# Patient Record
Sex: Female | Born: 1960 | Race: White | Hispanic: No | Marital: Married | Smoking: Never smoker
Health system: Southern US, Community
[De-identification: ages and names within clinical notes are randomized; demographics above are authoritative.]

## PROBLEM LIST (undated history)

## (undated) DIAGNOSIS — M549 Dorsalgia, unspecified: Secondary | ICD-10-CM

## (undated) DIAGNOSIS — D332 Benign neoplasm of brain, unspecified: Secondary | ICD-10-CM

## (undated) DIAGNOSIS — M7662 Achilles tendinitis, left leg: Secondary | ICD-10-CM

## (undated) DIAGNOSIS — D509 Iron deficiency anemia, unspecified: Secondary | ICD-10-CM

## (undated) DIAGNOSIS — G8929 Other chronic pain: Secondary | ICD-10-CM

## (undated) DIAGNOSIS — E669 Obesity, unspecified: Secondary | ICD-10-CM

## (undated) DIAGNOSIS — D333 Benign neoplasm of cranial nerves: Secondary | ICD-10-CM

## (undated) DIAGNOSIS — T8853XA Unintended awareness under general anesthesia during procedure, initial encounter: Secondary | ICD-10-CM

## (undated) DIAGNOSIS — T884XXA Failed or difficult intubation, initial encounter: Secondary | ICD-10-CM

## (undated) DIAGNOSIS — K219 Gastro-esophageal reflux disease without esophagitis: Secondary | ICD-10-CM

## (undated) DIAGNOSIS — D492 Neoplasm of unspecified behavior of bone, soft tissue, and skin: Secondary | ICD-10-CM

## (undated) DIAGNOSIS — T8859XA Other complications of anesthesia, initial encounter: Secondary | ICD-10-CM

## (undated) DIAGNOSIS — F988 Other specified behavioral and emotional disorders with onset usually occurring in childhood and adolescence: Secondary | ICD-10-CM

## (undated) DIAGNOSIS — R2689 Other abnormalities of gait and mobility: Secondary | ICD-10-CM

## (undated) DIAGNOSIS — K5909 Other constipation: Secondary | ICD-10-CM

## (undated) DIAGNOSIS — Z8639 Personal history of other endocrine, nutritional and metabolic disease: Secondary | ICD-10-CM

## (undated) HISTORY — DX: Other abnormalities of gait and mobility: R26.89

## (undated) HISTORY — DX: Achilles tendinitis, left leg: M76.62

## (undated) HISTORY — PX: TOE FUSION: SHX1070

## (undated) HISTORY — PX: CHOLECYSTECTOMY: SHX55

## (undated) HISTORY — PX: BACK SURGERY: SHX140

## (undated) HISTORY — DX: Personal history of other endocrine, nutritional and metabolic disease: Z86.39

## (undated) HISTORY — DX: Other specified behavioral and emotional disorders with onset usually occurring in childhood and adolescence: F98.8

## (undated) HISTORY — PX: ACHILLES TENDON REPAIR: SUR1153

## (undated) HISTORY — DX: Other chronic pain: G89.29

## (undated) HISTORY — DX: Unintended awareness under general anesthesia during procedure, initial encounter: T88.53XA

## (undated) HISTORY — PX: OVARIAN CYST REMOVAL: SHX89

## (undated) HISTORY — DX: Failed or difficult intubation, initial encounter: T88.4XXA

## (undated) HISTORY — DX: Other constipation: K59.09

## (undated) HISTORY — PX: IMPLANTATION BONE ANCHORED HEARING AID: SUR691

## (undated) HISTORY — DX: Iron deficiency anemia, unspecified: D50.9

## (undated) HISTORY — DX: Benign neoplasm of brain, unspecified: D33.2

## (undated) HISTORY — PX: ABLATION: SHX5711

## (undated) HISTORY — DX: Obesity, unspecified: E66.9

## (undated) HISTORY — DX: Dorsalgia, unspecified: M54.9

---

## 1999-09-12 ENCOUNTER — Other Ambulatory Visit: Admission: RE | Admit: 1999-09-12 | Discharge: 1999-09-12 | Payer: Self-pay | Admitting: Gynecology

## 1999-09-27 ENCOUNTER — Ambulatory Visit (HOSPITAL_COMMUNITY): Admission: RE | Admit: 1999-09-27 | Discharge: 1999-09-27 | Payer: Self-pay | Admitting: Gynecology

## 2000-03-07 ENCOUNTER — Encounter: Payer: Self-pay | Admitting: Neurosurgery

## 2000-03-07 ENCOUNTER — Ambulatory Visit (HOSPITAL_COMMUNITY): Admission: RE | Admit: 2000-03-07 | Discharge: 2000-03-07 | Payer: Self-pay | Admitting: Neurosurgery

## 2000-03-11 ENCOUNTER — Ambulatory Visit (HOSPITAL_COMMUNITY): Admission: RE | Admit: 2000-03-11 | Discharge: 2000-03-11 | Payer: Self-pay | Admitting: Neurosurgery

## 2000-03-11 ENCOUNTER — Encounter: Payer: Self-pay | Admitting: Neurosurgery

## 2000-03-15 ENCOUNTER — Ambulatory Visit (HOSPITAL_COMMUNITY): Admission: RE | Admit: 2000-03-15 | Discharge: 2000-03-15 | Payer: Self-pay | Admitting: Neurosurgery

## 2000-03-15 ENCOUNTER — Encounter: Payer: Self-pay | Admitting: Neurosurgery

## 2000-05-01 ENCOUNTER — Encounter: Admission: RE | Admit: 2000-05-01 | Discharge: 2000-07-30 | Payer: Self-pay | Admitting: Anesthesiology

## 2000-10-01 ENCOUNTER — Other Ambulatory Visit: Admission: RE | Admit: 2000-10-01 | Discharge: 2000-10-01 | Payer: Self-pay | Admitting: Gynecology

## 2001-04-24 ENCOUNTER — Other Ambulatory Visit: Admission: RE | Admit: 2001-04-24 | Discharge: 2001-04-24 | Payer: Self-pay | Admitting: Gynecology

## 2001-11-03 ENCOUNTER — Other Ambulatory Visit: Admission: RE | Admit: 2001-11-03 | Discharge: 2001-11-03 | Payer: Self-pay | Admitting: Gynecology

## 2002-07-02 ENCOUNTER — Other Ambulatory Visit: Admission: RE | Admit: 2002-07-02 | Discharge: 2002-07-02 | Payer: Self-pay | Admitting: Gynecology

## 2002-10-26 ENCOUNTER — Encounter: Admission: RE | Admit: 2002-10-26 | Discharge: 2002-10-26 | Payer: Self-pay | Admitting: Family Medicine

## 2002-10-26 ENCOUNTER — Encounter: Payer: Self-pay | Admitting: Family Medicine

## 2002-12-28 ENCOUNTER — Other Ambulatory Visit: Admission: RE | Admit: 2002-12-28 | Discharge: 2002-12-28 | Payer: Self-pay | Admitting: Gynecology

## 2003-02-02 ENCOUNTER — Ambulatory Visit (HOSPITAL_COMMUNITY): Admission: RE | Admit: 2003-02-02 | Discharge: 2003-02-02 | Payer: Self-pay | Admitting: Family Medicine

## 2003-02-02 ENCOUNTER — Encounter: Payer: Self-pay | Admitting: Family Medicine

## 2003-03-05 ENCOUNTER — Ambulatory Visit (HOSPITAL_COMMUNITY): Admission: RE | Admit: 2003-03-05 | Discharge: 2003-03-05 | Payer: Self-pay | Admitting: Gastroenterology

## 2004-02-04 ENCOUNTER — Other Ambulatory Visit: Admission: RE | Admit: 2004-02-04 | Discharge: 2004-02-04 | Payer: Self-pay | Admitting: Gynecology

## 2005-03-14 ENCOUNTER — Other Ambulatory Visit: Admission: RE | Admit: 2005-03-14 | Discharge: 2005-03-14 | Payer: Self-pay | Admitting: Gynecology

## 2005-10-15 ENCOUNTER — Other Ambulatory Visit: Admission: RE | Admit: 2005-10-15 | Discharge: 2005-10-15 | Payer: Self-pay | Admitting: Gynecology

## 2006-05-20 ENCOUNTER — Encounter: Admission: RE | Admit: 2006-05-20 | Discharge: 2006-05-20 | Payer: Self-pay | Admitting: Gynecology

## 2010-06-18 HISTORY — PX: ANAL FISSURE REPAIR: SHX2312

## 2010-07-08 ENCOUNTER — Encounter: Payer: Self-pay | Admitting: Gynecology

## 2011-11-12 ENCOUNTER — Emergency Department (INDEPENDENT_AMBULATORY_CARE_PROVIDER_SITE_OTHER)
Admission: EM | Admit: 2011-11-12 | Discharge: 2011-11-12 | Disposition: A | Discharge status: Home / Self Care | Payer: BC Managed Care – PPO | Source: Home / Self Care

## 2011-11-12 ENCOUNTER — Emergency Department
Admit: 2011-11-12 | Discharge: 2011-11-12 | Disposition: A | Discharge status: Home / Self Care | Payer: BC Managed Care – PPO

## 2011-11-12 ENCOUNTER — Encounter: Payer: Self-pay | Admitting: *Deleted

## 2011-11-12 DIAGNOSIS — S29011A Strain of muscle and tendon of front wall of thorax, initial encounter: Secondary | ICD-10-CM

## 2011-11-12 DIAGNOSIS — R0789 Other chest pain: Secondary | ICD-10-CM

## 2011-11-12 DIAGNOSIS — Q788 Other specified osteochondrodysplasias: Secondary | ICD-10-CM

## 2011-11-12 DIAGNOSIS — IMO0002 Reserved for concepts with insufficient information to code with codable children: Secondary | ICD-10-CM

## 2011-11-12 HISTORY — DX: Gastro-esophageal reflux disease without esophagitis: K21.9

## 2011-11-12 IMAGING — CR DG CHEST 2V
2 series · 2 of 2 positions shown · non-contrast
Comparison: None.

CLINICAL DATA: Tenderness over sternum

CHEST - 2 VIEW

[view not recorded (1 of 2)]
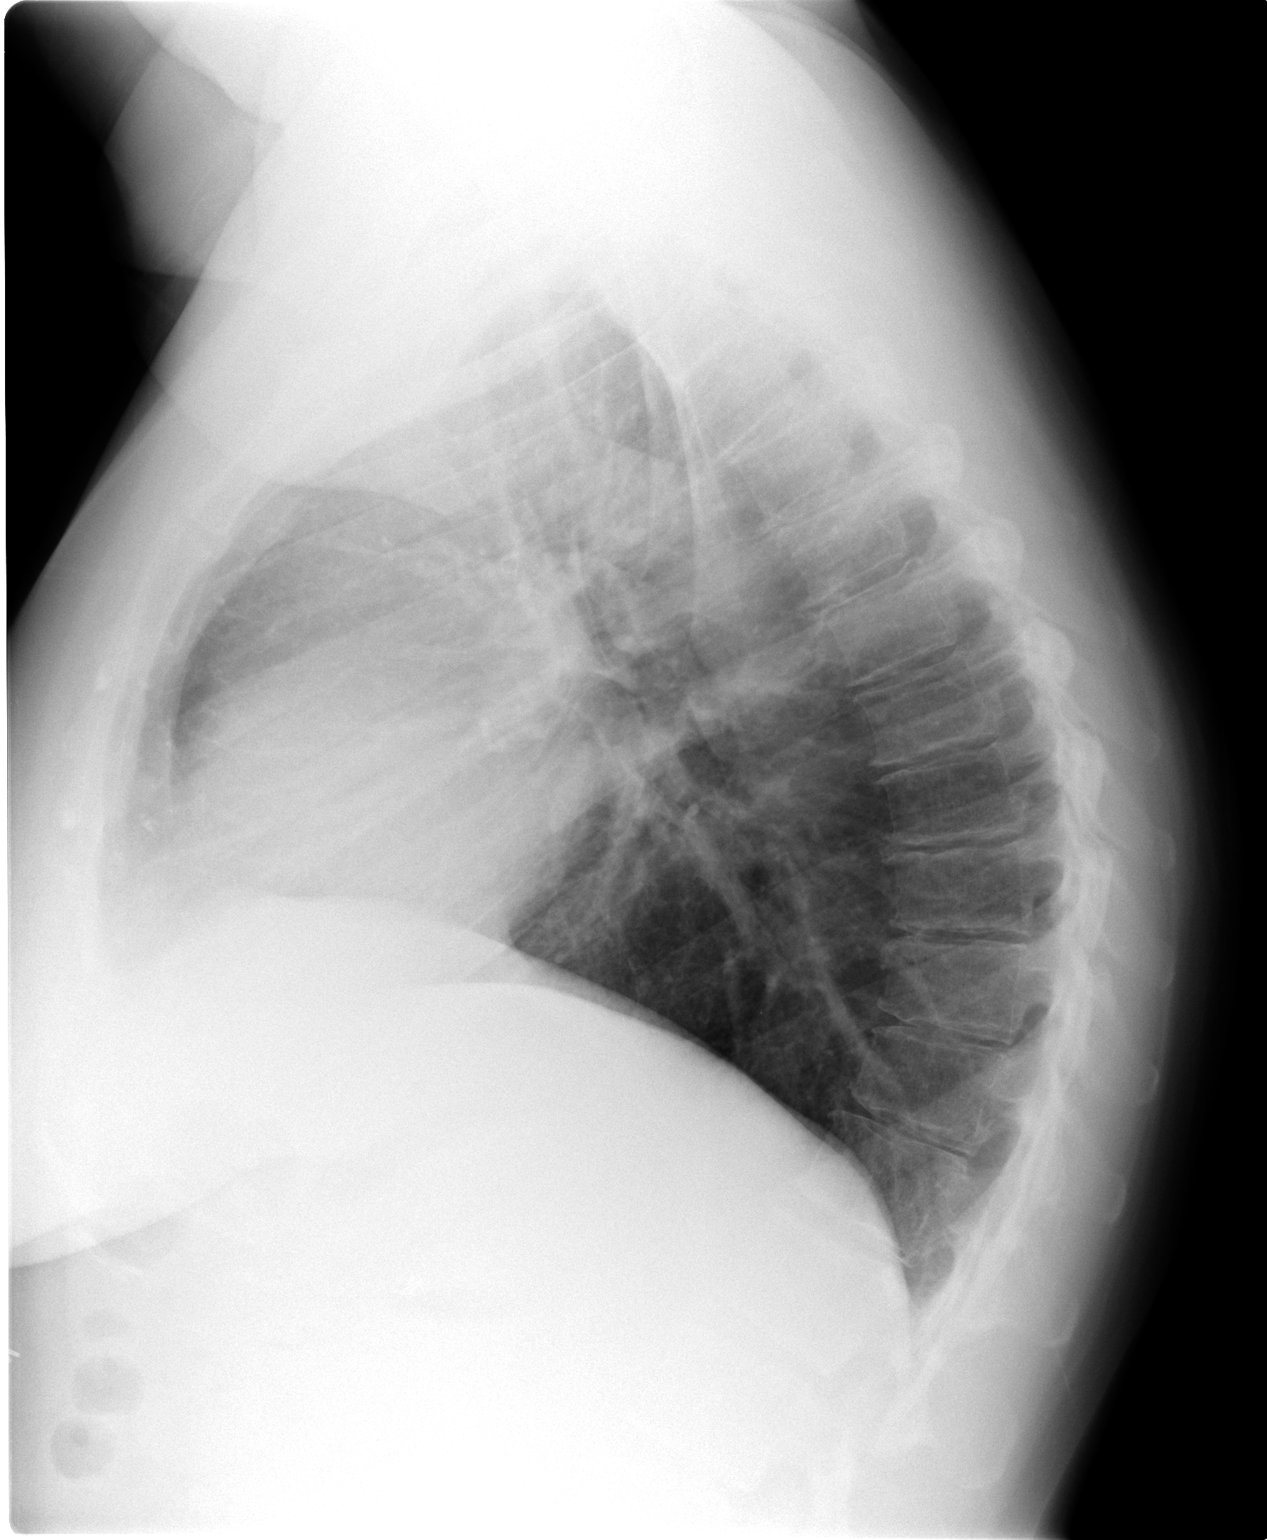

[view not recorded (2 of 2)]
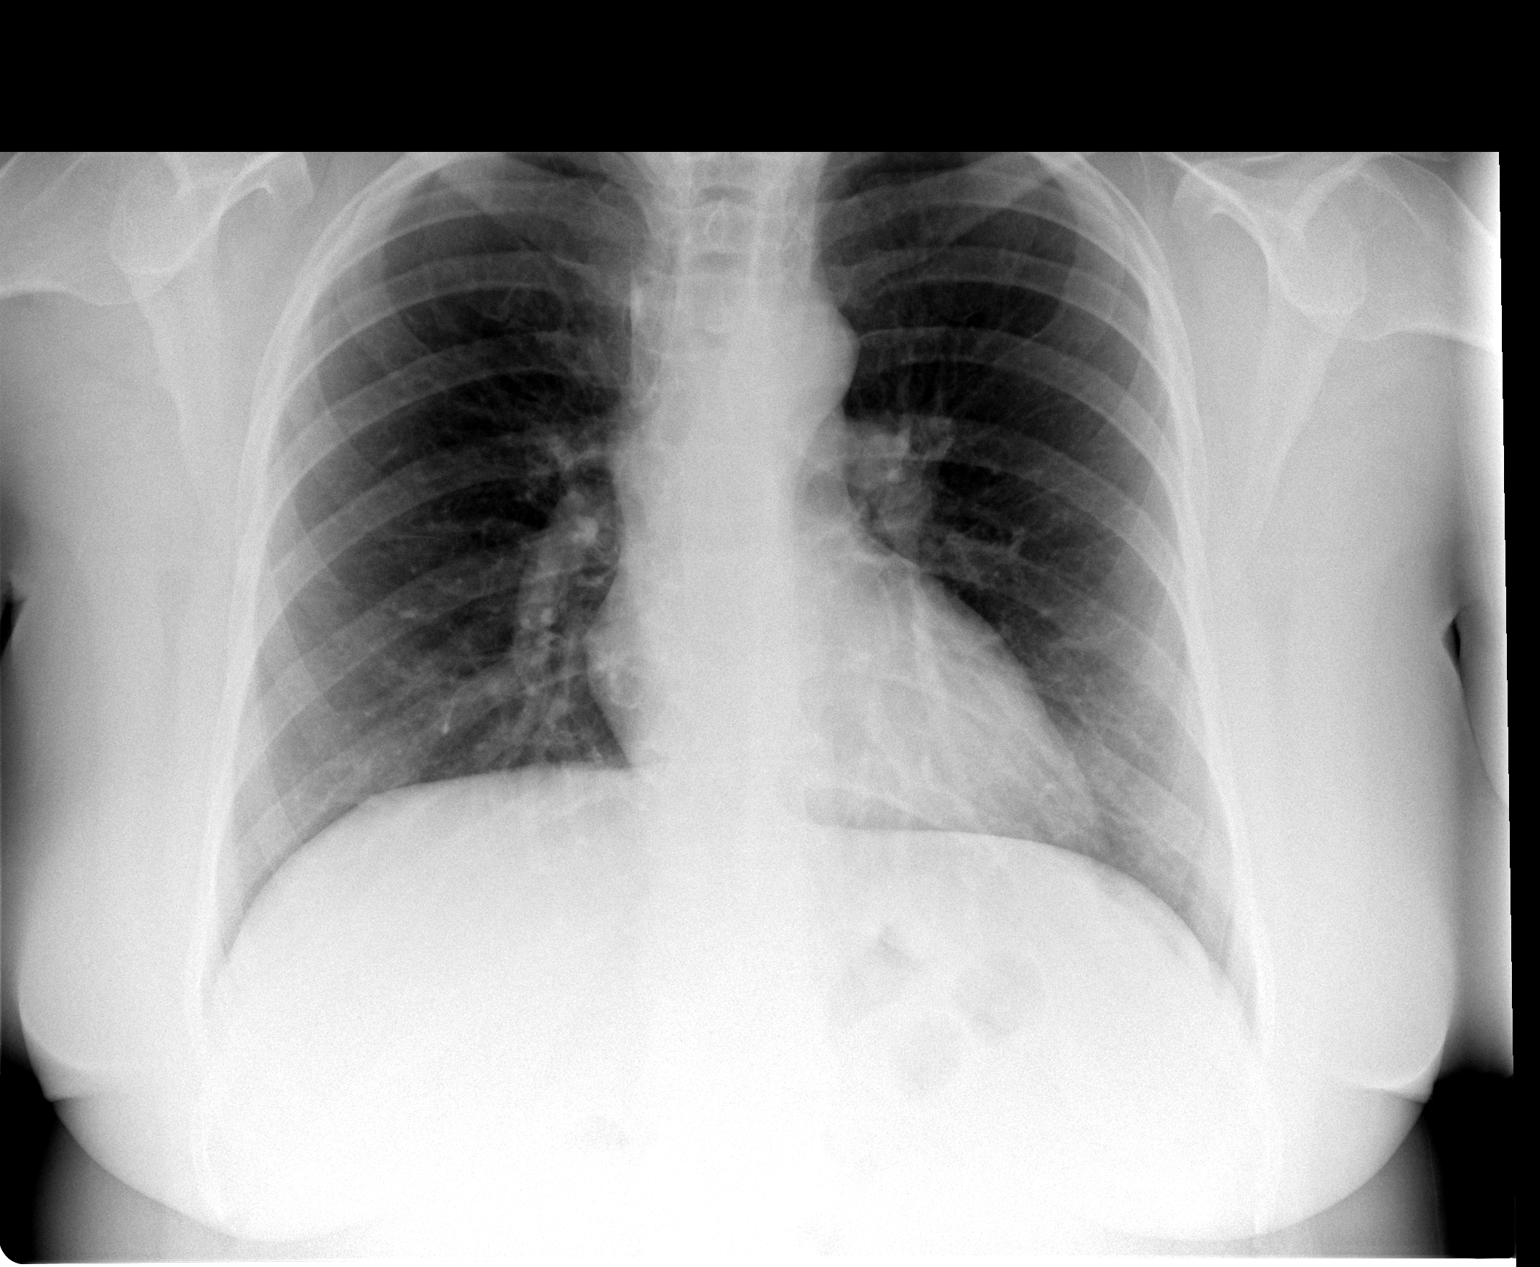

[2 of 2 positions shown; findings below may reference images not displayed]

FINDINGS: Normal heart size.  Clear lungs.  Increased AP diameter
of the chest.
IMPRESSION: No active cardiopulmonary disease.

## 2011-11-12 IMAGING — CR DG STERNUM 2+V
1 series · 1 of 1 positions shown · non-contrast
Comparison: Chest radiographs dated [DATE].

CLINICAL DATA: Tenderness over sternum

STERNUM - 1 VIEW

[view not recorded]
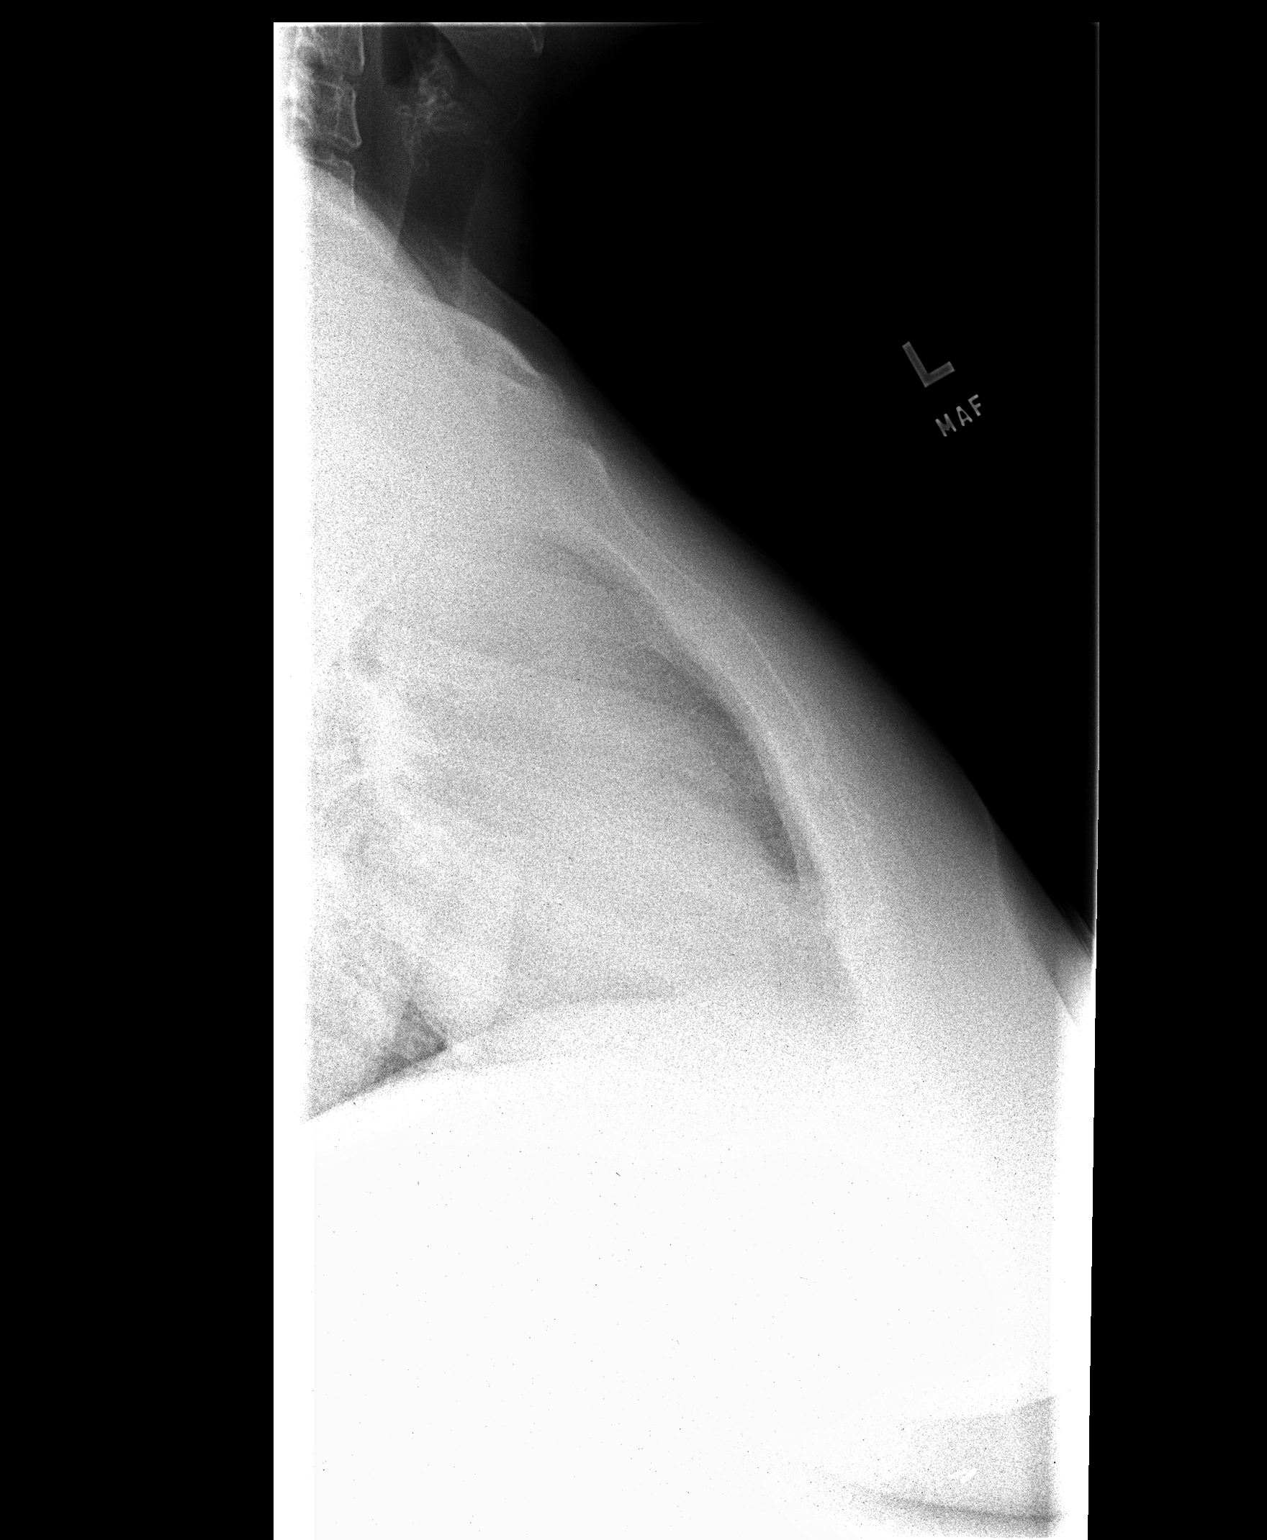

[1 of 1 positions shown; findings below may reference images not displayed]

FINDINGS: No displaced sternal fracture is seen on this single
lateral view.
IMPRESSION: No displaced sternal fracture is seen.

## 2011-11-12 MED ORDER — CYCLOBENZAPRINE HCL 10 MG PO TABS: ORAL_TABLET | ORAL | Status: DC

## 2011-11-12 NOTE — ED Notes (Signed)
Pt states that 2 days ago she stood up and felt a "pop" in her LT neck/chest area. She states that yesterday she had chest pain and "it felt funny to breath". Today she c/o SOB, nausea with the chest pain slightly radiating to her LT arm. She took a muscle relaxer last night and this AM with minimal relief.

## 2011-11-12 NOTE — ED Provider Notes (Signed)
History     CSN: [id number]  Arrival date & time      None     Chief Complaint  Patient presents with  . Chest Pain  . Neck Pain     HPI Comments: Two days while pushing herself up from a folding canvas chair, patient felt a sudden popping sensation in her left upper chest and neck.  There area has gradually become more painful.  She has pain with movement of her neck.  She has even felt shortness of breath at times.  The pain radiates into her left arm, and awakens her at night when she moves.  No nausea/vomiting.  No cough.  She is presently taking Celebrex without improvement.  Flexeril has also not been helpful although it helps her sleep at night.  No recent chest trauma or change in physical activities.  However, she participates regularly in an exercise and weight lifting class, and last week began working out every morning.  Patient is a 51 y.o. female presenting with chest pain. The history is provided by the patient.  Chest Pain The chest pain began 2 days ago. Episode Length: constant. The chest pain is worsening. Associated with: movement of neck. At its most intense, the pain is at 10/10. The severity of the pain is moderate. The quality of the pain is described as heavy and sharp. The pain radiates to the left arm. Chest pain is worsened by certain positions. Primary symptoms include shortness of breath and nausea. Pertinent negatives for primary symptoms include no fever, no fatigue, no syncope, no cough, no wheezing, no palpitations, no abdominal pain, no vomiting and no dizziness. Treatments tried: Flexeril. Risk factors include obesity.  Her past medical history is significant for diabetes.  Her family medical history is significant for heart disease in family.     Past Medical History  Diagnosis Date  . Diabetes mellitus   . GERD (gastroesophageal reflux disease)   . Asthma     Past Surgical History  Procedure Date  . Cholecystectomy   . Ovarian cyst removal   .  Back surgery   . Achilles tendon repair     Family history:  Father MI and esophageal CA.  Mother thyroid disease  History  Substance Use Topics  . Smoking status: Never Smoker   . Smokeless tobacco: Not on file  . Alcohol Use: Yes    OB History    Grav Para Term Preterm Abortions TAB SAB Ect Mult Living                  Review of Systems  Constitutional: Negative for fever and fatigue.  Respiratory: Positive for shortness of breath. Negative for cough and wheezing.   Cardiovascular: Positive for chest pain. Negative for palpitations and syncope.  Gastrointestinal: Positive for nausea. Negative for vomiting and abdominal pain.  Neurological: Negative for dizziness.    Allergies  Mobic  Home Medications   Current Outpatient Rx  Name Route Sig Dispense Refill  . CELECOXIB 200 MG PO CAPS Oral Take 200 mg by mouth 2 (two) times daily.    . DEXLANSOPRAZOLE 30 MG PO CPDR Oral Take 30 mg by mouth daily.    [name] Kitchen METFORMIN HCL 500 MG PO TABS Oral Take 500 mg by mouth 2 (two) times daily with a meal.    . TRIAMTERENE-HCTZ 37.5-25 MG PO TABS Oral Take 1 tablet by mouth daily.    . CYCLOBENZAPRINE HCL 10 MG PO TABS  Take one tab by  mouth, one to three times daily as needed 20 tablet 1    BP 142/91  Pulse 79  Temp(Src) 99.7 F (37.6 C) (Oral)  Resp 18  Ht 5\' 8"  (1.727 m)  Wt 220 lb 12 oz (100.132 kg)  BMI 33.57 kg/m2  SpO2 99%  Physical Exam Nursing notes and Vital Signs reviewed. Appearance:  Patient appears stated age, and in no acute distress Eyes:  Pupils are equal, round, and reactive to light and accomodation.  Extraocular movement is intact.  Conjunctivae are not inflamed  Nose:  Normal Pharynx:  Normal Neck:  Supple.  No adenopathy.  No thyromegaly.  Tenderness over the left sternocleidomastoid muscle. Lungs:  Clear to auscultation.  Breath sounds are equal.  Chest:  Distinct tenderness over the mid-sternum radiating into the left pectoralis muscle.  No swelling or  crepitance over sternum.  There is distinct tenderness over the left pectoralis muscle.  Palpation of the left pectoralis during resisted contraction causes left chest pain. Heart:  Regular rate and rhythm without murmurs, rubs, or gallops.  Abdomen:  Nontender without masses or hepatosplenomegaly.  Bowel sounds are present.  No CVA or flank tenderness.  Extremities:  No edema.  No calf tenderness Skin:  No rash present.   ED Course  Procedures none  Labs Reviewed - No data to display Dg Chest 2 View  11/12/2011  *RADIOLOGY REPORT*  Clinical Data: Tenderness over sternum  CHEST - 2 VIEW  Comparison: None.  Findings: Normal heart size.  Clear lungs.  Increased AP diameter of the chest.  IMPRESSION: No active cardiopulmonary disease.  Original Report Authenticated By: Donavan Burnet, M.D.   Dg Sternum  11/12/2011  *RADIOLOGY REPORT*  Clinical Data: Tenderness over sternum  STERNUM - 1 VIEW  Comparison: Chest radiographs dated 11/12/2011.  Findings: No displaced sternal fracture is seen on this single lateral view.  IMPRESSION: No displaced sternal fracture is seen.  Original Report Authenticated By: Charline Bills, M.D.   EKG:  No acute changes  1. Chest pain, musculoskeletal   2. Pectoralis muscle strain       MDM   Continue Flexeril. Apply ice pack to painful areas several times daily.  Continue Celebrex.  Decrease exercise activities involving chest muscles until improved. Followup with [facility] if not improving about two weeks.         Lattie Haw, MD 11/12/11 [phone]

## 2011-11-12 NOTE — Discharge Instructions (Signed)
Apply ice pack to painful areas several times daily.  Continue Celebrex.

## 2011-11-14 ENCOUNTER — Telehealth: Payer: Self-pay

## 2011-11-14 NOTE — ED Notes (Signed)
I called and spoke with patient and she is doing a little better. I advised to call back if anything changes or if she has questions or concerns. She will call sports medicine to make an appointment if she does not improve.

## 2013-02-04 ENCOUNTER — Telehealth: Payer: Self-pay | Admitting: Hematology & Oncology

## 2013-02-04 NOTE — Telephone Encounter (Signed)
Received referral thru epic for pt. There is no current record for pt. I left message for Tiffany to call and sent her in basket message. I also called referring office to see if they would fax chart

## 2013-02-04 NOTE — Telephone Encounter (Signed)
Pt aware of 9-3 with iron infusion. Delice Bison aware to precert

## 2013-02-18 ENCOUNTER — Other Ambulatory Visit (HOSPITAL_BASED_OUTPATIENT_CLINIC_OR_DEPARTMENT_OTHER): Payer: BC Managed Care – PPO | Admitting: Lab

## 2013-02-18 ENCOUNTER — Ambulatory Visit: Payer: BC Managed Care – PPO

## 2013-02-18 ENCOUNTER — Ambulatory Visit (HOSPITAL_BASED_OUTPATIENT_CLINIC_OR_DEPARTMENT_OTHER): Payer: BC Managed Care – PPO | Admitting: Hematology & Oncology

## 2013-02-18 ENCOUNTER — Encounter: Payer: Self-pay | Admitting: Hematology & Oncology

## 2013-02-18 VITALS — BP 126/80 | HR 67 | Temp 97.9°F | Resp 16 | Ht 68.0 in | Wt 214.0 lb

## 2013-02-18 DIAGNOSIS — D508 Other iron deficiency anemias: Secondary | ICD-10-CM

## 2013-02-18 DIAGNOSIS — D509 Iron deficiency anemia, unspecified: Secondary | ICD-10-CM

## 2013-02-18 HISTORY — DX: Iron deficiency anemia, unspecified: D50.9

## 2013-02-18 LAB — CBC WITH DIFFERENTIAL (CANCER CENTER ONLY)
BASO#: 0 10*3/uL (ref 0.0–0.2)
BASO%: 0.1 % (ref 0.0–2.0)
EOS%: 2.4 % (ref 0.0–7.0)
Eosinophils Absolute: 0.2 10*3/uL (ref 0.0–0.5)
HCT: 36.7 % (ref 34.8–46.6)
HGB: 12.4 g/dL (ref 11.6–15.9)
LYMPH#: 1.8 10*3/uL (ref 0.9–3.3)
LYMPH%: 26.5 % (ref 14.0–48.0)
MCH: 27 pg (ref 26.0–34.0)
MCHC: 33.8 g/dL (ref 32.0–36.0)
MCV: 80 fL — ABNORMAL LOW (ref 81–101)
MONO#: 0.5 10*3/uL (ref 0.1–0.9)
MONO%: 6.6 % (ref 0.0–13.0)
NEUT#: 4.4 10*3/uL (ref 1.5–6.5)
NEUT%: 64.4 % (ref 39.6–80.0)
Platelets: 174 10*3/uL (ref 145–400)
RBC: 4.59 10*6/uL (ref 3.70–5.32)
RDW: 12.8 % (ref 11.1–15.7)
WBC: 6.8 10*3/uL (ref 3.9–10.0)

## 2013-02-18 LAB — IRON AND TIBC CHCC
%SAT: 13 % — ABNORMAL LOW (ref 21–57)
Iron: 50 ug/dL (ref 41–142)
TIBC: 388 ug/dL (ref 236–444)
UIBC: 338 ug/dL (ref 120–384)

## 2013-02-18 LAB — FERRITIN CHCC: Ferritin: 38 ng/ml (ref 9–269)

## 2013-02-18 NOTE — Progress Notes (Signed)
This office note has been dictated.

## 2013-02-19 ENCOUNTER — Telehealth: Payer: Self-pay | Admitting: Hematology & Oncology

## 2013-02-19 NOTE — Progress Notes (Signed)
CC:   Gildardo Griffes, MD Anselmo Rod, MD, [facility]  DIAGNOSIS:  Iron-deficiency anemia.  HISTORY OF PRESENT ILLNESS:  [name] is a very nice 52 year old white female.  She works for a [employer].  She does IT work for them.  She has been having issues with iron deficiency.  She has been seen by Dr. Anselmo Rod.  She has had upper and lower endoscopies.  So far, nothing has really been found that would suggest any GI source of blood loss.  She did have I think heavy monthly cycles back when she had them.  She has had uterine ablation.  Back in early August she had lab work done which showed a white cell count of 7.5, hemoglobin 13.1, hematocrit 37.9, and platelet count was 203.  MCV was 77.  She had normal LFTs.  TSH was normal.  Iron studies showed a ferritin of 6 with a total iron of 34.  She was told that she needed to see a hematologist because of the low iron issues.  She said that she started a diet about a year or so ago because of being overweight.  She said she has lost 80 pounds in a year.  She has had multiple surgeries.  She has had no problems with excessive bleeding with surgeries.  She does state some reflux.  She does have some constipation and irritable bowel issues.  She has not noticed any kind of rashes.  There has been no leg swelling. She has had no fever.  There has been no sweats.  She has had no headaches.  There has been no dysphagia or odynophagia.  She has had no mouth sores.  There has been no pain with her tongue.  She has had no "craving."  She does not chew ice a lot.  PAST MEDICAL HISTORY:  Remarkable for: 1. Sphincterotomy for anal fissure at [facility] in April 2012. 2. Laparoscopic cholecystectomy at [facility]. 3. Lumbar spine diskectomy at L5-S1 at [facility]. 4. Hypertension. 5. Acid reflux. 6. Sleep apnea. 7. Arthritis.  ALLERGIES:  Mobic.  MEDICATIONS:  Albuterol inhaler 2 puffs q.6 hours p.r.n.  Tessalon Perles 200 mg  p.o. b.i.d. p.r.n., Celebrex 200 mg p.o. daily., Kapidex 30 mg p.o. daily, Linzess 290 mcg p.o. daily, Glucophage 500 mg p.o. b.i.d., Maxzide (37.5/25) 1 p.o. daily.  SOCIAL HISTORY:  Negative for tobacco use.  There is really no alcohol use.  She has no obvious occupational exposures.  FAMILY HISTORY:  Remarkable for father who had esophageal cancer.  A paternal grandfather had lung cancer and stomach cancer.  REVIEW OF SYSTEMS:  As stated in history of present illness.  PHYSICAL EXAMINATION:  General:  This is a well-developed, well- nourished, white female in no obvious distress.  Vital signs: Temperature of 97.9, pulse 67, respiratory rate 16, blood pressure 126/80, weight is 214 pounds.  Head and neck:  Normocephalic, atraumatic skull.  There are no ocular or oral lesions.  There are no palpable cervical or supraclavicular lymph nodes.  Lungs:  Clear bilaterally. Cardiac:  Regular rate and rhythm with a normal S1, S2.  There are no murmurs, rubs or bruits.  Abdomen:  Soft.  She has good bowel sounds. There is no fluid wave.  There is no palpable hepatosplenomegaly.  She has well-healed laparoscopy scars from her multiple surgeries.  Back: Shows a laminectomy scar in the lumbar spine.  No tenderness is noted over the spine, ribs, or hips.  Extremities:  Shows no  clubbing, cyanosis or edema.  Neurological:  Shows no focal neurological deficits. Skin:  No rashes, ecchymosis, or petechia.  LABORATORY STUDIES:  White cell count is 6.8, hemoglobin 12.4, hematocrit 36.7, platelet count 174.  MCV is 80.  Peripheral smear shows a mild anisocytosis and poikilocytosis.  She has some microcytic red cells.  I do not see any real target cells.  There are no nucleated red blood cells.  There is no rouleaux formation. White cells appear normal in morphology and maturation.  There are no immature myeloid or lymphoid forms.  I see no blasts.  Platelets are adequate in number and  size.  IMPRESSION:  [name] is a very nice 52 year old white female with history of iron-deficiency anemia.  Her iron is still on the low side. Again, it is hard to say if she is just not absorbing all that well.  She cannot take oral iron.  She says that this really constipates her.  She still feels fatigued.  I suspect that she probably is still iron deficient and that she is going to need IV iron. We will see about getting her in to get this taken care of.  I believe that IV iron will be able to help her out.  We will go ahead and try to get her in next week.  I probably do not need to see her back unless she has any issues.  I think with a dose of IV iron, we can get her iron stores up and get her feeling better.  I spent a good hour with [name].  She is very nice.  She is very interesting to talk to.    ______________________________ Josph Macho, M.D. PRE/MEDQ  D:  02/18/2013  T:  02/19/2013  Job:  [id number]

## 2013-02-19 NOTE — Telephone Encounter (Signed)
Pt aware of 9-8 iron

## 2013-02-19 NOTE — Telephone Encounter (Signed)
Left pt message to call and schedule appointment for next week.

## 2013-02-20 LAB — HEMOGLOBINOPATHY EVALUATION
Hemoglobin Other: 0 %
Hgb A2 Quant: 2.6 % (ref 2.2–3.2)
Hgb A: 97.4 % (ref 96.8–97.8)
Hgb F Quant: 0 % (ref 0.0–2.0)
Hgb S Quant: 0 %

## 2013-02-20 LAB — RETICULOCYTES (CHCC)
ABS Retic: 42 10*3/uL (ref 19.0–186.0)
RBC.: 4.67 MIL/uL (ref 3.87–5.11)
Retic Ct Pct: 0.9 % (ref 0.4–2.3)

## 2013-02-23 ENCOUNTER — Ambulatory Visit (HOSPITAL_BASED_OUTPATIENT_CLINIC_OR_DEPARTMENT_OTHER): Payer: BC Managed Care – PPO

## 2013-02-23 VITALS — BP 136/86 | HR 78 | Temp 97.2°F | Resp 16

## 2013-02-23 DIAGNOSIS — D509 Iron deficiency anemia, unspecified: Secondary | ICD-10-CM

## 2013-02-23 DIAGNOSIS — D508 Other iron deficiency anemias: Secondary | ICD-10-CM

## 2013-02-23 MED ORDER — SODIUM CHLORIDE 0.9 % IV SOLN
1020.0000 mg | Freq: Once | INTRAVENOUS | Status: AC
  Administered 2013-02-23: 1020 mg via INTRAVENOUS
  Filled 2013-02-23: qty 34

## 2013-02-23 MED ORDER — SODIUM CHLORIDE 0.9 % IJ SOLN
10.0000 mL | INTRAMUSCULAR | Status: DC | PRN
  Filled 2013-02-23: qty 10

## 2013-02-23 MED ORDER — HEPARIN SOD (PORK) LOCK FLUSH 100 UNIT/ML IV SOLN
500.0000 [IU] | Freq: Once | INTRAVENOUS | Status: DC | PRN
  Filled 2013-02-23: qty 5

## 2013-02-23 MED ORDER — SODIUM CHLORIDE 0.9 % IV SOLN
Freq: Once | INTRAVENOUS | Status: AC
  Administered 2013-02-23: 11:00:00 via INTRAVENOUS

## 2013-02-23 MED ORDER — ALTEPLASE 2 MG IJ SOLR
2.0000 mg | Freq: Once | INTRAMUSCULAR | Status: DC | PRN
  Filled 2013-02-23: qty 2

## 2013-02-23 MED ORDER — HEPARIN SOD (PORK) LOCK FLUSH 100 UNIT/ML IV SOLN
250.0000 [IU] | Freq: Once | INTRAVENOUS | Status: DC | PRN
  Filled 2013-02-23: qty 5

## 2013-02-23 MED ORDER — SODIUM CHLORIDE 0.9 % IJ SOLN
3.0000 mL | Freq: Once | INTRAMUSCULAR | Status: DC | PRN
  Filled 2013-02-23: qty 10

## 2013-02-23 NOTE — Patient Instructions (Signed)
Ferumoxytol injection What is this medicine? FERUMOXYTOL is an iron complex. Iron is used to make healthy red blood cells, which carry oxygen and nutrients throughout the body. This medicine is used to treat iron deficiency anemia in people with chronic kidney disease. This medicine may be used for other purposes; ask your health care provider or pharmacist if you have questions. What should I tell my health care provider before I take this medicine? They need to know if you have any of these conditions: -anemia not caused by low iron levels -high levels of iron in the blood -magnetic resonance imaging (MRI) test scheduled -an unusual or allergic reaction to iron, other medicines, foods, dyes, or preservatives -pregnant or trying to get pregnant -breast-feeding How should I use this medicine? This medicine is for infusion into a vein. It is given by a health care professional in a hospital or clinic setting. Talk to your pediatrician regarding the use of this medicine in children. Special care may be needed. Overdosage: If you think you've taken too much of this medicine contact a poison control center or emergency room at once. Overdosage: If you think you have taken too much of this medicine contact a poison control center or emergency room at once. NOTE: This medicine is only for you. Do not share this medicine with others. What if I miss a dose? It is important not to miss your dose. Call your doctor or health care professional if you are unable to keep an appointment. What may interact with this medicine? This medicine may interact with the following medications: -other iron products This list may not describe all possible interactions. Give your health care provider a list of all the medicines, herbs, non-prescription drugs, or dietary supplements you use. Also tell them if you smoke, drink alcohol, or use illegal drugs. Some items may interact with your medicine. What should I watch  for while using this medicine? Visit your doctor or healthcare professional regularly. Tell your doctor or healthcare professional if your symptoms do not start to get better or if they get worse. You may need blood work done while you are taking this medicine. You may need to follow a special diet. Talk to your doctor. Foods that contain iron include: whole grains/cereals, dried fruits, beans, or peas, leafy green vegetables, and organ meats (liver, kidney). What side effects may I notice from receiving this medicine? Side effects that you should report to your doctor or health care professional as soon as possible: -allergic reactions like skin rash, itching or hives, swelling of the face, lips, or tongue -breathing problems -changes in blood pressure -feeling faint or lightheaded, falls -fever or chills -flushing, sweating, or hot feelings -swelling of the ankles or feet Side effects that usually do not require medical attention (Report these to your doctor or health care professional if they continue or are bothersome.): -diarrhea -headache -nausea, vomiting -stomach pain This list may not describe all possible side effects. Call your doctor for medical advice about side effects. You may report side effects to FDA at 1-800-FDA-1088. Where should I keep my medicine? This drug is given in a hospital or clinic and will not be stored at home. NOTE: This sheet is a summary. It may not cover all possible information. If you have questions about this medicine, talk to your doctor, pharmacist, or health care provider.  2013, [employer]vier/[employer]. (02/25/2008 9:48:25 PM)  

## 2013-04-14 DIAGNOSIS — M792 Neuralgia and neuritis, unspecified: Secondary | ICD-10-CM | POA: Insufficient documentation

## 2013-04-16 ENCOUNTER — Ambulatory Visit (INDEPENDENT_AMBULATORY_CARE_PROVIDER_SITE_OTHER): Payer: BC Managed Care – PPO | Admitting: Obstetrics & Gynecology

## 2013-04-16 ENCOUNTER — Encounter: Payer: Self-pay | Admitting: Obstetrics & Gynecology

## 2013-04-16 VITALS — BP 138/90 | HR 60 | Resp 16 | Ht 67.5 in | Wt 215.0 lb

## 2013-04-16 DIAGNOSIS — N951 Menopausal and female climacteric states: Secondary | ICD-10-CM

## 2013-04-16 DIAGNOSIS — Z862 Personal history of diseases of the blood and blood-forming organs and certain disorders involving the immune mechanism: Secondary | ICD-10-CM

## 2013-04-16 DIAGNOSIS — Z124 Encounter for screening for malignant neoplasm of cervix: Secondary | ICD-10-CM

## 2013-04-16 DIAGNOSIS — Z01419 Encounter for gynecological examination (general) (routine) without abnormal findings: Secondary | ICD-10-CM

## 2013-04-16 DIAGNOSIS — N9489 Other specified conditions associated with female genital organs and menstrual cycle: Secondary | ICD-10-CM

## 2013-04-16 DIAGNOSIS — Z0000 Encounter for general adult medical examination without abnormal findings: Secondary | ICD-10-CM

## 2013-04-16 LAB — POCT URINALYSIS DIPSTICK
Bilirubin, UA: NEGATIVE
Blood, UA: NEGATIVE
Glucose, UA: NEGATIVE
Ketones, UA: NEGATIVE
Leukocytes, UA: NEGATIVE
Nitrite, UA: NEGATIVE
Protein, UA: NEGATIVE
Urobilinogen, UA: NEGATIVE
pH, UA: 5

## 2013-04-16 LAB — IRON: Iron: 75 ug/dL (ref 42–145)

## 2013-04-16 LAB — FERRITIN: Ferritin: 153 ng/mL (ref 10–291)

## 2013-04-16 MED ORDER — METRONIDAZOLE 500 MG PO TABS: 500.0000 mg | ORAL_TABLET | Freq: Two times a day (BID) | ORAL | Status: DC

## 2013-04-16 MED ORDER — FLUCONAZOLE 150 MG PO TABS: ORAL_TABLET | ORAL | Status: DC

## 2013-04-16 NOTE — Patient Instructions (Signed)
Douching:  Mix hydrogen peroxide with water 1:1 and use 2-3 times weekly.

## 2013-04-16 NOTE — Progress Notes (Signed)
52 y.o. G0P0000 MarriedCaucasianF here for annual exam.  Long hx of menorrhagia.  Also hx of infertility.  Ended up having an ablation about 7 years ago.  Had bladder sling at the same time.  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  Just basically being treated.  Vaginal cream doesn't seem to help.  Oral antibiotics are what help.    No menopausal symptoms.    Seeing Dr. Myna Hidalgo due to iron deficiency.  Has had one iron infusion on 02/18/13.    No LMP recorded. Patient has had an ablation.          Sexually active: yes  The current method of family planning is vasectomy.    Exercising: yes  bootcamp and kickbox Smoker:  no  Health Maintenance: Pap:  03/29/11 WNL/negative HR HPV History of abnormal Pap:  yes MMG:  06/07/12 normal Colonoscopy:  2012 repeat in 5 years, Dr. Loreta Ave BMD:   none TDaP:  2012 or 2013 Screening Labs: today, Hb today: today, Urine today: negative   reports that she has never smoked. She has never used smokeless tobacco. She reports that she drinks about 0.5 ounces of alcohol per week. She reports that she does not use illicit drugs.  Past Medical History  Diagnosis Date  . Diabetes mellitus   . GERD (gastroesophageal reflux disease)   . Asthma   . Iron deficiency anemia, unspecified 02/18/2013  . Constipation, chronic     IBS, [facility]IC    Past Surgical History  Procedure Laterality Date  . Cholecystectomy    . Ovarian cyst removal      x4  . Back surgery    . Achilles tendon repair      x2  . Ablation      and bladder sling  . Iron infusion    . Anal fissure repair  2012    Current Outpatient Prescriptions  Medication Sig Dispense Refill  . celecoxib (CELEBREX) 200 MG capsule Take 200 mg by mouth daily.       [name] Kitchen Dexlansoprazole (KAPIDEX) 30 MG capsule Take 30 mg by mouth daily.      [name] Kitchen docusate sodium (COLACE) 100 MG capsule Take 100 mg by mouth. Every other day      . LINZESS 290 MCG CAPS capsule Take 290 mcg by mouth daily.       .  metFORMIN (GLUCOPHAGE) 500 MG tablet Take 500 mg by mouth 2 (two) times daily with a meal.      . triamterene-hydrochlorothiazide (MAXZIDE-25) 37.5-25 MG per tablet Take 1 tablet by mouth daily.      [name] Kitchen albuterol (PROVENTIL HFA;VENTOLIN HFA) 108 (90 BASE) MCG/ACT inhaler Inhale 2 puffs into the lungs as needed for wheezing.      . benzonatate (TESSALON) 200 MG capsule Take 200 mg by mouth 2 (two) times daily as needed.        No current facility-administered medications for this visit.    Family History  Problem Relation Age of Onset  . Thyroid disease Mother   . Heart attack Father   . Cancer Father     esophageal  . Lung cancer Paternal Grandfather   . Heart disease Other     paternal and maternal side  . Heart Problems Mother     vavle issue  . Dystonia Mother   . Hypercholesterolemia Mother     ROS:  Pertinent items are noted in HPI.  Otherwise, a comprehensive ROS was negative.  Exam:   BP 138/90  Pulse  60  Resp 16  Ht 5' 7.5" (1.715 m)  Wt 215 lb (97.523 kg)  BMI 33.16 kg/m2     Height: 5' 7.5" (171.5 cm)  Ht Readings from Last 3 Encounters:  04/16/13 5' 7.5" (1.715 m)  02/18/13 5\' 8"  (1.727 m)  11/12/11 5\' 8"  (1.727 m)    General appearance: alert, cooperative and appears stated age Head: Normocephalic, without obvious abnormality, atraumatic Neck: no adenopathy, supple, symmetrical, trachea midline and thyroid normal to inspection and palpation Lungs: clear to auscultation bilaterally Breasts: normal appearance, no masses or tenderness Heart: regular rate and rhythm Abdomen: soft, non-tender; bowel sounds normal; no masses,  no organomegaly Extremities: extremities normal, atraumatic, no cyanosis or edema Skin: Skin color, texture, turgor normal. No rashes or lesions Lymph nodes: Cervical, supraclavicular, and axillary nodes normal. No abnormal inguinal nodes palpated Neurologic: Grossly normal   Pelvic: External genitalia:  no lesions              Urethra:   normal appearing urethra with no masses, tenderness or lesions              Bartholins and Skenes: normal                 Vagina: normal appearing vagina with normal color and discharge, no lesions              Cervix: no lesions              Pap taken: yes Bimanual Exam:  Uterus:  normal size, contour, position, consistency, mobility, non-tender              Adnexa: normal adnexa and no mass, fullness, tenderness               Rectovaginal: Confirms               Anus:  normal sphincter tone, no lesions  A:  Well Woman with normal exam 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  Records in [facility].  Pap only today. If has recurrent issue with vaginal discharge, prefer pt see ME!! Flagyl 500mg  bid x 7 days, Dilflucan if needed Iron and ferritin FSH Release of records from Dr. Loreta Ave return annually or prn  An After Visit Summary was printed and given to the patient.

## 2013-04-17 LAB — FOLLICLE STIMULATING HORMONE: FSH: 92.6 m[IU]/mL

## 2013-04-17 LAB — IPS PAP SMEAR ONLY

## 2013-04-23 ENCOUNTER — Other Ambulatory Visit: Payer: Self-pay

## 2013-04-27 ENCOUNTER — Telehealth: Payer: Self-pay

## 2013-04-27 NOTE — Addendum Note (Signed)
Addended by: Jerene Bears on: 04/27/2013 09:17 PM   Modules accepted: Orders

## 2013-04-27 NOTE — Telephone Encounter (Signed)
Patient notified of all results. Patient has appointment for 2015//kn

## 2013-04-27 NOTE — Telephone Encounter (Signed)
Message copied by Elisha Headland on Mon Apr 27, 2013  2:19 PM ------      Message from: Jerene Bears      Created: Wed Apr 22, 2013  6:10 AM       Please inform iron levels all good.  FSH shows clear menopause.  So, if she has any bleeding she needs to let us know.  02 pap recall. ------

## 2013-04-27 NOTE — Telephone Encounter (Signed)
Lmtcb//kn 

## 2013-04-30 ENCOUNTER — Telehealth: Payer: Self-pay | Admitting: *Deleted

## 2013-04-30 NOTE — Telephone Encounter (Signed)
Patient returned call and PUS scheduled for 05-07-13.

## 2013-04-30 NOTE — Telephone Encounter (Signed)
Message copied by Alisa Graff on Thu Apr 30, 2013  9:37 AM ------      Message from: Jerene Bears      Created: Mon Apr 27, 2013  9:16 PM       I think as I am just getting to know her as a patient, should probably plan PUS.  I will place order and CC this to [facility].  Hope Gruber's office is where I go and I have even seen the PA there.  Please give her their number. ------

## 2013-04-30 NOTE — Telephone Encounter (Signed)
Call to patient to check on pain and schedule PUS, LMTCB.

## 2013-05-07 ENCOUNTER — Ambulatory Visit (INDEPENDENT_AMBULATORY_CARE_PROVIDER_SITE_OTHER): Payer: BC Managed Care – PPO | Admitting: Obstetrics & Gynecology

## 2013-05-07 ENCOUNTER — Ambulatory Visit (INDEPENDENT_AMBULATORY_CARE_PROVIDER_SITE_OTHER): Payer: BC Managed Care – PPO

## 2013-05-07 VITALS — BP 116/80 | HR 70 | Resp 14 | Ht 67.5 in | Wt 217.0 lb

## 2013-05-07 DIAGNOSIS — N9489 Other specified conditions associated with female genital organs and menstrual cycle: Secondary | ICD-10-CM

## 2013-05-07 DIAGNOSIS — D251 Intramural leiomyoma of uterus: Secondary | ICD-10-CM

## 2013-05-07 DIAGNOSIS — R1031 Right lower quadrant pain: Secondary | ICD-10-CM

## 2013-05-07 NOTE — Progress Notes (Signed)
52 y.o.Marriedfemale here for a pelvic ultrasound due to RLQ pain.  Patient reports similar pain about 10 years ago.  This turned out to be a back issue.  She was much heavier at that time and reports that was a very emotionally hard experience as she felt "blown off" and her provider then kept attributing it to her weight.  She ended up with an unnecessary laparoscopy to prove the pain wasn't gyn in origin until she was finally taken seriously.  She has an MRI and ended up with back surgery.  Pt did not have any injuries at that time.  She cannot attribute this to anything specific either.  She does report there is an accompanying pain that is in her right leg especially when sitting in her car for long periods of time.  Has PCP appointment Monday.  Is sort of hoping something will show up in the ultrasound as she really doesn't want to have to have another MRI.  Back surgeon was at [facility].  No LMP recorded. Patient has had an ablation.  Sexually active:  yes  Contraception: post menopausal  FINDINGS: UTERUS: 5.0 x 3.5 x 2.5cm with small 9mm intramural fibroid EMS: 4.78mm ADNEXA:   Left ovary 1.4 x 1.0 x 0.7cm, was difficult to find due to discomfort.   Right ovary 2.2 x 1.7 x 1.0cm CUL DE SAC: negative for free fluid  Images shown to patient and findings discussed.  Fibroid is way to small to be causing any problems and ovaries are completely normal.    Discussed with pt other possible causes--GI or urinary or musculoskeletal.  No recent urinary changes.  No bowel changes.  She feels like now is probably back related.  Has appt with PCP Monday and will review with him (Dr. Lawerance Bach).  Pt is to let me know if anything changes or if I can help in anyway.  With today's ultrasound, I am very doubtful of gyn cause.  Assessment:  RLQ pain, small intramural fibroid, o/w normal gyn u/s Plan: She will F/U with Dr. Lawerance Bach Monday.  I feel she needs to have low back MRI again.  Pt knows to call if anything changes,  if she has bleeding, or if I can help in anyway.  ~15 minutes spent with patient >50% of time was in face to face discussion of above.

## 2013-05-10 ENCOUNTER — Encounter: Payer: Self-pay | Admitting: Obstetrics & Gynecology

## 2013-05-10 NOTE — Patient Instructions (Signed)
Please call if I can help or if you have any changes or bleeding.

## 2013-06-18 HISTORY — PX: MOUTH SURGERY: SHX715

## 2013-07-16 ENCOUNTER — Ambulatory Visit (INDEPENDENT_AMBULATORY_CARE_PROVIDER_SITE_OTHER): Payer: BC Managed Care – PPO | Admitting: Family Medicine

## 2013-07-16 ENCOUNTER — Encounter: Payer: Self-pay | Admitting: Family Medicine

## 2013-07-16 VITALS — BP 124/81 | HR 76 | Resp 16 | Ht 67.0 in | Wt 214.0 lb

## 2013-07-16 DIAGNOSIS — R062 Wheezing: Secondary | ICD-10-CM

## 2013-07-16 DIAGNOSIS — M7989 Other specified soft tissue disorders: Secondary | ICD-10-CM

## 2013-07-16 DIAGNOSIS — E669 Obesity, unspecified: Secondary | ICD-10-CM

## 2013-07-16 DIAGNOSIS — M549 Dorsalgia, unspecified: Secondary | ICD-10-CM

## 2013-07-16 DIAGNOSIS — R7301 Impaired fasting glucose: Secondary | ICD-10-CM

## 2013-07-16 LAB — POCT GLYCOSYLATED HEMOGLOBIN (HGB A1C): Hemoglobin A1C: 4.9

## 2013-07-16 MED ORDER — PHENTERMINE-TOPIRAMATE ER 7.5-46 MG PO CP24: 1.0000 | ORAL_CAPSULE | Freq: Every day | ORAL | Status: DC

## 2013-07-16 MED ORDER — TRIAMTERENE-HCTZ 37.5-25 MG PO TABS: ORAL_TABLET | ORAL | Status: DC

## 2013-07-16 MED ORDER — PHENTERMINE-TOPIRAMATE ER 3.75-23 MG PO CP24: 1.0000 | ORAL_CAPSULE | Freq: Every day | ORAL | Status: DC

## 2013-07-16 MED ORDER — ALBUTEROL SULFATE HFA 108 (90 BASE) MCG/ACT IN AERS
2.0000 | INHALATION_SPRAY | Freq: Four times a day (QID) | RESPIRATORY_TRACT | Status: DC | PRN
Start: 2013-07-16 — End: 2015-02-11

## 2013-07-16 MED ORDER — CELECOXIB 200 MG PO CAPS: 200.0000 mg | ORAL_CAPSULE | Freq: Two times a day (BID) | ORAL | Status: AC

## 2013-07-16 NOTE — Progress Notes (Signed)
Subjective:    Patient ID: Cynthia [name], female    DOB: December 30, 1960, 53 y.o.   MRN: [id number]  HPI  Cynthia [name] is here today to establish care with our practice.  She was referred to [place] by her friend Best boy).  She previously received her care from Dr. Quay Burow at [facility] but needs to find a new provider since he has left that practice and she wants a PCP closer to her home.  Dr. Quay Burow had recently ordered some labs that she would like to discuss.  She would like to discuss the conditions listed below:    1)  Obesity - She has struggled with this problem for years.  Her heaviest weight was 296 lbs three years ago.  She has currently exercising at least five times per week and her diet is low fat and low carb but she has not been able to lose weight.    2)  IFG:  She has had elevated blood sugars in the past.  She asked her former doctor for an A1C recheck but he refused.  She currently takes Metformin 500 mg twice daily.     Review of Systems  Constitutional: Negative for activity change, appetite change, fatigue and unexpected weight change.  HENT: Negative.   Eyes: Negative.   Respiratory: Negative for chest tightness and shortness of breath.   Cardiovascular: Negative for chest pain, palpitations and leg swelling.  Gastrointestinal: Negative for diarrhea and constipation.  Endocrine: Negative.  Negative for polydipsia, polyphagia and polyuria.  Genitourinary: Negative for urgency, frequency and difficulty urinating.  Musculoskeletal: Negative.   Skin: Negative.   Neurological: Negative.  Negative for light-headedness and numbness.  Hematological: Negative for adenopathy. Does not bruise/bleed easily.  Psychiatric/Behavioral: Negative for sleep disturbance and dysphoric mood. The patient is not nervous/anxious.   All other systems reviewed and are negative.     Past Medical History  Diagnosis Date  . Diabetes mellitus   . GERD (gastroesophageal reflux disease)   . Asthma     . Iron deficiency anemia, unspecified 02/18/2013  . Constipation, chronic     IBS, CIC  . Obesity     296 lbs (highest weight in 2012)   . Chronic back pain   . Tendonitis, Achilles, left      Past Surgical History  Procedure Laterality Date  . Cholecystectomy    . Ovarian cyst removal      x4  . Back surgery    . Achilles tendon repair      x2  . Ablation      and bladder sling  . Iron infusion    . Anal fissure repair  2012  . Toe fusion Left      History   Social History Narrative   Marital Status:  Married Information systems manager)    Children:  None    Pets: Dog (01)    Living Situation: Lives with husband     Occupation:  IT - [employer]    Education:  Masters Degree    Tobacco Use/Exposure:  None    Alcohol Use:  Occasional   Drug Use:  None   Diet:  Regular   Exercise:  Kick-boxing and boot camp - 5 times per week    Hobbies: Golf, tennis and working out              Family History  Problem Relation Age of Onset  . Thyroid disease Mother   . Heart Problems Mother  vavle issue  . Dystonia Mother   . Hypercholesterolemia Mother   . Heart attack Father   . Cancer Father     esophageal  . Lung cancer Paternal Grandfather   . Heart disease Paternal Grandfather   . Heart disease Other     paternal and maternal side  . Heart disease Maternal Grandfather   . Heart disease Paternal Grandmother      Current Outpatient Prescriptions on File Prior to Visit  Medication Sig Dispense Refill  . docusate sodium (COLACE) 100 MG capsule Take 100 mg by mouth. Every other day      . LINZESS 290 MCG CAPS capsule Take 290 mcg by mouth daily.       . metFORMIN (GLUCOPHAGE) 500 MG tablet Take 500 mg by mouth 2 (two) times daily with a meal.      . triamterene-hydrochlorothiazide (MAXZIDE-25) 37.5-25 MG per tablet Take 1 tablet by mouth daily.       No current facility-administered medications on file prior to visit.     Allergies  Allergen Reactions  .  Mobic [Meloxicam] Nausea And Vomiting       Objective:   Physical Exam  Vitals reviewed. Constitutional: She is oriented to person, place, and time.  Eyes: Conjunctivae are normal. No scleral icterus.  Neck: Neck supple. No thyromegaly present.  Cardiovascular: Normal rate, regular rhythm and normal heart sounds.   Pulmonary/Chest: Effort normal and breath sounds normal.  Musculoskeletal: She exhibits no edema and no tenderness.  Lymphadenopathy:    She has no cervical adenopathy.  Neurological: She is alert and oriented to person, place, and time.  Skin: Skin is warm and dry.  Psychiatric: She has a normal mood and affect. Her behavior is normal. Judgment and thought content normal.      Assessment & Plan:    Cynthia [name] was seen today for establish care.  Diagnoses and associated orders for this visit:  IFG (impaired fasting glucose) Comments: Her A1c is very controlled at 4.9%.  We discussed that she can actually hold on the metformin since her sugars are so well controlled.   - POCT glycosylated hemoglobin (Hb A1C)  Backache - celecoxib (CELEBREX) 200 MG capsule; Take 1 capsule (200 mg total) by mouth 2 (two) times daily.  Wheezing - albuterol (PROVENTIL HFA;VENTOLIN HFA) 108 (90 BASE) MCG/ACT inhaler; Inhale 2 puffs into the lungs every 6 (six) hours as needed for wheezing.  Swelling of limb - triamterene-hydrochlorothiazide (MAXZIDE-25) 37.5-25 MG per tablet; Take 1/2 - 1 tab daily as needed for swelling  Obesity, unspecified - Phentermine-Topiramate 3.75-23 MG CP24; Take 1 capsule by mouth daily. - Phentermine-Topiramate (QSYMIA) 7.5-46 MG CP24; Take 1 capsule by mouth daily.   TIME SPENT "FACE TO FACE" WITH PATIENT -  32 MINS

## 2013-09-13 DIAGNOSIS — R062 Wheezing: Secondary | ICD-10-CM | POA: Insufficient documentation

## 2013-09-13 DIAGNOSIS — E669 Obesity, unspecified: Secondary | ICD-10-CM | POA: Insufficient documentation

## 2013-09-13 DIAGNOSIS — M7989 Other specified soft tissue disorders: Secondary | ICD-10-CM | POA: Insufficient documentation

## 2013-09-13 DIAGNOSIS — R7301 Impaired fasting glucose: Secondary | ICD-10-CM | POA: Insufficient documentation

## 2013-09-13 DIAGNOSIS — M549 Dorsalgia, unspecified: Secondary | ICD-10-CM | POA: Insufficient documentation

## 2013-10-02 ENCOUNTER — Telehealth: Payer: Self-pay | Admitting: Obstetrics & Gynecology

## 2013-10-02 NOTE — Telephone Encounter (Signed)
Patient calling with concerns regarding vaginal irritation that "cleared up for a little bit" after last annual exam, but has now returned.  She is using douche per Dr. Ammie Ferrier instructions and last used last night, but for the last two weeks she has noticed vaginal irritation that patient describes as "almost a cut" that causes pain with urination. Feels pain when urine hits area. She feels this may be r/t working out. Patient states "I have this all the time" but now symptoms worsening again. No fevers or abdominal pain. Advised would discuss symptoms with Dr. Sabra Heck and return her call with disposition. Patient agreeable.

## 2013-10-02 NOTE — Telephone Encounter (Signed)
Spoke with Dr. Sabra Heck.  Instructions for patient to apply topical olive oil to area and office visit as scheduled for 10/05/13 at 1500. Patient is agreeable to plan.   Routing to provider for final review. Patient agreeable to disposition. Will close encounter

## 2013-10-02 NOTE — Telephone Encounter (Signed)
Patient calling complaining of vaginal irritation and hoping to see Dr. Sabra Heck today. Due to availability, I offered the patient appointments with a nurse practitioner today but she declined. She says Dr. Sabra Heck told her not to see anyone else for this problem when it occurs. Please advise?

## 2013-10-05 ENCOUNTER — Ambulatory Visit (INDEPENDENT_AMBULATORY_CARE_PROVIDER_SITE_OTHER): Payer: BC Managed Care – PPO | Admitting: Obstetrics & Gynecology

## 2013-10-05 ENCOUNTER — Encounter: Payer: Self-pay | Admitting: Obstetrics & Gynecology

## 2013-10-05 VITALS — BP 120/80 | HR 76 | Ht 67.5 in | Wt 215.0 lb

## 2013-10-05 DIAGNOSIS — N760 Acute vaginitis: Secondary | ICD-10-CM

## 2013-10-05 DIAGNOSIS — N952 Postmenopausal atrophic vaginitis: Secondary | ICD-10-CM

## 2013-10-05 DIAGNOSIS — A499 Bacterial infection, unspecified: Secondary | ICD-10-CM

## 2013-10-05 DIAGNOSIS — B9689 Other specified bacterial agents as the cause of diseases classified elsewhere: Secondary | ICD-10-CM

## 2013-10-05 MED ORDER — ESTRADIOL 0.1 MG/GM VA CREA: TOPICAL_CREAM | VAGINAL | Status: DC

## 2013-10-05 MED ORDER — METRONIDAZOLE 500 MG PO TABS: 500.0000 mg | ORAL_TABLET | Freq: Two times a day (BID) | ORAL | Status: DC

## 2013-10-05 NOTE — Progress Notes (Signed)
Subjective:     Patient ID: Cynthia [name], female   DOB: [birth year], 53 y.o.   MRN: [id number]  HPI 43 G0 MWF here with several months hx of increased vaginal dryness and increased pain with intercourse.  Feels a little like "sand paper".  In last week, has also had increased odor with irritation.  Feels like this might be vacterial to her.  She is a little confused by the dryness.  [place] 10/14 92.  No VB, recently.  Review of Systems  All other systems reviewed and are negative.      Objective:   Physical Exam  Constitutional: She is oriented to person, place, and time. She appears well-developed and well-nourished.  Genitourinary: There is no rash or tenderness on the right labia. There is no rash or tenderness on the left labia. Uterus is not tender. Cervix exhibits no discharge. Right adnexum displays no mass and no tenderness. Left adnexum displays no mass and no tenderness. No tenderness or bleeding around the vagina. No foreign body around the vagina. Vaginal discharge found.  Lymphadenopathy:       Right: No inguinal adenopathy present.       Left: No inguinal adenopathy present.  Neurological: She is alert and oriented to person, place, and time.  Skin: Skin is warm and dry.  Psychiatric: She has a normal mood and affect.   Wet smear:  Ph 5.0.  KOH + Whiff., neg yeast.  Saline with neg clue cells, neg trich    Assessment:     Vaginal discharge, possible BV Vaginal dryness, postmenopausal change     Plan:     Flagyl 500mg  bid x 7 days Start estrace vaginal cream 1 gm pv twice weekly.  #42.5gm tube with RFs to pharmacy.  Risks of breast tenderness, breast cancer, DVT/PE, stroke all discussed.   Pt to give me update in 8 weeks.

## 2013-10-15 ENCOUNTER — Ambulatory Visit (INDEPENDENT_AMBULATORY_CARE_PROVIDER_SITE_OTHER): Payer: BC Managed Care – PPO | Admitting: Family Medicine

## 2013-10-15 ENCOUNTER — Other Ambulatory Visit: Payer: Self-pay | Admitting: Family Medicine

## 2013-10-15 ENCOUNTER — Encounter: Payer: Self-pay | Admitting: Family Medicine

## 2013-10-15 VITALS — BP 126/82 | HR 73 | Resp 16 | Ht 67.0 in | Wt 215.0 lb

## 2013-10-15 DIAGNOSIS — R41840 Attention and concentration deficit: Secondary | ICD-10-CM

## 2013-10-15 DIAGNOSIS — D649 Anemia, unspecified: Secondary | ICD-10-CM

## 2013-10-15 DIAGNOSIS — R5383 Other fatigue: Secondary | ICD-10-CM

## 2013-10-15 DIAGNOSIS — N951 Menopausal and female climacteric states: Secondary | ICD-10-CM

## 2013-10-15 DIAGNOSIS — E559 Vitamin D deficiency, unspecified: Secondary | ICD-10-CM

## 2013-10-15 DIAGNOSIS — R5381 Other malaise: Secondary | ICD-10-CM

## 2013-10-15 DIAGNOSIS — Z780 Asymptomatic menopausal state: Secondary | ICD-10-CM

## 2013-10-15 DIAGNOSIS — E669 Obesity, unspecified: Secondary | ICD-10-CM

## 2013-10-15 DIAGNOSIS — R7301 Impaired fasting glucose: Secondary | ICD-10-CM

## 2013-10-15 LAB — IRON AND TIBC
%SAT: 33 % (ref 20–55)
Iron: 94 ug/dL (ref 42–145)
TIBC: 284 ug/dL (ref 250–470)
UIBC: 190 ug/dL (ref 125–400)

## 2013-10-15 LAB — CBC WITH DIFFERENTIAL/PLATELET
Basophils Absolute: 0 10*3/uL (ref 0.0–0.1)
Basophils Relative: 0 % (ref 0–1)
Eosinophils Absolute: 0.2 10*3/uL (ref 0.0–0.7)
Eosinophils Relative: 3 % (ref 0–5)
HCT: 40.1 % (ref 36.0–46.0)
Hemoglobin: 14.5 g/dL (ref 12.0–15.0)
Lymphocytes Relative: 31 % (ref 12–46)
Lymphs Abs: 1.9 10*3/uL (ref 0.7–4.0)
MCH: 30.2 pg (ref 26.0–34.0)
MCHC: 36.2 g/dL — ABNORMAL HIGH (ref 30.0–36.0)
MCV: 83.5 fL (ref 78.0–100.0)
Monocytes Absolute: 0.4 10*3/uL (ref 0.1–1.0)
Monocytes Relative: 6 % (ref 3–12)
Neutro Abs: 3.7 10*3/uL (ref 1.7–7.7)
Neutrophils Relative %: 60 % (ref 43–77)
Platelets: 162 10*3/uL (ref 150–400)
RBC: 4.8 MIL/uL (ref 3.87–5.11)
RDW: 12.8 % (ref 11.5–15.5)
WBC: 6.1 10*3/uL (ref 4.0–10.5)

## 2013-10-15 LAB — POCT GLYCOSYLATED HEMOGLOBIN (HGB A1C): Hemoglobin A1C: 5.1

## 2013-10-15 LAB — LIPID PANEL
Cholesterol: 186 mg/dL (ref 0–200)
HDL: 63 mg/dL (ref >39)
LDL Cholesterol: 101 mg/dL — ABNORMAL HIGH (ref 0–99)
Total CHOL/HDL Ratio: 3 Ratio
Triglycerides: 108 mg/dL (ref <150)
VLDL: 22 mg/dL (ref 0–40)

## 2013-10-15 LAB — TSH: TSH: 2.489 u[IU]/mL (ref 0.350–4.500)

## 2013-10-15 NOTE — Patient Instructions (Addendum)
1)  Weight - You might consider starting on the supplement L-Tyrosine prior to starting the Qsymia to avoid any feelings of "fog".  Start with the Qsymia every other day for a week then increase to daily.  Come in to have your BP checked   2)  Anemia - We are checking a CBC and her iron levels.    3)  Concentration - Complete the ADD form.

## 2013-10-15 NOTE — Progress Notes (Signed)
Subjective:    Patient ID: Cynthia [name], female    DOB: 02/03/1961, 53 y.o.   MRN: [id number]  HPI  Cynthia [name] is here today to discuss a couple of issues:    1)  Weight -  She was given Qysmia at her last visit.  She took the starting dosage for 2 weeks and did fine.  She then started on the next higher dosage and did not feel well so she stopped it after 10 days.  She did get down to 206 when she was on it.  She also says that her BP was very high.  She admits that she was going through a lot when she was taking it so she might not have given it a fair chance.  She has not been taking Metformin and would like to have her A1c rechecked. She would like to also go over her medications.    2)  Anemia -  She was seen by Dr. Marin Olp last fall and received IV iron.  She says that she felt better after receiving the iron.    3)  Concentration - She feels that she has trouble with her concentration.      Review of Systems  Constitutional: Negative for activity change, appetite change, fatigue and unexpected weight change.  Endocrine: Negative for polydipsia, polyphagia and polyuria.  Psychiatric/Behavioral: Positive for decreased concentration. Negative for sleep disturbance.  All other systems reviewed and are negative.   Past Medical History  Diagnosis Date  . Diabetes mellitus   . GERD (gastroesophageal reflux disease)   . Asthma   . Iron deficiency anemia, unspecified 02/18/2013  . Constipation, chronic     IBS, CIC  . Obesity     296 lbs (highest weight in 2012)   . Chronic back pain   . Tendonitis, Achilles, left      Past Surgical History  Procedure Laterality Date  . Cholecystectomy    . Ovarian cyst removal      x4  . Back surgery    . Achilles tendon repair      x2  . Ablation      and bladder sling  . Iron infusion    . Anal fissure repair  2012  . Toe fusion Left      History   Social History Narrative   Marital Status:  Married Information systems manager)    Children:  None    Pets: Dog (01)    Living Situation: Lives with husband     Occupation:  IT - [employer]    Education:  Master's Degree    Tobacco Use/Exposure:  None    Alcohol Use:  Occasional   Drug Use:  None   Diet:  Regular   Exercise:  Kick-boxing and boot camp - 5 times per week    Hobbies: Golf, tennis and working out                 Family History  Problem Relation Age of Onset  . Thyroid disease Mother   . Heart Problems Mother     vavle issue  . Dystonia Mother   . Hypercholesterolemia Mother   . Heart attack Father   . Cancer Father     esophageal  . Lung cancer Paternal Grandfather   . Heart disease Paternal Grandfather   . Heart disease Other     paternal and maternal side  . Heart disease Maternal Grandfather   . Heart disease Paternal Grandmother  Current Outpatient Prescriptions on File Prior to Visit  Medication Sig Dispense Refill  . albuterol (PROVENTIL HFA;VENTOLIN HFA) 108 (90 BASE) MCG/ACT inhaler Inhale 2 puffs into the lungs every 6 (six) hours as needed for wheezing.  1 Inhaler  11  . celecoxib (CELEBREX) 200 MG capsule Take 1 capsule (200 mg total) by mouth 2 (two) times daily.  60 capsule  11  . cyclobenzaprine (FLEXERIL) 10 MG tablet       . dexlansoprazole (DEXILANT) 60 MG capsule Take 60 mg by mouth daily.      [name] Kitchen docusate sodium (COLACE) 100 MG capsule Take 100 mg by mouth. Every other day      . estradiol (ESTRACE) 0.1 MG/GM vaginal cream 1 gm vaginally twice weekly  42.5 g  2  . LINZESS 290 MCG CAPS capsule Take 290 mcg by mouth daily.       [name] Kitchen triamterene-hydrochlorothiazide (MAXZIDE-25) 37.5-25 MG per tablet Take 1 tablet by mouth daily.       No current facility-administered medications on file prior to visit.     Allergies  Allergen Reactions  . Mobic [Meloxicam] Nausea And Vomiting       Objective:   Physical Exam  Vitals reviewed. Constitutional: She is oriented to person, place, and time.  Eyes: Conjunctivae are  normal. No scleral icterus.  Neck: Neck supple. No thyromegaly present.  Cardiovascular: Normal rate, regular rhythm and normal heart sounds.   Pulmonary/Chest: Effort normal and breath sounds normal.  Musculoskeletal: She exhibits no edema and no tenderness.  Lymphadenopathy:    She has no cervical adenopathy.  Neurological: She is alert and oriented to person, place, and time.  Skin: Skin is warm and dry.  Psychiatric: She has a normal mood and affect. Her behavior is normal. Judgment and thought content normal.      Assessment & Plan:    Caleigh was seen today to discuss several issues.    Diagnoses and associated orders for this visit:  Anemia Comments: We're rechecking a CBC and iron level.   - CBC w/Diff - IBC panel  Obesity, unspecified Comments: She is going to get back on the Qsymia.  She may first try taking some L-Tyrosine before she takes the Qsymia.   - Lipid panel  Impaired fasting glucose Comments: Her A1c remains very good off of metformin.   - POCT HgB A1C (5.1%)   Other malaise and fatigue Comments: Checking a TSH.   - TSH  Unspecified vitamin D deficiency - Vit D  25 hydroxy (rtn osteoporosis monitoring)  Menopause - Vit D  25 hydroxy (rtn osteoporosis monitoring)  Concentration deficit Comments: She was given the adult ADD form to complete.  We'll discuss this further at her next visit.

## 2013-10-16 LAB — VITAMIN D 25 HYDROXY (VIT D DEFICIENCY, FRACTURES): Vit D, 25-Hydroxy: 37 ng/mL (ref 30–89)

## 2013-12-01 ENCOUNTER — Ambulatory Visit (INDEPENDENT_AMBULATORY_CARE_PROVIDER_SITE_OTHER): Payer: BC Managed Care – PPO | Admitting: Family Medicine

## 2013-12-01 ENCOUNTER — Encounter: Payer: Self-pay | Admitting: Family Medicine

## 2013-12-01 VITALS — BP 127/86 | HR 81 | Resp 16 | Ht 67.0 in | Wt 205.0 lb

## 2013-12-01 DIAGNOSIS — E669 Obesity, unspecified: Secondary | ICD-10-CM

## 2013-12-01 DIAGNOSIS — J45909 Unspecified asthma, uncomplicated: Secondary | ICD-10-CM

## 2013-12-01 DIAGNOSIS — R05 Cough: Secondary | ICD-10-CM

## 2013-12-01 DIAGNOSIS — R059 Cough, unspecified: Secondary | ICD-10-CM

## 2013-12-01 MED ORDER — PHENTERMINE-TOPIRAMATE ER 7.5-46 MG PO CP24: 1.0000 | ORAL_CAPSULE | Freq: Every day | ORAL | Status: DC

## 2013-12-01 MED ORDER — FLUTICASONE FUROATE-VILANTEROL 100-25 MCG/INH IN AEPB: 1.0000 | INHALATION_SPRAY | Freq: Every day | RESPIRATORY_TRACT | Status: DC

## 2013-12-01 MED ORDER — BENZONATATE 200 MG PO CAPS: 200.0000 mg | ORAL_CAPSULE | Freq: Three times a day (TID) | ORAL | Status: DC | PRN

## 2013-12-01 MED ORDER — HYDROCOD POLST-CHLORPHEN POLST 10-8 MG/5ML PO LQCR: ORAL | Status: DC

## 2013-12-01 NOTE — Patient Instructions (Signed)
1)  Asthma - Start on the Breo 1 puff daily   2)  Cough - Tessalon Perles, Delsym, Tussionex at bedtime, Ricola (Honey Lemon with Echinacea), Umcka Cold Care, Tsp of Honey (+/- Cinnamon).   Asthma, Adult Asthma is a recurring condition in which the airways tighten and narrow. Asthma can make it difficult to breathe. It can cause coughing, wheezing, and shortness of breath. Asthma episodes (also called asthma attacks) range from minor to life-threatening. Asthma cannot be cured, but medicines and lifestyle changes can help control it. CAUSES Asthma is believed to be caused by inherited (genetic) and environmental factors, but its exact cause is unknown. Asthma may be triggered by allergens, lung infections, or irritants in the air. Asthma triggers are different for each person. Common triggers include:   Animal dander.  Dust mites.  Cockroaches.  Pollen from trees or grass.  Mold.  Smoke.  Air pollutants such as dust, household cleaners, hair sprays, aerosol sprays, paint fumes, strong chemicals, or strong odors.  Cold air, weather changes, and winds (which increase molds and pollens in the air).  Strong emotional expressions such as crying or laughing hard.  Stress.  Certain medicines (such as aspirin) or types of drugs (such as beta-blockers).  Sulfites in foods and drinks. Foods and drinks that may contain sulfites include dried fruit, potato chips, and sparkling grape juice.  Infections or inflammatory conditions such as the flu, a cold, or an inflammation of the nasal membranes (rhinitis).  Gastroesophageal reflux disease (GERD).  Exercise or strenuous activity. SYMPTOMS Symptoms may occur immediately after asthma is triggered or many hours later. Symptoms include:  Wheezing.  Excessive nighttime or early morning coughing.  Frequent or severe coughing with a common cold.  Chest tightness.  Shortness of breath. DIAGNOSIS  The diagnosis of asthma is made by a  review of your medical history and a physical exam. Tests may also be performed. These may include:  Lung function studies. These tests show how much air you breath in and out.  Allergy tests.  Imaging tests such as X-rays. TREATMENT  Asthma cannot be cured, but it can usually be controlled. Treatment involves identifying and avoiding your asthma triggers. It also involves medicines. There are 2 classes of medicine used for asthma treatment:   Controller medicines. These prevent asthma symptoms from occurring. They are usually taken every day.  Reliever or rescue medicines. These quickly relieve asthma symptoms. They are used as needed and provide short-term relief. Your health care provider will help you create an asthma action plan. An asthma action plan is a written plan for managing and treating your asthma attacks. It includes a list of your asthma triggers and how they may be avoided. It also includes information on when medicines should be taken and when their dosage should be changed. An action plan may also involve the use of a device called a peak flow meter. A peak flow meter measures how well the lungs are working. It helps you monitor your condition. HOME CARE INSTRUCTIONS   Take medicine as directed by your health care provider. Speak with your health care provider if you have questions about how or when to take the medicines.  Use a peak flow meter as directed by your health care provider. Record and keep track of readings.  Understand and use the action plan to help minimize or stop an asthma attack without needing to seek medical care.  Control your home environment in the following ways to help prevent asthma  attacks:  Do not smoke. Avoid being exposed to secondhand smoke.  Change your heating and air conditioning filter regularly.  Limit your use of fireplaces and wood stoves.  Get rid of pests (such as roaches and mice) and their droppings.  Throw away plants if  you see mold on them.  Clean your floors and dust regularly. Use unscented cleaning products.  Try to have someone else vacuum for you regularly. Stay out of rooms while they are being vacuumed and for a short while afterward. If you vacuum, use a dust mask from a hardware store, a double-layered or microfilter vacuum cleaner bag, or a vacuum cleaner with a HEPA filter.  Replace carpet with wood, tile, or vinyl flooring. Carpet can trap dander and dust.  Use allergy-proof pillows, mattress covers, and box spring covers.  Wash bed sheets and blankets every week in hot water and dry them in a dryer.  Use blankets that are made of polyester or cotton.  Clean bathrooms and kitchens with bleach. If possible, have someone repaint the walls in these rooms with mold-resistant paint. Keep out of the rooms that are being cleaned and painted.  Wash hands frequently. SEEK MEDICAL CARE IF:   You have wheezing, shortness of breath, or a cough even if taking medicine to prevent attacks.  The colored mucus you cough up (sputum) is thicker than usual.  Your sputum changes from clear or white to yellow, green, gray, or bloody.  You have any problems that may be related to the medicines you are taking (such as a rash, itching, swelling, or trouble breathing).  You are using a reliever medicine more than 2 3 times per week.  Your peak flow is still at 50 79% of you personal best after following your action plan for 1 hour. SEEK IMMEDIATE MEDICAL CARE IF:   You seem to be getting worse and are unresponsive to treatment during an asthma attack.  You are short of breath even at rest.  You get short of breath when doing very little physical activity.  You have difficulty eating, drinking, or talking due to asthma symptoms.  You develop chest pain.  You develop a fast heartbeat.  You have a bluish color to your lips or fingernails.  You are lightheaded, dizzy, or faint.  Your peak flow is less  than 50% of your personal best.  You have a fever or persistent symptoms for more than 2 3 days.  You have a fever and symptoms suddenly get worse. MAKE SURE YOU:   Understand these instructions.  Will watch your condition.  Will get help right away if you are not doing well or get worse. Document Released: 06/04/2005 Document Revised: 02/04/2013 Document Reviewed: 01/01/2013 [facility] Patient Information 2014 [place], [place].

## 2013-12-01 NOTE — Progress Notes (Signed)
Subjective:    Patient ID: Cynthia [name], female    DOB: [birth year], 53 y.o.   MRN: [id number]  HPI  Cynthia [name] is here today complaining of what she calls "coughing fits". She been using her inhaler which hasn't been helping her symptoms very much. She would also like to get a refill on her Qsymia. She has lost 10 pounds since her last visit.    Review of Systems  Constitutional: Negative for activity change, appetite change, fatigue and unexpected weight change.  Respiratory: Positive for cough and shortness of breath.   Cardiovascular: Negative for chest pain, palpitations and leg swelling.  Psychiatric/Behavioral: The patient is not nervous/anxious.   All other systems reviewed and are negative.    Past Medical History  Diagnosis Date  . Diabetes mellitus   . GERD (gastroesophageal reflux disease)   . Asthma   . Iron deficiency anemia, unspecified 02/18/2013  . Constipation, chronic     IBS, CIC  . Obesity     296 lbs (highest weight in 2012)   . Chronic back pain   . Tendonitis, Achilles, left      Past Surgical History  Procedure Laterality Date  . Cholecystectomy    . Ovarian cyst removal      x4  . Back surgery    . Achilles tendon repair      x2  . Ablation      and bladder sling  . Iron infusion    . Anal fissure repair  2012  . Toe fusion Left      History   Social History Narrative   Marital Status:  Married Information systems manager)    Children:  None    Pets: Dog (01)    Living Situation: Lives with husband     Occupation:  IT - [employer]    Education:  Master's Degree    Tobacco Use/Exposure:  None    Alcohol Use:  Occasional   Drug Use:  None   Diet:  Regular   Exercise:  Kick-boxing and boot camp - 5 times per week    Hobbies: Golf, tennis and working out                 Family History  Problem Relation Age of Onset  . Thyroid disease Mother   . Heart Problems Mother     vavle issue  . Dystonia Mother   . Hypercholesterolemia Mother    . Heart attack Father   . Cancer Father     esophageal  . Lung cancer Paternal Grandfather   . Heart disease Paternal Grandfather   . Heart disease Other     paternal and maternal side  . Heart disease Maternal Grandfather   . Heart disease Paternal Grandmother      Current Outpatient Prescriptions on File Prior to Visit  Medication Sig Dispense Refill  . albuterol (PROVENTIL HFA;VENTOLIN HFA) 108 (90 BASE) MCG/ACT inhaler Inhale 2 puffs into the lungs every 6 (six) hours as needed for wheezing.  1 Inhaler  11  . celecoxib (CELEBREX) 200 MG capsule Take 1 capsule (200 mg total) by mouth 2 (two) times daily.  60 capsule  11  . cyclobenzaprine (FLEXERIL) 10 MG tablet       . dexlansoprazole (DEXILANT) 60 MG capsule Take 60 mg by mouth daily.      [name] Kitchen docusate sodium (COLACE) 100 MG capsule Take 100 mg by mouth. Every other day      . estradiol (ESTRACE) 0.1  MG/GM vaginal cream 1 gm vaginally twice weekly  42.5 g  2  . LINZESS 290 MCG CAPS capsule Take 290 mcg by mouth daily.       [name] Kitchen triamterene-hydrochlorothiazide (MAXZIDE-25) 37.5-25 MG per tablet Take 1 tablet by mouth daily.       No current facility-administered medications on file prior to visit.     Allergies  Allergen Reactions  . Mobic [Meloxicam] Nausea And Vomiting       Objective:   Physical Exam  Nursing note and vitals reviewed. Constitutional: She is oriented to person, place, and time. She appears well-nourished.  Pulmonary/Chest: Effort normal and breath sounds normal. No respiratory distress. She has no wheezes. She has no rales.  Neurological: She is alert and oriented to person, place, and time.  Skin: Skin is dry.  Psychiatric: She has a normal mood and affect. Her behavior is normal. Judgment and thought content normal.      Assessment & Plan:    Cynthia [name] was seen today for asthma.  Diagnoses and associated orders for this visit:  Unspecified asthma(493.90) - Fluticasone Furoate-Vilanterol (BREO  ELLIPTA) 100-25 MCG/INH AEPB; Inhale 1 puff into the lungs daily.  Obesity, unspecified - Phentermine-Topiramate (QSYMIA) 7.5-46 MG CP24; Take 1 capsule by mouth daily.  Cough -     benzonatate (TESSALON) 200 MG capsule; Take 1 capsule (200 mg total) by mouth 3 (three) times daily as needed for cough. - chlorpheniramine-HYDROcodone (TUSSIONEX PENNKINETIC ER) 10-8 MG/5ML LQCR; Take 1/2 -1 teaspoon at night

## 2013-12-02 ENCOUNTER — Telehealth: Payer: Self-pay

## 2013-12-02 NOTE — Telephone Encounter (Signed)
Louana decided to stay with Advair, she found a coupon for copay.

## 2014-01-22 ENCOUNTER — Ambulatory Visit (INDEPENDENT_AMBULATORY_CARE_PROVIDER_SITE_OTHER): Payer: BC Managed Care – PPO | Admitting: Family Medicine

## 2014-01-22 ENCOUNTER — Encounter: Payer: Self-pay | Admitting: Family Medicine

## 2014-01-22 VITALS — BP 121/78 | HR 67 | Resp 16 | Ht 67.25 in | Wt 199.0 lb

## 2014-01-22 DIAGNOSIS — E669 Obesity, unspecified: Secondary | ICD-10-CM

## 2014-01-22 MED ORDER — PHENTERMINE-TOPIRAMATE ER 11.25-69 MG PO CP24: 1.0000 | ORAL_CAPSULE | Freq: Every day | ORAL | Status: DC

## 2014-01-22 NOTE — Progress Notes (Signed)
Subjective:    Patient ID: Cynthia [name], female    DOB: 28-Sep-1960, 53 y.o.   MRN: [id number]  HPI  Cynthia [name] is here today to follow up on her weight. She is doing well on the Qsymia. She would like to remain on it. She is needing refils.    Review of Systems  Constitutional: Negative for activity change, appetite change, fatigue and unexpected weight change.  Cardiovascular: Negative for chest pain, palpitations and leg swelling.  All other systems reviewed and are negative.    Past Medical History  Diagnosis Date  . Diabetes mellitus   . GERD (gastroesophageal reflux disease)   . Asthma   . Iron deficiency anemia, unspecified 02/18/2013  . Constipation, chronic     IBS, CIC  . Obesity     296 lbs (highest weight in 2012)   . Chronic back pain   . Tendonitis, Achilles, left      Past Surgical History  Procedure Laterality Date  . Cholecystectomy    . Ovarian cyst removal      x4  . Back surgery    . Achilles tendon repair      x2  . Ablation      and bladder sling  . Iron infusion    . Anal fissure repair  2012  . Toe fusion Left      History   Social History Narrative   Marital Status:  Married Information systems manager)    Children:  None    Pets: Dog (01)    Living Situation: Lives with husband     Occupation:  IT - [employer]    Education:  Master's Degree    Tobacco Use/Exposure:  None    Alcohol Use:  Occasional   Drug Use:  None   Diet:  Regular   Exercise:  Kick-boxing and boot camp - 5 times per week    Hobbies: Golf, tennis and working out                 Family History  Problem Relation Age of Onset  . Thyroid disease Mother   . Heart Problems Mother     vavle issue  . Dystonia Mother   . Hypercholesterolemia Mother   . Heart attack Father   . Cancer Father     esophageal  . Lung cancer Paternal Grandfather   . Heart disease Paternal Grandfather   . Heart disease Other     paternal and maternal side  . Heart disease Maternal  Grandfather   . Heart disease Paternal Grandmother      Current Outpatient Prescriptions on File Prior to Visit  Medication Sig Dispense Refill  . albuterol (PROVENTIL HFA;VENTOLIN HFA) 108 (90 BASE) MCG/ACT inhaler Inhale 2 puffs into the lungs every 6 (six) hours as needed for wheezing.  1 Inhaler  11  . celecoxib (CELEBREX) 200 MG capsule Take 1 capsule (200 mg total) by mouth 2 (two) times daily.  60 capsule  11  . cyclobenzaprine (FLEXERIL) 10 MG tablet       . dexlansoprazole (DEXILANT) 60 MG capsule Take 60 mg by mouth daily.      [name] Kitchen docusate sodium (COLACE) 100 MG capsule Take 100 mg by mouth. Every other day      . estradiol (ESTRACE) 0.1 MG/GM vaginal cream 1 gm vaginally twice weekly  42.5 g  2  . LINZESS 290 MCG CAPS capsule Take 290 mcg by mouth daily.       . Phentermine-Topiramate (  QSYMIA) 7.5-46 MG CP24 Take 1 capsule by mouth daily.  30 capsule  2  . triamterene-hydrochlorothiazide (MAXZIDE-25) 37.5-25 MG per tablet Take 1 tablet by mouth daily.       No current facility-administered medications on file prior to visit.     Allergies  Allergen Reactions  . Mobic [Meloxicam] Nausea And Vomiting       Objective:   Physical Exam  Nursing note and vitals reviewed. Constitutional: She is oriented to person, place, and time. She appears well-nourished.  Pulmonary/Chest: Effort normal and breath sounds normal. No respiratory distress. She has no wheezes. She has no rales.  Neurological: She is alert and oriented to person, place, and time.  Skin: Skin is dry.  Psychiatric: She has a normal mood and affect. Her behavior is normal. Judgment and thought content normal.      Assessment & Plan:    Cynthia [name] was seen today for medication management.  Diagnoses and associated orders for this visit:  Obesity, unspecified - Phentermine-Topiramate (QSYMIA) 11.25-69 MG CP24; Take 1 capsule by mouth daily.

## 2014-02-01 ENCOUNTER — Encounter: Payer: Self-pay | Admitting: Family Medicine

## 2014-02-24 ENCOUNTER — Encounter: Payer: Self-pay | Admitting: Emergency Medicine

## 2014-02-24 ENCOUNTER — Emergency Department
Admission: EM | Admit: 2014-02-24 | Discharge: 2014-02-24 | Disposition: A | Discharge status: Home / Self Care | Payer: BC Managed Care – PPO | Source: Home / Self Care | Attending: Emergency Medicine | Admitting: Emergency Medicine

## 2014-02-24 DIAGNOSIS — H9319 Tinnitus, unspecified ear: Secondary | ICD-10-CM

## 2014-02-24 DIAGNOSIS — H9312 Tinnitus, left ear: Secondary | ICD-10-CM

## 2014-02-24 NOTE — ED Notes (Addendum)
Cynthia [name] c/o left ear feeling clogged, ringing with decreased hearing x 6 days. No cerumen visualized on assessment. She recently returned from [place] where she did swimming and snorkeling.  No pain or fever.

## 2014-02-24 NOTE — Discharge Instructions (Signed)
Tinnitus °Sounds you hear in your ears and coming from within the ear is called tinnitus. This can be a symptom of many ear disorders. It is often associated with hearing loss.  °Tinnitus can be seen with: °· Infections. °· Ear blockages such as wax buildup. °· Meniere's disease. °· Ear damage. °· Inherited. °· Occupational causes. °While irritating, it is not usually a threat to health. When the cause of the tinnitus is wax, infection in the middle ear, or foreign body it is easily treated. Hearing loss will usually be reversible.  °TREATMENT  °When treating the underlying cause does not get rid of tinnitus, it may be necessary to get rid of the unwanted sound by covering it up with more pleasant background noises. This may include music, the radio etc. There are tinnitus maskers which can be worn which produce background noise to cover up the tinnitus. °Avoid all medications which tend to make tinnitus worse such as alcohol, caffeine, aspirin, and nicotine. There are many soothing background tapes such as rain, ocean, thunderstorms, etc. These soothing sounds help with sleeping or resting. °Keep all follow-up appointments and referrals. This is important to identify the cause of the problem. It also helps avoid complications, impaired hearing, disability, or chronic pain. °Document Released: 06/04/2005 Document Revised: 08/27/2011 Document Reviewed: 01/21/2008 °ExitCare® Patient Information ©2015 [employer]. This information is not intended to replace advice given to you by your health care provider. Make sure you discuss any questions you have with your health care provider. ° °

## 2014-02-24 NOTE — ED Provider Notes (Signed)
CSN: [id number]     Arrival date & time 02/24/14  1719 History   First MD Initiated Contact with Patient 02/24/14 1754     Chief Complaint  Patient presents with  . Tinnitus   (Consider location/radiation/quality/duration/timing/severity/associated sxs/prior Treatment) Patient is a 53 y.o. female presenting with plugged ear sensation. The history is provided by the patient. No language interpreter was used.  Ear Fullness This is a new problem. The current episode started more than 1 week ago. The problem occurs constantly. The problem has not changed since onset.Nothing aggravates the symptoms. Nothing relieves the symptoms. She has tried nothing for the symptoms. The treatment provided no relief.  Pt has ringing and ear fullness since scuba diving.  She was not deep.  No bleeding  Past Medical History  Diagnosis Date  . Diabetes mellitus   . GERD (gastroesophageal reflux disease)   . Asthma   . Iron deficiency anemia, unspecified 02/18/2013  . Constipation, chronic     IBS, CIC  . Obesity     296 lbs (highest weight in 2012)   . Chronic back pain   . Tendonitis, Achilles, left    Past Surgical History  Procedure Laterality Date  . Cholecystectomy    . Ovarian cyst removal      x4  . Back surgery    . Achilles tendon repair      x2  . Ablation      and bladder sling  . Iron infusion    . Anal fissure repair  2012  . Toe fusion Left    Family History  Problem Relation Age of Onset  . Thyroid disease Mother   . Heart Problems Mother     vavle issue  . Dystonia Mother   . Hypercholesterolemia Mother   . Heart attack Father   . Cancer Father     esophageal  . Lung cancer Paternal Grandfather   . Heart disease Paternal Grandfather   . Heart disease Other     paternal and maternal side  . Heart disease Maternal Grandfather   . Heart disease Paternal Grandmother    History  Substance Use Topics  . Smoking status: Never Smoker   . Smokeless tobacco: Never Used  .  Alcohol Use: 0.5 - 1.0 oz/week    1-2 drink(s) per week   OB History   Grav Para Term Preterm Abortions TAB SAB Ect Mult Living   0 0 0 0 0 0 0 0 0 0      Review of Systems  All other systems reviewed and are negative.   Allergies  Mobic  Home Medications   Prior to Admission medications   Medication Sig Start Date End Date Taking? Authorizing Provider  albuterol (PROVENTIL HFA;VENTOLIN HFA) 108 (90 BASE) MCG/ACT inhaler Inhale 2 puffs into the lungs every 6 (six) hours as needed for wheezing. 07/16/13 07/16/14  Jonathon Resides, MD  celecoxib (CELEBREX) 200 MG capsule Take 1 capsule (200 mg total) by mouth 2 (two) times daily. 07/16/13 07/16/14  Jonathon Resides, MD  cyclobenzaprine (FLEXERIL) 10 MG tablet  05/25/13   Historical Provider, MD  dexlansoprazole (DEXILANT) 60 MG capsule Take 60 mg by mouth daily.    Historical Provider, MD  docusate sodium (COLACE) 100 MG capsule Take 100 mg by mouth. Every other day    Historical Provider, MD  estradiol (ESTRACE) 0.1 MG/GM vaginal cream 1 gm vaginally twice weekly 10/05/13   Lyman Speller, MD  Fluticasone-Salmeterol (ADVAIR) 100-50 MCG/DOSE AEPB Inhale  1 puff into the lungs 2 (two) times daily.    Historical Provider, MD  LINZESS 290 MCG CAPS capsule Take 290 mcg by mouth daily.  02/06/13   Historical Provider, MD  Phentermine-Topiramate (QSYMIA) 11.25-69 MG CP24 Take 1 capsule by mouth daily. 01/22/14 01/23/15  Jonathon Resides, MD  Phentermine-Topiramate (QSYMIA) 7.5-46 MG CP24 Take 1 capsule by mouth daily. 12/01/13 12/02/14  Jonathon Resides, MD  triamterene-hydrochlorothiazide (MAXZIDE-25) 37.5-25 MG per tablet Take 1 tablet by mouth daily.    Historical Provider, MD   BP 128/83  Pulse 67  Temp(Src) 97.8 F (36.6 C) (Oral)  Resp 14  Wt 199 lb (90.266 kg)  SpO2 100% Physical Exam  Nursing note and vitals reviewed. Constitutional: She is oriented to person, place, and time. She appears well-developed and well-nourished.  HENT:  Head:  Normocephalic.  Right Ear: External ear normal.  Left Ear: External ear normal.  bilat tm's clear  Eyes: EOM are normal.  Neck: Normal range of motion.  Pulmonary/Chest: Effort normal.  Abdominal: She exhibits no distension.  Musculoskeletal: Normal range of motion.  Neurological: She is alert and oriented to person, place, and time.  Skin: Skin is warm.  Psychiatric: She has a normal mood and affect.    ED Course  Procedures (including critical care time) Labs Review Labs Reviewed - No data to display  Imaging Review No results found.   MDM   1. Tinnitus, left    Pt has appointment with ENT on Friday.   I do not see any sign of infection.       Fransico Meadow, PA-C 02/24/14 1758

## 2014-02-26 NOTE — ED Provider Notes (Signed)
Medical history/examination/treatment/procedure(s) were performed by non-physician provider and as supervising physician I was immediately available for consultation/collaboration.   Jacqulyn Cane, MD 02/26/14 [phone]

## 2014-05-11 DIAGNOSIS — J4530 Mild persistent asthma, uncomplicated: Secondary | ICD-10-CM | POA: Insufficient documentation

## 2014-05-11 DIAGNOSIS — M545 Low back pain, unspecified: Secondary | ICD-10-CM | POA: Insufficient documentation

## 2014-05-11 DIAGNOSIS — K219 Gastro-esophageal reflux disease without esophagitis: Secondary | ICD-10-CM | POA: Insufficient documentation

## 2014-05-11 DIAGNOSIS — K5909 Other constipation: Secondary | ICD-10-CM | POA: Insufficient documentation

## 2014-05-11 DIAGNOSIS — I10 Essential (primary) hypertension: Secondary | ICD-10-CM | POA: Insufficient documentation

## 2014-05-11 DIAGNOSIS — F988 Other specified behavioral and emotional disorders with onset usually occurring in childhood and adolescence: Secondary | ICD-10-CM | POA: Insufficient documentation

## 2014-05-12 IMAGING — US US SOFT TISSUE HEAD/NECK
1 series · 14 of 25 positions shown · non-contrast
Comparison: None.

CLINICAL DATA: 54-year-old female with a history of thyromegaly.

EXAM:
THYROID ULTRASOUND
TECHNIQUE: Ultrasound examination of the thyroid gland and adjacent soft
tissues was performed.

[Series 1: us soft tissue head/neck · 0.07mm/px · 14 of 72 slices shown]
[im 1/72]
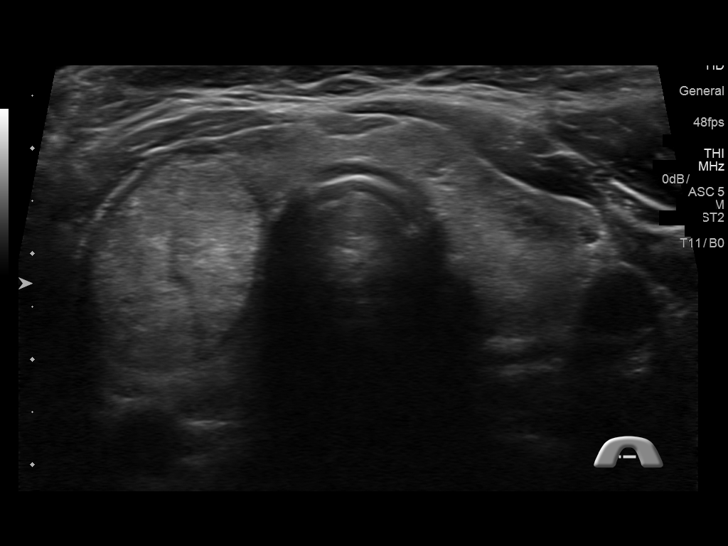
[im 6/72]
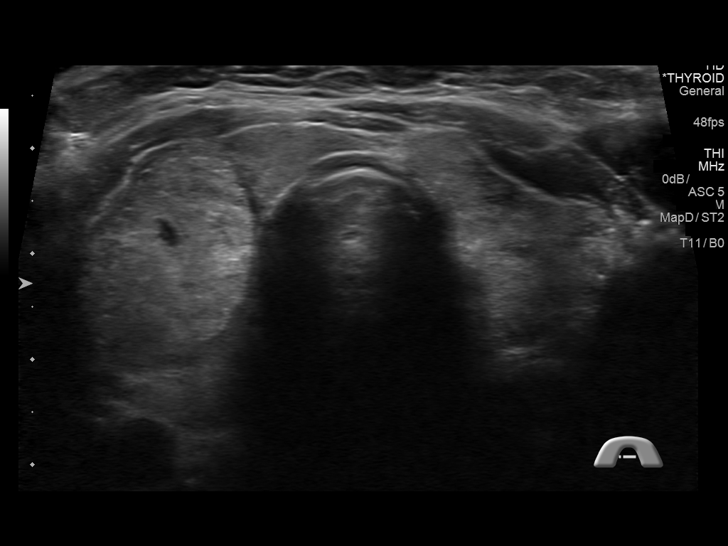
[im 12/72]
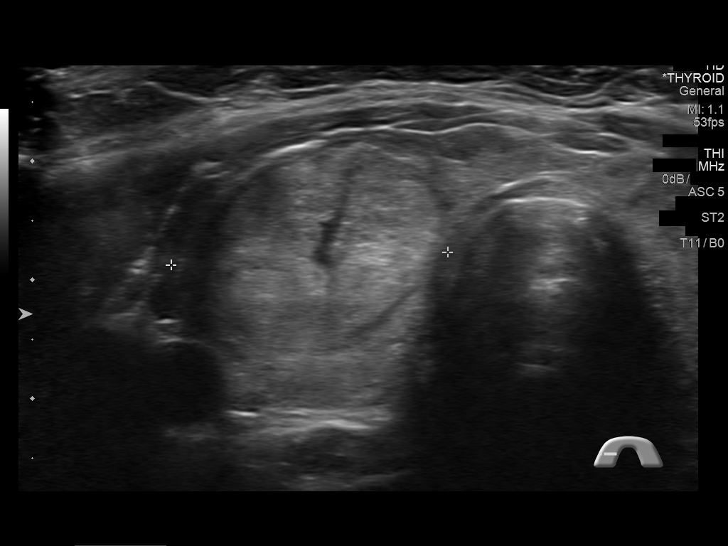
[im 18/72]
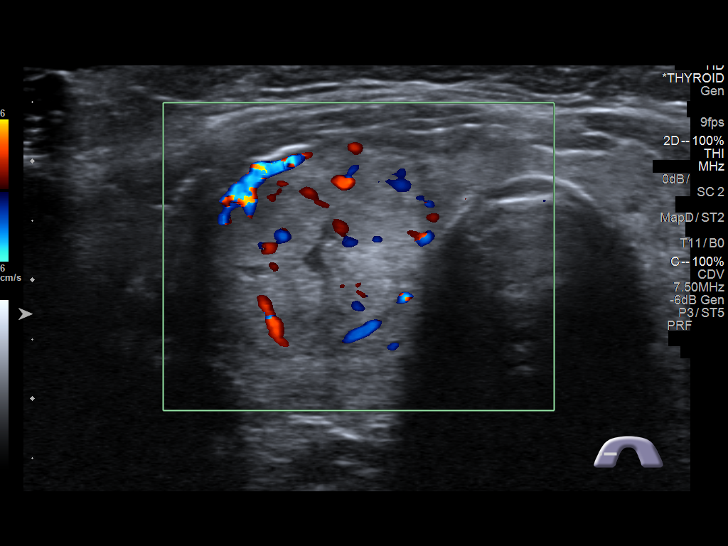
[im 24/72]
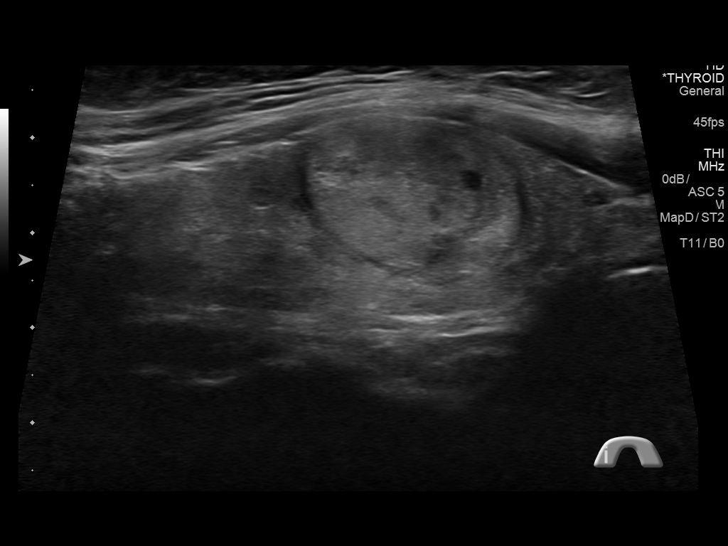
[im 27/72]
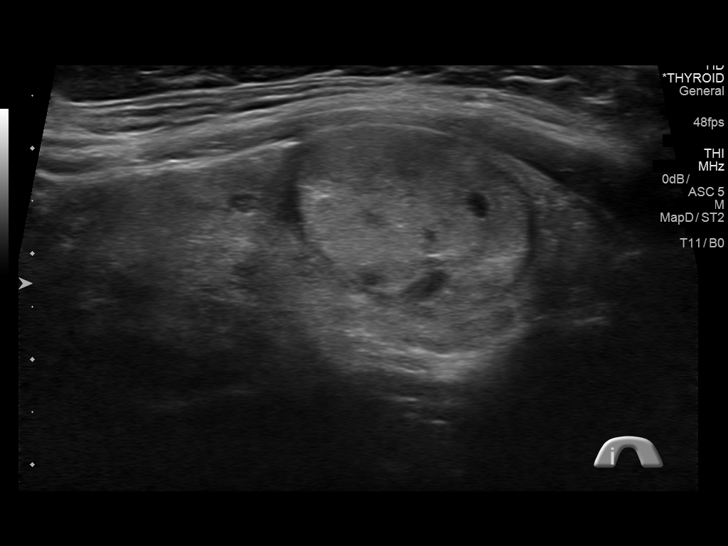
[im 33/72]
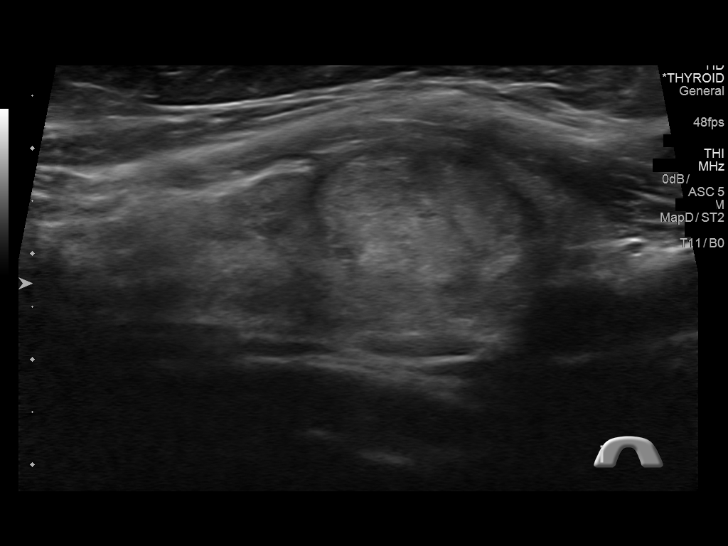
[im 39/72]
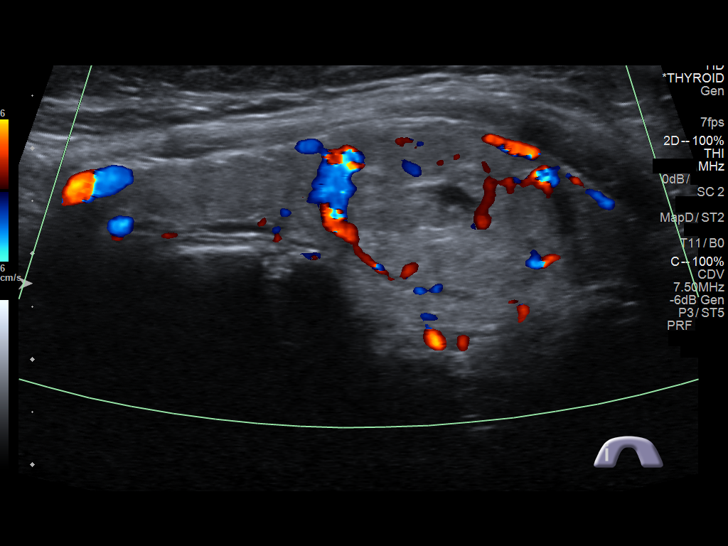
[im 45/72]
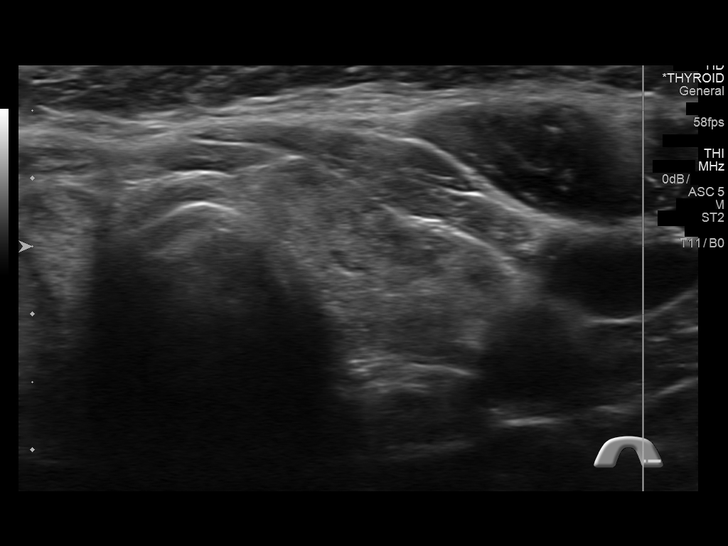
[im 48/72]
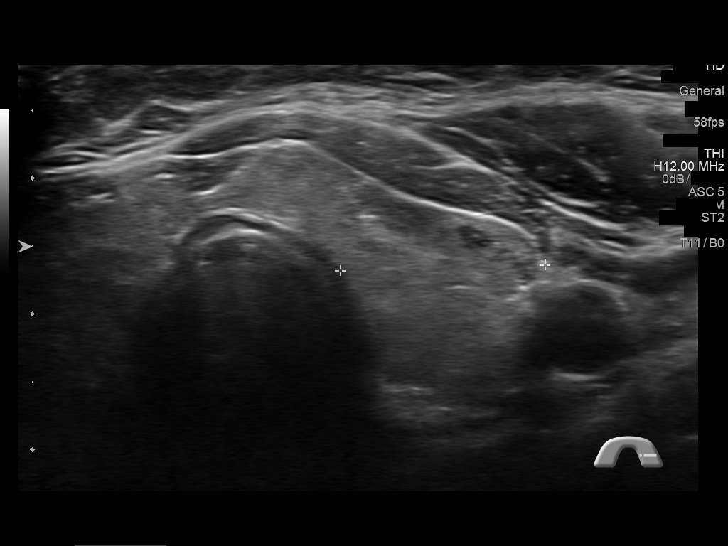
[im 54/72]
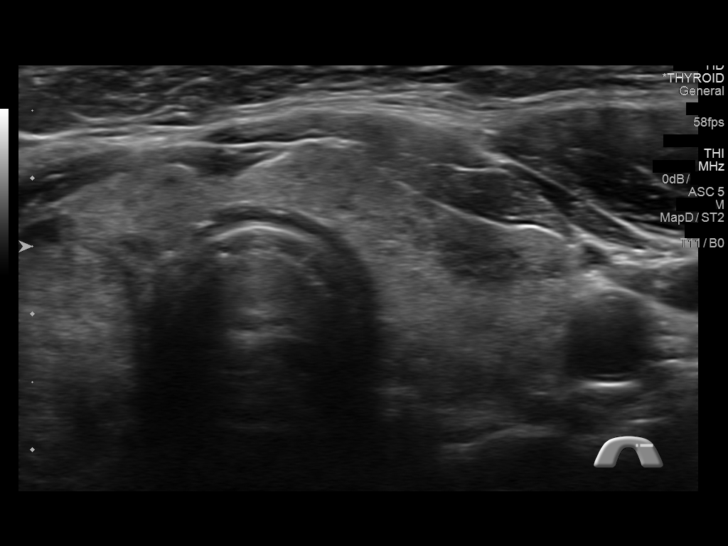
[im 60/72]
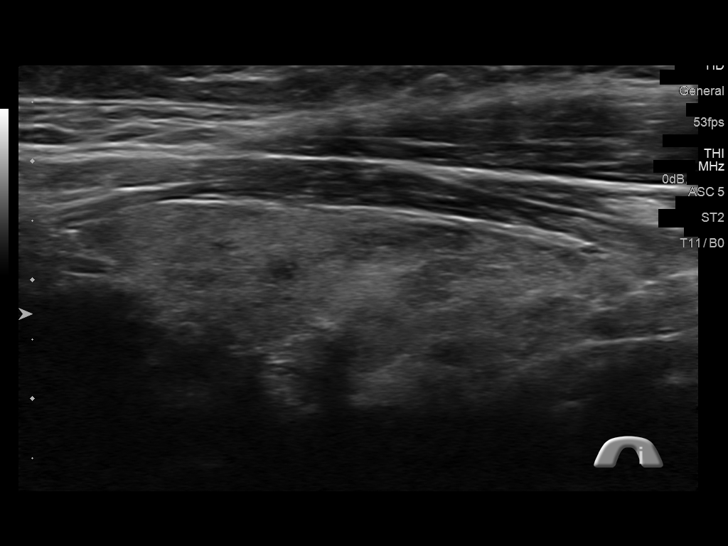
[im 66/72]
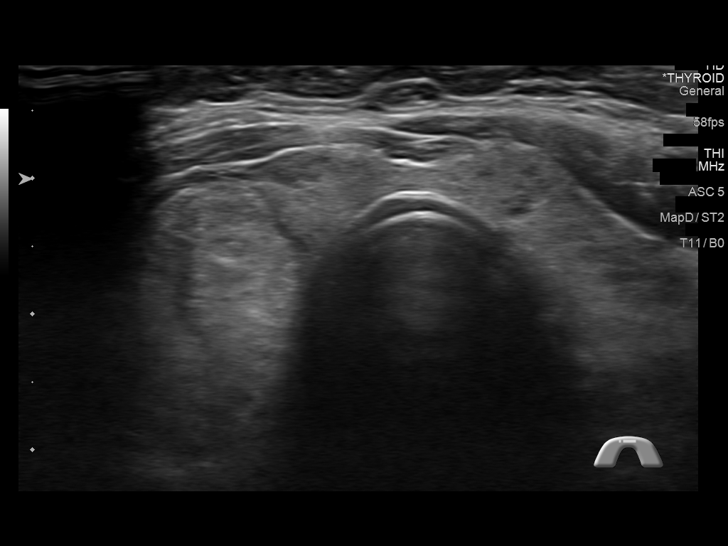
[im 72/72]
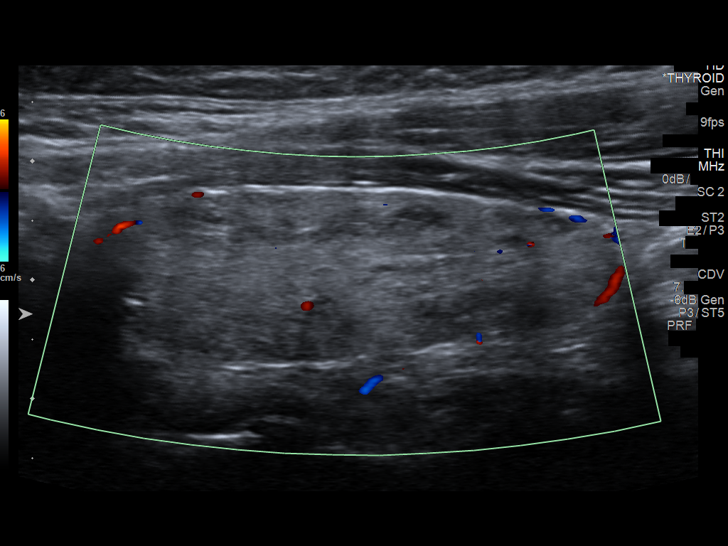

[14 of 25 positions shown; findings below may reference images not displayed]

FINDINGS: Right thyroid lobe

Measurements: 6.4 cm x 2.5 cm x 2.3 cm. Heterogeneous appearance of
the right thyroid tissue. Single nodule on the right with no
internal calcifications. Hypoechoic margin. This nodule measures
cm x 1.8 cm x 1.9 cm and is responsible for increasing the AP
diameter and transverse diameter of the thyroid at this level. No
significant internal flow.

Left thyroid lobe

Measurements: 5.6 cm x 1.7 cm x 1.5 cm. Heterogeneous left thyroid.
No focal nodules.

Isthmus

Thickness: 4 mm.  No nodules visualized.

Lymphadenopathy

None visualized.
IMPRESSION: Heterogeneous thyroid with single dominant right-sided thyroid
nodule.

Findings meet consensus criteria for biopsy. Ultrasound-guided fine
needle aspiration should be considered, as per the consensus
statement: Management of Thyroid Nodules Detected at US: Society of
Radiologists in Ultrasound Consensus Conference Statement. Radiology
[I3]; [DATE].

## 2014-05-24 ENCOUNTER — Ambulatory Visit: Payer: BC Managed Care – PPO | Admitting: Obstetrics & Gynecology

## 2014-05-24 ENCOUNTER — Telehealth: Payer: Self-pay | Admitting: *Deleted

## 2014-05-24 NOTE — Telephone Encounter (Signed)
Call to patient to be schedule annual exam appointment today due to provider schedule. Left message to call back.

## 2014-05-25 NOTE — Telephone Encounter (Signed)
Patient returned call yesterday and appointment rescheduled.  Routing to provider for final review. Patient agreeable to disposition. Will close encounter

## 2014-06-15 ENCOUNTER — Encounter: Payer: Self-pay | Admitting: Obstetrics & Gynecology

## 2014-06-15 ENCOUNTER — Ambulatory Visit (INDEPENDENT_AMBULATORY_CARE_PROVIDER_SITE_OTHER): Payer: BC Managed Care – PPO | Admitting: Obstetrics & Gynecology

## 2014-06-15 VITALS — BP 104/68 | HR 60 | Resp 16 | Ht 67.25 in | Wt 200.2 lb

## 2014-06-15 DIAGNOSIS — Z0000 Encounter for general adult medical examination without abnormal findings: Secondary | ICD-10-CM

## 2014-06-15 DIAGNOSIS — D509 Iron deficiency anemia, unspecified: Secondary | ICD-10-CM

## 2014-06-15 DIAGNOSIS — Z01419 Encounter for gynecological examination (general) (routine) without abnormal findings: Secondary | ICD-10-CM

## 2014-06-15 LAB — POCT URINALYSIS DIPSTICK
Bilirubin, UA: NEGATIVE
Blood, UA: NEGATIVE
Glucose, UA: NEGATIVE
Ketones, UA: NEGATIVE
Leukocytes, UA: NEGATIVE
Nitrite, UA: NEGATIVE
Protein, UA: NEGATIVE
Urobilinogen, UA: NEGATIVE
pH, UA: 5

## 2014-06-15 MED ORDER — ESTRADIOL 0.1 MG/GM VA CREA: TOPICAL_CREAM | VAGINAL | Status: DC

## 2014-06-15 NOTE — Progress Notes (Signed)
53 y.o. G0P0000 MarriedCaucasianF here for annual exam.  Reports having back issues last year.  Had MRI last year with neurosurgeon at [facility].  Also had an ultrasound on her foot.  Did have bulging discs but surgery was not recommended.    Went to [place] this summer.  Really fun trip.    No vaginal bleeding.  Reports she is feeling some increased fatigue.  Not sure if this is related to her anemia or to caring for her husband who had a partial colectomy due to recurrent diverticulitis.  PCP:  Dr. Dion Saucier  No LMP recorded. Patient has had an ablation.          Sexually active: Yes.    The current method of family planning is vasectomy.    Exercising: Yes.    kickboxing and bootcamp Smoker:  no  Health Maintenance: Pap:  04/16/13 WNL History of abnormal Pap:  no MMG:  05/19/12-normal.  She is aware it is due. Colonoscopy:  2012-Dr Mann-repeat in 5 years BMD:   none TDaP:  2012/2013 Screening Labs: iron and CBC studies today, Hb today: today, Urine today: negative   reports that she has never smoked. She has never used smokeless tobacco. She reports that she drinks about 0.5 - 1.0 oz of alcohol per week. She reports that she does not use illicit drugs.  Past Medical History  Diagnosis Date  . Diabetes mellitus   . GERD (gastroesophageal reflux disease)   . Asthma   . Iron deficiency anemia, unspecified 02/18/2013  . Constipation, chronic     IBS, CIC  . Obesity     296 lbs (highest weight in 2012)   . Chronic back pain   . Tendonitis, Achilles, left   . ADD (attention deficit disorder)     and OCD-on Vyvanse    Past Surgical History  Procedure Laterality Date  . Cholecystectomy    . Ovarian cyst removal      x4  . Back surgery    . Achilles tendon repair      x2  . Ablation      and bladder sling  . Iron infusion    . Anal fissure repair  2012  . Toe fusion Left   . Mouth surgery  2015    3 implants    Current Outpatient Prescriptions  Medication Sig Dispense Refill   . albuterol (PROVENTIL HFA;VENTOLIN HFA) 108 (90 BASE) MCG/ACT inhaler Inhale 2 puffs into the lungs every 6 (six) hours as needed for wheezing. 1 Inhaler 11  . celecoxib (CELEBREX) 200 MG capsule Take 1 capsule (200 mg total) by mouth 2 (two) times daily. 60 capsule 11  . cyclobenzaprine (FLEXERIL) 10 MG tablet as needed.     [name] Kitchen dexlansoprazole (DEXILANT) 60 MG capsule Take 60 mg by mouth daily.    [name] Kitchen docusate sodium (COLACE) 100 MG capsule Take 100 mg by mouth. Every other day    . estradiol (ESTRACE) 0.1 MG/GM vaginal cream 1 gm vaginally twice weekly 42.5 g 2  . Fluticasone-Salmeterol (ADVAIR) 100-50 MCG/DOSE AEPB Inhale 1 puff into the lungs 2 (two) times daily.    [name] Kitchen LINZESS 290 MCG CAPS capsule Take 290 mcg by mouth daily.     [name] Kitchen triamterene-hydrochlorothiazide (MAXZIDE-25) 37.5-25 MG per tablet Take 1 tablet by mouth daily.    [name] Kitchen VYVANSE 50 MG capsule      No current facility-administered medications for this visit.    Family History  Problem Relation Age of Onset  . Thyroid  disease Mother   . Heart Problems Mother     vavle issue  . Dystonia Mother   . Hypercholesterolemia Mother   . Heart attack Father   . Cancer Father     esophageal  . Lung cancer Paternal Grandfather   . Heart disease Paternal Grandfather   . Heart disease Other     paternal and maternal side  . Heart disease Maternal Grandfather   . Heart disease Paternal Grandmother     ROS:  Pertinent items are noted in HPI.  Otherwise, a comprehensive ROS was negative.  Exam:   BP 104/68 mmHg  Pulse 60  Resp 16  Ht 5' 7.25" (1.708 m)  Wt 200 lb 3.2 oz (90.81 kg)  BMI 31.13 kg/m2  LMP   Weight change: -15#  Height: 5' 7.25" (170.8 cm)  Ht Readings from Last 3 Encounters:  06/15/14 5' 7.25" (1.708 m)  01/22/14 5' 7.25" (1.708 m)  12/01/13 5\' 7"  (1.702 m)    General appearance: alert, cooperative and appears stated age Head: Normocephalic, without obvious abnormality, atraumatic Neck: no adenopathy,  supple, symmetrical, trachea midline and thyroid normal to inspection and palpation Lungs: clear to auscultation bilaterally Breasts: normal appearance, no masses or tenderness Heart: regular rate and rhythm Abdomen: soft, non-tender; bowel sounds normal; no masses,  no organomegaly Extremities: extremities normal, atraumatic, no cyanosis or edema Skin: Skin color, texture, turgor normal. No rashes or lesions Lymph nodes: Cervical, supraclavicular, and axillary nodes normal. No abnormal inguinal nodes palpated Neurologic: Grossly normal   Pelvic: External genitalia:  no lesions              Urethra:  normal appearing urethra with no masses, tenderness or lesions              Bartholins and Skenes: normal                 Vagina: normal appearing vagina with normal color and discharge, no lesions              Cervix: no lesions              Pap taken: Yes.   Bimanual Exam:  Uterus:  normal size, contour, position, consistency, mobility, non-tender              Adnexa: normal adnexa and no mass, fullness, tenderness               Rectovaginal: Confirms               Anus:  normal sphincter tone, no lesions  Chaperone was present for exam.  A:  Well Woman with normal exam H/O endometrial ablation H/O recurrent BV but this has not been an issue for her this last year Atrophic vaginal changes Intentional weight loss.  Has lost another #15 pounds.  P: Mammogram yearly.  Pt knows this is overdue!!  States she will schedule this on her own pap smear with neg HR HPV 2012. Pap 2014.  No pap today Iron and ferritin, CBC today Estrace vaginal cream 1 gm pv twice weekly.  Rx to pharmacy with RFs. return annually or prn

## 2014-06-21 NOTE — Progress Notes (Signed)
CBC and iron studies cancelled as pt left before having labs drawn.

## 2014-07-26 ENCOUNTER — Other Ambulatory Visit: Payer: Self-pay | Admitting: Family

## 2014-08-23 ENCOUNTER — Telehealth: Payer: Self-pay | Admitting: *Deleted

## 2014-08-23 NOTE — Telephone Encounter (Signed)
Patient thinks she may have BV. Would like appt with Dr. Sabra Heck today.  Please call back to assess

## 2014-08-23 NOTE — Telephone Encounter (Signed)
Spoke with patient. Patient states while on vacation she was seen at a local urgent care for vaginal itching, redness, and burning. "They gave me cream but said that I did not have any sort of infection. When I got home I went to my PCP to get everything checked and I had a vaginal bump. 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 I am just really confused. I got the results through an email and I really want to see Dr.Miller." Patient is currently having vaginal redness and itching. Patient requests to only see Dr.Miller. Advised will need to speak with provider and return call with appointment date and time options. Patient is agreeable.

## 2014-08-23 NOTE — Telephone Encounter (Signed)
Spoke with patient. Appointment scheduled for 3/10 at 1:30pm with Dr.Miller (day and time per Gay Filler). Patient is agreeable to date and time and will bring recent lab results with her to appointment.  Routing to provider for final review. Patient agreeable to disposition. Will close encounter

## 2014-08-26 ENCOUNTER — Encounter: Payer: Self-pay | Admitting: Obstetrics & Gynecology

## 2014-08-26 ENCOUNTER — Ambulatory Visit (INDEPENDENT_AMBULATORY_CARE_PROVIDER_SITE_OTHER): Payer: BLUE CROSS/BLUE SHIELD | Admitting: Obstetrics & Gynecology

## 2014-08-26 VITALS — BP 110/66 | HR 60 | Resp 16 | Wt 202.0 lb

## 2014-08-26 DIAGNOSIS — E876 Hypokalemia: Secondary | ICD-10-CM | POA: Diagnosis not present

## 2014-08-26 DIAGNOSIS — B009 Herpesviral infection, unspecified: Secondary | ICD-10-CM | POA: Diagnosis not present

## 2014-08-26 LAB — BASIC METABOLIC PANEL
BUN: 24 mg/dL — ABNORMAL HIGH (ref 6–23)
CO2: 27 mEq/L (ref 19–32)
Calcium: 8.9 mg/dL (ref 8.4–10.5)
Chloride: 100 mEq/L (ref 96–112)
Creat: 0.8 mg/dL (ref 0.50–1.10)
Glucose, Bld: 100 mg/dL — ABNORMAL HIGH (ref 70–99)
Potassium: 3.1 mEq/L — ABNORMAL LOW (ref 3.5–5.3)
Sodium: 139 mEq/L (ref 135–145)

## 2014-08-26 MED ORDER — VALACYCLOVIR HCL 500 MG PO TABS: 500.0000 mg | ORAL_TABLET | Freq: Every day | ORAL | Status: DC

## 2014-08-26 NOTE — Progress Notes (Signed)
Subjective:     Patient ID: Cynthia [name], female   DOB: [birth year], 54 y.o.   MRN: [id number]  HPI 54 yo G0 MWF here for discussion of recent labs done with Dr. Dion Saucier.  Pt reports having episodic lesions that were peri-rectally and occur in the same location.  There is some itching associated with this and some pain.  She has looked at pictures on the Internet and she is sure it is HSV.  Pt was tested by PCP, Dr. Dion Saucier.  She brings test results with her today.  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  This information was relayed to her via [place].  She is very upset by this and so she came for my opinion/interpretation of the results.  D/W pt this means exposure that is several months old but could be several years and there is no way to be sure.  She is in a monogamous marriage but this has made her nervous about this.  She is sure she has not had other partners.  Pt aware HSV testing results could have resulted from exposure in prior partner for both pt and her husband.    D/W pt typical treatment regimens for episodic or suppressive therapy.  I would recommend suppressive therapy treatment and now episodic based on the number of episodes it sounds like she has had.  Pt is in agreement with this.  Pt would also like to have some blood work.  She reports having a low potassium level and recommendation for taking potassium.  As she feels her trust has been broken, she would like my opinion/recommendation.  Pt also reports a lot of stressors that she thinks are contributing to the break outs as well.  D/w pt link with stressors and recurrences.    Review of Systems     Objective:   Physical Exam  Constitutional: She appears well-developed and well-nourished.  Psychiatric: She has a normal mood and affect.       Assessment:     Recurrent HSV outbreaks     Plan:     Valtrex suppresive therapy initiated.  500mg  daily.  Increase to bid x 3 days with symptoms.    With any new symptoms, pt will call  to be seen. Names for PCP given BMP today Husband with have STD testing done tomorrow so pt declines additional HSV testing today.     ~20 minutes spent with patient >50% of time was in face to face discussion of above.

## 2014-09-06 ENCOUNTER — Other Ambulatory Visit: Payer: Self-pay

## 2014-09-06 MED ORDER — POTASSIUM CHLORIDE ER 10 MEQ PO TBCR: 10.0000 meq | EXTENDED_RELEASE_TABLET | ORAL | Status: DC

## 2014-09-06 NOTE — Telephone Encounter (Signed)
Correct Rx signed.

## 2014-09-06 NOTE — Telephone Encounter (Signed)
Patient notified of results. Rx ordered, will you make sure this is correct. Follow up lab appointment made for 09/27/14.//kn

## 2014-09-06 NOTE — Telephone Encounter (Signed)
-----   Message from Megan Salon, MD sent at 09/02/2014  4:15 PM EDT ----- Inform pt her potassium was 3.1.  She does need some potassium supplementation.  KDur 41meq every other day for three weeks, then recheck.  Also, I want to recheck her BUN at the same time to make sure she hydrates will for this lab.  Order placed.

## 2014-09-17 DIAGNOSIS — D492 Neoplasm of unspecified behavior of bone, soft tissue, and skin: Secondary | ICD-10-CM

## 2014-09-17 HISTORY — DX: Neoplasm of unspecified behavior of bone, soft tissue, and skin: D49.2

## 2014-09-27 ENCOUNTER — Telehealth: Payer: Self-pay | Admitting: Obstetrics & Gynecology

## 2014-09-27 ENCOUNTER — Other Ambulatory Visit: Payer: BLUE CROSS/BLUE SHIELD

## 2014-09-27 NOTE — Telephone Encounter (Signed)
Patient dnka her lab appointment today at 11:30am. Left a message for patient to call and reschedule.

## 2014-09-27 NOTE — Telephone Encounter (Signed)
Thank you.  OK to close encounter. 

## 2014-09-30 ENCOUNTER — Other Ambulatory Visit: Payer: BLUE CROSS/BLUE SHIELD

## 2014-09-30 DIAGNOSIS — E876 Hypokalemia: Secondary | ICD-10-CM

## 2014-09-30 LAB — COMPREHENSIVE METABOLIC PANEL
ALT: 13 U/L (ref 0–35)
AST: 16 U/L (ref 0–37)
Albumin: 4.4 g/dL (ref 3.5–5.2)
Alkaline Phosphatase: 56 U/L (ref 39–117)
BUN: 28 mg/dL — ABNORMAL HIGH (ref 6–23)
CO2: 23 mEq/L (ref 19–32)
Calcium: 8.7 mg/dL (ref 8.4–10.5)
Chloride: 100 mEq/L (ref 96–112)
Creat: 0.78 mg/dL (ref 0.50–1.10)
Glucose, Bld: 85 mg/dL (ref 70–99)
Potassium: 3.2 mEq/L — ABNORMAL LOW (ref 3.5–5.3)
Sodium: 136 mEq/L (ref 135–145)
Total Bilirubin: 1 mg/dL (ref 0.2–1.2)
Total Protein: 6.8 g/dL (ref 6.0–8.3)

## 2014-10-04 ENCOUNTER — Other Ambulatory Visit: Payer: Self-pay

## 2014-10-04 MED ORDER — POTASSIUM CHLORIDE ER 10 MEQ PO TBCR: 10.0000 meq | EXTENDED_RELEASE_TABLET | Freq: Every day | ORAL | Status: DC

## 2014-10-04 NOTE — Telephone Encounter (Signed)
-----   Message from Megan Salon, MD sent at 10/03/2014 11:58 PM EDT ----- Please inform pt her potassium level is about the same.  She needs to be on daily potassium and repeat level in a month.  76meq with repeat BMP 1 month.  Has she decided about new PCP?  I will want her to follow up with PCP as well but she may not have made this decision.  No orders placed yet.  We can discuss if you need before calling pt.

## 2014-10-04 NOTE — Telephone Encounter (Signed)
Dr Sabra Heck, I sent a message to you through result note, but did not put RX in. Will you please sign off on this if correct.//

## 2014-10-22 ENCOUNTER — Emergency Department (INDEPENDENT_AMBULATORY_CARE_PROVIDER_SITE_OTHER): Payer: BLUE CROSS/BLUE SHIELD

## 2014-10-22 ENCOUNTER — Encounter: Payer: Self-pay | Admitting: *Deleted

## 2014-10-22 ENCOUNTER — Emergency Department
Admission: EM | Admit: 2014-10-22 | Discharge: 2014-10-22 | Disposition: A | Discharge status: Home / Self Care | Payer: BLUE CROSS/BLUE SHIELD | Source: Home / Self Care | Attending: Emergency Medicine | Admitting: Emergency Medicine

## 2014-10-22 DIAGNOSIS — M25531 Pain in right wrist: Secondary | ICD-10-CM

## 2014-10-22 HISTORY — DX: Neoplasm of unspecified behavior of bone, soft tissue, and skin: D49.2

## 2014-10-22 IMAGING — CR DG WRIST COMPLETE 3+V*R*
4 series · 4 of 4 positions shown · non-contrast
Comparison: None.

CLINICAL DATA: RIGHT wrist pain for 2 weeks. Ulnar sided wrist
pain. Weight lifting injury.

EXAM:
RIGHT WRIST - COMPLETE 3+ VIEW

[wrist pa]
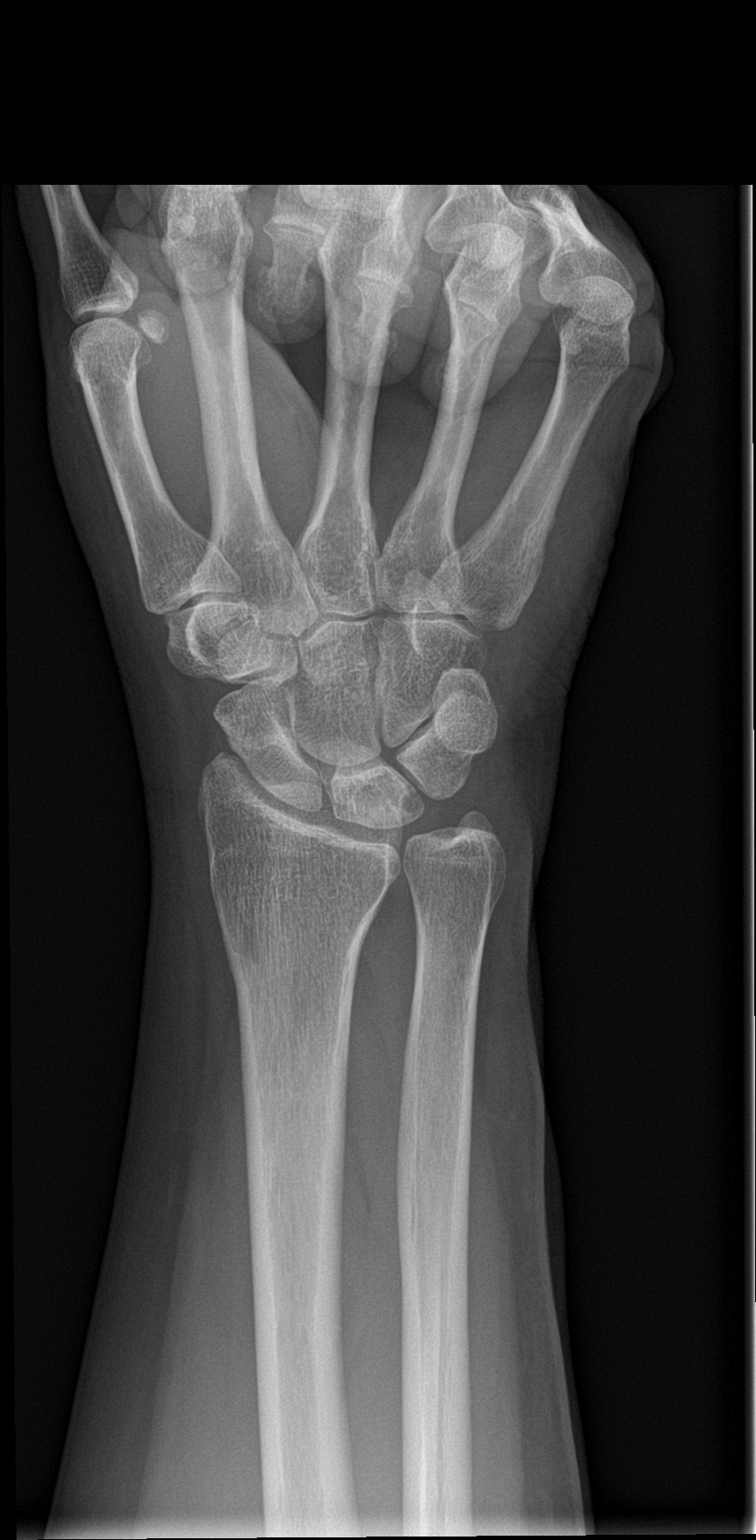

[wrist obl]
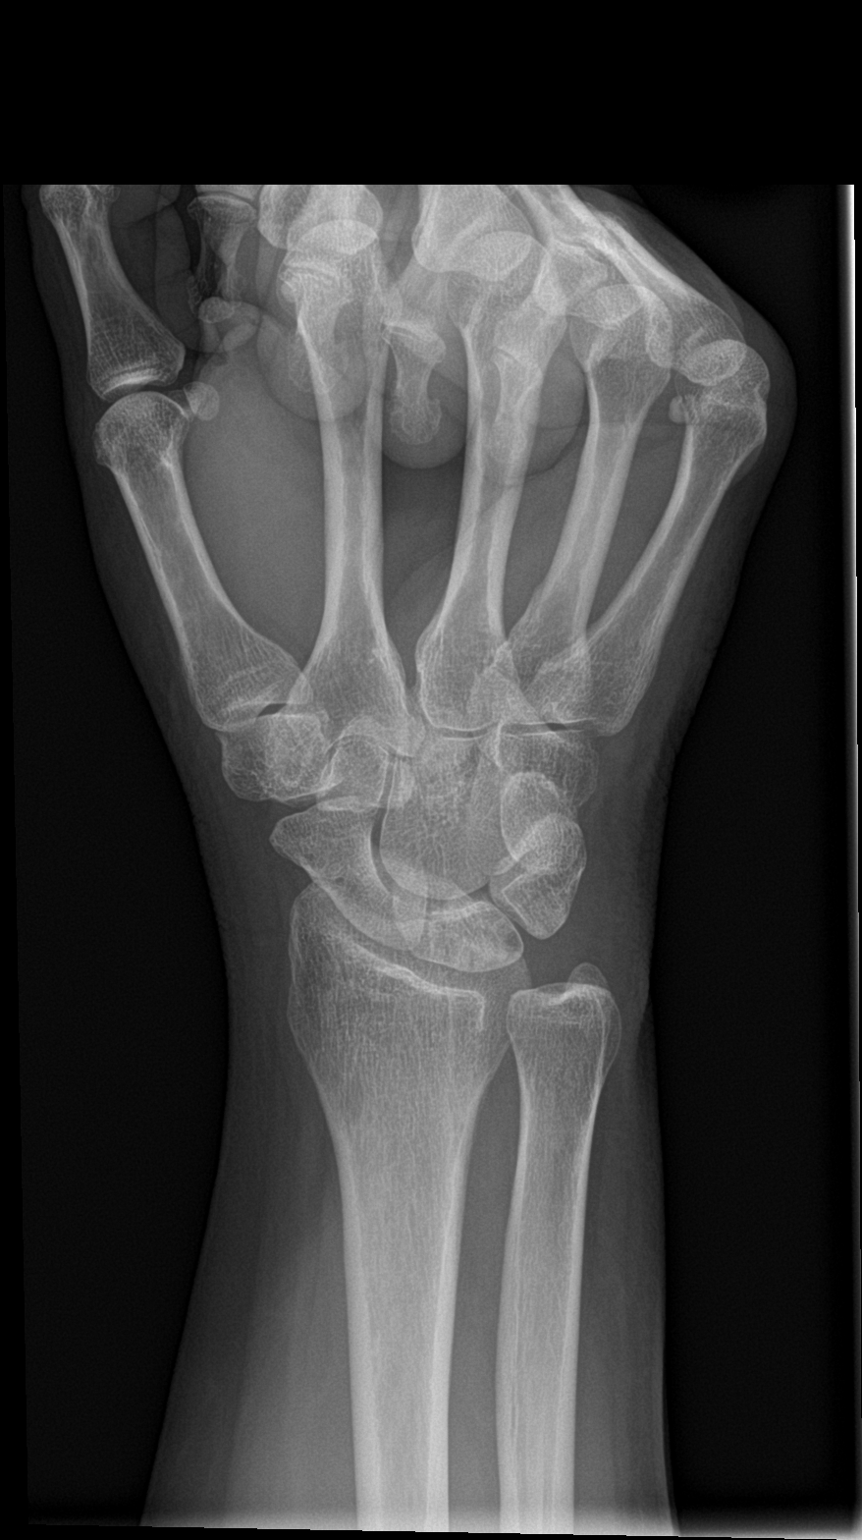

[wrist lat]
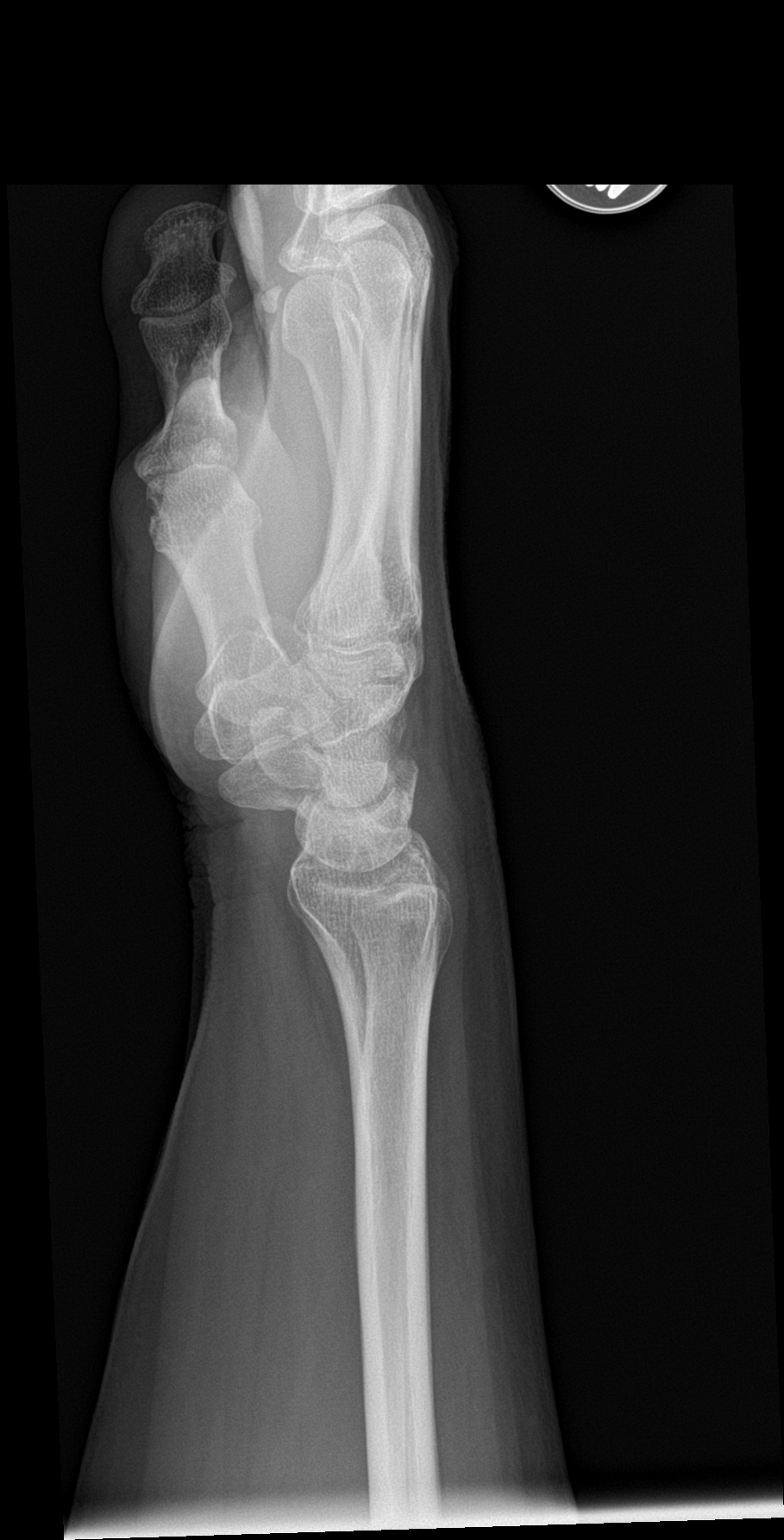

[wrist navicular]
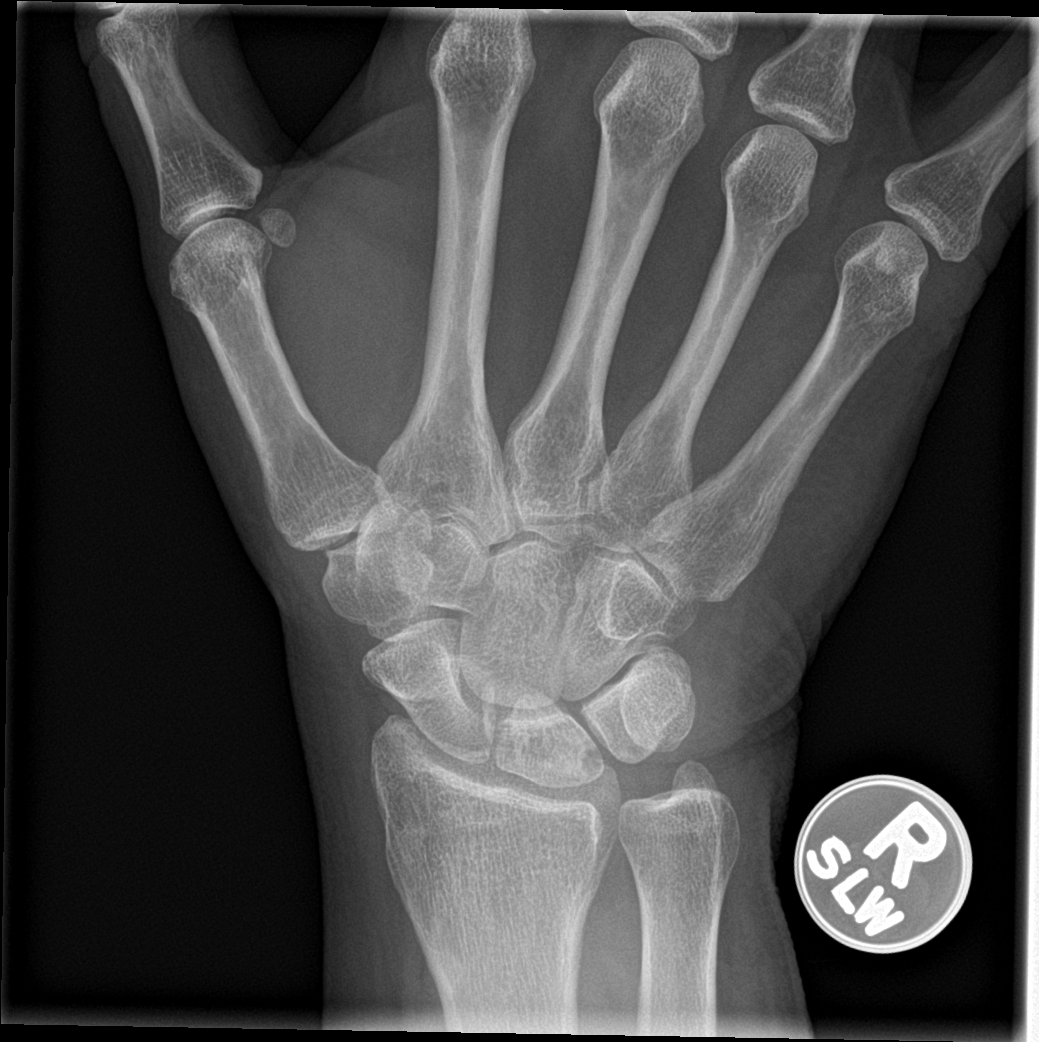

[4 of 4 positions shown; findings below may reference images not displayed]

FINDINGS: Anatomic alignment of the wrist. Ulnar positive variance is present.
There is a small cyst in the ulnar aspect of the lunate bone and in
the setting of ulnar positive variance and ulnar wrist pain,
ulnocarpal abutment should be considered. Mild STT joint
osteoarthritis. Negative for fracture. Scaphoid bone intact.
IMPRESSION: No acute abnormality.  Radiographic findings of ulnocarpal abutment.

## 2014-10-22 NOTE — ED Notes (Signed)
Pt c/o right wrist pain x 2 weeks. She does kickboxing and believes she injured it exercising. Previous fx to radial side. Used ice/heat and voltaren gel.

## 2014-10-22 NOTE — ED Provider Notes (Addendum)
CSN: [id number]     Arrival date & time 10/22/14  1849 History   First MD Initiated Contact with Patient 10/22/14 1916     Chief Complaint  Patient presents with  . Wrist Pain   (Consider location/radiation/quality/duration/timing/severity/associated sxs/prior Treatment) HPI This is a right-hand dominant female with a remote history of thumb fracture who presents with ulnar-sided wrist pain for last 2 weeks.  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  No known particular injury.  Pain is located on her aspect, worse with movement, better with rest.  She went to her PCP who thought it was carpal tunnel and gave her a thumb spica brace which has not helped.  Pain currently moderate, constant.  Of note, she is undergoing treatment for an acoustic schwannoma and is taking prednisone for that.  Also has full tear and that does not seem to be helping much.  Past Medical History  Diagnosis Date  . Diabetes mellitus   . GERD (gastroesophageal reflux disease)   . Asthma   . Iron deficiency anemia, unspecified 02/18/2013  . Constipation, chronic     IBS, CIC  . Obesity     296 lbs (highest weight in 2012)   . Chronic back pain   . Tendonitis, Achilles, left   . ADD (attention deficit disorder)     and OCD-on Vyvanse  . Ear tumors    Past Surgical History  Procedure Laterality Date  . Cholecystectomy    . Ovarian cyst removal      x4  . Back surgery    . Achilles tendon repair      x2  . Ablation      and bladder sling  . Iron infusion    . Anal fissure repair  2012  . Toe fusion Left   . Mouth surgery  2015    3 implants   Family History  Problem Relation Age of Onset  . Thyroid disease Mother   . Heart Problems Mother     vavle issue  . Dystonia Mother   . Hypercholesterolemia Mother   . Heart attack Father   . Cancer Father     esophageal  . Lung cancer Paternal Grandfather   . Heart disease Paternal Grandfather   . Heart disease Other    paternal and maternal side  . Heart disease Maternal Grandfather   . Heart disease Paternal Grandmother    History  Substance Use Topics  . Smoking status: Never Smoker   . Smokeless tobacco: Never Used  . Alcohol Use: 0.5 - 1.0 oz/week    1-2 Standard drinks or equivalent per week   OB History    Gravida Para Term Preterm AB TAB SAB Ectopic Multiple Living   0 0 0 0 0 0 0 0 0 0      Review of Systems  All other systems reviewed and are negative.   Allergies  Mobic  Home Medications   Prior to Admission medications   Medication Sig Start Date End Date Taking? Authorizing Provider  albuterol (PROVENTIL HFA;VENTOLIN HFA) 108 (90 BASE) MCG/ACT inhaler Inhale 2 puffs into the lungs every 6 (six) hours as needed for wheezing. 07/16/13 07/16/14  Jonathon Resides, MD  cyclobenzaprine (FLEXERIL) 10 MG tablet as needed.  05/25/13   Historical Provider, MD  dexlansoprazole (DEXILANT) 60 MG capsule Take 60 mg by mouth daily.    Historical Provider, MD  docusate sodium (COLACE) 100 MG capsule Take 100 mg by mouth.  Every other day    Historical Provider, MD  estradiol (ESTRACE) 0.1 MG/GM vaginal cream 1 gm vaginally twice weekly Patient not taking: Reported on 08/26/2014 06/15/14   Megan Salon, MD  Fluticasone-Salmeterol (ADVAIR) 100-50 MCG/DOSE AEPB Inhale 1 puff into the lungs 2 (two) times daily.    Historical Provider, MD  LINZESS 290 MCG CAPS capsule Take 290 mcg by mouth daily.  02/06/13   Historical Provider, MD  potassium chloride (K-DUR) 10 MEQ tablet Take 1 tablet (10 mEq total) by mouth daily. 10/04/14   Megan Salon, MD  triamterene-hydrochlorothiazide (MAXZIDE-25) 37.5-25 MG per tablet Take 1 tablet by mouth daily.    Historical Provider, MD  valACYclovir (VALTREX) 500 MG tablet Take 1 tablet (500 mg total) by mouth daily. Increase to BID x 3 days with any outbreaks. 08/26/14   Megan Salon, MD  VYVANSE 50 MG capsule  05/16/14   Historical Provider, MD   BP 132/84 mmHg  Pulse 62   Resp 14  Wt 205 lb (92.987 kg)  SpO2 100% Physical Exam  Constitutional: She is oriented to person, place, and time. She appears well-developed and well-nourished.  HENT:  Head: Normocephalic and atraumatic.  Eyes: No scleral icterus.  Neck: Neck supple.  Cardiovascular: Regular rhythm and normal heart sounds.   Pulmonary/Chest: Effort normal and breath sounds normal. No respiratory distress.  Musculoskeletal:  Right wrist: Full range of motion.  Distal neurovascular status is intact.  She is tender to palpation over the TFCC, distal ulna styloid, and FCU tendons.  No swelling, no ecchymoses.  Resisted ulnar deviation is minimally painful.  Axial loading and rocking wrist is not painful.  Neurological: She is alert and oriented to person, place, and time.  Skin: Skin is warm and dry.  Psychiatric: She has a normal mood and affect. Her speech is normal.  Nursing note and vitals reviewed.   ED Course  Procedures (including critical care time) Labs Review Labs Reviewed - No data to display  Imaging Review Dg Wrist Complete Right  10/22/2014   CLINICAL DATA:  RIGHT wrist pain for 2 weeks. Ulnar sided wrist pain. Weight lifting injury.  EXAM: RIGHT WRIST - COMPLETE 3+ VIEW  COMPARISON:  None.  FINDINGS: Anatomic alignment of the wrist. Ulnar positive variance is present. There is a small cyst in the ulnar aspect of the lunate bone and in the setting of ulnar positive variance and ulnar wrist pain, ulnocarpal abutment should be considered. Mild STT joint osteoarthritis. Negative for fracture. Scaphoid bone intact.  IMPRESSION: No acute abnormality.  Radiographic findings of ulnocarpal abutment.   Electronically Signed   By: Dereck Ligas M.D.   On: 10/22/2014 19:55     MDM   1. Right wrist pain    X-ray was obtained of the right wrist and read by radiology as above.  The differential diagnosis includes TFCC versus FCU tendinitis.  Would like to have her wear a cockup wrist splint for the  next 2 weeks.  Can continue to use her prednisone until gone as well as her Voltaren gel.  If not improving, could consider steroid injection versus formal physical therapy.  Follow-up as directed.    Janeann Forehand, MD 10/22/14 1944  Janeann Forehand, MD 10/22/14 [phone]

## 2014-10-29 ENCOUNTER — Other Ambulatory Visit: Payer: Self-pay | Admitting: Obstetrics & Gynecology

## 2014-10-29 NOTE — Telephone Encounter (Signed)
Medication refill request: Klor-Con 10 Last AEX:  06/15/14 SM Next AEX: 08/12/15 SM Last MMG (if hormonal medication request): 04/2012 [id number]:Neg Refill authorized: 10/04/14 #[id number]/0 refills. Please advise.

## 2014-11-03 ENCOUNTER — Other Ambulatory Visit: Payer: BLUE CROSS/BLUE SHIELD

## 2014-12-10 DIAGNOSIS — D333 Benign neoplasm of cranial nerves: Secondary | ICD-10-CM | POA: Insufficient documentation

## 2015-01-19 ENCOUNTER — Emergency Department (INDEPENDENT_AMBULATORY_CARE_PROVIDER_SITE_OTHER): Payer: BLUE CROSS/BLUE SHIELD

## 2015-01-19 ENCOUNTER — Emergency Department
Admission: EM | Admit: 2015-01-19 | Discharge: 2015-01-19 | Disposition: A | Discharge status: Home / Self Care | Payer: BLUE CROSS/BLUE SHIELD | Source: Home / Self Care | Attending: Family Medicine | Admitting: Family Medicine

## 2015-01-19 DIAGNOSIS — J069 Acute upper respiratory infection, unspecified: Secondary | ICD-10-CM | POA: Diagnosis not present

## 2015-01-19 DIAGNOSIS — R05 Cough: Secondary | ICD-10-CM

## 2015-01-19 DIAGNOSIS — Z8709 Personal history of other diseases of the respiratory system: Secondary | ICD-10-CM

## 2015-01-19 DIAGNOSIS — R053 Chronic cough: Secondary | ICD-10-CM

## 2015-01-19 IMAGING — CR DG CHEST 2V
2 series · 2 of 2 positions shown · non-contrast
Comparison: Chest x-ray of [DATE]

CLINICAL DATA: Hacking cough since [REDACTED] with progressive symptoms
not responsive cough medicine; history of asthma

EXAM:
CHEST  2 VIEW

[chest pa]
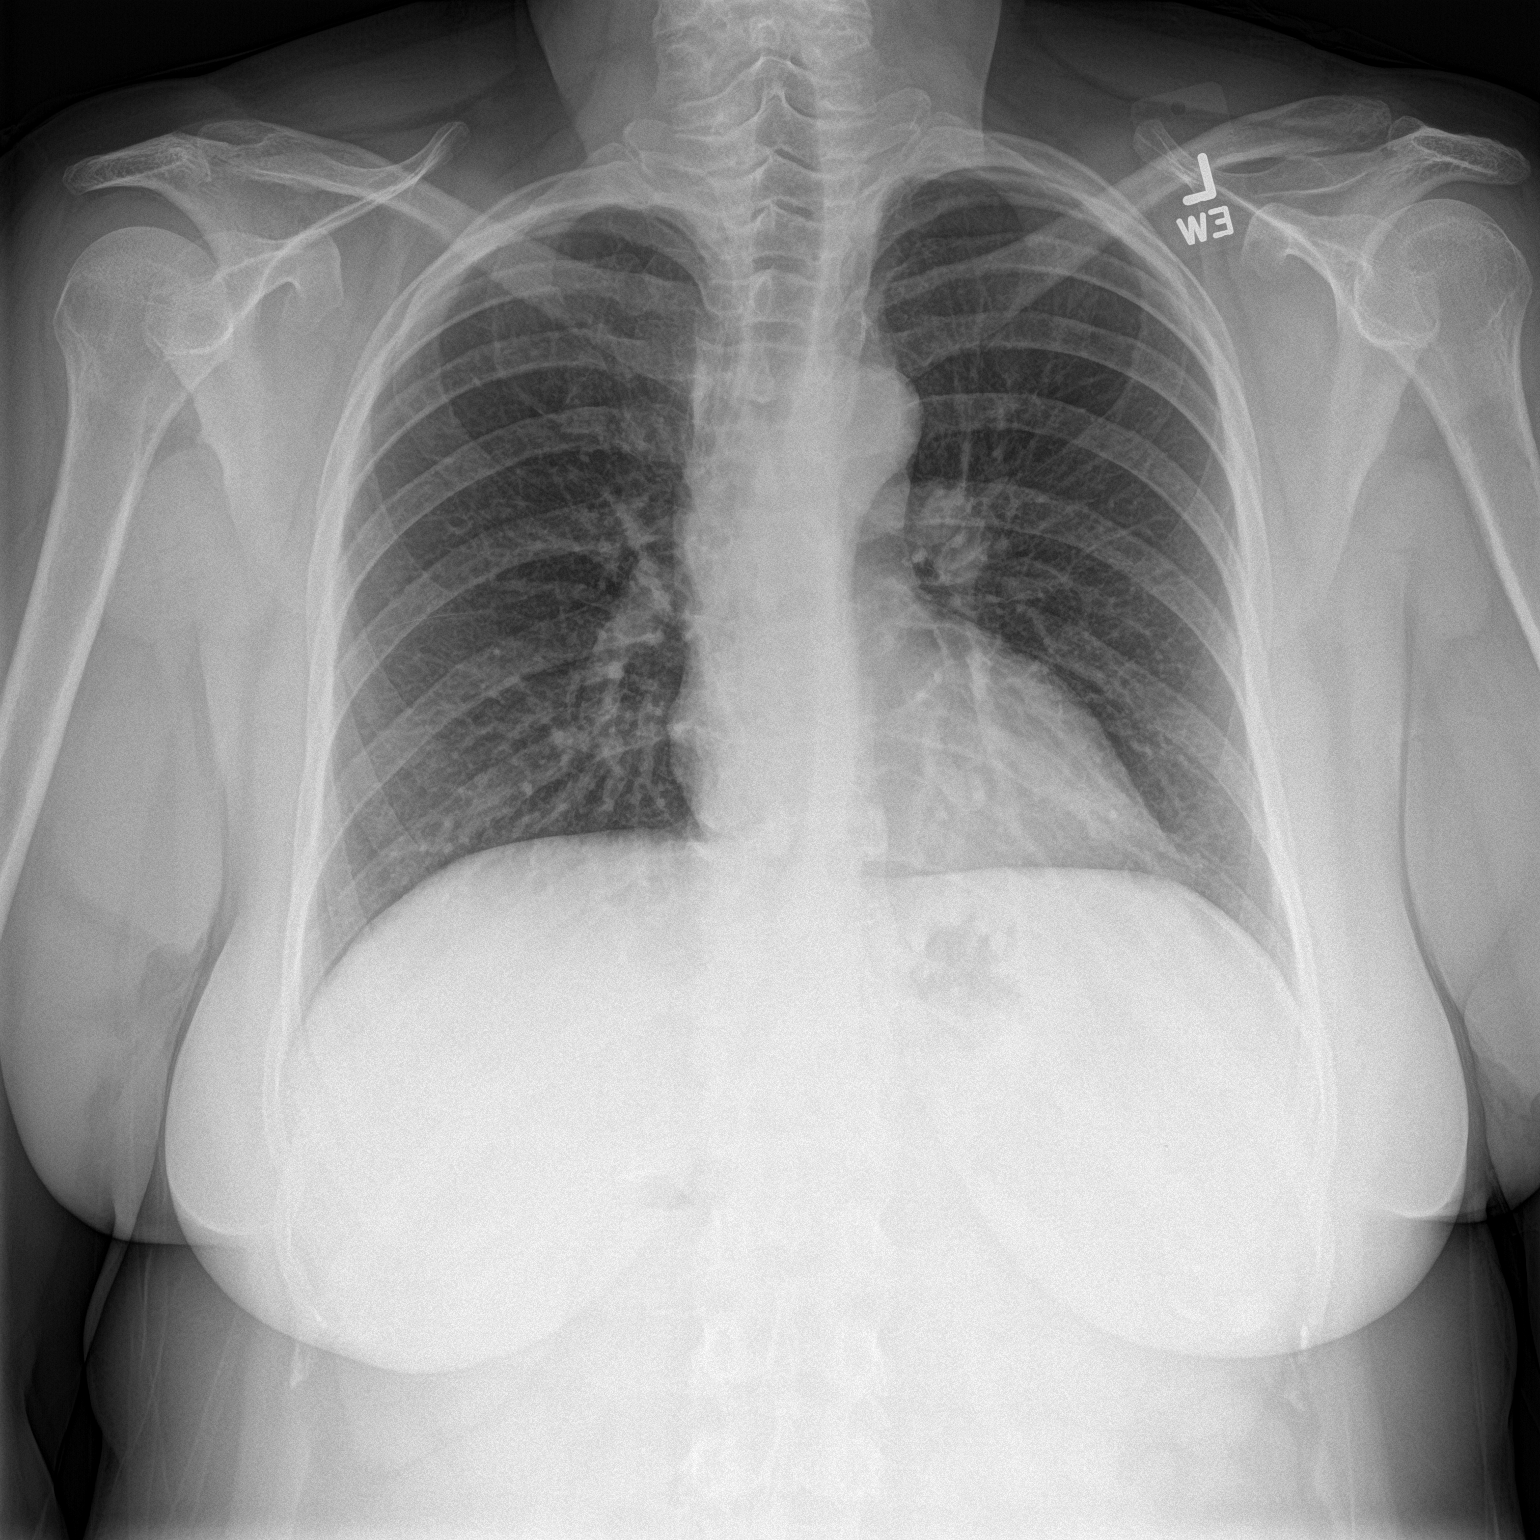

[chest lat]
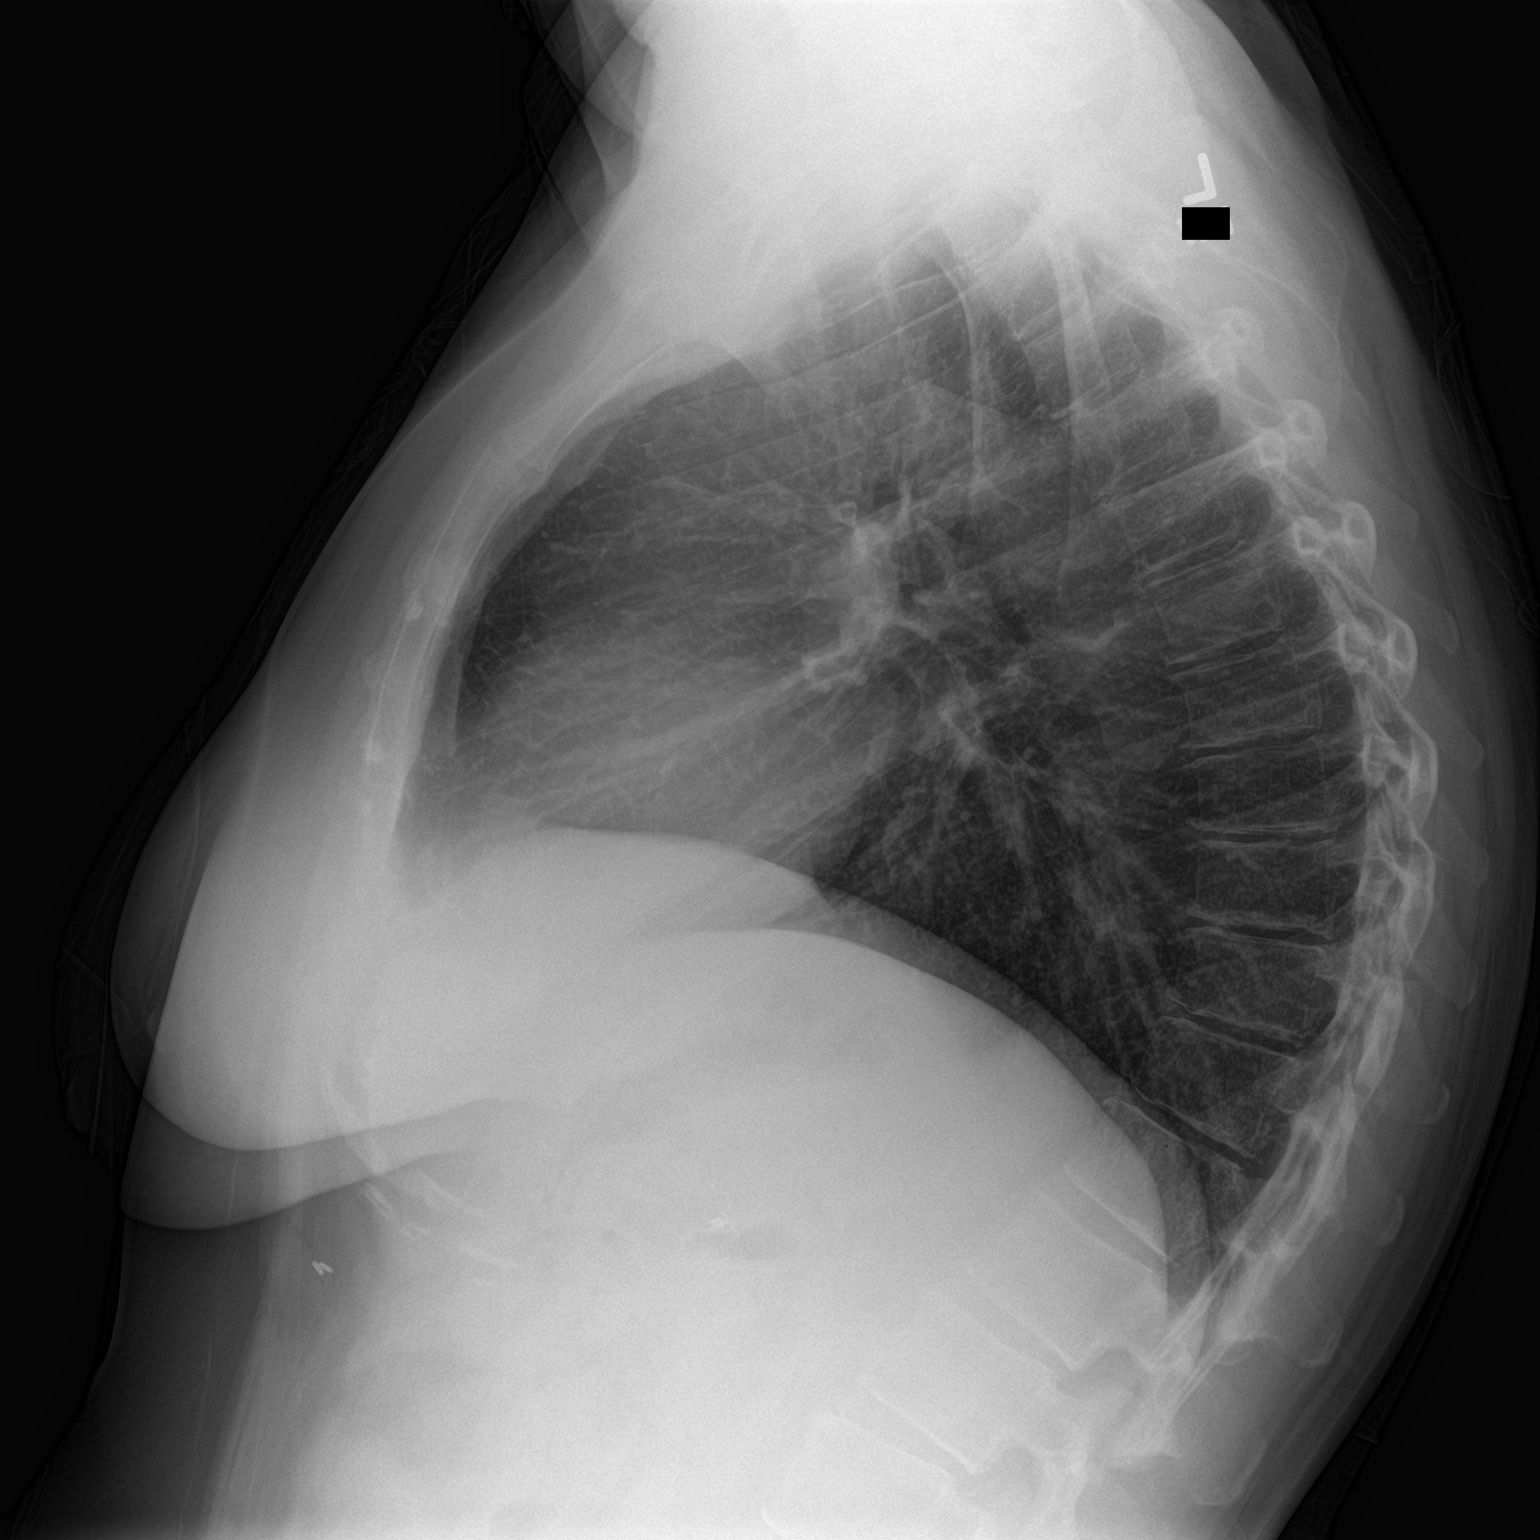

[2 of 2 positions shown; findings below may reference images not displayed]

FINDINGS: The lungs are adequately inflated and clear. The heart and pulmonary
vascularity are normal. The mediastinum is normal in width. There is
no pleural effusion or pneumothorax. The bony thorax is
unremarkable.
IMPRESSION: There is no active cardiopulmonary disease.

## 2015-01-19 MED ORDER — SALINE SPRAY 0.65 % NA SOLN: 1.0000 | NASAL | Status: DC | PRN

## 2015-01-19 MED ORDER — IPRATROPIUM-ALBUTEROL 0.5-2.5 (3) MG/3ML IN SOLN: 3.0000 mL | Freq: Four times a day (QID) | RESPIRATORY_TRACT | Status: DC

## 2015-01-19 MED ORDER — PREDNISONE 20 MG PO TABS: ORAL_TABLET | ORAL | Status: DC

## 2015-01-19 MED ORDER — IPRATROPIUM-ALBUTEROL 0.5-2.5 (3) MG/3ML IN SOLN
3.0000 mL | Freq: Once | RESPIRATORY_TRACT | Status: AC
  Administered 2015-01-19: 3 mL via RESPIRATORY_TRACT

## 2015-01-19 MED ORDER — AZITHROMYCIN 250 MG PO TABS: 250.0000 mg | ORAL_TABLET | Freq: Every day | ORAL | Status: DC

## 2015-01-19 NOTE — ED Provider Notes (Signed)
CSN: [id number]     Arrival date & time 01/19/15  0806 History   First MD Initiated Contact with Patient 01/19/15 [phone]     Chief Complaint  Patient presents with  . Cough   (Consider location/radiation/quality/duration/timing/severity/associated sxs/prior Treatment) HPI The patient is a 54 year old female with hx of asthma presenting to urgent care with complaints of gradually worsening dry intermittent productive cough, at its worse today, associated fatigue, sore throat, chest tightness and wheeze.  Symptoms started on Friday 7/29.  Patient went to her PCP on Monday 8/1 and was given a nebulized treatment as well as a steroid shot.  Patient was also discharged home with a new inhaler, Combivent and cough drops.  No relief with this treatment.  Patient states she has about half of her office sick at work and was sent home yesterday due to her loud coughing.  Denies fevers, chills, nausea, vomiting or diarrhea.  No recent travel.  No rashes.  Past Medical History  Diagnosis Date  . Diabetes mellitus   . GERD (gastroesophageal reflux disease)   . Asthma   . Iron deficiency anemia, unspecified 02/18/2013  . Constipation, chronic     IBS, CIC  . Obesity     296 lbs (highest weight in 2012)   . Chronic back pain   . Tendonitis, Achilles, left   . ADD (attention deficit disorder)     and OCD-on Vyvanse  . Ear tumors    Past Surgical History  Procedure Laterality Date  . Cholecystectomy    . Ovarian cyst removal      x4  . Back surgery    . Achilles tendon repair      x2  . Ablation      and bladder sling  . Iron infusion    . Anal fissure repair  2012  . Toe fusion Left   . Mouth surgery  2015    3 implants   Family History  Problem Relation Age of Onset  . Thyroid disease Mother   . Heart Problems Mother     vavle issue  . Dystonia Mother   . Hypercholesterolemia Mother   . Heart attack Father   . Cancer Father     esophageal  . Lung cancer Paternal Grandfather   .  Heart disease Paternal Grandfather   . Heart disease Other     paternal and maternal side  . Heart disease Maternal Grandfather   . Heart disease Paternal Grandmother    History  Substance Use Topics  . Smoking status: Never Smoker   . Smokeless tobacco: Never Used  . Alcohol Use: 0.5 - 1.0 oz/week    1-2 Standard drinks or equivalent per week   OB History    Gravida Para Term Preterm AB TAB SAB Ectopic Multiple Living   0 0 0 0 0 0 0 0 0 0      Review of Systems  Constitutional: Positive for fatigue. Negative for fever and chills.  HENT: Positive for sore throat. Negative for congestion, ear pain, trouble swallowing and voice change.   Respiratory: Positive for cough, shortness of breath and wheezing.   Cardiovascular: Positive for chest pain ( from cough). Negative for palpitations.  Gastrointestinal: Negative for nausea, vomiting, abdominal pain and diarrhea.  Musculoskeletal: Negative for myalgias, back pain and arthralgias.  Skin: Negative for rash.  Neurological: Positive for dizziness ( chronic) and headaches.  All other systems reviewed and are negative.   Allergies  Mobic  Home Medications  Prior to Admission medications   Medication Sig Start Date End Date Taking? Authorizing Provider  albuterol (PROVENTIL HFA;VENTOLIN HFA) 108 (90 BASE) MCG/ACT inhaler Inhale 2 puffs into the lungs every 6 (six) hours as needed for wheezing. 07/16/13 07/16/14  Jonathon Resides, MD  azithromycin (ZITHROMAX) 250 MG tablet Take 1 tablet (250 mg total) by mouth daily. Take first 2 tablets together, then 1 every day until finished. 01/19/15   Noland Fordyce, PA-C  cyclobenzaprine (FLEXERIL) 10 MG tablet as needed.  05/25/13   Historical Provider, MD  dexlansoprazole (DEXILANT) 60 MG capsule Take 60 mg by mouth daily.    Historical Provider, MD  docusate sodium (COLACE) 100 MG capsule Take 100 mg by mouth. Every other day    Historical Provider, MD  estradiol (ESTRACE) 0.1 MG/GM vaginal cream 1  gm vaginally twice weekly Patient not taking: Reported on 08/26/2014 06/15/14   Megan Salon, MD  Fluticasone-Salmeterol (ADVAIR) 100-50 MCG/DOSE AEPB Inhale 1 puff into the lungs 2 (two) times daily.    Historical Provider, MD  KLOR-CON 10 10 MEQ tablet TAKE 1 TABLET EVERY DAY 10/29/14   Megan Salon, MD  LINZESS 290 MCG CAPS capsule Take 290 mcg by mouth daily.  02/06/13   Historical Provider, MD  predniSONE (DELTASONE) 20 MG tablet 2 tabs po daily x 3 days 01/19/15   Noland Fordyce, PA-C  sodium chloride (OCEAN) 0.65 % SOLN nasal spray Place 1 spray into both nostrils as needed. 01/19/15   Noland Fordyce, PA-C  triamterene-hydrochlorothiazide (MAXZIDE-25) 37.5-25 MG per tablet Take 1 tablet by mouth daily.    Historical Provider, MD  valACYclovir (VALTREX) 500 MG tablet Take 1 tablet (500 mg total) by mouth daily. Increase to BID x 3 days with any outbreaks. 08/26/14   Megan Salon, MD  VYVANSE 50 MG capsule  05/16/14   Historical Provider, MD   BP 118/69 mmHg  Pulse 73  Temp(Src) 98.2 F (36.8 C) (Oral)  Ht 5\' 8"  (1.727 m)  Wt 202 lb 8 oz (91.853 kg)  BMI 30.80 kg/m2  SpO2 99% Physical Exam  Constitutional: She appears well-developed and well-nourished. No distress.  HENT:  Head: Normocephalic and atraumatic.  Right Ear: Hearing, tympanic membrane, external ear and ear canal normal.  Left Ear: Hearing, tympanic membrane, external ear and ear canal normal.  Nose: Nose normal.  Mouth/Throat: Uvula is midline, oropharynx is clear and moist and mucous membranes are normal.  Eyes: Conjunctivae are normal. No scleral icterus.  Neck: Normal range of motion. Neck supple.  Cardiovascular: Normal rate, regular rhythm and normal heart sounds.   Pulmonary/Chest: Effort normal and breath sounds normal. No respiratory distress. She has no wheezes. She has no rales. She exhibits no tenderness.  Dry intermittent cough throughout exam.  Lungs: CTAB  Abdominal: Soft. Bowel sounds are normal. She exhibits  no distension and no mass. There is no tenderness. There is no rebound and no guarding.  Musculoskeletal: Normal range of motion.  Neurological: She is alert.  Skin: Skin is warm and dry. She is not diaphoretic.  Nursing note and vitals reviewed.   ED Course  Procedures (including critical care time) Labs Review Labs Reviewed - No data to display  Imaging Review Dg Chest 2 View  01/19/2015   CLINICAL DATA:  Hacking cough since Friday with progressive symptoms not responsive cough medicine; history of asthma  EXAM: CHEST  2 VIEW  COMPARISON:  Chest x-ray of Nov 12, 2011  FINDINGS: The lungs are adequately inflated  and clear. The heart and pulmonary vascularity are normal. The mediastinum is normal in width. There is no pleural effusion or pneumothorax. The bony thorax is unremarkable.  IMPRESSION: There is no active cardiopulmonary disease.   Electronically Signed   By: David  Martinique M.D.   On: 01/19/2015 08:38     MDM   1. Acute upper respiratory infection   2. Persistent cough   3. History of asthma     The patient is a 54 year old female with history of asthma presenting to urgent care with complaints of persistent cough.  Patient has dry intermittent cough throughout exam.  However, lungs are clear to auscultation bilaterally.  Chest x-ray negative for pleural effusion or pneumothorax. Symptoms likely viral in nature due to URI.  However, due to history of asthma.  Will start patient on three-day course of by mouth prednisone and discharged home with prescription for azithromycin.  Advised she may give symptoms 2 or 3 more days to improve.  However, if not improving, she may fill prescription for azithromycin.  Encourage fluids and rest.  Follow-up with PCP within one week if not improving. Patient verbalized understanding and agreement with treatment plan.    Noland Fordyce, PA-C 01/19/15 [phone]

## 2015-01-19 NOTE — ED Notes (Signed)
Symptoms started Friday 7/29 with sore throat, saw PCP Monday, had nebulizer treatment at office, and prednisone, felt better, however coughing continued, and worse today

## 2015-02-11 ENCOUNTER — Ambulatory Visit (INDEPENDENT_AMBULATORY_CARE_PROVIDER_SITE_OTHER): Payer: BLUE CROSS/BLUE SHIELD | Admitting: Physician Assistant

## 2015-02-11 ENCOUNTER — Other Ambulatory Visit: Payer: Self-pay | Admitting: Physician Assistant

## 2015-02-11 VITALS — BP 114/72 | HR 74 | Ht 68.0 in | Wt 207.0 lb

## 2015-02-11 DIAGNOSIS — Z1231 Encounter for screening mammogram for malignant neoplasm of breast: Secondary | ICD-10-CM

## 2015-02-11 DIAGNOSIS — E876 Hypokalemia: Secondary | ICD-10-CM | POA: Diagnosis not present

## 2015-02-11 DIAGNOSIS — E010 Iodine-deficiency related diffuse (endemic) goiter: Secondary | ICD-10-CM

## 2015-02-11 DIAGNOSIS — Z23 Encounter for immunization: Secondary | ICD-10-CM

## 2015-02-11 DIAGNOSIS — E049 Nontoxic goiter, unspecified: Secondary | ICD-10-CM | POA: Diagnosis not present

## 2015-02-11 DIAGNOSIS — D333 Benign neoplasm of cranial nerves: Secondary | ICD-10-CM | POA: Diagnosis not present

## 2015-02-11 MED ORDER — LISDEXAMFETAMINE DIMESYLATE 60 MG PO CAPS: 60.0000 mg | ORAL_CAPSULE | ORAL | Status: DC

## 2015-02-11 NOTE — Patient Instructions (Signed)
Get mammogram.  Ordered thyroid ultrasound.

## 2015-02-13 ENCOUNTER — Encounter: Payer: Self-pay | Admitting: Physician Assistant

## 2015-02-13 DIAGNOSIS — E876 Hypokalemia: Secondary | ICD-10-CM | POA: Insufficient documentation

## 2015-02-13 DIAGNOSIS — E010 Iodine-deficiency related diffuse (endemic) goiter: Secondary | ICD-10-CM | POA: Insufficient documentation

## 2015-02-13 NOTE — Progress Notes (Signed)
Subjective:    Patient ID: Cynthia [name], female    DOB: 08/16/1960, 54 y.o.   MRN: [id number]  HPI  Pt presents to the clinic to establish care.   .. Active Ambulatory Problems    Diagnosis Date Noted  . Iron deficiency anemia, unspecified 02/18/2013  . IFG (impaired fasting glucose) 09/13/2013  . Backache 09/13/2013  . Wheezing 09/13/2013  . Swelling of limb 09/13/2013  . Obesity, unspecified 09/13/2013  . Interdigital neuralgia 04/14/2013  . 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 ADD (attention deficit disorder) 05/11/2014  . LBP (low back pain) 05/11/2014  . Chronic constipation 05/11/2014  . Essential (primary) hypertension 05/11/2014  . Gastro-esophageal reflux disease without esophagitis 05/11/2014  . Asthma, mild persistent 05/11/2014  . 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 Hypokalemia 02/13/2015  . Thyromegaly 02/13/2015   Resolved Ambulatory Problems    Diagnosis Date Noted  . No Resolved Ambulatory Problems   Past Medical History  Diagnosis Date  . Diabetes mellitus   . GERD (gastroesophageal reflux disease)   . Asthma   . Constipation, chronic   . Obesity   . Chronic back pain   . Tendonitis, Achilles, left   . Ear tumors    .[name] Kitchen Family History  Problem Relation Age of Onset  . Thyroid disease Mother   . Heart Problems Mother     vavle issue  . Dystonia Mother   . Hypercholesterolemia Mother   . Heart attack Father   . Cancer Father     esophageal  . Lung cancer Paternal Grandfather   . Heart disease Paternal Grandfather   . Heart disease Other     paternal and maternal side  . Heart disease Maternal Grandfather   . Heart disease Paternal Grandmother    .[name] Kitchen Social History   Social History  . Marital Status: Married    Spouse Name: N/A  . Number of Children: N/A  . Years of Education: N/A   Occupational History  . Not on file.   Social History Main Topics  . Smoking status: Never Smoker   . Smokeless tobacco: Never Used  . Alcohol Use: 0.5 -  1.0 oz/week    1-2 Standard drinks or equivalent per week  . Drug Use: No  . Sexual Activity:    Partners: Male    Birth Control/ Protection: Other-see comments     Comment: vasectomy   Other Topics Concern  . Not on file   Social History Narrative   Marital Status:  Married Information systems manager)    Children:  None    Pets: Dog (01)    Living Situation: Lives with husband     Occupation:  IT - [employer]    Education:  Master's Degree    Tobacco Use/Exposure:  None    Alcohol Use:  Occasional   Drug Use:  None   Diet:  Regular   Exercise:  Kick-boxing and boot camp - 5 times per week    Hobbies: Golf, tennis and working out               Pt has been having lots of vertigo and dizziness from left acoustic neuroma which is being managed by Dr.cunningham at [facility]. Size 54mm.   Hx of low potassium treated with potassium at some point but not taken in a few months.   ADD- needs refills. No concerns. Family has noticed a huge difference since starting.     Review of Systems  All other systems reviewed and are  negative.      Objective:   Physical Exam  Constitutional: She is oriented to person, place, and time. She appears well-developed and well-nourished.  HENT:  Head: Normocephalic and atraumatic.  Right Ear: External ear normal.  Left Ear: External ear normal.  Nose: Nose normal.  Mouth/Throat: Oropharynx is clear and moist. No oropharyngeal exudate.  Eyes: Conjunctivae and EOM are normal. Pupils are equal, round, and reactive to light.  Neck: Thyromegaly present.  Right sided thyroid fullness.   Cardiovascular: Normal rate, regular rhythm and normal heart sounds.   Pulmonary/Chest: Effort normal and breath sounds normal.  Neurological: She is alert and oriented to person, place, and time.  Psychiatric: She has a normal mood and affect. Her behavior is normal.          Assessment & Plan:  Left acoustic neuroma/vertigo- scheduled to remove in November.  Will manage symptoms until removal. antivert as needed. epley manuevers pt has.   Hypokalemia- check bmp. Long hx of low potassium.   Right sided thyromegaly- ordered u/s. Recent TSH was normal.  ADD- refilled vyvanse for 3 months. Follow up in 3 months.

## 2015-02-22 ENCOUNTER — Other Ambulatory Visit: Payer: Self-pay | Admitting: Physician Assistant

## 2015-02-22 ENCOUNTER — Ambulatory Visit (INDEPENDENT_AMBULATORY_CARE_PROVIDER_SITE_OTHER): Payer: BLUE CROSS/BLUE SHIELD

## 2015-02-22 DIAGNOSIS — E049 Nontoxic goiter, unspecified: Secondary | ICD-10-CM

## 2015-02-22 DIAGNOSIS — E010 Iodine-deficiency related diffuse (endemic) goiter: Secondary | ICD-10-CM

## 2015-02-22 DIAGNOSIS — Z1231 Encounter for screening mammogram for malignant neoplasm of breast: Secondary | ICD-10-CM

## 2015-02-23 ENCOUNTER — Other Ambulatory Visit: Payer: Self-pay | Admitting: Physician Assistant

## 2015-02-23 DIAGNOSIS — E041 Nontoxic single thyroid nodule: Secondary | ICD-10-CM

## 2015-02-23 LAB — BASIC METABOLIC PANEL WITH GFR
BUN: 19 mg/dL (ref 7–25)
CO2: 28 mmol/L (ref 20–31)
Calcium: 8.9 mg/dL (ref 8.6–10.4)
Chloride: 100 mmol/L (ref 98–110)
Creat: 0.76 mg/dL (ref 0.50–1.05)
GFR, Est African American: >89 mL/min (ref >=60)
GFR, Est Non African American: >89 mL/min (ref >=60)
Glucose, Bld: 94 mg/dL (ref 65–99)
Potassium: 3.4 mmol/L — ABNORMAL LOW (ref 3.5–5.3)
Sodium: 138 mmol/L (ref 135–146)

## 2015-02-24 ENCOUNTER — Other Ambulatory Visit: Payer: Self-pay | Admitting: Physician Assistant

## 2015-02-24 DIAGNOSIS — E041 Nontoxic single thyroid nodule: Secondary | ICD-10-CM

## 2015-02-25 LAB — T4, FREE: Free T4: 1.13 ng/dL (ref 0.80–1.80)

## 2015-02-25 LAB — TSH: TSH: 2.669 u[IU]/mL (ref 0.350–4.500)

## 2015-02-28 ENCOUNTER — Other Ambulatory Visit: Payer: Self-pay | Admitting: *Deleted

## 2015-02-28 MED ORDER — CELECOXIB 200 MG PO CAPS: 200.0000 mg | ORAL_CAPSULE | Freq: Two times a day (BID) | ORAL | Status: DC

## 2015-03-10 ENCOUNTER — Telehealth: Payer: Self-pay | Admitting: *Deleted

## 2015-03-10 NOTE — Telephone Encounter (Signed)
Authorization for Celebrex approved through [facility].  Case/approval # is [id number].  Approval dates are 02/08/15-03/09/16.  Pt notified.

## 2015-03-24 ENCOUNTER — Ambulatory Visit: Payer: BLUE CROSS/BLUE SHIELD | Admitting: Physician Assistant

## 2015-03-29 ENCOUNTER — Ambulatory Visit
Admission: RE | Admit: 2015-03-29 | Discharge: 2015-03-29 | Disposition: A | Discharge status: Home / Self Care | Payer: BLUE CROSS/BLUE SHIELD | Source: Ambulatory Visit | Attending: Physician Assistant | Admitting: Physician Assistant

## 2015-03-29 ENCOUNTER — Other Ambulatory Visit (HOSPITAL_COMMUNITY)
Admission: RE | Admit: 2015-03-29 | Discharge: 2015-03-29 | Disposition: A | Discharge status: Home / Self Care | Payer: BLUE CROSS/BLUE SHIELD | Source: Ambulatory Visit | Attending: Radiology | Admitting: Radiology

## 2015-03-29 DIAGNOSIS — E041 Nontoxic single thyroid nodule: Secondary | ICD-10-CM | POA: Insufficient documentation

## 2015-03-29 IMAGING — US US THYROID BIOPSY
1 series · 13 of 14 positions shown · non-contrast
Comparison: none

CLINICAL DATA: Patient with history of thyromegaly and thyroid
ultrasound on [DATE] which revealed a dominant nodule in the
right thyroid lobe measuring 2.4 cm. Request is now made for needle
aspirate biopsy of this dominant right thyroid nodule.

[Series 1: us thyroid biopsy · 0.07mm/px · 14 acquisitions, 13 frames shown]
[im 1/14]
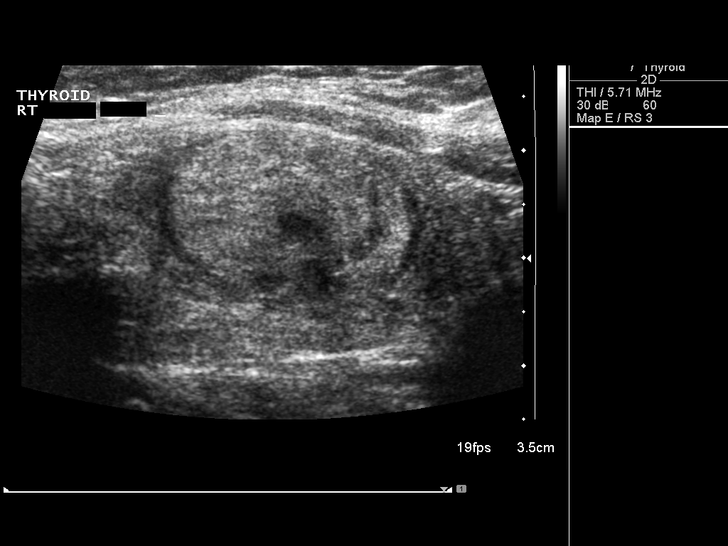
[im 2/14]
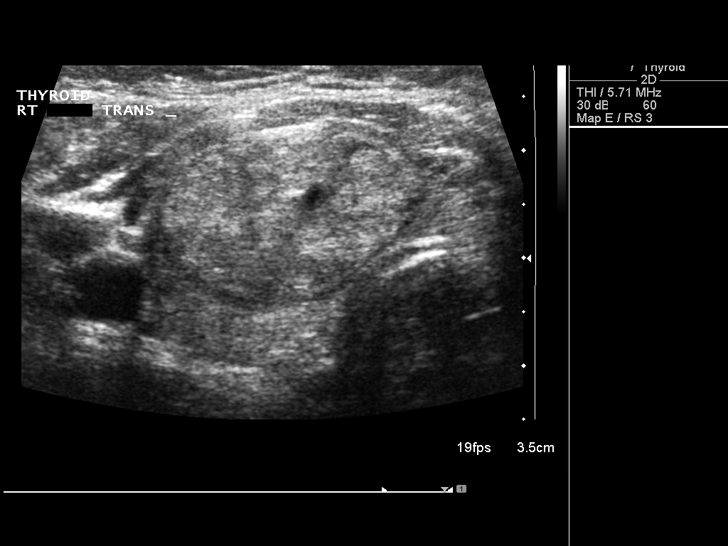
[im 3/14]
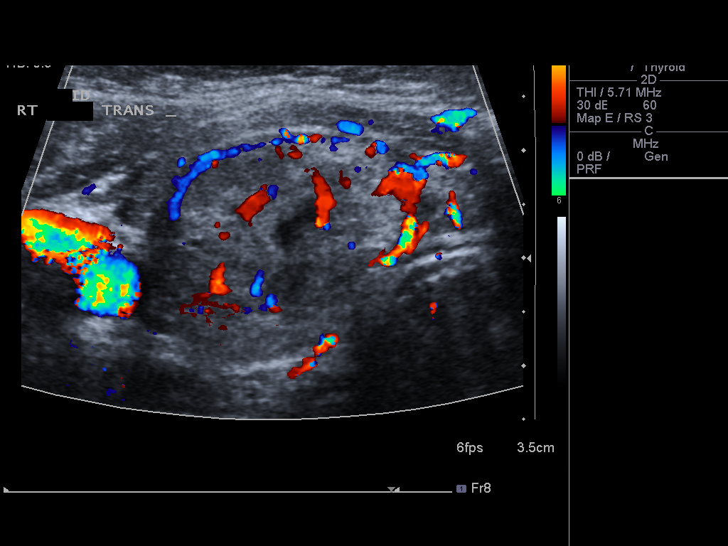
[im 4/14]
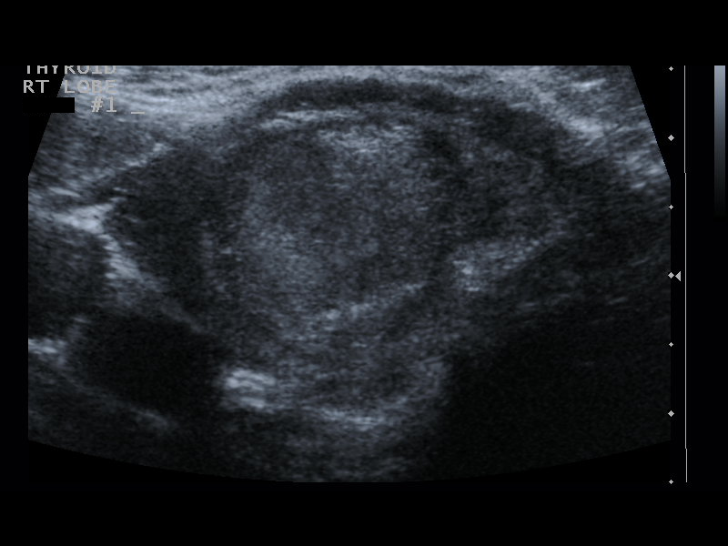
[im 5/14]
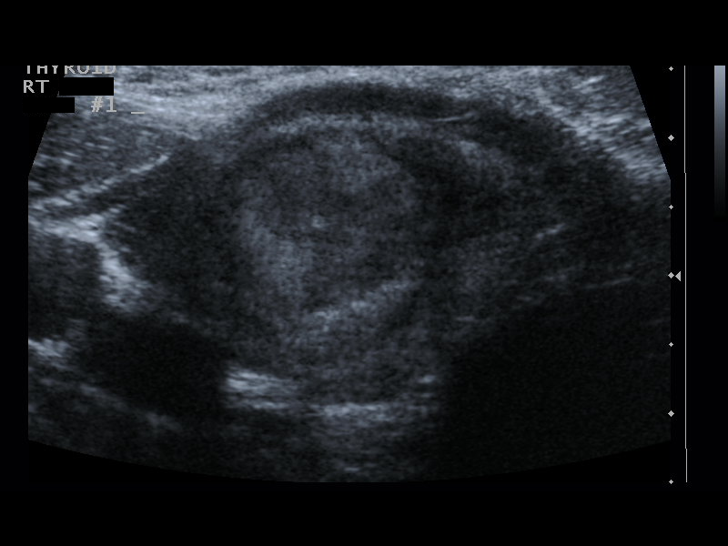
[im 6/14]
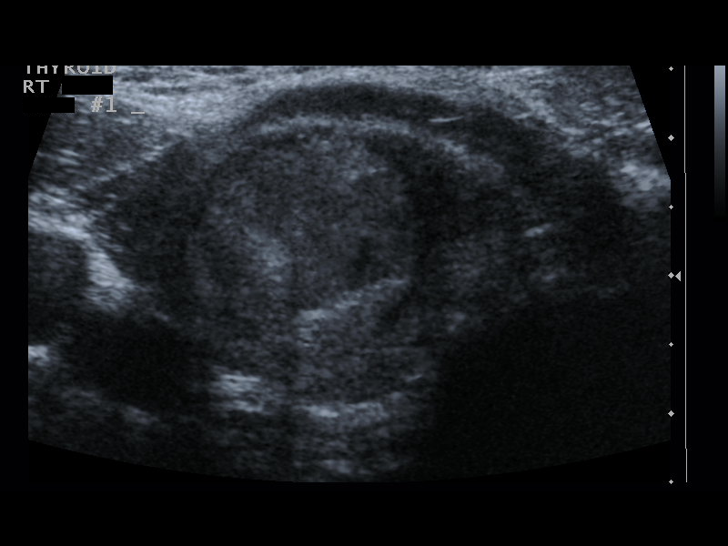
[im 8/14]
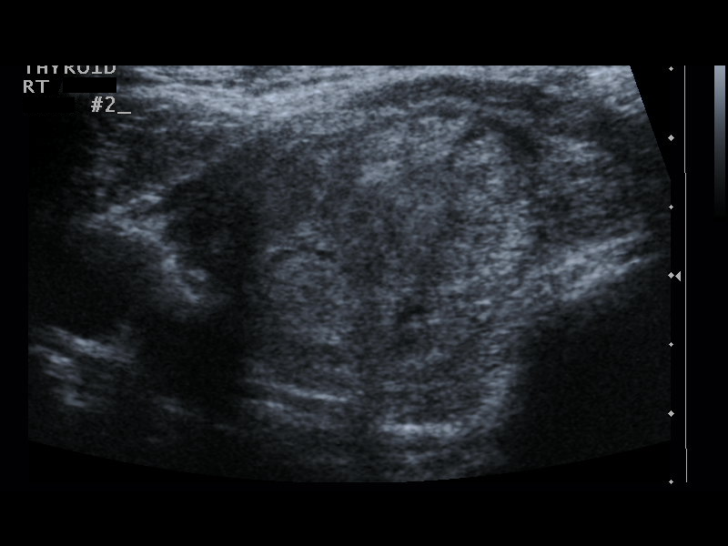
[im 9/14]
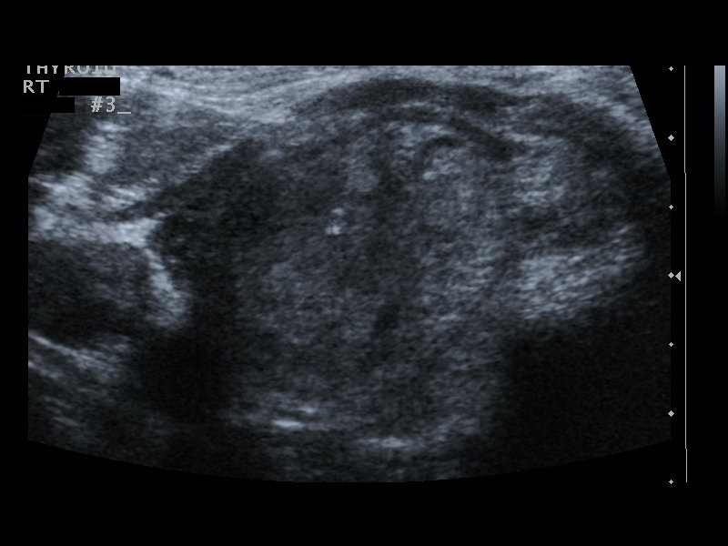
[im 10/14]
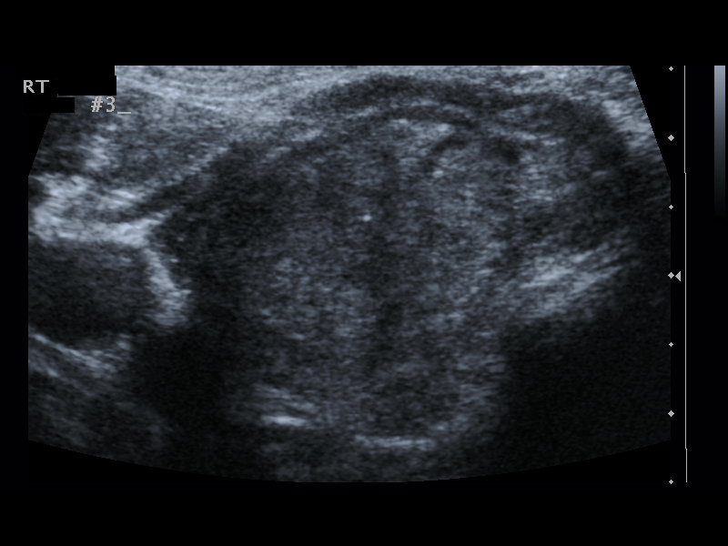
[im 11/14]
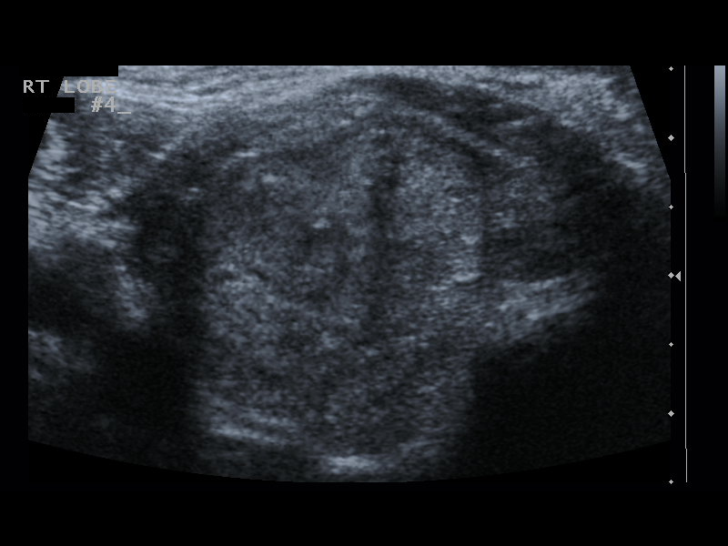
[im 12/14]
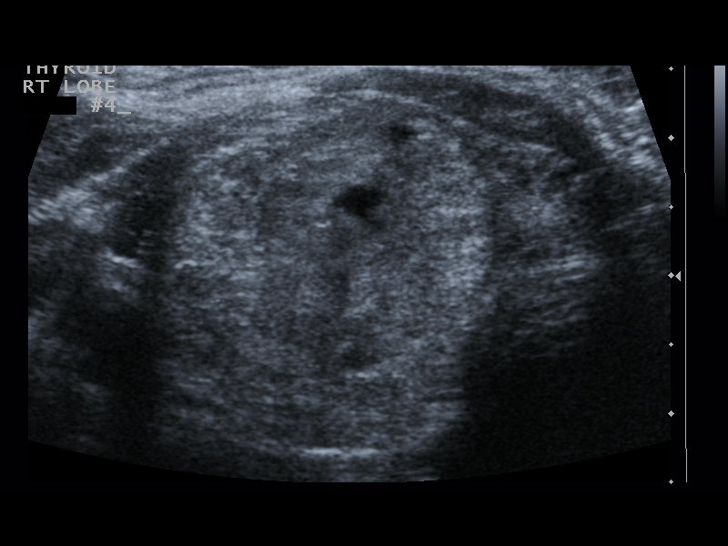
[im 13/14]
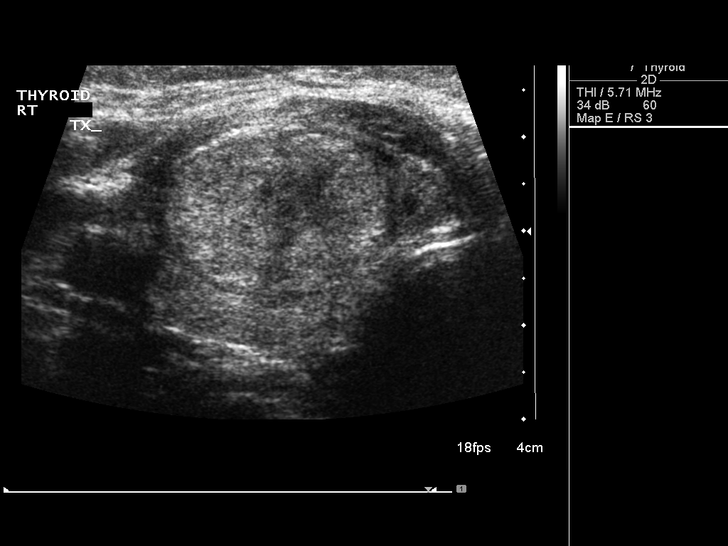
[im 14/14]
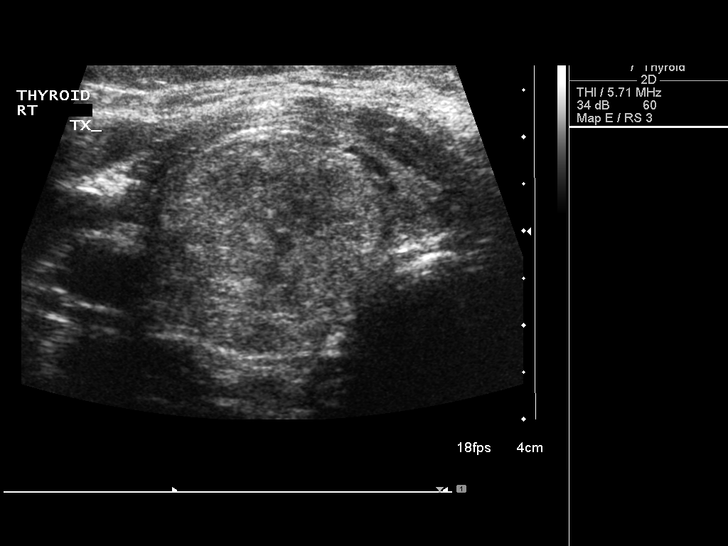

[13 of 14 positions shown; findings below may reference images not displayed]

EXAM:
ULTRASOUND GUIDED NEEDLE ASPIRATE BIOPSY OF DOMINANT RIGHT THYROID
NODULE

MEDICATIONS:
None

PROCEDURE:
Thyroid biopsy was thoroughly discussed with the patient and
questions were answered. The benefits, risks, alternatives and
complications were also discussed. The patient understands and
wishes to proceed with the procedure. Written consent was obtained.

Ultrasound was performed to localize and mark an adequate site for
the biopsy. The patient was then prepped and draped in a normal
sterile fashion. Local anesthesia was provided with 1% lidocaine.
Under direct ultrasound guidance ,4 passes were made using 25 gauge
needles into the dominant nodule within the right thyroid lobe.
Ultrasound was used to confirm needle placements on all occasions.
Specimens were sent to pathology for analysis.

COMPLICATIONS:
None.
FINDINGS: Dominant right thyroid nodule measuring 2.4 cm
IMPRESSION: Ultrasound-guided needle aspirate biopsy performed of a dominant
right thyroid nodule as discussed above. Pathology pending.

## 2015-03-31 ENCOUNTER — Other Ambulatory Visit: Payer: Self-pay | Admitting: Physician Assistant

## 2015-03-31 DIAGNOSIS — D497 Neoplasm of unspecified behavior of endocrine glands and other parts of nervous system: Secondary | ICD-10-CM | POA: Insufficient documentation

## 2015-04-20 ENCOUNTER — Encounter: Payer: Self-pay | Admitting: Physician Assistant

## 2015-04-20 ENCOUNTER — Ambulatory Visit (INDEPENDENT_AMBULATORY_CARE_PROVIDER_SITE_OTHER): Payer: BLUE CROSS/BLUE SHIELD | Admitting: Physician Assistant

## 2015-04-20 VITALS — BP 112/75 | HR 62 | Ht 68.0 in | Wt 208.0 lb

## 2015-04-20 DIAGNOSIS — F909 Attention-deficit hyperactivity disorder, unspecified type: Secondary | ICD-10-CM

## 2015-04-20 DIAGNOSIS — E876 Hypokalemia: Secondary | ICD-10-CM

## 2015-04-20 DIAGNOSIS — F988 Other specified behavioral and emotional disorders with onset usually occurring in childhood and adolescence: Secondary | ICD-10-CM

## 2015-04-20 MED ORDER — POTASSIUM CHLORIDE ER 10 MEQ PO TBCR: 10.0000 meq | EXTENDED_RELEASE_TABLET | Freq: Every day | ORAL | Status: DC

## 2015-04-20 MED ORDER — LISDEXAMFETAMINE DIMESYLATE 60 MG PO CAPS: 60.0000 mg | ORAL_CAPSULE | ORAL | Status: DC

## 2015-04-20 NOTE — Progress Notes (Addendum)
Cynthia [name] is a 54 y.o. female who presents to [facility] today for f/u of hypokalemia. Cynthia [name] states that she is scheduled to have her thyroid removed on Thursday due to thyroid cancer and as such she had labwork done on 10/27 which showed a potassium of 3.2 mEq/L. She states that the surgeon agreed to do her surgery but urged to her follow up with primary care concerning this issue. Additionally she had an EKG done on 10/27 which she reports as normal. Cynthia [name] states that she has always had low potassium and has taken supplements before, but does not like the way they make her stomach feel. She states that she has previous history of laxative abuse but is not currently using anything except Linzess for constipation. She states that she gets "charlie horses" 1-2 times week but denies palpitations, weakness, numbness, chest pain, increased fatigue, melena, hematochezia, fever or chills.  Patient also requests refills of Vyvanse, she is doing well with good attention and denies any side effects of nausea, insomnia and tachycardia.   Past Medical History  Diagnosis Date  . Diabetes mellitus   . GERD (gastroesophageal reflux disease)   . Asthma   . Iron deficiency anemia, unspecified 02/18/2013  . Constipation, chronic     IBS, CIC  . Obesity     296 lbs (highest weight in 2012)   . Chronic back pain   . Tendonitis, Achilles, left   . ADD (attention deficit disorder)     and OCD-on Vyvanse  . Ear tumors    Past Surgical History  Procedure Laterality Date  . Cholecystectomy    . Ovarian cyst removal      x4  . Back surgery    . Achilles tendon repair      x2  . Ablation      and bladder sling  . Iron infusion    . Anal fissure repair  2012  . Toe fusion Left   . Mouth surgery  2015    3 implants   Social History  Substance Use Topics  . Smoking status: Never Smoker   . Smokeless tobacco: Never Used  . Alcohol Use: 0.5 - 1.0 oz/week    1-2  Standard drinks or equivalent per week   family history includes Cancer in her father; Dystonia in her mother; Heart Problems in her mother; Heart attack in her father; Heart disease in her maternal grandfather, other, paternal grandfather, and paternal grandmother; Hypercholesterolemia in her mother; Lung cancer in her paternal grandfather; Thyroid disease in her mother.  ROS as above Medications: Current Outpatient Prescriptions  Medication Sig Dispense Refill  . celecoxib (CELEBREX) 200 MG capsule Take 1 capsule (200 mg total) by mouth 2 (two) times daily. 180 capsule 0  . dexlansoprazole (DEXILANT) 60 MG capsule Take 60 mg by mouth daily.    [name] Kitchen estradiol (ESTRACE) 0.1 MG/GM vaginal cream 1 gm vaginally twice weekly 42.5 g 4  . Fluticasone-Salmeterol (ADVAIR) 100-50 MCG/DOSE AEPB Inhale 1 puff into the lungs 2 (two) times daily.    [name] Kitchen LINZESS 290 MCG CAPS capsule Take 290 mcg by mouth daily.     [name] Kitchen lisdexamfetamine (VYVANSE) 60 MG capsule Take 1 capsule (60 mg total) by mouth every morning. Do not refill until 07/20/15 30 capsule 0  . valACYclovir (VALTREX) 500 MG tablet Take 1 tablet (500 mg total) by mouth daily. Increase to BID x 3 days with any outbreaks. 45 tablet 12  . potassium chloride (  K-DUR) 10 MEQ tablet Take 1 tablet (10 mEq total) by mouth daily. 30 tablet 2   No current facility-administered medications for this visit.   Allergies  Allergen Reactions  . Mobic [Meloxicam] Nausea And Vomiting     Exam:  BP 112/75 mmHg  Pulse 62  Ht 5\' 8"  (1.727 m)  Wt 208 lb (94.348 kg)  BMI 31.63 kg/m2 Gen: WDWN female in no acute distress.  HEENT: EOMI,  MMM Lungs: Normal work of breathing. CTABL Heart: RRR no MRG Exts: Brisk capillary refill, warm and well perfused.   Assessment: Hypokalemia based on previous lab results ADHD based on previous diagnosis  Plan: Patient prescribed potassium chloride 10 mEq tablets QD for correction of hypokalemia. Patient also given order for BMP  to be drawn in 4 weeks to check effectiveness of therapy.   Patient given refills for 3 months of lisdexamfetamine 60 mg capsules for management of ADHD.  Patient instructed to return to office or ED if symptoms worsen, patient experiences chest pain, syncope or shortness of breath. Side effects of medication explained and all questions answered. Patient conveyed understanding and agreed to current treatment plan.

## 2015-04-28 HISTORY — PX: THYROID LOBECTOMY: SHX420

## 2015-05-03 DIAGNOSIS — D34 Benign neoplasm of thyroid gland: Secondary | ICD-10-CM | POA: Insufficient documentation

## 2015-05-04 ENCOUNTER — Encounter: Payer: Self-pay | Admitting: Physician Assistant

## 2015-05-16 ENCOUNTER — Ambulatory Visit: Payer: BLUE CROSS/BLUE SHIELD | Admitting: Physician Assistant

## 2015-06-08 ENCOUNTER — Encounter: Payer: Self-pay | Admitting: Internal Medicine

## 2015-06-08 ENCOUNTER — Ambulatory Visit (INDEPENDENT_AMBULATORY_CARE_PROVIDER_SITE_OTHER): Payer: BLUE CROSS/BLUE SHIELD | Admitting: Internal Medicine

## 2015-06-08 VITALS — BP 114/62 | HR 72 | Temp 97.5°F | Resp 12 | Ht 68.0 in | Wt 209.6 lb

## 2015-06-08 DIAGNOSIS — R7301 Impaired fasting glucose: Secondary | ICD-10-CM

## 2015-06-08 DIAGNOSIS — E890 Postprocedural hypothyroidism: Secondary | ICD-10-CM

## 2015-06-08 DIAGNOSIS — R739 Hyperglycemia, unspecified: Secondary | ICD-10-CM | POA: Diagnosis not present

## 2015-06-08 DIAGNOSIS — D34 Benign neoplasm of thyroid gland: Secondary | ICD-10-CM

## 2015-06-08 DIAGNOSIS — Z9889 Other specified postprocedural states: Secondary | ICD-10-CM

## 2015-06-08 LAB — HEMOGLOBIN A1C: Hgb A1c MFr Bld: 5.4 % (ref 4.6–6.5)

## 2015-06-08 LAB — T4, FREE: Free T4: 0.87 ng/dL (ref 0.60–1.60)

## 2015-06-08 LAB — T3, FREE: T3, Free: 2.8 pg/mL (ref 2.3–4.2)

## 2015-06-08 LAB — TSH: TSH: 4.05 u[IU]/mL (ref 0.35–4.50)

## 2015-06-08 NOTE — Progress Notes (Signed)
Patient ID: Cynthia [name], female   DOB: 1961-05-16, 54 y.o.   MRN: [id number]   HPI  Cynthia [name] is a 54 y.o.-year-old female, self-referred, for management of thyroid cancer and postsurgical hypothyroidism.  She noticed a lump in her throat in 2015 and had hoarseness. Saw PCP >> thyroid U/S >> thyroid nodule >> FNA: Suspicious for follicular neoplasm >> referred to ENT >> R hemithyroidectomy (Dr Simonne Maffucci).  Thyroid neoplasm hx: 02/22/2015:  Thyroid ultrasound:  Right thyroid lobe : 6.4 x 2.5 x 2.3 cm. Heterogeneous appearance. Solid nodule measuring 2.4 x 1.8 x 1.9 centimeters, without internal calcification , with hypoechoic margin and no significant internal blood flow.  Left thyroid lobe, 5.6 x 1.7 x 1.5 cm. Heterogeneous appearance. No focal nodules.  Isthmus : 4 mm in thickness, no nodules visualized.  No lymphadenopathy visualized. 03/29/2015: Right thyroid nodule FNA: Suspicious for a follicular neoplasm (Bethesda category IV) 04/28/2015: R Hemithyroidectomy by Dr Simonne Maffucci ([facility]) Path: A. 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 Resection margins are negative. Note: Dr. Vernell Barrier has also reviewed this case and concurs. JAMA Oncol. 2016; 2(8):1023-29. A. "Right thyroid node, stitch at superior pole", in formalin. Received is a 14g, 4.5 x 3.0 x 2.5 cm lobe of thyroid. Inked as follows: Anterior - blue, posterior - black, ischemic margin - yellow. Sectioning reveals a 2.4 x 1.5 x 2.4 cm well-circumscribed nodule with a homogenous firm tan surface. The nodule is in the inferior portion of the lobe and markedly distorts the anterior and posterior capsule. In the superior portion of the lobe there are multiple white areas, which range from 0.2 to 0.7 cm in greatest dimension. Additionally received is detached brown fragment, 1.2 x 1.0 x 0.3 cm.  Block summary: A1-A2 - Full thickness section of nodule,  bisected A3-A4 - Full thickness section of nodule, bisected and including ischemic margin A5-A7 - Representative section of superior portion of thyroid A8 - Detached fragment completely submitted A9-A15 - Remainder of tumor capsule, entirely from superior to inferior  Pt denies feeling nodules in neck, hoarseness, dysphagia/odynophagia, SOB with lying down.  She is not on Levothyroxine.  I reviewed pt's thyroid tests, no recent tests from after the surgery: Lab Results  Component Value Date   TSH 2.669 02/22/2015   TSH 2.489 10/15/2013   FREET4 1.13 02/22/2015    Pt describes: - + fatigue - + weight gain - + cold intolerance - + constipation - + dry skin - + hair falling - no depression  She has + FH of thyroid disorders in: mother. No FH of thyroid cancer.  No h/o radiation tx to head or neck.  She had hyperglycemia >> last HbA1c: Lab Results  Component Value Date   HGBA1C 5.1 10/15/2013   HGBA1C 4.9 07/16/2013   She was on Metformin >> lost 80-90 lbs (heaviest: ~290 lbs) >> sugars improved >> taken off the med >> started to gain weight again.   ROS: Constitutional: see HPI , + nocturia Eyes: no blurry vision, no xerophthalmia ENT: no sore throat, no nodules palpated in throat, no dysphagia/odynophagia,  + hoarseness, + Decreased hearing, + tinnitus Cardiovascular: no CP/SOB/palpitations/leg swelling Respiratory: + both: cough/SOB -  With asthma Gastrointestinal: no N/V/D/ +C Musculoskeletal:  + muscle/ nojoint aches Skin:  + rash,  +hair loss Neurological:  + tremors/ nonumbness/tingling/dizziness , + headaches Psychiatric: no depression/anxiety  + low libido  Past Medical History  Diagnosis Date  . Diabetes mellitus   .  GERD (gastroesophageal reflux disease)   . Iron deficiency anemia, unspecified 02/18/2013  . Constipation, chronic     IBS, CIC  . Obesity     296 lbs (highest weight in 2012)   . Chronic back pain   . Tendonitis, Achilles, left   . ADD  (attention deficit disorder)     and OCD-on Vyvanse  . Ear tumors   . Asthma     w/out status asthmaticus  . Benign brain tumor ([place])   . Awareness under anesthesia   . Vertigo   . Difficult airway    Past Surgical History  Procedure Laterality Date  . Cholecystectomy    . Ovarian cyst removal      x4  . Back surgery    . Achilles tendon repair      x2  . Ablation      and bladder sling  . Iron infusion    . Anal fissure repair  2012  . Toe fusion Left   . Mouth surgery  2015    3 implants  . Thyroid lobectomy Bilateral     Total   Social History   Social History Main Topics  . Smoking status: Never Smoker   . Smokeless tobacco: Never Used  . Alcohol Use: 0.5 - 1.0 oz/week    1-2 Standard drinks or equivalent per week  . Drug Use: No  . Sexual Activity:    Partners: Male    Birth Control/ Protection: Other-see comments     Comment: vasectomy   Social History Narrative   Marital Status:  Married Information systems manager)    Children:  None    Pets: Dog (01)    Living Situation: Lives with husband     Occupation:  IT - [employer]    Education:  Master's Degree    Tobacco Use/Exposure:  None    Alcohol Use:  Occasional   Drug Use:  None   Diet:  Regular   Exercise:  Kick-boxing and boot camp - 5 times per week    Hobbies: Golf, tennis and working out   Current Outpatient Prescriptions on File Prior to Visit  Medication Sig Dispense Refill  . celecoxib (CELEBREX) 200 MG capsule Take 1 capsule (200 mg total) by mouth 2 (two) times daily. 180 capsule 0  . dexlansoprazole (DEXILANT) 60 MG capsule Take 60 mg by mouth daily.    [name] Kitchen estradiol (ESTRACE) 0.1 MG/GM vaginal cream 1 gm vaginally twice weekly 42.5 g 4  . Fluticasone-Salmeterol (ADVAIR) 100-50 MCG/DOSE AEPB Inhale 1 puff into the lungs 2 (two) times daily.    [name] Kitchen LINZESS 290 MCG CAPS capsule Take 290 mcg by mouth daily.     [name] Kitchen lisdexamfetamine (VYVANSE) 60 MG capsule Take 1 capsule (60 mg total) by mouth every  morning. Do not refill until 07/20/15 30 capsule 0  . potassium chloride (K-DUR) 10 MEQ tablet Take 1 tablet (10 mEq total) by mouth daily. (Patient not taking: Reported on 06/08/2015) 30 tablet 2   No current facility-administered medications on file prior to visit.   Allergies  Allergen Reactions  . Mobic [Meloxicam] Nausea And Vomiting   Family History  Problem Relation Age of Onset  . Thyroid disease Mother   . Heart Problems Mother     vavle issue  . Dystonia Mother   . Hypercholesterolemia Mother   . Heart attack Father   . Cancer Father     esophageal  . Heart disease Father   . Lung cancer Paternal  Grandfather   . Heart disease Paternal Grandfather   . Heart disease Other     paternal and maternal side  . Heart disease Maternal Grandfather   . Heart disease Paternal Grandmother    PE: BP 114/62 mmHg  Pulse 72  Temp(Src) 97.5 F (36.4 C) (Oral)  Resp 12  Ht 5\' 8"  (1.727 m)  Wt 209 lb 9.6 oz (95.074 kg)  BMI 31.88 kg/m2  SpO2 97% Wt Readings from Last 3 Encounters:  06/08/15 209 lb 9.6 oz (95.074 kg)  04/20/15 208 lb (94.348 kg)  02/11/15 207 lb (93.895 kg)   Constitutional: overweight, in NAD Eyes: PERRLA, EOMI, no exophthalmos ENT: moist mucous membranes,  Thyroidectomy scar healing , no keloid , no masses palpated in neck, no cervical lymphadenopathy Cardiovascular: RRR, No MRG Respiratory: CTA B Gastrointestinal: abdomen soft, NT, ND, BS+ Musculoskeletal: no deformities, strength intact in all 4 Skin: moist, warm, no rashes Neurological: no tremor with outstretched hands, DTR normal in all 4  ASSESSMENT: 1.  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  Follicular thyroid neoplasm - I had a long discussion with the patient about her recent diagnosis of thyroid  Neoplasm. - I reviewed the results of the ultrasound , FNA, and the final pathology  along with the patient - We reviewed together the pathology >>  Her tumor is the lowest risk across the thyroid cancer classification. In the new  article from [employer] (01/2015 ), data show that this entity of papillary thyroid cancer has a very benign course and should be considered a benign neoplasm >> treatment needs to be de-escalated from completion thyroidectomy and radioactive iodine treatment to just monitoring after hemithyroidectomy. The chance of recurrence is less than 1% in the next 15 years.  All these were discussed with the patient and she was given a copy of the above article.  I explained that this is not yet in the  ATA thyroid cancer management guidelines but will be for sure in the next edition. She understands the data above and agrees with only follow-up without proceeding with total thyroidectomy and radioactive iodine treatment.  2.  Status post right hemithyroidectomy -  Patient has several symptoms that could be attributed to hypothyroidism, but unclear thyroid function after her surgery. I explained that an approximate 25% of patients may develop hypothyroidism after hemithyroidectomy.  She is worried about possible diagnosis of Hashimoto's thyroiditis based on the final pathology for the right thyroid lobe, and I explained that this is an autoimmune condition which could be associated with euthyroidism for a long time before causing hypothyroidism. - We will check today: TSH, free T4, free T3, TPO, ATA antibodies - We discussed about correct intake of levothyroxine if we need to start it: fasting, with water, separated by at least 30 minutes from breakfast, and separated by more than 4 hours from calcium, iron, multivitamins, acid reflux medications (PPIs). - If  Labs are abnormal, she will need to return in  5-6 weeks for repeat labs - If these are normal, we will recheck them at next visit Return in about 4 months (around 10/07/2015).   3.  History of hyperglycemia -   Reviewed previous hemoglobin A1c levels and will obtain another one today -  She did very well on metformin before with dramatic weight loss >>  Will see if she needs this restarted.  - time spent with the  patient: 1 hour, of which >50% was spent in obtaining information about her thyroid neoplasm, reviewing her previous labs, evaluations, and treatments -  Per records sent by the patient and also reviewed in Care everywhere, counseling her about her condition (please see the discussed topics above), and developing a plan to further investigate it; she had a number of questions which I addressed.  Office Visit on 06/08/2015  Component Date Value Ref Range Status  . T3, Free 06/08/2015 2.8  2.3 - 4.2 pg/mL Final  . Free T4 06/08/2015 0.87  0.60 - 1.60 ng/dL Final  . TSH 06/08/2015 4.05  0.35 - 4.50 uIU/mL Final  . Thyroperoxidase Ab SerPl-aCnc 06/08/2015 80* <9 IU/mL Final  . Thyroglobulin Ab 06/08/2015 1  <2 IU/mL Final  . Hgb A1c MFr Bld 06/08/2015 5.4  4.6 - 6.5 % Final   Glycemic Control Guidelines for People with Diabetes:Non Diabetic:  <6%Goal of Therapy: <7%Additional Action Suggested:  >8%    High TPO antibodies >> New diagnosis of Hashimoto's thyroiditis. TSH at the upper limit of normal. I will discuss with her whether she wants to start the low-dose levothyroxine and recheck her tests in 5-6 weeks. Hemoglobin A1c normal.

## 2015-06-08 NOTE — Patient Instructions (Signed)
Please stop at the lab.  If we need to start Levothyroxine, then ake the thyroid hormone every day, with water, at least 30 minutes before breakfast, separated by at least 4 hours from: - acid reflux medications - calcium - iron - multivitamins  Please come back for a follow-up appointment in 4 months.

## 2015-06-09 LAB — THYROGLOBULIN ANTIBODY: Thyroglobulin Ab: 1 IU/mL (ref <2)

## 2015-06-09 LAB — THYROID PEROXIDASE ANTIBODY: Thyroperoxidase Ab SerPl-aCnc: 80 IU/mL — ABNORMAL HIGH (ref <9)

## 2015-06-14 ENCOUNTER — Other Ambulatory Visit: Payer: Self-pay | Admitting: Internal Medicine

## 2015-06-14 ENCOUNTER — Encounter: Payer: Self-pay | Admitting: Internal Medicine

## 2015-06-14 DIAGNOSIS — E063 Autoimmune thyroiditis: Secondary | ICD-10-CM | POA: Insufficient documentation

## 2015-06-14 MED ORDER — LEVOTHYROXINE SODIUM 25 MCG PO TABS: 25.0000 ug | ORAL_TABLET | Freq: Every day | ORAL | Status: DC

## 2015-06-18 ENCOUNTER — Other Ambulatory Visit: Payer: Self-pay | Admitting: Obstetrics & Gynecology

## 2015-06-21 NOTE — Telephone Encounter (Signed)
Medication refill request: Estrace 0.1 mg cream Last AEX:  06/15/14 MSM Next AEX: 08/12/15 MSM Last MMG (if hormonal medication request):02/22/15 BIRADS Category 1 negative  Refill authorized: 06/15/14 42.5 g with 4 Refills  Today: 42.5 g 0 Refills? Please advise

## 2015-06-28 ENCOUNTER — Encounter: Payer: Self-pay | Admitting: Physician Assistant

## 2015-06-28 ENCOUNTER — Ambulatory Visit (INDEPENDENT_AMBULATORY_CARE_PROVIDER_SITE_OTHER): Payer: BLUE CROSS/BLUE SHIELD | Admitting: Physician Assistant

## 2015-06-28 VITALS — BP 122/88 | HR 84 | Ht 68.0 in | Wt 209.0 lb

## 2015-06-28 DIAGNOSIS — H1131 Conjunctival hemorrhage, right eye: Secondary | ICD-10-CM | POA: Diagnosis not present

## 2015-06-28 NOTE — Patient Instructions (Signed)
Subconjunctival Hemorrhage °Subconjunctival hemorrhage is bleeding that happens between the white part of your eye (sclera) and the clear membrane that covers the outside of your eye (conjunctiva). There are many tiny blood vessels near the surface of your eye. A subconjunctival hemorrhage happens when one or more of these vessels breaks and bleeds, causing a red patch to appear on your eye. This is similar to a bruise. °Depending on the amount of bleeding, the red patch may only cover a small area of your eye or it may cover the entire visible part of the sclera. If a lot of blood collects under the conjunctiva, there may also be swelling. Subconjunctival hemorrhages do not affect your vision or cause pain, but your eye may feel irritated if there is swelling. Subconjunctival hemorrhages usually do not require treatment, and they disappear on their own within two weeks. °CAUSES °This condition may be caused by: °· Mild trauma, such as rubbing your eye too hard. °· Severe trauma or blunt injuries. °· Coughing, sneezing, or vomiting. °· Straining, such as when lifting a heavy object. °· High blood pressure. °· Recent eye surgery. °· A history of diabetes. °· Certain medicines, especially blood thinners (anticoagulants). °· Other conditions, such as eye tumors, bleeding disorders, or blood vessel abnormalities. °Subconjunctival hemorrhages can happen without an obvious cause.  °SYMPTOMS  °Symptoms of this condition include: °· A bright red or dark red patch on the white part of the eye. °¨ The red area may spread out to cover a larger area of the eye before it goes away. °¨ The red area may turn brownish-yellow before it goes away. °· Swelling. °· Mild eye irritation. °DIAGNOSIS °This condition is diagnosed with a physical exam. If your subconjunctival hemorrhage was caused by trauma, your health care provider may refer you to an eye specialist (ophthalmologist) or another specialist to check for other injuries. You  may have other tests, including: °· An eye exam. °· A blood pressure check. °· Blood tests to check for bleeding disorders. °If your subconjunctival hemorrhage was caused by trauma, X-rays or a CT scan may be done to check for other injuries. °TREATMENT °Usually, no treatment is needed. Your health care provider may recommend eye drops or cold compresses to help with discomfort. °HOME CARE INSTRUCTIONS °· Take over-the-counter and prescription medicines only as directed by your health care provider. °· Use eye drops or cold compresses to help with discomfort as directed by your health care provider. °· Avoid activities, things, and environments that may irritate or injure your eye. °· Keep all follow-up visits as told by your health care provider. This is important. °SEEK MEDICAL CARE IF: °· You have pain in your eye. °· The bleeding does not go away within 3 weeks. °· You keep getting new subconjunctival hemorrhages. °SEEK IMMEDIATE MEDICAL CARE IF: °· Your vision changes or you have difficulty seeing. °· You suddenly develop severe sensitivity to light. °· You develop a severe headache, persistent vomiting, confusion, or abnormal tiredness (lethargy). °· Your eye seems to bulge or protrude from your eye socket. °· You develop unexplained bruises on your body. °· You have unexplained bleeding in another area of your body. °  °This information is not intended to replace advice given to you by your health care provider. Make sure you discuss any questions you have with your health care provider. °  °Document Released: 06/04/2005 Document Revised: 02/23/2015 Document Reviewed: 08/11/2014 °Elsevier Interactive Patient Education ©2016 [employer] ° °

## 2015-06-28 NOTE — Progress Notes (Signed)
   Subjective:    Patient ID: Cynthia [name], female    DOB: [birth year], 55 y.o.   MRN: [id number]  HPI Patient is a 55 year old female who presents to the clinic with redness of right eye. She went to her eye doctor this morning at [facility]. She was diagnosed with subconjunctival hemorhage of unknown cause. Patient denies any heavy lifting, cough, sneeze, eyes trauma. She has not started any new medicines that would cause thinning of blood. She did start her thyroid medication about 2 weeks ago. She denies any pain. She does have an ongoing headache of the right temple. This pain is mild and unchanged. She has a history of the same headache. She denies any acute vision changes. Her blood pressure was 120/94 at her eye doctor. They were concerned with her tumor history of the bottom number. They sent her for evaluation here.    Review of Systems  All other systems reviewed and are negative.      Objective:   Physical Exam  Constitutional: She is oriented to person, place, and time. She appears well-developed and well-nourished.  HENT:  Head: Normocephalic and atraumatic.  Right Ear: External ear normal.  Left Ear: External ear normal.  Eyes: EOM are normal. Pupils are equal, round, and reactive to light.  Right conjunctiva blood is noted medial and laterally.   Neck: Normal range of motion. Neck supple.  Cardiovascular: Normal rate, regular rhythm and normal heart sounds.   Pulmonary/Chest: Effort normal and breath sounds normal.  Lymphadenopathy:    She has no cervical adenopathy.  Neurological: She is alert and oriented to person, place, and time. No cranial nerve deficit. Coordination normal.  Psychiatric: She has a normal mood and affect. Her behavior is normal.          Assessment & Plan:  subconjunctival hemorrhage of right eye- denies any heavy lifting, sneezing, coughing, trauma, new medications. She is on Celebrex but has been on for a while. Stop for the next 2-3 days  as could make bleeding more easy. No neurological findings. BP rechecked and looked great. Call if headache worsening or new symptoms.  I do not think thyroid medication would cause these symptoms. HO given. Follow up as needed. Should resolve on own.

## 2015-07-08 ENCOUNTER — Encounter: Payer: Self-pay | Admitting: Physician Assistant

## 2015-07-19 ENCOUNTER — Other Ambulatory Visit: Payer: BLUE CROSS/BLUE SHIELD

## 2015-07-21 ENCOUNTER — Other Ambulatory Visit: Payer: BLUE CROSS/BLUE SHIELD

## 2015-07-21 ENCOUNTER — Other Ambulatory Visit (INDEPENDENT_AMBULATORY_CARE_PROVIDER_SITE_OTHER): Payer: BLUE CROSS/BLUE SHIELD

## 2015-07-21 DIAGNOSIS — E063 Autoimmune thyroiditis: Secondary | ICD-10-CM | POA: Diagnosis not present

## 2015-07-21 LAB — T4, FREE: Free T4: 1.11 ng/dL (ref 0.60–1.60)

## 2015-07-21 LAB — TSH: TSH: 4.44 u[IU]/mL (ref 0.35–4.50)

## 2015-07-26 ENCOUNTER — Other Ambulatory Visit: Payer: Self-pay | Admitting: *Deleted

## 2015-07-26 MED ORDER — TRIAMTERENE-HCTZ 37.5-25 MG PO TABS: 1.0000 | ORAL_TABLET | Freq: Every day | ORAL | Status: DC

## 2015-08-01 ENCOUNTER — Telehealth: Payer: Self-pay | Admitting: Internal Medicine

## 2015-08-01 MED ORDER — LEVOTHYROXINE SODIUM 25 MCG PO TABS: 50.0000 ug | ORAL_TABLET | Freq: Every day | ORAL | Status: DC

## 2015-08-01 NOTE — Telephone Encounter (Signed)
Patient need a refill of [facility]othyroxine [facility] in [place] [phone]

## 2015-08-01 NOTE — Telephone Encounter (Signed)
Refill sent to pt's pharmacy, [facility] in [place] [employer].

## 2015-08-02 ENCOUNTER — Ambulatory Visit (INDEPENDENT_AMBULATORY_CARE_PROVIDER_SITE_OTHER): Payer: BLUE CROSS/BLUE SHIELD | Admitting: Physician Assistant

## 2015-08-02 ENCOUNTER — Encounter: Payer: Self-pay | Admitting: Physician Assistant

## 2015-08-02 VITALS — BP 126/75 | HR 73 | Ht 68.0 in | Wt 210.0 lb

## 2015-08-02 DIAGNOSIS — D333 Benign neoplasm of cranial nerves: Secondary | ICD-10-CM

## 2015-08-02 DIAGNOSIS — E876 Hypokalemia: Secondary | ICD-10-CM

## 2015-08-02 DIAGNOSIS — F909 Attention-deficit hyperactivity disorder, unspecified type: Secondary | ICD-10-CM

## 2015-08-02 DIAGNOSIS — F988 Other specified behavioral and emotional disorders with onset usually occurring in childhood and adolescence: Secondary | ICD-10-CM

## 2015-08-02 MED ORDER — LISDEXAMFETAMINE DIMESYLATE 60 MG PO CAPS: 60.0000 mg | ORAL_CAPSULE | ORAL | Status: DC

## 2015-08-02 MED ORDER — MECLIZINE HCL 25 MG PO TABS: 25.0000 mg | ORAL_TABLET | Freq: Two times a day (BID) | ORAL | Status: DC

## 2015-08-02 MED ORDER — CYCLOBENZAPRINE HCL 10 MG PO TABS: ORAL_TABLET | ORAL | Status: DC

## 2015-08-02 NOTE — Progress Notes (Signed)
   Subjective:    Patient ID: Cynthia [name], female    DOB: 04/19/1961, 55 y.o.   MRN: [id number]  HPI  Pt is a 55 yo female who presents to the clinic to follow up on ADHD. She is on vyvanse and doing well. No CP, palpitations, insomnia, or anorexia. She is doing well at work.   She would like a refill on her as needed antivert for dizziness from neuroma.   She admits she is not taking potassium due to worsening constipation. Last checked 04/19/15 and was 3.2.    Review of Systems  All other systems reviewed and are negative.      Objective:   Physical Exam  Constitutional: She is oriented to person, place, and time. She appears well-developed and well-nourished.  HENT:  Head: Normocephalic and atraumatic.  Cardiovascular: Normal rate, regular rhythm and normal heart sounds.   Pulmonary/Chest: Effort normal and breath sounds normal.  Neurological: She is alert and oriented to person, place, and time.  Psychiatric: She has a normal mood and affect. Her behavior is normal.          Assessment & Plan:  ADHD- refilled vyvanse for 3 months.   Neuroma- antivert given for dizziness as needed. scheduled to have removed in march 2017.   Hypokalemia- restart potassium have rechecked in 2 weeks. Discussed stool softener with potassium and probtioic. Pt already on linzess.

## 2015-08-12 ENCOUNTER — Encounter: Payer: Self-pay | Admitting: Obstetrics & Gynecology

## 2015-08-12 ENCOUNTER — Ambulatory Visit (INDEPENDENT_AMBULATORY_CARE_PROVIDER_SITE_OTHER): Payer: BLUE CROSS/BLUE SHIELD | Admitting: Obstetrics & Gynecology

## 2015-08-12 VITALS — BP 118/78 | HR 76 | Resp 14 | Ht 67.5 in | Wt 210.0 lb

## 2015-08-12 DIAGNOSIS — Z01419 Encounter for gynecological examination (general) (routine) without abnormal findings: Secondary | ICD-10-CM | POA: Diagnosis not present

## 2015-08-12 DIAGNOSIS — Z124 Encounter for screening for malignant neoplasm of cervix: Secondary | ICD-10-CM

## 2015-08-12 DIAGNOSIS — Z0000 Encounter for general adult medical examination without abnormal findings: Secondary | ICD-10-CM

## 2015-08-12 LAB — POCT URINALYSIS DIPSTICK
Bilirubin, UA: NEGATIVE
Blood, UA: NEGATIVE
Glucose, UA: NEGATIVE
Ketones, UA: NEGATIVE
Leukocytes, UA: NEGATIVE
Nitrite, UA: NEGATIVE
Protein, UA: NEGATIVE
Urobilinogen, UA: NEGATIVE
pH, UA: 5

## 2015-08-12 MED ORDER — ESTRADIOL 0.1 MG/GM VA CREA: TOPICAL_CREAM | VAGINAL | Status: DC

## 2015-08-12 NOTE — Progress Notes (Signed)
55 y.o. G0P0000 MarriedCaucasianF here for annual exam.  Pt reports the year has been "crap".  Had right thyroid lobectomy 04/28/15 at [facility].  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  Having vertigo and feeling "swimmy headed symptoms".  She had an MRI showing a vestibular schwannoma.  Surgery is scheduled march 28th.  She's doing vestibular PT right in preparation for surgery.  Pt may lose hearing in her ear.   Denies vaginal bleeding.    She is exercising regularly, as much as she can.    Now seeing Dr. Cruzita Lederer, endocrinology.  Patient's last menstrual period was 06/18/2006 (approximate).          Sexually active: Yes.    The current method of family planning is vasectomy.    Exercising: Yes.    Kickboxing, strength, bootcamp Smoker:  no  Health Maintenance: Pap:  04/16/13 Neg History of abnormal Pap:  no MMG: 02/23/15 BIRADS1:neg Colonoscopy:  12/15/10 Normal - repeat 10 years  BMD:   Never TDaP:  ~2012 Screening Labs: PCP, Hb today: PCP, Urine today: Negative   reports that she has never smoked. She has never used smokeless tobacco. She reports that she drinks about 0.5 - 1.0 oz of alcohol per week. She reports that she does not use illicit drugs.  Past Medical History  Diagnosis Date  . Diabetes mellitus   . GERD (gastroesophageal reflux disease)   . Iron deficiency anemia, unspecified 02/18/2013  . Constipation, chronic     IBS, CIC  . Obesity     296 lbs (highest weight in 2012)   . Chronic back pain   . Tendonitis, Achilles, left   . ADD (attention deficit disorder)     and OCD-on Vyvanse  . 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 Asthma     w/out status asthmaticus  . Benign brain tumor ([place])   . Awareness under anesthesia   . Vertigo   . Difficult airway     Past Surgical History  Procedure Laterality Date  . Cholecystectomy    . Ovarian cyst removal      x4  . Back surgery    . Achilles tendon repair      x2   . Ablation      and bladder sling  . Iron infusion    . Anal fissure repair  2012  . Toe fusion Left   . Mouth surgery  2015    3 implants  . Thyroid lobectomy Bilateral     Partial     Current Outpatient Prescriptions  Medication Sig Dispense Refill  . albuterol (PROVENTIL HFA;VENTOLIN HFA) 108 (90 Base) MCG/ACT inhaler Inhale into the lungs.    . celecoxib (CELEBREX) 200 MG capsule Take 1 capsule (200 mg total) by mouth 2 (two) times daily. 180 capsule 0  . cyclobenzaprine (FLEXERIL) 10 MG tablet as needed. 30 tablet 2  . dexlansoprazole (DEXILANT) 60 MG capsule Take 60 mg by mouth daily.    [name] Kitchen docusate sodium (COLACE) 50 MG capsule Take 100 mg by mouth at bedtime.    [name] Kitchen ESTRACE VAGINAL 0.1 MG/GM vaginal cream INSERT 1 GRAM VAGINALLY TWICE A WEEK 42.5 g 0  . Fluticasone-Salmeterol (ADVAIR) 100-50 MCG/DOSE AEPB Inhale 1 puff into the lungs 2 (two) times daily.    [name] Kitchen levothyroxine (SYNTHROID, LEVOTHROID) 25 MCG tablet Take 2 tablets (50 mcg total) by mouth daily before breakfast. 60 tablet 2  . LINZESS 290 MCG CAPS  capsule Take 290 mcg by mouth daily.     [name] Kitchen lisdexamfetamine (VYVANSE) 60 MG capsule Take 1 capsule (60 mg total) by mouth every morning. Do not refill until 09/30/15 30 capsule 0  . meclizine (ANTIVERT) 25 MG tablet Take 1 tablet (25 mg total) by mouth 2 (two) times daily. 60 tablet 2  . triamterene-hydrochlorothiazide (MAXZIDE-25) 37.5-25 MG tablet Take 1 tablet by mouth daily. 30 tablet 2   No current facility-administered medications for this visit.    Family History  Problem Relation Age of Onset  . Thyroid disease Mother   . Heart Problems Mother     vavle issue  . Dystonia Mother   . Hypercholesterolemia Mother   . Heart attack Father   . Cancer Father     esophageal  . Heart disease Father   . Lung cancer Paternal Grandfather   . Heart disease Paternal Grandfather   . Heart disease Other     paternal and maternal side  . Heart disease Maternal Grandfather    . Heart disease Paternal Grandmother     ROS:  Pertinent items are noted in HPI.  Otherwise, a comprehensive ROS was negative.  Exam:   BP 118/78 mmHg  Pulse 76  Resp 14  Ht 5' 7.5" (1.715 m)  Wt 210 lb (95.255 kg)  BMI 32.39 kg/m2  LMP 06/18/2006 (Approximate)  Weight change: +10#   Height: 5' 7.5" (171.5 cm)  Ht Readings from Last 3 Encounters:  08/12/15 5' 7.5" (1.715 m)  08/02/15 5\' 8"  (1.727 m)  06/28/15 5\' 8"  (1.727 m)    General appearance: alert, cooperative and appears stated age Head: Normocephalic, without obvious abnormality, atraumatic Neck: no adenopathy, supple, symmetrical, trachea midline and thyroid normal to inspection and palpation Lungs: clear to auscultation bilaterally Breasts: normal appearance, no masses or tenderness Heart: regular rate and rhythm Abdomen: soft, non-tender; bowel sounds normal; no masses,  no organomegaly Extremities: extremities normal, atraumatic, no cyanosis or edema Skin: Skin color, texture, turgor normal. No rashes or lesions Lymph nodes: Cervical, supraclavicular, and axillary nodes normal. No abnormal inguinal nodes palpated Neurologic: Grossly normal   Pelvic: External genitalia:  no lesions              Urethra:  normal appearing urethra with no masses, tenderness or lesions              Bartholins and Skenes: normal                 Vagina: normal appearing vagina with normal color and discharge, no lesions              Cervix: no lesions              Pap taken: Yes.   Bimanual Exam:  Uterus:  normal size, contour, position, consistency, mobility, non-tender              Adnexa: normal adnexa and no mass, fullness, tenderness               Rectovaginal: Confirms               Anus:  normal sphincter tone, no lesions  Chaperone was present for exam.  A: Well Woman with normal exam H/O endometrial ablation, no VB H/O recurrent BV but this has not been an issue for her this last year Atrophic vaginal  changes H/o right thyroid lobectomy H/o schwannoma with planned resection at [facility] in March  P: Mammogram yearly.  Pap smear  with neg HR HPV 2012. Pap 2014. Pap and HR HPV today. Estrace vaginal cream 1 gm pv twice weekly. Rx to pharmacy with RFs.  D/W pt stopping a couple before and after surgery.   Return annually or prn

## 2015-08-16 LAB — IPS PAP TEST WITH HPV

## 2015-09-13 DIAGNOSIS — D333 Benign neoplasm of cranial nerves: Secondary | ICD-10-CM | POA: Diagnosis not present

## 2015-09-13 DIAGNOSIS — Z885 Allergy status to narcotic agent status: Secondary | ICD-10-CM | POA: Diagnosis not present

## 2015-09-13 DIAGNOSIS — H9042 Sensorineural hearing loss, unilateral, left ear, with unrestricted hearing on the contralateral side: Secondary | ICD-10-CM | POA: Diagnosis not present

## 2015-09-13 HISTORY — PX: CRANIOTOMY: SHX93

## 2015-09-27 DIAGNOSIS — R29898 Other symptoms and signs involving the musculoskeletal system: Secondary | ICD-10-CM | POA: Diagnosis not present

## 2015-09-27 DIAGNOSIS — R293 Abnormal posture: Secondary | ICD-10-CM | POA: Diagnosis not present

## 2015-09-27 DIAGNOSIS — D333 Benign neoplasm of cranial nerves: Secondary | ICD-10-CM | POA: Diagnosis not present

## 2015-09-27 DIAGNOSIS — R42 Dizziness and giddiness: Secondary | ICD-10-CM | POA: Diagnosis not present

## 2015-10-04 DIAGNOSIS — R293 Abnormal posture: Secondary | ICD-10-CM | POA: Diagnosis not present

## 2015-10-04 DIAGNOSIS — R42 Dizziness and giddiness: Secondary | ICD-10-CM | POA: Diagnosis not present

## 2015-10-04 DIAGNOSIS — R29898 Other symptoms and signs involving the musculoskeletal system: Secondary | ICD-10-CM | POA: Diagnosis not present

## 2015-10-04 DIAGNOSIS — D333 Benign neoplasm of cranial nerves: Secondary | ICD-10-CM | POA: Diagnosis not present

## 2015-10-07 ENCOUNTER — Other Ambulatory Visit: Payer: Self-pay | Admitting: *Deleted

## 2015-10-07 ENCOUNTER — Ambulatory Visit (INDEPENDENT_AMBULATORY_CARE_PROVIDER_SITE_OTHER): Payer: BLUE CROSS/BLUE SHIELD | Admitting: Internal Medicine

## 2015-10-07 ENCOUNTER — Encounter: Payer: Self-pay | Admitting: Internal Medicine

## 2015-10-07 VITALS — BP 108/64 | HR 74 | Temp 97.8°F | Resp 12 | Wt 209.8 lb

## 2015-10-07 DIAGNOSIS — E890 Postprocedural hypothyroidism: Secondary | ICD-10-CM | POA: Diagnosis not present

## 2015-10-07 DIAGNOSIS — Z9889 Other specified postprocedural states: Secondary | ICD-10-CM

## 2015-10-07 DIAGNOSIS — E063 Autoimmune thyroiditis: Secondary | ICD-10-CM | POA: Diagnosis not present

## 2015-10-07 LAB — T4, FREE: Free T4: 1.1 ng/dL (ref 0.60–1.60)

## 2015-10-07 LAB — TSH: TSH: 1.72 u[IU]/mL (ref 0.35–4.50)

## 2015-10-07 NOTE — Patient Instructions (Signed)
Please continue Levothyroxine 50 mcg daily.  Take the thyroid hormone every day, with water, at least 30 minutes before breakfast, separated by at least 4 hours from: - acid reflux medications - calcium - iron - multivitamins  Please come back for a follow-up appointment in 6 months.  

## 2015-10-07 NOTE — Progress Notes (Signed)
Patient ID: Cynthia [name], female   DOB: 1960-07-25, 55 y.o.   MRN: [id number]   HPI  Cynthia [name] is a 55 y.o.-year-old female, returning for f/u for thyroid cancer and postsurgical hypothyroidism. Last visit 4 mo ago.  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 She cannot hear yet through left ear and her balance is poor, though improving.  She noticed a lump in her throat in 2015 and had hoarseness. Saw PCP >> thyroid U/S >> thyroid nodule >> FNA: Suspicious for follicular neoplasm >> referred to ENT >> R hemithyroidectomy (Dr Simonne Maffucci).  Thyroid neoplasm hx: 02/22/2015:  Thyroid ultrasound:  Right thyroid lobe : 6.4 x 2.5 x 2.3 cm. Heterogeneous appearance. Solid nodule measuring 2.4 x 1.8 x 1.9 centimeters, without internal calcification , with hypoechoic margin and no significant internal blood flow.  Left thyroid lobe, 5.6 x 1.7 x 1.5 cm. Heterogeneous appearance. No focal nodules.  Isthmus : 4 mm in thickness, no nodules visualized.  No lymphadenopathy visualized. 03/29/2015: Right thyroid nodule FNA: Suspicious for a follicular neoplasm (Bethesda category IV) 04/28/2015: R Hemithyroidectomy by Dr Simonne Maffucci ([place]) Path: A. 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 Resection margins are negative. Note: Dr. Vernell Barrier has also reviewed this case and concurs. JAMA Oncol. 2016; 2(8):1023-29. A. "Right thyroid node, stitch at superior pole", in formalin. Received is a 14g, 4.5 x 3.0 x 2.5 cm lobe of thyroid. Inked as follows: Anterior - blue, posterior - black, ischemic margin - yellow. Sectioning reveals a 2.4 x 1.5 x 2.4 cm well-circumscribed nodule with a homogenous firm tan surface. The nodule is in the inferior portion of the lobe and markedly distorts the anterior and posterior capsule. In the superior portion of the lobe there are multiple white areas, which range from 0.2 to 0.7 cm  in greatest dimension. Additionally received is detached brown fragment, 1.2 x 1.0 x 0.3 cm.  Block summary: A1-A2 - Full thickness section of nodule, bisected A3-A4 - Full thickness section of nodule, bisected and including ischemic margin A5-A7 - Representative section of superior portion of thyroid A8 - Detached fragment completely submitted A9-A15 - Remainder of tumor capsule, entirely from superior to inferior  Pt denies feeling nodules in neck, hoarseness, dysphagia/odynophagia, SOB with lying down.  She is on Levothyroxine 50 mcg: - in am - fasting - Dexilant at night - no Fe, Ca, MVI  I reviewed pt's thyroid tests:  Lab Results  Component Value Date   TSH 4.44 07/21/2015   TSH 4.05 06/08/2015   TSH 2.669 02/22/2015   TSH 2.489 10/15/2013   FREET4 1.11 07/21/2015   FREET4 0.87 06/08/2015   FREET4 1.13 02/22/2015    Pt describes: - no fatigue - no weight gain - + cold intolerance - no constipation - no dry skin - no hair loss - no depression  She has + FH of thyroid disorders in: mother. No FH of thyroid cancer.  No h/o radiation tx to head or neck.  She has a history of hyperglycemia >> last HbA1c (checked at last visit per patient's request): Lab Results  Component Value Date   HGBA1C 5.4 06/08/2015   HGBA1C 5.1 10/15/2013   HGBA1C 4.9 07/16/2013   She was on Metformin >> lost 80-90 lbs (heaviest: ~290 lbs) >> sugars improved >> taken off the med >> started to gain weight again.   ROS: Constitutional: see HPI  Eyes: no blurry vision, no xerophthalmia ENT:  no sore throat, no nodules palpated in throat, no dysphagia/odynophagia, + Decreased hearing, + tinnitus Cardiovascular: no CP/SOB/palpitations/leg swelling Respiratory: No cough/SOB Gastrointestinal: no N/V/D/ +C Musculoskeletal:  No muscle/ nojoint aches Skin:  + rash on top of feet Neurological:  No tremors/ nonumbness/tingling/dizziness , + headaches  I reviewed pt's medications, allergies, PMH,  social hx, family hx, and changes were documented in the history of present illness. Otherwise, unchanged from my initial visit note.  Past Medical History  Diagnosis Date  . Diabetes mellitus   . GERD (gastroesophageal reflux disease)   . Iron deficiency anemia, unspecified 02/18/2013  . Constipation, chronic     IBS, CIC  . Obesity     296 lbs (highest weight in 2012)   . Chronic back pain   . Tendonitis, Achilles, left   . ADD (attention deficit disorder)     and OCD-on Vyvanse  . 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 Asthma     w/out status asthmaticus  . Benign brain tumor ([place])   . Awareness under anesthesia   . Vertigo   . Difficult airway    Past Surgical History  Procedure Laterality Date  . Cholecystectomy    . Ovarian cyst removal      x4  . Back surgery    . Achilles tendon repair      x2  . Ablation      and bladder sling  . Iron infusion    . Anal fissure repair  2012  . Toe fusion Left   . Mouth surgery  2015    3 implants  . Thyroid lobectomy Bilateral     Partial    Social History   Social History Main Topics  . Smoking status: Never Smoker   . Smokeless tobacco: Never Used  . Alcohol Use: 0.5 - 1.0 oz/week    1-2 Standard drinks or equivalent per week  . Drug Use: No  . Sexual Activity:    Partners: Male    Birth Control/ Protection: Other-see comments     Comment: vasectomy   Social History Narrative   Marital Status:  Married Information systems manager)    Children:  None    Pets: Dog (01)    Living Situation: Lives with husband     Occupation:  IT - [employer]    Education:  Master's Degree    Tobacco Use/Exposure:  None    Alcohol Use:  Occasional   Drug Use:  None   Diet:  Regular   Exercise:  Kick-boxing and boot camp - 5 times per week    Hobbies: Golf, tennis and working out   Current Outpatient Prescriptions on File Prior to Visit  Medication Sig Dispense Refill  . albuterol (PROVENTIL HFA;VENTOLIN HFA) 108  (90 Base) MCG/ACT inhaler Inhale into the lungs.    . celecoxib (CELEBREX) 200 MG capsule Take 1 capsule (200 mg total) by mouth 2 (two) times daily. 180 capsule 0  . cyclobenzaprine (FLEXERIL) 10 MG tablet as needed. 30 tablet 2  . dexlansoprazole (DEXILANT) 60 MG capsule Take 60 mg by mouth daily.    [name] Kitchen docusate sodium (COLACE) 50 MG capsule Take 100 mg by mouth at bedtime.    [name] Kitchen estradiol (ESTRACE VAGINAL) 0.1 MG/GM vaginal cream INSERT 1 GRAM VAGINALLY TWICE A WEEK 42.5 g 2  . Fluticasone-Salmeterol (ADVAIR) 100-50 MCG/DOSE AEPB Inhale 1 puff into the lungs 2 (two) times daily.    [name] Kitchen levothyroxine (SYNTHROID, LEVOTHROID)  25 MCG tablet Take 2 tablets (50 mcg total) by mouth daily before breakfast. 60 tablet 2  . LINZESS 290 MCG CAPS capsule Take 290 mcg by mouth daily.     [name] Kitchen lisdexamfetamine (VYVANSE) 60 MG capsule Take 1 capsule (60 mg total) by mouth every morning. Do not refill until 09/30/15 30 capsule 0  . meclizine (ANTIVERT) 25 MG tablet Take 1 tablet (25 mg total) by mouth 2 (two) times daily. (Patient taking differently: Take 25 mg by mouth as needed. ) 60 tablet 2  . triamterene-hydrochlorothiazide (MAXZIDE-25) 37.5-25 MG tablet Take 1 tablet by mouth daily. 30 tablet 2   No current facility-administered medications on file prior to visit.   Allergies  Allergen Reactions  . Copper Swelling  . Mobic [Meloxicam] Nausea And Vomiting  . Multivitamins Other (See Comments)   Family History  Problem Relation Age of Onset  . Thyroid disease Mother   . Heart Problems Mother     vavle issue  . Dystonia Mother   . Hypercholesterolemia Mother   . Heart attack Father   . Cancer Father     esophageal  . Heart disease Father   . Lung cancer Paternal Grandfather   . Heart disease Paternal Grandfather   . Heart disease Other     paternal and maternal side  . Heart disease Maternal Grandfather   . Heart disease Paternal Grandmother    PE: BP 108/64 mmHg  Pulse 74  Temp(Src) 97.8 F  (36.6 C) (Oral)  Resp 12  Wt 209 lb 12.8 oz (95.165 kg)  SpO2 97% Wt Readings from Last 3 Encounters:  10/07/15 209 lb 12.8 oz (95.165 kg)  08/12/15 210 lb (95.255 kg)  08/02/15 210 lb (95.255 kg)   Constitutional: overweight, in NAD, Walks with ski stick Eyes: PERRLA, EOMI, no exophthalmos ENT: moist mucous membranes,  Thyroidectomy scar healing , no keloid , no masses palpated in neck, no cervical lymphadenopathy Cardiovascular: RRR, No MRG Respiratory: CTA B Gastrointestinal: abdomen soft, NT, ND, BS+ Musculoskeletal: no deformities, strength intact in all 4 Skin: moist, warm, no rashes Neurological: no tremor with outstretched hands, DTR normal in all 4  ASSESSMENT: 1.  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  Follicular thyroid neoplasm - Status post right hemithyroidectomy - I reviewed the results of the final pathology along with the patient >>  Her tumor is the lowest risk across the thyroid cancer classification. Based on recent evidence, treatment for these tumors can be de-escalated from completion thyroidectomy and radioactive iodine treatment to just monitoring after hemithyroidectomy. The chance of recurrence is less than 1% in the next 15 years.   - We will continue to follow her by thyroid function tests and thyroid ultrasounds   2.  Postsurgical hypothyroidism - Mild, but we started levothyroxine 50 g daily. She feels good on this dose. - We discussed about correct intake of levothyroxine if we need to start it: fasting, with water, separated by at least 30 minutes from breakfast, and separated by more than 4 hours from calcium, iron, multivitamins, acid reflux medications (PPIs). She is taking it correctly. - We will check today: TSH, free T4 - If  Labs are abnormal, she will need to return in  5-6 weeks for repeat labs  - If these are normal, we will  recheck them at next visit Return in about 6 months (around 04/07/2016).    Office Visit  on 10/07/2015  Component Date Value Ref Range Status  . TSH 10/07/2015 1.72  0.35 - 4.50 uIU/mL Final  . Free T4 10/07/2015 1.10  0.60 - 1.60 ng/dL Final   TFTs normal. Continue levothyroxine 50 g daily.

## 2015-10-11 ENCOUNTER — Other Ambulatory Visit: Payer: Self-pay | Admitting: Physician Assistant

## 2015-10-11 DIAGNOSIS — R29898 Other symptoms and signs involving the musculoskeletal system: Secondary | ICD-10-CM | POA: Diagnosis not present

## 2015-10-11 DIAGNOSIS — R293 Abnormal posture: Secondary | ICD-10-CM | POA: Diagnosis not present

## 2015-10-11 DIAGNOSIS — D333 Benign neoplasm of cranial nerves: Secondary | ICD-10-CM | POA: Diagnosis not present

## 2015-10-11 DIAGNOSIS — R42 Dizziness and giddiness: Secondary | ICD-10-CM | POA: Diagnosis not present

## 2015-10-14 ENCOUNTER — Encounter: Payer: Self-pay | Admitting: Physician Assistant

## 2015-10-14 ENCOUNTER — Ambulatory Visit (INDEPENDENT_AMBULATORY_CARE_PROVIDER_SITE_OTHER): Payer: BLUE CROSS/BLUE SHIELD | Admitting: Physician Assistant

## 2015-10-14 VITALS — BP 104/51 | HR 58 | Temp 98.5°F | Ht 67.5 in | Wt 209.0 lb

## 2015-10-14 DIAGNOSIS — F909 Attention-deficit hyperactivity disorder, unspecified type: Secondary | ICD-10-CM | POA: Diagnosis not present

## 2015-10-14 DIAGNOSIS — H9192 Unspecified hearing loss, left ear: Secondary | ICD-10-CM | POA: Diagnosis not present

## 2015-10-14 DIAGNOSIS — D333 Benign neoplasm of cranial nerves: Secondary | ICD-10-CM | POA: Diagnosis not present

## 2015-10-14 DIAGNOSIS — F988 Other specified behavioral and emotional disorders with onset usually occurring in childhood and adolescence: Secondary | ICD-10-CM

## 2015-10-14 MED ORDER — LISDEXAMFETAMINE DIMESYLATE 60 MG PO CAPS: 60.0000 mg | ORAL_CAPSULE | ORAL | Status: DC

## 2015-10-14 MED ORDER — TRIAMCINOLONE ACETONIDE 0.1 % EX CREA: 1.0000 "application " | TOPICAL_CREAM | Freq: Two times a day (BID) | CUTANEOUS | Status: DC

## 2015-10-14 NOTE — Progress Notes (Addendum)
   Subjective:    Patient ID: Cynthia [name], female    DOB: 1961-06-17, 55 y.o.   MRN: [id number]  HPI Patient is here today to discuss her Vyvanse prescription. She is currently taking 60 MG daily. She has been taking the medication for approximately 2 years after ungoing testing. She was having hyperactivity and difficulty getting things done at that time. She is now interested in coming off of the medication. She states she did not take her medication for a 2 week period and could not tell a difference, but she has also not been working or doing her normal daily activities since she is recovering form surgery.   She is also complaining of skin discoloration on the tops of her feet and toes x several months. The areas are not itchy or raised. She has been using lotion and Hydrocortisone with no improvement. She had surgery 4 weeks ago and has not been wearing shoes that would irritate her skin. She has not changed detergents, soaps, socks, shoes or sheets. The rash is nowhere else on her body.    Review of Systems  All other systems reviewed and are negative.      Objective:   Physical Exam  Constitutional: She appears well-developed and well-nourished.  HENT:  Head: Normocephalic and atraumatic.  Eyes: Right eye exhibits no discharge. Left eye exhibits no discharge.  Neck: No thyromegaly present.  Cardiovascular: Normal rate, regular rhythm and normal heart sounds.  Exam reveals no gallop and no friction rub.   No murmur heard. Pulmonary/Chest: Effort normal and breath sounds normal. No respiratory distress. She has no wheezes. She has no rales. She exhibits no tenderness.  Skin: Skin is warm and dry.         Assessment & Plan:  1. Attention Deficit Disorder- Patient has a history of ADD and has been taking 60 MG of Vyvanse daily. After discussing benefits of Vyvanse, patient has decided to continue daily medication. Will refill Vyvanse 60 MG.   2. Rash on dorsal aspect of foot,  bilateral- Patient presents with a flesh to red colored, flat, lacy rash on dorsal aspect of bilateral feet. Will send Triamcinolone cream 0.1% to treat for eczema like rash. Unclear etiology. Does not appear to be infection.   3. acostic neuroma/left hearing loss- pt had brain surgery to remove. She is doing well. She still has not regained hearing in left ear. She is scheduled to follow up with [facility] in 1 week.

## 2015-10-27 DIAGNOSIS — D333 Benign neoplasm of cranial nerves: Secondary | ICD-10-CM | POA: Diagnosis not present

## 2015-10-27 DIAGNOSIS — R42 Dizziness and giddiness: Secondary | ICD-10-CM | POA: Diagnosis not present

## 2015-10-27 DIAGNOSIS — R293 Abnormal posture: Secondary | ICD-10-CM | POA: Diagnosis not present

## 2015-10-31 ENCOUNTER — Ambulatory Visit: Payer: BLUE CROSS/BLUE SHIELD | Admitting: Physician Assistant

## 2015-11-05 ENCOUNTER — Other Ambulatory Visit: Payer: Self-pay | Admitting: Internal Medicine

## 2015-11-10 DIAGNOSIS — R42 Dizziness and giddiness: Secondary | ICD-10-CM | POA: Diagnosis not present

## 2015-11-10 DIAGNOSIS — D333 Benign neoplasm of cranial nerves: Secondary | ICD-10-CM | POA: Diagnosis not present

## 2015-11-10 DIAGNOSIS — R293 Abnormal posture: Secondary | ICD-10-CM | POA: Diagnosis not present

## 2015-11-24 DIAGNOSIS — R42 Dizziness and giddiness: Secondary | ICD-10-CM | POA: Diagnosis not present

## 2015-11-24 DIAGNOSIS — R293 Abnormal posture: Secondary | ICD-10-CM | POA: Diagnosis not present

## 2015-11-24 DIAGNOSIS — D333 Benign neoplasm of cranial nerves: Secondary | ICD-10-CM | POA: Diagnosis not present

## 2015-12-01 ENCOUNTER — Other Ambulatory Visit: Payer: Self-pay | Admitting: Obstetrics & Gynecology

## 2015-12-05 DIAGNOSIS — D333 Benign neoplasm of cranial nerves: Secondary | ICD-10-CM | POA: Diagnosis not present

## 2015-12-05 DIAGNOSIS — H9192 Unspecified hearing loss, left ear: Secondary | ICD-10-CM | POA: Diagnosis not present

## 2015-12-05 DIAGNOSIS — H9042 Sensorineural hearing loss, unilateral, left ear, with unrestricted hearing on the contralateral side: Secondary | ICD-10-CM | POA: Diagnosis not present

## 2015-12-06 ENCOUNTER — Encounter: Payer: Self-pay | Admitting: Physician Assistant

## 2015-12-06 DIAGNOSIS — D333 Benign neoplasm of cranial nerves: Secondary | ICD-10-CM | POA: Insufficient documentation

## 2015-12-06 DIAGNOSIS — H9192 Unspecified hearing loss, left ear: Secondary | ICD-10-CM | POA: Insufficient documentation

## 2015-12-22 DIAGNOSIS — H9042 Sensorineural hearing loss, unilateral, left ear, with unrestricted hearing on the contralateral side: Secondary | ICD-10-CM | POA: Diagnosis not present

## 2016-01-05 DIAGNOSIS — K219 Gastro-esophageal reflux disease without esophagitis: Secondary | ICD-10-CM | POA: Diagnosis not present

## 2016-01-05 DIAGNOSIS — K581 Irritable bowel syndrome with constipation: Secondary | ICD-10-CM | POA: Diagnosis not present

## 2016-01-05 DIAGNOSIS — E669 Obesity, unspecified: Secondary | ICD-10-CM | POA: Diagnosis not present

## 2016-01-05 LAB — HEPATIC FUNCTION PANEL
ALT: 16 U/L (ref 7–35)
AST: 17 U/L (ref 13–35)
Alkaline Phosphatase: 62 U/L (ref 25–125)
Bilirubin, Total: 1.2 mg/dL

## 2016-01-05 LAB — CBC AND DIFFERENTIAL
HCT: 41 % (ref 36–46)
Hemoglobin: 14.1 g/dL (ref 12.0–16.0)
Platelets: 152 10*3/uL (ref 150–399)
WBC: 6.4 10^3/mL

## 2016-01-13 ENCOUNTER — Ambulatory Visit: Payer: BLUE CROSS/BLUE SHIELD | Admitting: Physician Assistant

## 2016-01-16 ENCOUNTER — Ambulatory Visit (INDEPENDENT_AMBULATORY_CARE_PROVIDER_SITE_OTHER): Payer: BLUE CROSS/BLUE SHIELD | Admitting: Physician Assistant

## 2016-01-16 ENCOUNTER — Encounter: Payer: Self-pay | Admitting: Physician Assistant

## 2016-01-16 VITALS — BP 113/69 | HR 57 | Ht 67.5 in | Wt 214.0 lb

## 2016-01-16 DIAGNOSIS — F909 Attention-deficit hyperactivity disorder, unspecified type: Secondary | ICD-10-CM

## 2016-01-16 DIAGNOSIS — E669 Obesity, unspecified: Secondary | ICD-10-CM | POA: Diagnosis not present

## 2016-01-16 DIAGNOSIS — Z13220 Encounter for screening for lipoid disorders: Secondary | ICD-10-CM | POA: Diagnosis not present

## 2016-01-16 DIAGNOSIS — F988 Other specified behavioral and emotional disorders with onset usually occurring in childhood and adolescence: Secondary | ICD-10-CM

## 2016-01-16 MED ORDER — LISDEXAMFETAMINE DIMESYLATE 60 MG PO CAPS: 60.0000 mg | ORAL_CAPSULE | ORAL | 0 refills | Status: DC

## 2016-01-16 MED ORDER — TRIAMTERENE-HCTZ 37.5-25 MG PO TABS: 1.0000 | ORAL_TABLET | Freq: Every day | ORAL | 2 refills | Status: DC

## 2016-01-16 MED ORDER — NALTREXONE-BUPROPION HCL ER 8-90 MG PO TB12: ORAL_TABLET | ORAL | 0 refills | Status: DC

## 2016-01-16 NOTE — Patient Instructions (Addendum)
Bupropion; Naltrexone extended-release tablets What is this medicine? BUPROPION; NALTREXONE (byoo PROE pee on; nal TREX one) is a combination product used to promote and maintain weight loss in obese adults or overweight adults who also have weight related medical problems. This medicine should be used with a reduced calorie diet and increased physical activity. This medicine may be used for other purposes; ask your health care provider or pharmacist if you have questions. What should I tell my health care provider before I take this medicine? They need to know if you have any of these conditions: -an eating disorder, such as anorexia or bulimia -diabetes -glaucoma -head injury -heart disease -high blood pressure -history of a drug or alcohol abuse problem -history of a tumor or infection of your brain or spine -history of stroke -history of irregular heartbeat -kidney disease -liver disease -mental illness such as bipolar disorder or psychosis -seizures -suicidal thoughts, plans, or attempt; a previous suicide attempt by you or a family member -an unusual or allergic reaction to bupropion, naltrexone, other medicines, foods, dyes, or preservatives breast-feeding -pregnant or trying to become pregnant How should I use this medicine? Take this medicine by mouth with a glass of water. Follow the directions on the prescription label. Take this medicine in the morning and in the evenings as directed by your healthcare professional. Dennis Bast can take it with or without food. Do not take with high-fat meals as this may increase your risk of seizures. Do not crush, chew, or cut these tablets. Do not take your medicine more often than directed. Do not stop taking this medicine suddenly except upon the advice of your doctor. A special MedGuide will be given to you by the pharmacist with each prescription and refill. Be sure to read this information carefully each time. Talk to your pediatrician  regarding the use of this medicine in children. Special care may be needed. Overdosage: If you think you have taken too much of this medicine contact a poison control center or emergency room at once. NOTE: This medicine is only for you. Do not share this medicine with others. What if I miss a dose? If you miss a dose, skip the missed dose and take your next tablet at the regular time. Do not take double or extra doses. What may interact with this medicine? Do not take this medicine with any of the following medications: -any prescription or street opioid drug like codiene, heroin, methadone -linezolid -MAOIs like Carbex, Eldepryl, Marplan, Nardil, and Parnate -methylene blue (injected into a vein) -other medicines that contain bupropion like Zyban or Wellbutrin This medicine may also interact with the following medications: -alcohol -certain medicines for anxiety or sleep -certain medicines for blood pressure like metoprolol, propranolol -certain medicines for depression or psychotic disturbances -certain medicines for HIV or AIDS like efavirenz, lopinavir, nelfinavir, ritonavir -certain medicines for irregular heart beat like propafenone, flecainide -certain medicines for Parkinson's disease like amantadine, levodopa -certain medicines for seizures like carbamazepine, phenytoin, phenobarbital -cimetidine -clopidogrel -cyclophosphamide -disulfiram -furazolidone -isoniazid -nicotine -orphenadrine -procarbazine -steroid medicines like prednisone or cortisone -stimulant medicines for attention disorders, weight loss, or to stay awake -tamoxifen -theophylline -thioridazine -thiotepa -ticlopidine -tramadol -warfarin This list may not describe all possible interactions. Give your health care provider a list of all the medicines, herbs, non-prescription drugs, or dietary supplements you use. Also tell them if you smoke, drink alcohol, or use illegal drugs. Some items may interact  with your medicine. What should I watch for while using this medicine? This  medicine is intended to be used in addition to a healthy diet and appropriate exercise. The best results are achieved this way. Do not increase or in any way change your dose without consulting your doctor or health care professional. Do not take this medicine with other prescription or over-the-counter weight loss products without consulting your doctor or health care professional. Your doctor should tell you to stop taking this medicine if you do not lose a certain amount of weight within the first 12 weeks of treatment. Visit your doctor or health care professional for regular checkups. Your doctor may order blood tests or other tests to see how you are doing. This medicine may affect blood sugar levels. If you have diabetes, check with your doctor or health care professional before you change your diet or the dose of your diabetic medicine. Patients and their families should watch out for new or worsening depression or thoughts of suicide. Also watch out for sudden changes in feelings such as feeling anxious, agitated, panicky, irritable, hostile, aggressive, impulsive, severely restless, overly excited and hyperactive, or not being able to sleep. If this happens, especially at the beginning of treatment or after a change in dose, call your health care professional. Avoid alcoholic drinks while taking this medicine. Drinking large amounts of alcoholic beverages, using sleeping or anxiety medicines, or quickly stopping the use of these agents while taking this medicine may increase your risk for a seizure. What side effects may I notice from receiving this medicine? Side effects that you should report to your doctor or health care professional as soon as possible: -allergic reactions like skin rash, itching or hives, swelling of the face, lips, or tongue -breathing problems -changes in vision, hearing -chest  pain -confusion -dark urine -depressed mood -fast or irregular heart beat -fever -hallucination, loss of contact with reality -increased blood pressure -light-colored stools -redness, blistering, peeling or loosening of the skin, including inside the mouth -right upper belly pain -seizures -suicidal thoughts or other mood changes -unusually weak or tired -vomiting -yellowing of the eyes or skin Side effects that usually do not require medical attention (Report these to your doctor or health care professional if they continue or are bothersome.): -constipation -diarrhea -dizziness -dry mouth -headache -nausea -trouble sleeping This list may not describe all possible side effects. Call your doctor for medical advice about side effects. You may report side effects to FDA at 1-800-FDA-1088. Where should I keep my medicine? Keep out of the reach of children. Store at room temperature between 15 and 30 degrees C (59 and 86 degrees F). Throw away any unused medicine after the expiration date. NOTE: This sheet is a summary. It may not cover all possible information. If you have questions about this medicine, talk to your doctor, pharmacist, or health care provider.    2016, Elsevier/Gold Standard. (2013-03-11 15:17:29)

## 2016-01-16 NOTE — Progress Notes (Signed)
   Subjective:    Patient ID: Cynthia [name], female    DOB: 1960-08-03, 55 y.o.   MRN: [id number]  HPI Patient is a 55 year old female who presents to the clinic for 3 month ADD recheck. She is on Vyvanse and doing well. She has no concerns or complaints. She denies any insomnia, anorexia, palpitations or chest pains.  She would like to discuss weight. She feels like she is watching her calories and exercising but not losing any weight.   Review of Systems See history of present illness    Objective:   Physical Exam  Constitutional: She is oriented to person, place, and time. She appears well-developed and well-nourished.  Cardiovascular: Normal rate, regular rhythm and normal heart sounds.   Pulmonary/Chest: Effort normal and breath sounds normal.  Neurological: She is alert and oriented to person, place, and time.  Psychiatric: She has a normal mood and affect. Her behavior is normal.          Assessment & Plan:  ADD- refilled vyvanse for 3 months.   Obesity- discussed options and decided on, trace. Discussed side effects of medication. Encouraged patient to go on line and give coupon. Started pack given. She is able to tolerate this we can give her a refill and follow-up in 3 months. Encouraged diet and exercise to use in combination.  Patient has not had her cholesterol checked by GYN, gastroenterologist or endocrinologist. Ordered this today. She does bring in labs from her gastroenterologist of a [facility]. We'll add into the EMR.

## 2016-01-22 ENCOUNTER — Other Ambulatory Visit: Payer: Self-pay | Admitting: Physician Assistant

## 2016-01-31 DIAGNOSIS — H9042 Sensorineural hearing loss, unilateral, left ear, with unrestricted hearing on the contralateral side: Secondary | ICD-10-CM | POA: Diagnosis not present

## 2016-01-31 DIAGNOSIS — J45909 Unspecified asthma, uncomplicated: Secondary | ICD-10-CM | POA: Diagnosis not present

## 2016-01-31 DIAGNOSIS — H9192 Unspecified hearing loss, left ear: Secondary | ICD-10-CM | POA: Diagnosis not present

## 2016-01-31 DIAGNOSIS — E039 Hypothyroidism, unspecified: Secondary | ICD-10-CM | POA: Diagnosis not present

## 2016-01-31 DIAGNOSIS — Z86018 Personal history of other benign neoplasm: Secondary | ICD-10-CM | POA: Diagnosis not present

## 2016-02-07 ENCOUNTER — Encounter: Payer: Self-pay | Admitting: Physician Assistant

## 2016-02-11 ENCOUNTER — Other Ambulatory Visit: Payer: Self-pay | Admitting: Internal Medicine

## 2016-02-29 DIAGNOSIS — Z13220 Encounter for screening for lipoid disorders: Secondary | ICD-10-CM | POA: Diagnosis not present

## 2016-03-01 LAB — LIPID PANEL
Cholesterol: 188 mg/dL (ref 125–200)
HDL: 68 mg/dL (ref >=46)
LDL Cholesterol: 105 mg/dL (ref <130)
Total CHOL/HDL Ratio: 2.8 Ratio (ref <=5.0)
Triglycerides: 76 mg/dL (ref <150)
VLDL: 15 mg/dL (ref <30)

## 2016-03-06 ENCOUNTER — Other Ambulatory Visit: Payer: Self-pay | Admitting: Physician Assistant

## 2016-03-19 ENCOUNTER — Other Ambulatory Visit: Payer: Self-pay | Admitting: *Deleted

## 2016-03-19 MED ORDER — CELECOXIB 200 MG PO CAPS: 200.0000 mg | ORAL_CAPSULE | Freq: Two times a day (BID) | ORAL | 0 refills | Status: DC

## 2016-03-20 ENCOUNTER — Telehealth: Payer: Self-pay | Admitting: *Deleted

## 2016-03-20 NOTE — Telephone Encounter (Signed)
Prior auth approved through [employer] - [place] Case ID: [id number] Your request has been approved  CaseId:[id number];Product Name:ST: Celebrex (celecoxib) - GF - ESI;Status:Approved;Coverage Start Date:02/19/2016;Coverage End Date:03/20/2017   Patient notified and notified to schedule f/u appt for back pain since she hadn't been seen for that specific issue since 2015

## 2016-03-21 ENCOUNTER — Encounter: Payer: Self-pay | Admitting: Osteopathic Medicine

## 2016-03-21 ENCOUNTER — Ambulatory Visit (INDEPENDENT_AMBULATORY_CARE_PROVIDER_SITE_OTHER): Payer: BLUE CROSS/BLUE SHIELD | Admitting: Osteopathic Medicine

## 2016-03-21 VITALS — BP 134/66 | HR 73 | Ht 68.0 in | Wt 210.0 lb

## 2016-03-21 DIAGNOSIS — R0789 Other chest pain: Secondary | ICD-10-CM

## 2016-03-21 DIAGNOSIS — M940 Chondrocostal junction syndrome [Tietze]: Secondary | ICD-10-CM | POA: Insufficient documentation

## 2016-03-21 DIAGNOSIS — J4530 Mild persistent asthma, uncomplicated: Secondary | ICD-10-CM | POA: Diagnosis not present

## 2016-03-21 NOTE — Progress Notes (Signed)
HPI: Cynthia [name] is a 55 y.o. female  who presents to [facility] today, 03/21/16,  for chief complaint of:  Chief Complaint  Patient presents with  . Chest Pain     . Context: Recently undergone surgery, has started working out again, takes may have something to do with that but she has been nervous given all of her health issues over the course of the past year and would like to get checked out . Location: Bilateral lower sternum . Quality: Sore, occasionally sharp, . Duration: 6 days . Timing: Intermittent, worse with deep breath, some tenderness to touch . Modifying factors: Patient is on Celebrex but only taking this once per day. . Assoc signs/symptoms: No shortness of breath or chest pain on neck section, no headache/chest pain/dizziness with working out    Past medical, surgical, social and family history reviewed: Past Medical History:  Diagnosis Date  . ADD (attention deficit disorder)    and OCD-on Vyvanse  . Asthma    w/out status asthmaticus  . Awareness under anesthesia   . Benign brain tumor ([place])   . Chronic back pain   . Constipation, chronic    IBS, [facility]  . Diabetes mellitus   . Difficult airway   . 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 GERD (gastroesophageal reflux disease)   . Iron deficiency anemia, unspecified 02/18/2013  . Obesity    296 lbs (highest weight in 2012)   . Tendonitis, Achilles, left   . Vertigo    Past Surgical History:  Procedure Laterality Date  . ABLATION     and bladder sling  . ACHILLES TENDON REPAIR     x2  . ANAL FISSURE REPAIR  2012  . BACK SURGERY    . CHOLECYSTECTOMY    . iron infusion    . MOUTH SURGERY  2015   3 implants  . OVARIAN CYST REMOVAL     x4  . THYROID LOBECTOMY Bilateral    Partial   . TOE FUSION Left    Social History  Substance Use Topics  . Smoking status: Never Smoker  . Smokeless tobacco: Never Used  . Alcohol use 0.5 - 1.0 oz/week    1 - 2  Standard drinks or equivalent per week   Family History  Problem Relation Age of Onset  . Thyroid disease Mother   . Heart Problems Mother     vavle issue  . Dystonia Mother   . Hypercholesterolemia Mother   . Heart attack Father   . Cancer Father     esophageal  . Heart disease Father   . Lung cancer Paternal Grandfather   . Heart disease Paternal Grandfather   . Heart disease Other     paternal and maternal side  . Heart disease Maternal Grandfather   . Heart disease Paternal Grandmother      Current medication list and allergy/intolerance information reviewed:   Current Outpatient Prescriptions  Medication Sig Dispense Refill  . celecoxib (CELEBREX) 200 MG capsule Take 1 capsule (200 mg total) by mouth 2 (two) times daily. Needs appt 60 capsule 0  . cyclobenzaprine (FLEXERIL) 10 MG tablet as needed. 30 tablet 2  . dexlansoprazole (DEXILANT) 60 MG capsule Take 60 mg by mouth daily.    [name] Kitchen docusate sodium (COLACE) 50 MG capsule Take 100 mg by mouth at bedtime.    [name] Kitchen estradiol (ESTRACE VAGINAL) 0.1 MG/GM vaginal cream INSERT 1 GRAM VAGINALLY TWICE A  WEEK 42.5 g 2  . Fluticasone-Salmeterol (ADVAIR) 100-50 MCG/DOSE AEPB Inhale 1 puff into the lungs 2 (two) times daily.    [name] Kitchen levothyroxine (SYNTHROID, LEVOTHROID) 25 MCG tablet TAKE 2 TABLETS (50 MCG TOTAL) BY MOUTH DAILY BEFORE BREAKFAST. 60 tablet 2  . LINZESS 290 MCG CAPS capsule Take 290 mcg by mouth daily.     [name] Kitchen lisdexamfetamine (VYVANSE) 60 MG capsule Take 1 capsule (60 mg total) by mouth every morning. 30 capsule 0  . Naltrexone-Bupropion HCl ER (CONTRAVE) 8-90 MG TB12 Take 2 tablets by mouth 2 (two) times daily. 120 tablet 1  . triamcinolone cream (KENALOG) 0.1 % Apply 1 application topically 2 (two) times daily. 30 g 0  . triamterene-hydrochlorothiazide (MAXZIDE-25) 37.5-25 MG tablet Take 1 tablet by mouth daily. 30 tablet 2   No current facility-administered medications for this visit.    Allergies  Allergen Reactions  .  Mobic [Meloxicam] Nausea And Vomiting  . Multivitamins Other (See Comments)      Review of Systems:  Constitutional:  No  fever, no chills, No recent illness,  HEENT: No  headache, no vision change  Cardiac: +chest pain, No  pressure, No palpitations, No  Orthopnea  Respiratory:  No  shortness of breath. No  Cough  Gastrointestinal: No  abdominal pain, No  nausea, No  Vomiting, +hx GERD  Musculoskeletal: No new myalgia/arthralgia  Skin: No  Rash  Neurologic: No  weakness, No  dizziness  Exam:  BP 134/66   Pulse 73   Ht 5\' 8"  (1.727 m)   Wt 210 lb (95.3 kg)   BMI 31.93 kg/m   Constitutional: VS see above. General Appearance: alert, well-developed, well-nourished, NAD  Eyes: Normal lids and conjunctive, non-icteric sclera  Ears, Nose, Mouth, Throat: MMM,   Neck: No masses, trachea midline. No thyroid enlargement. No tenderness/mass appreciated. No lymphadenopathy, normal ROM  Respiratory: Normal respiratory effort. no wheeze, no rhonchi, no rales   Cardiovascular: S1/S2 normal, no murmur, no rub/gallop auscultated. RRR. No lower extremity edema. +tendernss to costochondral junction of bit worse on the left side of lower sternum, some present on the right, reproduction of chest pain symptoms with resisted arm forward flexion  Gastrointestinal: Nontender, no masses.   Musculoskeletal: Gait normal. No clubbing/cyanosis of digits.   Neurological: Motor and sensation intact and symmetric. Cerebellar reflexes intact. Normal balance/coordination. No tremor.   Skin: warm, dry, intact. No rash/ulcer  Psychiatric: Normal judgment/insight. Normal mood and affect. Oriented x3.    EKG interpretation: Rate: 73 Rhythm: sinus No ST/T changes concerning for acute ischemia/infarct  Comparison with previous EKG scanned from [facility], no change.   ASSESSMENT/PLAN:  Reassured patient most likely musculoskeletal chest pain rather than cardiac/pulmonary cause given normal exam and  non-concerning EKG, no shortness of breath/cough, no chest pain on exertion or other angina symptoms, reproduction with musculoskeletal strain, no significant shortness of breath or tachycardia. Patient is rightfully anxious given her other health problems that she has had including recent surgeries for acoustic schwannoma and thyroid problems. Advised increase dose NSAID, Celebrex twice a day rather than daily. Basic stretching exercises, can add heat if desired. If anything changes or gets worse, let us know.  Other chest pain - Plan: EKG 12-Lead  Costochondritis  Mild persistent asthma, unspecified whether complicated   Patient Instructions  If anything gets worse or changes, please let us know! Celebrex twice per day for the time being     Costochondritis Costochondritis, sometimes called Tietze syndrome, is a swelling and irritation (inflammation)  of the tissue (cartilage) that connects your ribs with your breastbone (sternum). It causes pain in the chest and rib area. Costochondritis usually goes away on its own over time. It can take up to 6 weeks or longer to get better, especially if you are unable to limit your activities. CAUSES  Some cases of costochondritis have no known cause. Possible causes include:  Injury (trauma).  Exercise or activity such as lifting.  Severe coughing. SIGNS AND SYMPTOMS  Pain and tenderness in the chest and rib area.  Pain that gets worse when coughing or taking deep breaths.  Pain that gets worse with specific movements. DIAGNOSIS  Your health care provider will do a physical exam and ask about your symptoms. Chest X-rays or other tests may be done to rule out other problems. TREATMENT  Costochondritis usually goes away on its own over time. Your health care provider may prescribe medicine to help relieve pain. HOME CARE INSTRUCTIONS   Avoid exhausting physical activity. Try not to strain your ribs during normal activity. This would include  any activities using chest, abdominal, and side muscles, especially if heavy weights are used.  Apply ice to the affected area for the first 2 days after the pain begins.  Put ice in a plastic bag.  Place a towel between your skin and the bag.  Leave the ice on for 20 minutes, 2-3 times a day.  Only take over-the-counter or prescription medicines as directed by your health care provider. SEEK MEDICAL CARE IF:  You have redness or swelling at the rib joints. These are signs of infection.  Your pain does not go away despite rest or medicine. SEEK IMMEDIATE MEDICAL CARE IF:   Your pain increases or you are very uncomfortable.  You have shortness of breath or difficulty breathing.  You cough up blood.  You have worse chest pains, sweating, or vomiting.  You have a fever or persistent symptoms for more than 2-3 days.  You have a fever and your symptoms suddenly get worse. MAKE SURE YOU:   Understand these instructions.  Will watch your condition.  Will get help right away if you are not doing well or get worse.   This information is not intended to replace advice given to you by your health care provider. Make sure you discuss any questions you have with your health care provider.   Document Released: 03/14/2005 Document Revised: 03/25/2013 Document Reviewed: 01/06/2013 Elsevier Interactive Patient Education 2016 [employer].    Visit summary with medication list and pertinent instructions was printed for patient to review. All questions at time of visit were answered - patient instructed to contact office with any additional concerns. ER/RTC precautions were reviewed with the patient. Follow-up plan: Return if symptoms worsen or fail to improve in 2 - 4 weeks .

## 2016-03-21 NOTE — Patient Instructions (Signed)
If anything gets worse or changes, please let us know! Celebrex twice per day for the time being     Costochondritis Costochondritis, sometimes called Tietze syndrome, is a swelling and irritation (inflammation) of the tissue (cartilage) that connects your ribs with your breastbone (sternum). It causes pain in the chest and rib area. Costochondritis usually goes away on its own over time. It can take up to 6 weeks or longer to get better, especially if you are unable to limit your activities. CAUSES  Some cases of costochondritis have no known cause. Possible causes include:  Injury (trauma).  Exercise or activity such as lifting.  Severe coughing. SIGNS AND SYMPTOMS  Pain and tenderness in the chest and rib area.  Pain that gets worse when coughing or taking deep breaths.  Pain that gets worse with specific movements. DIAGNOSIS  Your health care provider will do a physical exam and ask about your symptoms. Chest X-rays or other tests may be done to rule out other problems. TREATMENT  Costochondritis usually goes away on its own over time. Your health care provider may prescribe medicine to help relieve pain. HOME CARE INSTRUCTIONS   Avoid exhausting physical activity. Try not to strain your ribs during normal activity. This would include any activities using chest, abdominal, and side muscles, especially if heavy weights are used.  Apply ice to the affected area for the first 2 days after the pain begins.  Put ice in a plastic bag.  Place a towel between your skin and the bag.  Leave the ice on for 20 minutes, 2-3 times a day.  Only take over-the-counter or prescription medicines as directed by your health care provider. SEEK MEDICAL CARE IF:  You have redness or swelling at the rib joints. These are signs of infection.  Your pain does not go away despite rest or medicine. SEEK IMMEDIATE MEDICAL CARE IF:   Your pain increases or you are very uncomfortable.  You have  shortness of breath or difficulty breathing.  You cough up blood.  You have worse chest pains, sweating, or vomiting.  You have a fever or persistent symptoms for more than 2-3 days.  You have a fever and your symptoms suddenly get worse. MAKE SURE YOU:   Understand these instructions.  Will watch your condition.  Will get help right away if you are not doing well or get worse.   This information is not intended to replace advice given to you by your health care provider. Make sure you discuss any questions you have with your health care provider.   Document Released: 03/14/2005 Document Revised: 03/25/2013 Document Reviewed: 01/06/2013 Elsevier Interactive Patient Education [employer].

## 2016-04-06 ENCOUNTER — Other Ambulatory Visit: Payer: Self-pay | Admitting: Physician Assistant

## 2016-04-16 ENCOUNTER — Encounter: Payer: Self-pay | Admitting: Physician Assistant

## 2016-04-16 ENCOUNTER — Ambulatory Visit (INDEPENDENT_AMBULATORY_CARE_PROVIDER_SITE_OTHER): Payer: BLUE CROSS/BLUE SHIELD | Admitting: Physician Assistant

## 2016-04-16 VITALS — BP 132/72 | HR 68 | Ht 68.0 in | Wt 206.0 lb

## 2016-04-16 DIAGNOSIS — I10 Essential (primary) hypertension: Secondary | ICD-10-CM | POA: Diagnosis not present

## 2016-04-16 DIAGNOSIS — R7301 Impaired fasting glucose: Secondary | ICD-10-CM

## 2016-04-16 DIAGNOSIS — F900 Attention-deficit hyperactivity disorder, predominantly inattentive type: Secondary | ICD-10-CM | POA: Diagnosis not present

## 2016-04-16 DIAGNOSIS — E6609 Other obesity due to excess calories: Secondary | ICD-10-CM | POA: Diagnosis not present

## 2016-04-16 DIAGNOSIS — R0789 Other chest pain: Secondary | ICD-10-CM | POA: Diagnosis not present

## 2016-04-16 DIAGNOSIS — D333 Benign neoplasm of cranial nerves: Secondary | ICD-10-CM

## 2016-04-16 DIAGNOSIS — H9192 Unspecified hearing loss, left ear: Secondary | ICD-10-CM

## 2016-04-16 LAB — POCT GLYCOSYLATED HEMOGLOBIN (HGB A1C): Hemoglobin A1C: 5.2

## 2016-04-16 MED ORDER — LISDEXAMFETAMINE DIMESYLATE 60 MG PO CAPS: 60.0000 mg | ORAL_CAPSULE | ORAL | 0 refills | Status: DC

## 2016-04-16 MED ORDER — NALTREXONE-BUPROPION HCL ER 8-90 MG PO TB12: 2.0000 | ORAL_TABLET | Freq: Two times a day (BID) | ORAL | 2 refills | Status: DC

## 2016-04-16 MED ORDER — CELECOXIB 200 MG PO CAPS: 200.0000 mg | ORAL_CAPSULE | Freq: Two times a day (BID) | ORAL | 5 refills | Status: DC

## 2016-04-16 MED ORDER — TRIAMTERENE-HCTZ 37.5-25 MG PO TABS: 1.0000 | ORAL_TABLET | Freq: Every day | ORAL | 5 refills | Status: DC

## 2016-04-16 NOTE — Progress Notes (Signed)
   Subjective:    Patient ID: Cynthia [name], female    DOB: Jun 19, 1960, 55 y.o.   MRN: [id number]  HPI  Pt is a 55 yo female who presents to the clinic for 3 month follow up.   ADD- she is doing great on current dose. She is not having any problems with focus. She denies any insomnia, palpitations, headaches, dizziness.   She was told her fasting sugar was 140 at work. She would like to make sure she does not have diabetes. No weakness, fatigue, increased thirst, increased urination.   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 [place] on November 13th.    Review of Systems  All other systems reviewed and are negative.      Objective:   Physical Exam  Constitutional: She is oriented to person, place, and time. She appears well-developed and well-nourished.  HENT:  Head: Normocephalic and atraumatic.  Cardiovascular: Normal rate, regular rhythm and normal heart sounds.   Pulmonary/Chest: Effort normal and breath sounds normal.  Neurological: She is alert and oriented to person, place, and time.  Psychiatric: She has a normal mood and affect. Her behavior is normal.          Assessment & Plan:  [name] KitchenMarland KitchenJanni was seen today for adhd and obesity.  Diagnoses and all orders for this visit:  Essential (primary) hypertension -     triamterene-hydrochlorothiazide (MAXZIDE-25) 37.5-25 MG tablet; Take 1 tablet by mouth daily.  Attention deficit hyperactivity disorder (ADHD), predominantly inattentive type -     lisdexamfetamine (VYVANSE) 60 MG capsule; Take 1 capsule (60 mg total) by mouth every morning. Do not refill until 06/14/16.  IFG (impaired fasting glucose) -     POCT HgB A1C  Atypical chest pain -     celecoxib (CELEBREX) 200 MG capsule; Take 1 capsule (200 mg total) by mouth 2 (two) times daily.  Deafness in left ear  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HCC)  Class 1 obesity due to excess calories without serious comorbidity in adult, unspecified BMI -      Naltrexone-Bupropion HCl ER (CONTRAVE) 8-90 MG TB12; Take 2 tablets by mouth 2 (two) times daily. -     POCT HgB A1C  Other orders -     Discontinue: lisdexamfetamine (VYVANSE) 60 MG capsule; Take 1 capsule (60 mg total) by mouth every morning. -     Discontinue: lisdexamfetamine (VYVANSE) 60 MG capsule; Take 1 capsule (60 mg total) by mouth every morning. Do not refill until 05/15/16.   Pt has lost 8lbs over past 3 months. Continue on contrave for next 3 months. Discussed exercise and other diet changes. Goal of 10lbs in next 3 months.   IFG- .. Lab Results  Component Value Date   HGBA1C 5.2 04/16/2016   5.2 looks great. Reassured patient NO diabetes.

## 2016-05-06 ENCOUNTER — Other Ambulatory Visit: Payer: Self-pay | Admitting: Internal Medicine

## 2016-05-29 IMAGING — US US ABDOMEN COMPLETE
1 series · 13 of 25 positions shown · non-contrast
Comparison: Report of an abdominal and pelvic CT scan [DATE]

CLINICAL DATA: Several month history of intermittent left upper
quadrant pain. History of previous cholecystectomy. Multiple gyn and
bladder surgeries.

EXAM:
ABDOMEN ULTRASOUND COMPLETE

[Series 1: us abdomen complete · 0.31mm/px · 13 of 88 slices shown]
[im 1/88]
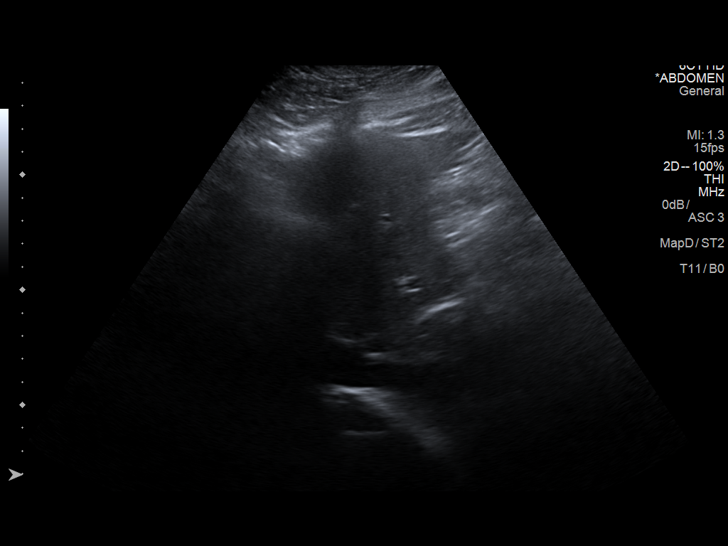
[im 8/88]
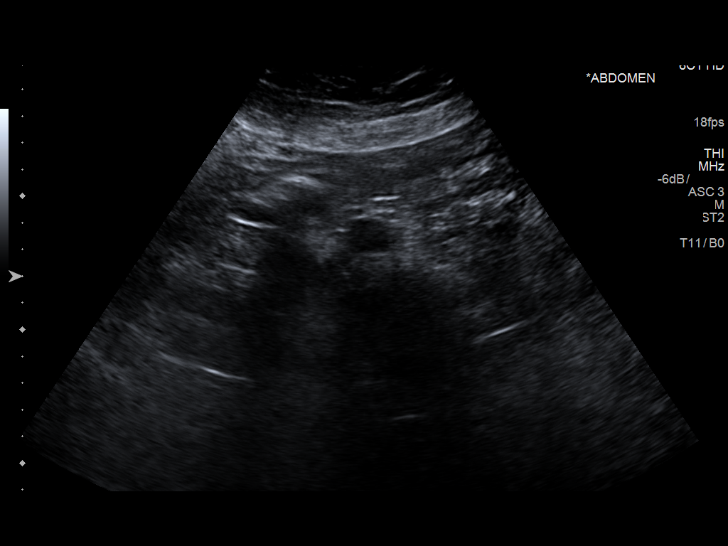
[im 15/88]
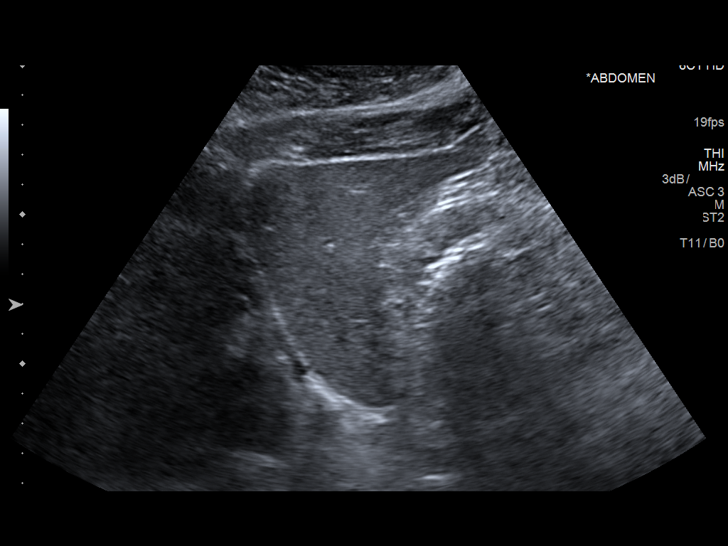
[im 22/88]
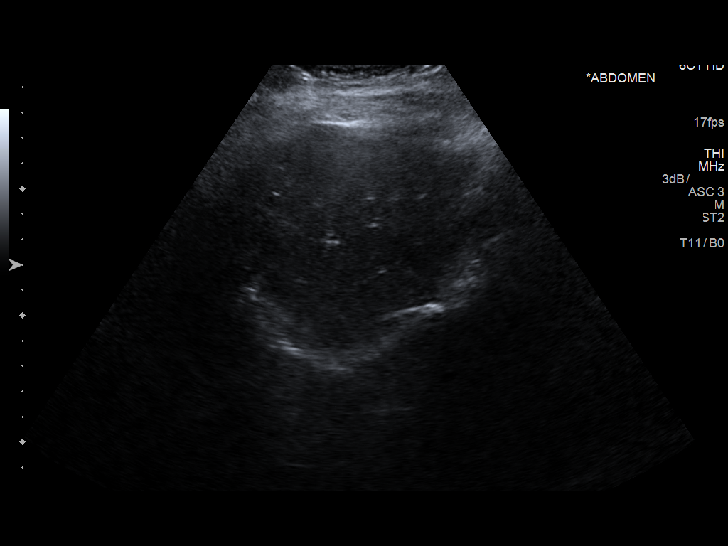
[im 30/88]
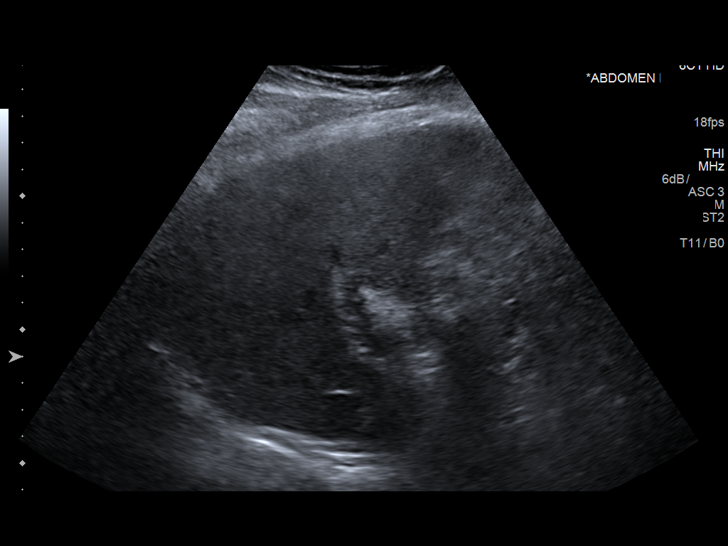
[im 37/88]
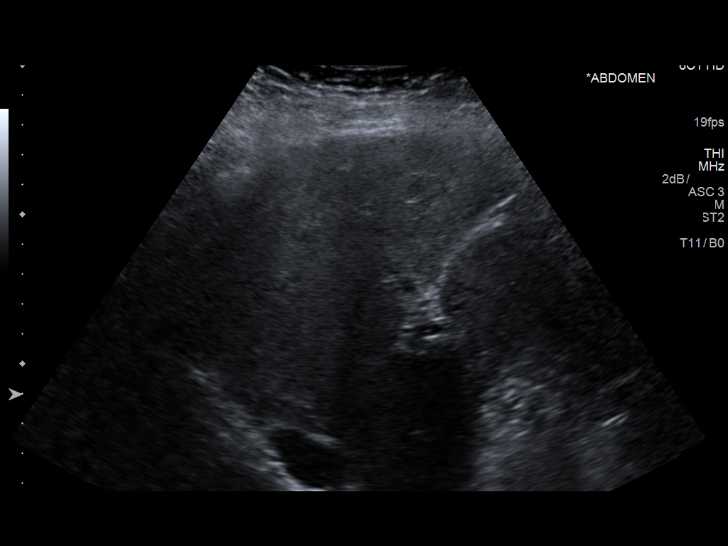
[im 44/88]
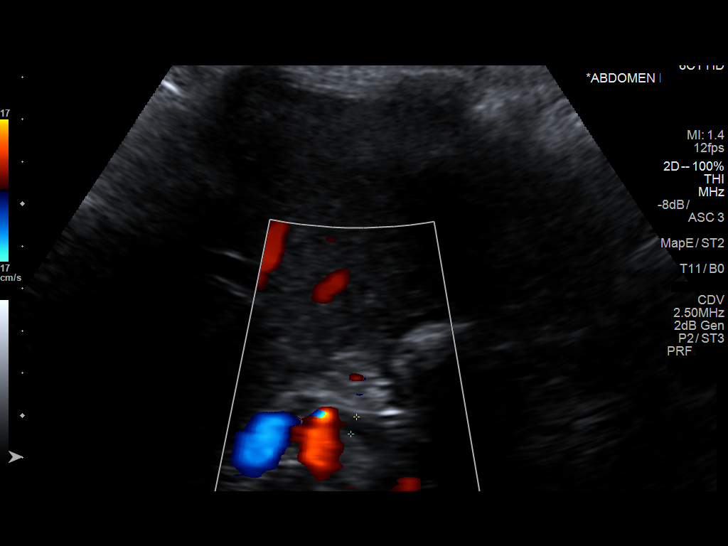
[im 51/88]
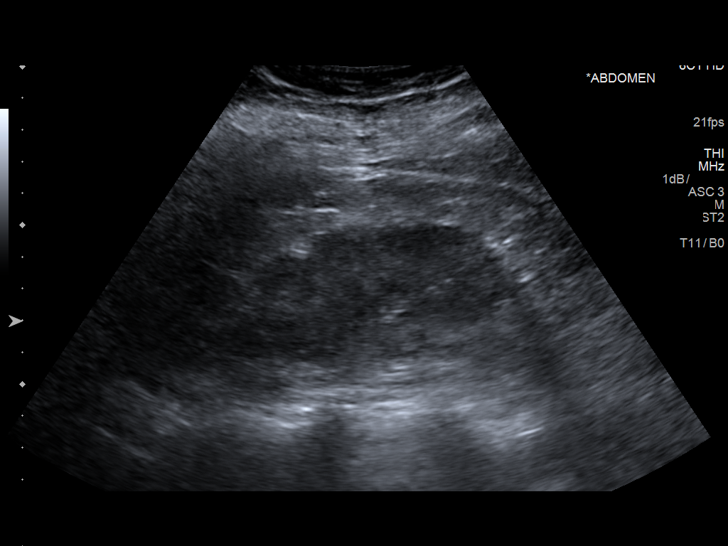
[im 59/88]
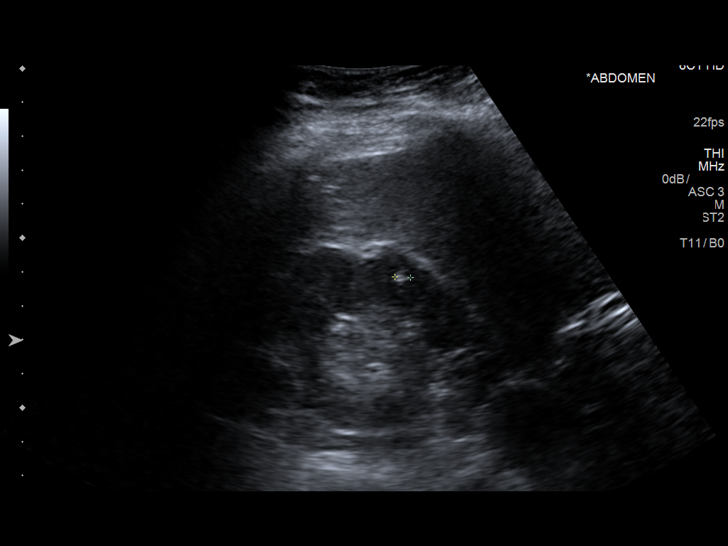
[im 66/88]
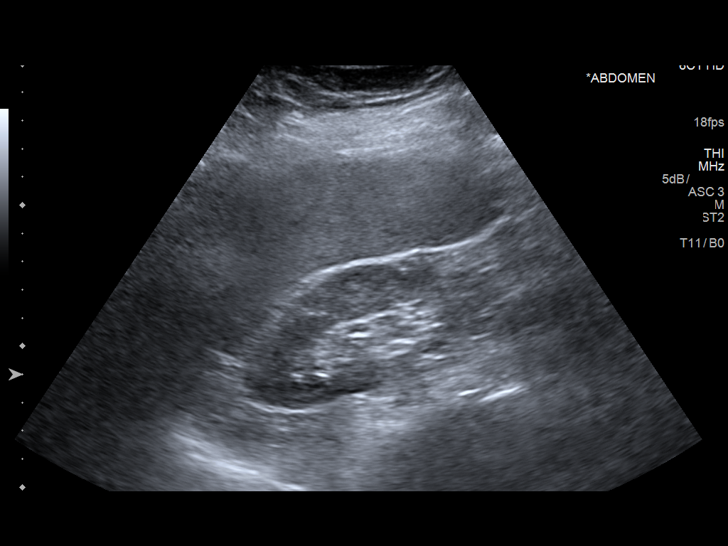
[im 73/88]
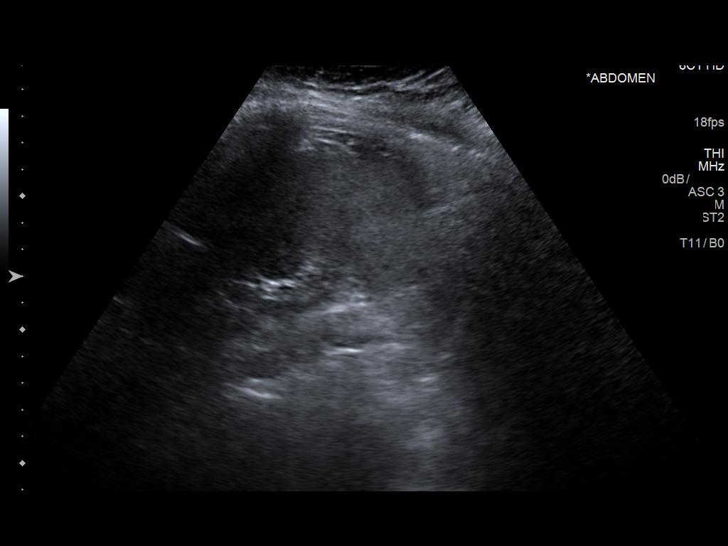
[im 80/88]
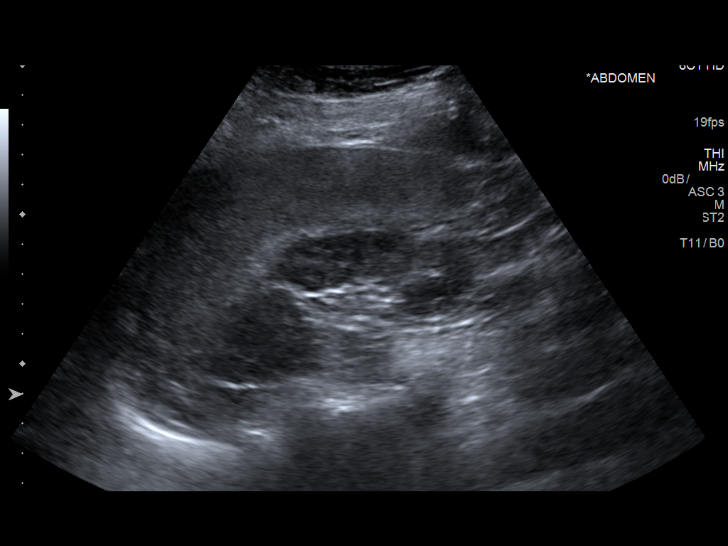
[im 88/88]
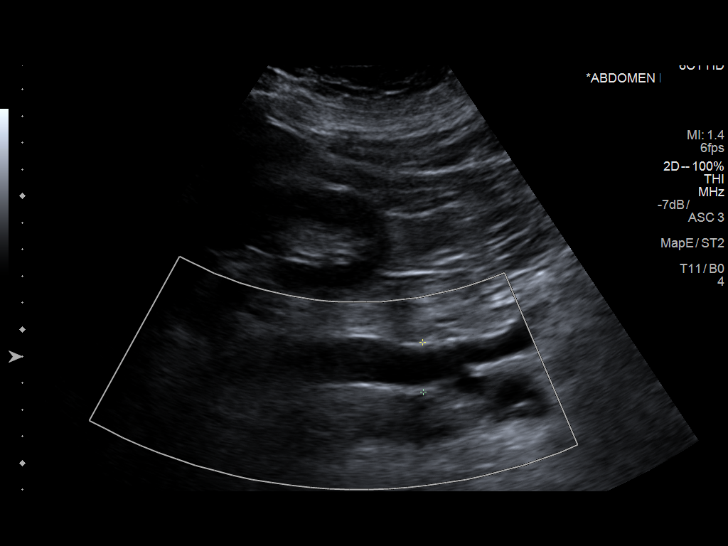

[13 of 25 positions shown; findings below may reference images not displayed]

FINDINGS: Gallbladder: The gallbladder is surgically absent.

Common bile duct: Diameter: 4.3 mm

Liver: The hepatic echotexture is mildly increased. The surface
contour is smooth. There is no focal mass or ductal dilation. Portal
vein is patent on color Doppler imaging with normal direction of
blood flow towards the liver.

IVC: No abnormality visualized.

Pancreas: Bowel gas largely obscures the pancreatic head and tail.
The limited portion of the pancreatic body is grossly normal.

Spleen: There is mild splenomegaly with over a length of 13.6 cm and
calculated volume of 467 cc.

Right Kidney: Length: 10.8 cm. The renal cortical echotexture is
normal. There is a focal 5 mm hyperechoic focus in the cortex which
does not shadow distally. There is no hydronephrosis.

Left Kidney: Length: 11.3 cm. Echogenicity within normal limits. No
mass or hydronephrosis visualized.

Abdominal aorta: No aneurysm visualized.

Other findings: There is no ascites.
IMPRESSION: Increased hepatic echotexture likely reflects fatty infiltrative
change. Previous cholecystectomy. Limited visualization of the
pancreas.

Mild splenomegaly which may be normal for the patient's body
habitus.

5 mm midpole right renal cortical hyperechoic focus most compatible
with an angiomyolipoma.

## 2016-06-06 ENCOUNTER — Emergency Department (INDEPENDENT_AMBULATORY_CARE_PROVIDER_SITE_OTHER)
Admission: EM | Admit: 2016-06-06 | Discharge: 2016-06-06 | Disposition: A | Discharge status: Home / Self Care | Payer: BLUE CROSS/BLUE SHIELD | Source: Home / Self Care | Attending: Family Medicine | Admitting: Family Medicine

## 2016-06-06 ENCOUNTER — Encounter: Payer: Self-pay | Admitting: *Deleted

## 2016-06-06 DIAGNOSIS — B029 Zoster without complications: Secondary | ICD-10-CM | POA: Diagnosis not present

## 2016-06-06 HISTORY — DX: Benign neoplasm of cranial nerves: D33.3

## 2016-06-06 MED ORDER — VALACYCLOVIR HCL 1 G PO TABS: 1000.0000 mg | ORAL_TABLET | Freq: Three times a day (TID) | ORAL | 0 refills | Status: DC

## 2016-06-06 NOTE — ED Triage Notes (Signed)
Pt c/o burning, itching rash on RT upper back x 1 day. She also reports some RT eye itching today.

## 2016-06-06 NOTE — ED Provider Notes (Signed)
CSN: [id number]     Arrival date & time 06/06/16  1707 History   First MD Initiated Contact with Patient 06/06/16 1728     Chief Complaint  Patient presents with  . Rash   (Consider location/radiation/quality/duration/timing/severity/associated sxs/prior Treatment) HPI  Cynthia [name] is a 55 y.o. female presenting to UC with c/o itching burning rash to the back of her Right shoulder and upper back since yesterday.  Pt notes her friend took a look and told her it looked like shingles as her friend has had shingles in the past.  Pain is 5/10, controlled with ibuprofen. Pt notes she has also had some mild itching to the Right of her Right eye but denies having pain or a rash so she is unsure if it is related.  She notes she has felt mild fatigue and aching soreness in Right shoulder for about 1 week but figured she had overused it and it was muscle pain.  Denies fever or chills. No prior hx of shingles.  She has been under a lot of stress this year as she had brain surgery for an acustic neuroma as well as stress due to getting ready for family coming for the holidays.    Past Medical History:  Diagnosis Date  . Acoustic neuroma ([place])   . ADD (attention deficit disorder)    and OCD-on Vyvanse  . Asthma    w/out status asthmaticus  . Awareness under anesthesia   . Benign brain tumor ([place])   . Chronic back pain   . Constipation, chronic    IBS, CIC  . Diabetes mellitus   . Difficult airway   . 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 GERD (gastroesophageal reflux disease)   . Iron deficiency anemia, unspecified 02/18/2013  . Obesity    296 lbs (highest weight in 2012)   . Tendonitis, Achilles, left   . Vertigo    Past Surgical History:  Procedure Laterality Date  . ABLATION     and bladder sling  . ACHILLES TENDON REPAIR     x2  . ANAL FISSURE REPAIR  2012  . BACK SURGERY    . CHOLECYSTECTOMY    . IMPLANTATION BONE ANCHORED HEARING AID    . iron infusion    . MOUTH  SURGERY  2015   3 implants  . OVARIAN CYST REMOVAL     x4  . THYROID LOBECTOMY Bilateral    Partial   . TOE FUSION Left    Family History  Problem Relation Age of Onset  . Thyroid disease Mother   . Heart Problems Mother     vavle issue  . Dystonia Mother   . Hypercholesterolemia Mother   . Heart attack Father   . Cancer Father     esophageal  . Heart disease Father   . Lung cancer Paternal Grandfather   . Heart disease Paternal Grandfather   . Heart disease Maternal Grandfather   . Heart disease Paternal Grandmother   . Heart disease Other     paternal and maternal side   Social History  Substance Use Topics  . Smoking status: Never Smoker  . Smokeless tobacco: Never Used  . Alcohol use 0.5 - 1.0 oz/week    1 - 2 Standard drinks or equivalent per week   OB History    Gravida Para Term Preterm AB Living   0 0 0 0 0 0   SAB TAB Ectopic Multiple Live Births  0 0 0 0       Review of Systems  Constitutional: Positive for fatigue ( mild). Negative for chills.  HENT: Negative for facial swelling and sore throat.   Eyes: Negative for photophobia, pain, discharge, redness and itching.  Respiratory: Negative for chest tightness and shortness of breath.   Musculoskeletal: Positive for arthralgias and myalgias. Negative for joint swelling.  Skin: Positive for rash. Negative for wound.    Allergies  Mobic [meloxicam] and Multivitamins  Home Medications   Prior to Admission medications   Medication Sig Start Date End Date Taking? Authorizing Provider  celecoxib (CELEBREX) 200 MG capsule Take 1 capsule (200 mg total) by mouth 2 (two) times daily. 04/16/16   Jade L Breeback, PA-C  cyclobenzaprine (FLEXERIL) 10 MG tablet as needed. 08/02/15   Jade L Breeback, PA-C  dexlansoprazole (DEXILANT) 60 MG capsule Take 60 mg by mouth daily.    Historical Provider, MD  docusate sodium (COLACE) 50 MG capsule Take 100 mg by mouth at bedtime.    Historical Provider, MD  estradiol  (ESTRACE VAGINAL) 0.1 MG/GM vaginal cream INSERT 1 GRAM VAGINALLY TWICE A WEEK 08/12/15   Megan Salon, MD  Fluticasone-Salmeterol (ADVAIR) 100-50 MCG/DOSE AEPB Inhale 1 puff into the lungs 2 (two) times daily.    Historical Provider, MD  levothyroxine (SYNTHROID, LEVOTHROID) 25 MCG tablet TAKE 2 TABLETS (50 MCG TOTAL) BY MOUTH DAILY BEFORE BREAKFAST. 05/07/16   Philemon Kingdom, MD  LINZESS 290 MCG CAPS capsule Take 290 mcg by mouth daily.  02/06/13   Historical Provider, MD  lisdexamfetamine (VYVANSE) 60 MG capsule Take 1 capsule (60 mg total) by mouth every morning. Do not refill until 06/14/16. 04/16/16   Donella Stade, PA-C  Naltrexone-Bupropion HCl ER (CONTRAVE) 8-90 MG TB12 Take 2 tablets by mouth 2 (two) times daily. 04/16/16   Jade L Breeback, PA-C  triamterene-hydrochlorothiazide (MAXZIDE-25) 37.5-25 MG tablet Take 1 tablet by mouth daily. 04/16/16   Jade L Breeback, PA-C  valACYclovir (VALTREX) 1000 MG tablet Take 1 tablet (1,000 mg total) by mouth 3 (three) times daily. 06/06/16   Noland Fordyce, PA-C   Meds Ordered and Administered this Visit  Medications - No data to display  BP 142/85 (BP Location: Left Arm)   Pulse 74   Temp 97.6 F (36.4 C) (Oral)   Resp 18   Ht 5' 8.5" (1.74 m)   Wt 208 lb (94.3 kg)   SpO2 100%   BMI 31.17 kg/m  No data found.   Physical Exam  Constitutional: She is oriented to person, place, and time. She appears well-developed and well-nourished.  HENT:  Head: Normocephalic and atraumatic.  Right Ear: Tympanic membrane normal.  Nose: Nose normal.  Mouth/Throat: Uvula is midline, oropharynx is clear and moist and mucous membranes are normal.  Eyes: EOM are normal.  Neck: Normal range of motion.  Cardiovascular: Normal rate.   Pulmonary/Chest: Effort normal.  Musculoskeletal: Normal range of motion.  Neurological: She is alert and oriented to person, place, and time.  Skin: Skin is warm and dry. Rash noted. There is erythema.  No rash or  erythema of face. Right shoulder, upper arm and Right upper back: diffuse erythematous papular rash. No vesicles noted. No bleeding or drainage.  Psychiatric: She has a normal mood and affect. Her behavior is normal.  Nursing note and vitals reviewed.   Urgent Care Course   Clinical Course     Procedures (including critical care time)  Labs Review Labs Reviewed - No  data to display  Imaging Review No results found.    MDM   1. Herpes zoster without complication    Pt c/o burning and itching rash to Right shoulder and upper back. Pt notes mild itching to Right side of her face but no rashes noted at this time.  Rx: Valcyclovir Encouraged to start this evening. F/u with PCP next week for recheck of symptoms as needed. Strongly advised to f/u with an eye specialist or ENT if she does develop a rash on her face or eye pain or vision changes. Pt info packet provided. Patient verbalized understanding and agreement with treatment plan.     Noland Fordyce, PA-C 06/06/16 1747

## 2016-06-06 NOTE — Discharge Instructions (Signed)
°  If you develop a rash on your face, especially around eye or even the tip of your nose, it is important to follow up with an eye specialist for further evaluation to make sure the rash is not inside your eye.

## 2016-06-07 ENCOUNTER — Encounter: Payer: Self-pay | Admitting: Family Medicine

## 2016-06-07 ENCOUNTER — Ambulatory Visit: Payer: BLUE CROSS/BLUE SHIELD | Admitting: Family Medicine

## 2016-06-07 ENCOUNTER — Ambulatory Visit (INDEPENDENT_AMBULATORY_CARE_PROVIDER_SITE_OTHER): Payer: BLUE CROSS/BLUE SHIELD | Admitting: Family Medicine

## 2016-06-07 VITALS — BP 132/67 | HR 74 | Ht 68.0 in | Wt 209.0 lb

## 2016-06-07 DIAGNOSIS — R079 Chest pain, unspecified: Secondary | ICD-10-CM

## 2016-06-07 DIAGNOSIS — H9201 Otalgia, right ear: Secondary | ICD-10-CM

## 2016-06-07 DIAGNOSIS — R21 Rash and other nonspecific skin eruption: Secondary | ICD-10-CM

## 2016-06-07 DIAGNOSIS — R0789 Other chest pain: Secondary | ICD-10-CM

## 2016-06-07 MED ORDER — BETAMETHASONE DIPROPIONATE 0.05 % EX CREA: TOPICAL_CREAM | Freq: Two times a day (BID) | CUTANEOUS | 0 refills | Status: DC

## 2016-06-07 NOTE — Progress Notes (Signed)
Subjective:    Patient ID: Cynthia [name], female    DOB: 1961-06-10, 55 y.o.   MRN: [id number]  HPI Rash started on her right shoulder and now having some pain on her right chest.  She had been using ibuprofen for the discomfort. She actually went to urgent care yesterday evening and was diagnosed with shingles. He is very concerned about the chest pain in particular. The rash doesn't actually extend into the right anterior portion of her chest but that's where she's getting some pain. She has had a prior history of costochondritis but that hurt more in the center of her chest.  Also has some itching/burning in her right hear. She was started on valacyclovir.  No fevers chills or sweats. She's not had any drainage from either ear. She also like me to take a look in her left ear as well. She's not had any new symptoms but just has constant ringing. She had an acoustic neuroma removed from the left ear back in March of this year.  Review of Systems  BP 132/67   Pulse 74   Ht 5\' 8"  (1.727 m)   Wt 209 lb (94.8 kg)   SpO2 100%   BMI 31.78 kg/m     Allergies  Allergen Reactions  . Mobic [Meloxicam] Nausea And Vomiting  . Multivitamins Other (See Comments)    Past Medical History:  Diagnosis Date  . Acoustic neuroma ([place])   . ADD (attention deficit disorder)    and OCD-on Vyvanse  . Asthma    w/out status asthmaticus  . Awareness under anesthesia   . Benign brain tumor ([place])   . Chronic back pain   . Constipation, chronic    IBS, CIC  . Diabetes mellitus   . Difficult airway   . 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 GERD (gastroesophageal reflux disease)   . Iron deficiency anemia, unspecified 02/18/2013  . Obesity    296 lbs (highest weight in 2012)   . Tendonitis, Achilles, left   . Vertigo     Past Surgical History:  Procedure Laterality Date  . ABLATION     and bladder sling  . ACHILLES TENDON REPAIR     x2  . ANAL FISSURE REPAIR  2012  . BACK SURGERY     . CHOLECYSTECTOMY    . IMPLANTATION BONE ANCHORED HEARING AID    . iron infusion    . MOUTH SURGERY  2015   3 implants  . OVARIAN CYST REMOVAL     x4  . THYROID LOBECTOMY Bilateral    Partial   . TOE FUSION Left     Social History   Social History  . Marital status: Married    Spouse name: N/A  . Number of children: N/A  . Years of education: N/A   Occupational History  . Not on file.   Social History Main Topics  . Smoking status: Never Smoker  . Smokeless tobacco: Never Used  . Alcohol use 0.5 - 1.0 oz/week    1 - 2 Standard drinks or equivalent per week  . Drug use: No  . Sexual activity: Yes    Partners: Male    Birth control/ protection: Other-see comments     Comment: vasectomy   Other Topics Concern  . Not on file   Social History Narrative   Marital Status:  Married [name])    Children:  None    Pets: Dog (01)  Living Situation: Lives with husband     Occupation:  IT - [employer]    Education:  Master's Degree    Tobacco Use/Exposure:  None    Alcohol Use:  Occasional   Drug Use:  None   Diet:  Regular   Exercise:  Kick-boxing and boot camp - 5 times per week    Hobbies: Golf, tennis and working out                Family History  Problem Relation Age of Onset  . Thyroid disease Mother   . Heart Problems Mother     vavle issue  . Dystonia Mother   . Hypercholesterolemia Mother   . Heart attack Father   . Cancer Father     esophageal  . Heart disease Father   . Lung cancer Paternal Grandfather   . Heart disease Paternal Grandfather   . Heart disease Maternal Grandfather   . Heart disease Paternal Grandmother   . Heart disease Other     paternal and maternal side    Outpatient Encounter Prescriptions as of 06/07/2016  Medication Sig  . celecoxib (CELEBREX) 200 MG capsule Take 1 capsule (200 mg total) by mouth 2 (two) times daily.  . cyclobenzaprine (FLEXERIL) 10 MG tablet as needed.  [name] Kitchen dexlansoprazole (DEXILANT)  60 MG capsule Take 60 mg by mouth daily.  [name] Kitchen docusate sodium (COLACE) 50 MG capsule Take 100 mg by mouth at bedtime.  [name] Kitchen estradiol (ESTRACE VAGINAL) 0.1 MG/GM vaginal cream INSERT 1 GRAM VAGINALLY TWICE A WEEK  . Fluticasone-Salmeterol (ADVAIR) 100-50 MCG/DOSE AEPB Inhale 1 puff into the lungs 2 (two) times daily.  [name] Kitchen levothyroxine (SYNTHROID, LEVOTHROID) 25 MCG tablet TAKE 2 TABLETS (50 MCG TOTAL) BY MOUTH DAILY BEFORE BREAKFAST.  [name] Kitchen LINZESS 290 MCG CAPS capsule Take 290 mcg by mouth daily.   [name] Kitchen lisdexamfetamine (VYVANSE) 60 MG capsule Take 1 capsule (60 mg total) by mouth every morning. Do not refill until 06/14/16.  . Naltrexone-Bupropion HCl ER (CONTRAVE) 8-90 MG TB12 Take 2 tablets by mouth 2 (two) times daily.  [name] Kitchen triamterene-hydrochlorothiazide (MAXZIDE-25) 37.5-25 MG tablet Take 1 tablet by mouth daily.  . valACYclovir (VALTREX) 1000 MG tablet Take 1 tablet (1,000 mg total) by mouth 3 (three) times daily.  . betamethasone dipropionate (DIPROLENE) 0.05 % cream Apply topically 2 (two) times daily.   No facility-administered encounter medications on file as of 06/07/2016.          Objective:   Physical Exam  Constitutional: She is oriented to person, place, and time. She appears well-developed and well-nourished.  HENT:  Head: Normocephalic and atraumatic.  Right Ear: External ear normal.  Left Ear: External ear normal.  Nose: Nose normal.  Mouth/Throat: Oropharynx is clear and moist.  TMs and canals are clear.   Eyes: Conjunctivae and EOM are normal. Pupils are equal, round, and reactive to light.  Neck: Neck supple. No thyromegaly present.  Cardiovascular: Normal rate, regular rhythm and normal heart sounds.   Pulmonary/Chest: Effort normal and breath sounds normal. She has no wheezes.  Lymphadenopathy:    She has no cervical adenopathy.  Neurological: She is alert and oriented to person, place, and time.  Skin: Skin is warm and dry.  Psychiatric: She has a normal mood and  affect.       Assessment & Plan:  Rash on upper right shoulder-unclear if herpes zoster or some other type of rash. Today it looks like it's dry and healing. And it looks like  there are almost excoriations over the lesion. No pustules or open wounds. I am going to give her topical steroid cream to see if this actually improves healing the area looks very dry.  Atypical right-sided chest pain-did do an EKG today just to make sure that everything looked okay. EKG was normal. Gave reassurance. 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 I went back and looked EKGs going on Lidex she doesn't and 13 and it has been there for very long time. No changes.  Right ear pain-exam is completely normal. Consider following up with her ENT if it does not improve as her rash heals. I did not see any rash in the ear itself.

## 2016-06-10 ENCOUNTER — Other Ambulatory Visit: Payer: Self-pay | Admitting: Physician Assistant

## 2016-06-10 DIAGNOSIS — R0789 Other chest pain: Secondary | ICD-10-CM

## 2016-07-09 ENCOUNTER — Other Ambulatory Visit: Payer: Self-pay | Admitting: Obstetrics & Gynecology

## 2016-07-09 NOTE — Telephone Encounter (Addendum)
Medication refill request: estradiol  Last AEX:  08/12/15 SM  Next AEX: 11/30/16 SM  Last MMG (if hormonal medication request): 02/23/15 BIRADS1:Neg  Refill authorized: 08/12/15 #42.5g/2R. Today please advise.   Patient states she will schedule MMG as soon as possible. She didn't realize she need it every year.

## 2016-07-17 ENCOUNTER — Ambulatory Visit: Payer: BLUE CROSS/BLUE SHIELD | Admitting: Physician Assistant

## 2016-07-17 DIAGNOSIS — E669 Obesity, unspecified: Secondary | ICD-10-CM | POA: Diagnosis not present

## 2016-07-17 DIAGNOSIS — K449 Diaphragmatic hernia without obstruction or gangrene: Secondary | ICD-10-CM | POA: Diagnosis not present

## 2016-07-17 DIAGNOSIS — K5904 Chronic idiopathic constipation: Secondary | ICD-10-CM | POA: Diagnosis not present

## 2016-07-17 DIAGNOSIS — K219 Gastro-esophageal reflux disease without esophagitis: Secondary | ICD-10-CM | POA: Diagnosis not present

## 2016-07-20 ENCOUNTER — Encounter: Payer: Self-pay | Admitting: Physician Assistant

## 2016-07-20 ENCOUNTER — Ambulatory Visit (INDEPENDENT_AMBULATORY_CARE_PROVIDER_SITE_OTHER): Payer: BLUE CROSS/BLUE SHIELD | Admitting: Physician Assistant

## 2016-07-20 VITALS — BP 130/81 | HR 74 | Ht 68.0 in | Wt 212.0 lb

## 2016-07-20 DIAGNOSIS — D696 Thrombocytopenia, unspecified: Secondary | ICD-10-CM

## 2016-07-20 DIAGNOSIS — Z23 Encounter for immunization: Secondary | ICD-10-CM | POA: Diagnosis not present

## 2016-07-20 DIAGNOSIS — F900 Attention-deficit hyperactivity disorder, predominantly inattentive type: Secondary | ICD-10-CM

## 2016-07-20 DIAGNOSIS — R454 Irritability and anger: Secondary | ICD-10-CM

## 2016-07-20 MED ORDER — BUPROPION HCL ER (XL) 300 MG PO TB24: 300.0000 mg | ORAL_TABLET | Freq: Every day | ORAL | 2 refills | Status: DC

## 2016-07-20 MED ORDER — LISDEXAMFETAMINE DIMESYLATE 60 MG PO CAPS: 60.0000 mg | ORAL_CAPSULE | ORAL | 0 refills | Status: DC

## 2016-07-20 NOTE — Progress Notes (Signed)
   Subjective:    Patient ID: Cynthia [name], female    DOB: [birth year], 56 y.o.   MRN: [id number]  HPI Pt is a 56 yo female who presents to the clinic for 3 month ADHD follow up. She is doing well on current dose. No concerns or complaints. She is having no problems at work. She denies any palpitations, anorexia or insomnia.   She recently had a grandson who was born 66 weeks early. She wants Tdap.   She had labs done at GI. Her platelets were low at 132. She comes in here for follow up. She denies any black tarry stools or hematuria.    Review of Systems  All other systems reviewed and are negative.      Objective:   Physical Exam  Constitutional: She is oriented to person, place, and time. She appears well-developed and well-nourished.  HENT:  Head: Normocephalic and atraumatic.  Cardiovascular: Normal rate, regular rhythm and normal heart sounds.   Pulmonary/Chest: Effort normal and breath sounds normal. She has no wheezes.  Neurological: She is alert and oriented to person, place, and time.  Psychiatric: She has a normal mood and affect. Her behavior is normal.          Assessment & Plan:  [name] KitchenMarland KitchenDiagnoses and all orders for this visit:  Thrombocytopenia ([place]) -     CBC w/Diff/Platelet -     Ferritin  Attention deficit hyperactivity disorder (ADHD), predominantly inattentive type -     Discontinue: lisdexamfetamine (VYVANSE) 60 MG capsule; Take 1 capsule (60 mg total) by mouth every morning. -     Discontinue: lisdexamfetamine (VYVANSE) 60 MG capsule; Take 1 capsule (60 mg total) by mouth every morning. Do not refill until 08/16/16 -     lisdexamfetamine (VYVANSE) 60 MG capsule; Take 1 capsule (60 mg total) by mouth every morning. Do not refill until 09/16/16  Irritable -     buPROPion (WELLBUTRIN XL) 300 MG 24 hr tablet; Take 1 tablet (300 mg total) by mouth daily.  Need for Tdap vaccination -     Tdap vaccine greater than or equal to 7yo IM   Pt has always been on low side  with platelets. Will recheck in 2 weeks and see if level has improved or continues to decline. Cut back to celebrex once a day.   3 months of vyvanse given.   contrave is not helping with weight loss it is helping with irritability.  Start wellbutrin alone.

## 2016-07-20 NOTE — Patient Instructions (Signed)
Thrombocytopenia Thrombocytopenia means that you have a low number of platelets in your blood. Platelets are tiny cells in the blood. When you bleed, they clump together at the cut or injury to stop the bleeding. This is called blood clotting. Not having enough platelets can cause bleeding problems. Follow these instructions at home: General instructions  Check your skin and inside your mouth for bruises or blood as told by your doctor.  Check to see if there is blood in your spit (sputum), pee (urine), and poop (stool). Do this as told by your doctor.  Ask your doctor if you can drink alcohol.  Take over-the-counter and prescription medicines only as told by your doctor.  Tell all of your doctors that you have this condition. Be sure to tell your dentist and eye doctor too. Activity  Do not do activities that can cause bumps or bruises until your doctor says it is okay.  Be careful not to cut yourself:  When you shave.  When you use scissors, needles, knives, or other tools.  Be careful not to burn yourself:  When you use an iron.  When you cook. Contact a doctor if:  You have bruises and you do not know why. Get help right away if:  You are bleeding anywhere on your body.  You have blood in your spit, pee, or poop. This information is not intended to replace advice given to you by your health care provider. Make sure you discuss any questions you have with your health care provider. Document Released: 05/24/2011 Document Revised: 02/05/2016 Document Reviewed: 12/06/2014 Elsevier Interactive Patient Education  2017 [employer]  

## 2016-07-23 ENCOUNTER — Encounter: Payer: Self-pay | Admitting: Internal Medicine

## 2016-07-23 ENCOUNTER — Ambulatory Visit (INDEPENDENT_AMBULATORY_CARE_PROVIDER_SITE_OTHER): Payer: BLUE CROSS/BLUE SHIELD | Admitting: Internal Medicine

## 2016-07-23 VITALS — BP 132/92 | HR 78 | Ht 67.5 in | Wt 212.0 lb

## 2016-07-23 DIAGNOSIS — E063 Autoimmune thyroiditis: Secondary | ICD-10-CM | POA: Diagnosis not present

## 2016-07-23 DIAGNOSIS — D34 Benign neoplasm of thyroid gland: Secondary | ICD-10-CM

## 2016-07-23 NOTE — Patient Instructions (Signed)
Please continue Levothyroxine 50 mcg daily.  Take the thyroid hormone every day, with water, at least 30 minutes before breakfast, separated by at least 4 hours from: - acid reflux medications - calcium - iron - multivitamins  Please move Dexilant to after lunch.  Come back in 5-6 weeks for labs.  You will be called with the U/S schedule.  Please return in 1 year.

## 2016-07-23 NOTE — Progress Notes (Addendum)
Patient ID: Cynthia [name], female   DOB: 10-Dec-1960, 56 y.o.   MRN: [id number]   HPI  Cynthia [name] is a 56 y.o.-year-old female, returning for f/u for thyroid cancer and postsurgical hypothyroidism. Last visit 10 mo ago.  Since last visit, she had another intervention on her L ear but could not gain hearing back >> now has an implant.  She had the shingles over the Holidays.  Thyroid neoplasm hx: She noticed a lump in her throat in 2015 and had hoarseness. Saw PCP >> thyroid U/S >> thyroid nodule >> FNA: Suspicious for follicular neoplasm >> referred to ENT >> R hemithyroidectomy (Dr Simonne Maffucci). 02/22/2015:  Thyroid ultrasound:  Right thyroid lobe : 6.4 x 2.5 x 2.3 cm. Heterogeneous appearance. Solid nodule measuring 2.4 x 1.8 x 1.9 centimeters, without internal calcification , with hypoechoic margin and no significant internal blood flow.  Left thyroid lobe, 5.6 x 1.7 x 1.5 cm. Heterogeneous appearance. No focal nodules.  Isthmus : 4 mm in thickness, no nodules visualized.  No lymphadenopathy visualized. 03/29/2015: Right thyroid nodule FNA: Suspicious for a follicular neoplasm (Bethesda category IV) 04/28/2015: R Hemithyroidectomy by Dr Simonne Maffucci ([place]) Path: A. 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 Resection margins are negative. Note: Dr. Vernell Barrier has also reviewed this case and concurs. JAMA Oncol. 2016; 2(8):1023-29. A. "Right thyroid node, stitch at superior pole", in formalin. Received is a 14g, 4.5 x 3.0 x 2.5 cm lobe of thyroid. Inked as follows: Anterior - blue, posterior - black, ischemic margin - yellow. Sectioning reveals a 2.4 x 1.5 x 2.4 cm well-circumscribed nodule with a homogenous firm tan surface. The nodule is in the inferior portion of the lobe and markedly distorts the anterior and posterior capsule. In the superior portion of the lobe there are multiple white areas, which range  from 0.2 to 0.7 cm in greatest dimension. Additionally received is detached brown fragment, 1.2 x 1.0 x 0.3 cm.  Block summary: A1-A2 - Full thickness section of nodule, bisected A3-A4 - Full thickness section of nodule, bisected and including ischemic margin A5-A7 - Representative section of superior portion of thyroid A8 - Detached fragment completely submitted A9-A15 - Remainder of tumor capsule, entirely from superior to inferior  Pt denies feeling nodules in neck, hoarseness, dysphagia/odynophagia, SOB with lying down.  She has a combination of postsurgical + Hashimoto's hypothyroidism. She is on Levothyroxine 50 mcg: - in am (6 am) - fasting - eats 2 hours later - Dexilant at bedtime (9 pm) - no Fe, Ca, MVI  I reviewed pt's thyroid tests:  07/17/2016: TSH 5.160 (0.45-4.5) Lab Results  Component Value Date   TSH 1.72 10/07/2015   TSH 4.44 07/21/2015   TSH 4.05 06/08/2015   TSH 2.669 02/22/2015   TSH 2.489 10/15/2013   FREET4 1.10 10/07/2015   FREET4 1.11 07/21/2015   FREET4 0.87 06/08/2015   FREET4 1.13 02/22/2015    Pt describes: - +  fatigue - + weight gain - + cold intolerance - no constipation - no dry skin - no hair loss - no depression  She has + FH of thyroid disorders in: mother. No FH of thyroid cancer.  No h/o radiation tx to head or neck.  She has a history of hyperglycemia, but not DM or preDM: Lab Results  Component Value Date   HGBA1C 5.2 04/16/2016   HGBA1C 5.4 06/08/2015   HGBA1C 5.1 10/15/2013   She was on Metformin >> lost 80-90  lbs (heaviest: ~290 lbs) >> sugars improved >> taken off the med >> started to gain weight again.   ROS: Constitutional: see HPI  Eyes: no blurry vision, no xerophthalmia ENT: no sore throat, no nodules palpated in throat, no dysphagia/odynophagia, + Decreased hearing, + tinnitus Cardiovascular: no CP/SOB/palpitations/leg swelling Respiratory: No cough/SOB Gastrointestinal: no N/V/D/ + severe C Musculoskeletal:   No muscle/ nojoint aches Skin:  No rashes Neurological:  No tremors/ nonumbness/tingling/dizziness , + headaches  I reviewed pt's medications, allergies, PMH, social hx, family hx, and changes were documented in the history of present illness. Otherwise, unchanged from my initial visit note.  Past Medical History:  Diagnosis Date  . Acoustic neuroma ([place])   . ADD (attention deficit disorder)    and OCD-on Vyvanse  . Asthma    w/out status asthmaticus  . Awareness under anesthesia   . Benign brain tumor ([place])   . Chronic back pain   . Constipation, chronic    IBS, [facility]  . Diabetes mellitus   . Difficult airway   . 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 GERD (gastroesophageal reflux disease)   . Iron deficiency anemia, unspecified 02/18/2013  . Obesity    296 lbs (highest weight in 2012)   . Tendonitis, Achilles, left   . Vertigo    Past Surgical History:  Procedure Laterality Date  . ABLATION     and bladder sling  . ACHILLES TENDON REPAIR     x2  . ANAL FISSURE REPAIR  2012  . BACK SURGERY    . CHOLECYSTECTOMY    . IMPLANTATION BONE ANCHORED HEARING AID    . iron infusion    . MOUTH SURGERY  2015   3 implants  . OVARIAN CYST REMOVAL     x4  . THYROID LOBECTOMY Bilateral    Partial   . TOE FUSION Left    Social History   Social History Main Topics  . Smoking status: Never Smoker   . Smokeless tobacco: Never Used  . Alcohol Use: 0.5 - 1.0 oz/week    1-2 Standard drinks or equivalent per week  . Drug Use: No  . Sexual Activity:    Partners: Male    Birth Control/ Protection: Other-see comments     Comment: vasectomy   Social History Narrative   Marital Status:  Married Information systems manager)    Children:  None    Pets: Dog (01)    Living Situation: Lives with husband     Occupation:  IT - [employer]    Education:  Master's Degree    Tobacco Use/Exposure:  None    Alcohol Use:  Occasional   Drug Use:  None   Diet:  Regular   Exercise:   Kick-boxing and boot camp - 5 times per week    Hobbies: Golf, tennis and working out   Current Outpatient Prescriptions on File Prior to Visit  Medication Sig Dispense Refill  . buPROPion (WELLBUTRIN XL) 300 MG 24 hr tablet Take 1 tablet (300 mg total) by mouth daily. 30 tablet 2  . celecoxib (CELEBREX) 200 MG capsule TAKE 1 CAPSULE TWICE A DAY 180 capsule 1  . cyclobenzaprine (FLEXERIL) 10 MG tablet as needed. 30 tablet 2  . dexlansoprazole (DEXILANT) 60 MG capsule Take 60 mg by mouth daily.    [name] Kitchen docusate sodium (COLACE) 50 MG capsule Take 100 mg by mouth at bedtime.    [name] Kitchen ESTRACE VAGINAL 0.1 MG/GM vaginal cream INSERT  1 GRAM VAGINALLY TWICE A WEEK 42.5 g 2  . levothyroxine (SYNTHROID, LEVOTHROID) 25 MCG tablet TAKE 2 TABLETS (50 MCG TOTAL) BY MOUTH DAILY BEFORE BREAKFAST. 60 tablet 2  . lisdexamfetamine (VYVANSE) 60 MG capsule Take 1 capsule (60 mg total) by mouth every morning. Do not refill until 09/16/16 30 capsule 0  . triamterene-hydrochlorothiazide (MAXZIDE-25) 37.5-25 MG tablet Take 1 tablet by mouth daily. 30 tablet 5   No current facility-administered medications on file prior to visit.    Allergies  Allergen Reactions  . Mobic [Meloxicam] Nausea And Vomiting  . Multivitamins Other (See Comments)   Family History  Problem Relation Age of Onset  . Thyroid disease Mother   . Heart Problems Mother     vavle issue  . Dystonia Mother   . Hypercholesterolemia Mother   . Heart attack Father   . Cancer Father     esophageal  . Heart disease Father   . Lung cancer Paternal Grandfather   . Heart disease Paternal Grandfather   . Heart disease Maternal Grandfather   . Heart disease Paternal Grandmother   . Heart disease Other     paternal and maternal side   PE: BP (!) 132/92 (BP Location: Right Arm, Patient Position: Sitting)   Pulse 78   Ht 5' 7.5" (1.715 m)   Wt 212 lb (96.2 kg)   SpO2 98%   BMI 32.71 kg/m  Wt Readings from Last 3 Encounters:  07/23/16 212 lb (96.2  kg)  07/20/16 212 lb (96.2 kg)  06/07/16 209 lb (94.8 kg)   Constitutional: overweight, in NAD, Walks with ski stick Eyes: PERRLA, EOMI, no exophthalmos ENT: moist mucous membranes,  Thyroidectomy scar healed, no masses palpated in neck, no cervical lymphadenopathy Cardiovascular: RRR, No MRG Respiratory: CTA B Gastrointestinal: abdomen soft, NT, ND, BS+ Musculoskeletal: no deformities, strength intact in all 4 Skin: moist, warm, no rashes Neurological: + tremor with outstretched hands, DTR normal in all 4  ASSESSMENT: 1.  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 Postsurgical + Hashimoto hypothyroidism  PLAN:  1.  Follicular thyroid neoplasm - Status post right hemithyroidectomy - pt's tumor is the lowest risk across the thyroid cancer classification. Based on recent guidelines, treatment for these tumors can be de-escalated from completion thyroidectomy and radioactive iodine treatment to just monitoring after hemithyroidectomy. The chance of recurrence is less than 1% in the next 15 years.   - We will continue to follow her by thyroid function tests and thyroid ultrasounds  - will check a thyroid U/S in [place]  2.  Postsurgical + Hashimoto's hypothyroidism - Mild - last TSH was 5.16 recently (slightly high) >> will try to move Dexilant to earlier in the day so it does not interfere with LT4 - on levothyroxine 50 g daily  - We discussed about correct intake of levothyroxine if we need to start it: fasting, with water, separated by at least 30 minutes from breakfast, and separated by more than 4 hours from calcium, iron, multivitamins, acid reflux medications (PPIs).  - We will check: TSH, free T4 in 5 weeks after moving Dexilant earlier - If  Labs are abnormal, she will need to return in  5-6 weeks for repeat labs  - If these are normal, we will recheck them at next  visit, in 1 year   US SOFT TISSUE HEAD AND NECK  Order: [id number]  Status:  Final result Visible to  patient:  No (Not Released) Dx:  Thyroid adenoma  Details   Reading Physician Reading Date Result Priority  Arne Cleveland, MD 08/06/2016   Narrative    CLINICAL DATA: Thyroid adenoma. Previous right lobectomy for thyroid carcinoma.  EXAM: THYROID ULTRASOUND  TECHNIQUE: Ultrasound examination of the thyroid gland and adjacent soft tissues was performed.  COMPARISON: 02/22/2015  FINDINGS: Parenchymal Echotexture: Mildly heterogenous  Isthmus: 0.2 cm thickness, previously 0.4  Right lobe: Surgically absent  Left lobe: 4.2 x 1.4 x 1.4 cm (previously 5.6 x 1.7 x 1.5)  _________________________________________________________  Estimated total number of nodules >/= 1 cm: 0  Number of spongiform nodules >/= 2 cm not described below (TR1): 0  Number of mixed cystic and solid nodules >/= 1.5 cm not described below (TR2): 0  0.4 cm hypoechoic nodule without calcifications, mid left, in retrospect visible on prior study.  No adenopathy localized.  IMPRESSION: 1. No residual/recurrent tissue post right thyroid lobectomy. 2. Solitary small left nodule does not meet current criteria for biopsy or dedicated imaging follow-up.  The above is in keeping with the ACR TI-RADS recommendations - J Am Coll Radiol 2017;14:587-595.   Electronically Signed By: Lucrezia Europe M.D. On: 08/06/2016 11:39        Philemon Kingdom, MD PhD [facility]

## 2016-08-06 ENCOUNTER — Ambulatory Visit (INDEPENDENT_AMBULATORY_CARE_PROVIDER_SITE_OTHER): Payer: BLUE CROSS/BLUE SHIELD

## 2016-08-06 DIAGNOSIS — E041 Nontoxic single thyroid nodule: Secondary | ICD-10-CM | POA: Diagnosis not present

## 2016-08-06 DIAGNOSIS — E079 Disorder of thyroid, unspecified: Secondary | ICD-10-CM | POA: Diagnosis not present

## 2016-08-13 ENCOUNTER — Other Ambulatory Visit: Payer: Self-pay | Admitting: Internal Medicine

## 2016-08-27 ENCOUNTER — Other Ambulatory Visit: Payer: BLUE CROSS/BLUE SHIELD

## 2016-08-29 ENCOUNTER — Other Ambulatory Visit (INDEPENDENT_AMBULATORY_CARE_PROVIDER_SITE_OTHER): Payer: BLUE CROSS/BLUE SHIELD

## 2016-08-29 DIAGNOSIS — E063 Autoimmune thyroiditis: Secondary | ICD-10-CM | POA: Diagnosis not present

## 2016-08-29 LAB — TSH: TSH: 3.15 u[IU]/mL (ref 0.35–4.50)

## 2016-08-29 LAB — T4, FREE: Free T4: 1.16 ng/dL (ref 0.60–1.60)

## 2016-09-05 DIAGNOSIS — R29818 Other symptoms and signs involving the nervous system: Secondary | ICD-10-CM | POA: Diagnosis not present

## 2016-09-05 DIAGNOSIS — D333 Benign neoplasm of cranial nerves: Secondary | ICD-10-CM | POA: Diagnosis not present

## 2016-09-05 DIAGNOSIS — H9042 Sensorineural hearing loss, unilateral, left ear, with unrestricted hearing on the contralateral side: Secondary | ICD-10-CM | POA: Diagnosis not present

## 2016-09-05 DIAGNOSIS — H9192 Unspecified hearing loss, left ear: Secondary | ICD-10-CM | POA: Diagnosis not present

## 2016-09-19 ENCOUNTER — Encounter: Payer: Self-pay | Admitting: Sports Medicine

## 2016-09-19 ENCOUNTER — Ambulatory Visit (INDEPENDENT_AMBULATORY_CARE_PROVIDER_SITE_OTHER): Payer: BLUE CROSS/BLUE SHIELD | Admitting: Sports Medicine

## 2016-09-19 DIAGNOSIS — M5416 Radiculopathy, lumbar region: Secondary | ICD-10-CM

## 2016-09-19 DIAGNOSIS — M545 Low back pain, unspecified: Secondary | ICD-10-CM | POA: Insufficient documentation

## 2016-09-19 DIAGNOSIS — G8929 Other chronic pain: Secondary | ICD-10-CM | POA: Insufficient documentation

## 2016-09-19 MED ORDER — PREDNISONE 50 MG PO TABS: ORAL_TABLET | ORAL | 0 refills | Status: DC

## 2016-09-19 MED ORDER — CYCLOBENZAPRINE HCL 10 MG PO TABS: ORAL_TABLET | ORAL | 0 refills | Status: DC

## 2016-09-19 NOTE — Assessment & Plan Note (Signed)
History of an L5-S1 microdiscectomy, now with recurrence of low back pain, with left-sided radiculopathy. Axial pain is discogenic as well.  Prednisone, continue Celebrex, Flexeril at bedtime, formal physical therapy.  Return in one month, MRI with and without contrast for interventional planning if no better.

## 2016-09-19 NOTE — Progress Notes (Signed)
   Subjective:    I'm seeing this patient as a consultation for:  Iran Planas, PA-C  CC: Low back pain  HPI: This is a pleasant 56 year old female, she is recently post craniotomy for a acoustic neuroma, thyroidectomy, she has had an L5-S1 microdiscectomy in the distant past. More recently she developed acute back pain, worse with sitting, flexion, Valsalva with radiation down to the left thigh but not past the knee, she does have some numbness and tingling in her right great toe as well as middle toes. No bowel or bladder dysfunction, saddle numbness, constitutional symptoms, no trauma.  Past medical history:  Negative.  See flowsheet/record as well for more information.  Surgical history: Negative.  See flowsheet/record as well for more information.  Family history: Negative.  See flowsheet/record as well for more information.  Social history: Negative.  See flowsheet/record as well for more information.  Allergies, and medications have been entered into the medical record, reviewed, and no changes needed.   Review of Systems: No headache, visual changes, nausea, vomiting, diarrhea, constipation, dizziness, abdominal pain, skin rash, fevers, chills, night sweats, weight loss, swollen lymph nodes, body aches, joint swelling, muscle aches, chest pain, shortness of breath, mood changes, visual or auditory hallucinations.   Objective:   General: Well Developed, well nourished, and in no acute distress.  Neuro/Psych: Alert and oriented x3, extra-ocular muscles intact, able to move all 4 extremities, sensation grossly intact. Skin: Warm and dry, no rashes noted.  Respiratory: Not using accessory muscles, speaking in full sentences, trachea midline.  Cardiovascular: Pulses palpable, no extremity edema. Abdomen: Does not appear distended. Back Exam:  Inspection: Unremarkable  Motion: Flexion 45 deg, Extension 45 deg, Side Bending to 45 deg bilaterally,  Rotation to 45 deg bilaterally  SLR  laying: Negative  XSLR laying: Negative  Palpable tenderness: None. FABER: negative. Sensory change: Gross sensation intact to all lumbar and sacral dermatomes.  Reflexes: 2+ at both patellar tendons, 2+ at achilles tendons, Babinski's downgoing.  Strength at foot  Plantar-flexion: 5/5 Dorsi-flexion: 5/5 Eversion: 5/5 Inversion: 5/5  Leg strength  Quad: 5/5 Hamstring: 5/5 Hip flexor: 5/5 Hip abductors: 5/5  Gait unremarkable.  She did have an MRI in 2015 at [facility] that showed a wide disc protrusion at L4-L5 abutting both left and right L5 nerve roots, slightly more to the left. There is also degenerative changes at L5-S1 with severe bilateral foraminal narrowing left worse than right, she also has some soft tissue surrounding the left S1 nerve root consistent with postoperative fibrosis.  Impression and Recommendations:   This case required medical decision making of moderate complexity.  Left lumbar radiculopathy History of an L5-S1 microdiscectomy, now with recurrence of low back pain, with left-sided radiculopathy. Axial pain is discogenic as well.  Prednisone, continue Celebrex, Flexeril at bedtime, formal physical therapy.  Return in one month, MRI with and without contrast for interventional planning if no better.

## 2016-09-20 ENCOUNTER — Ambulatory Visit: Payer: BLUE CROSS/BLUE SHIELD | Attending: Sports Medicine | Admitting: Physical Therapy

## 2016-09-20 DIAGNOSIS — M5442 Lumbago with sciatica, left side: Secondary | ICD-10-CM | POA: Insufficient documentation

## 2016-09-20 DIAGNOSIS — M5416 Radiculopathy, lumbar region: Secondary | ICD-10-CM | POA: Diagnosis present

## 2016-09-20 DIAGNOSIS — M62830 Muscle spasm of back: Secondary | ICD-10-CM | POA: Insufficient documentation

## 2016-09-20 NOTE — Patient Instructions (Signed)

## 2016-09-20 NOTE — Therapy (Addendum)
[name] [address]  [place], [place], [address] Phone: [phone]   Fax:  [phone]  Physical Therapy Evaluation  Patient Details  Name: Cynthia [name] MRN: [id number] Date of Birth: [birth year] Referring Provider: Aundria Mems, MD  Encounter Date: 09/20/2016      PT End of Session - 09/20/16 1058    Visit Number 1   Number of Visits 12   Date for PT Re-Evaluation 11/02/16   Authorization Type BCBS    Authorization - Number of Visits 60   PT Start Time 1058   PT Stop Time 1150   PT Time Calculation (min) 52 min   Activity Tolerance Patient tolerated treatment well   Behavior During Therapy [facility] for tasks assessed/performed      Past Medical History:  Diagnosis Date  . Acoustic neuroma ([place])   . ADD (attention deficit disorder)    and OCD-on Vyvanse  . Asthma    w/out status asthmaticus  . Awareness under anesthesia   . Benign brain tumor ([place])   . Chronic back pain   . Constipation, chronic    IBS, CIC  . Diabetes mellitus   . Difficult airway   . 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 GERD (gastroesophageal reflux disease)   . Iron deficiency anemia, unspecified 02/18/2013  . Obesity    296 lbs (highest weight in 2012)   . Tendonitis, Achilles, left   . Vertigo     Past Surgical History:  Procedure Laterality Date  . ABLATION     and bladder sling  . ACHILLES TENDON REPAIR     x2  . ANAL FISSURE REPAIR  2012  . BACK SURGERY    . CHOLECYSTECTOMY    . IMPLANTATION BONE ANCHORED HEARING AID    . iron infusion    . MOUTH SURGERY  2015   3 implants  . OVARIAN CYST REMOVAL     x4  . THYROID LOBECTOMY Bilateral    Partial   . TOE FUSION Left     There were no vitals filed for this visit.       Subjective Assessment - 09/20/16 1053    Subjective Pt reports her back started hurting really bad this weekend. Not sure of trigger but reports moving clothes around  and increasing weights with trainer during workouts.    Pertinent History craniotomy for acustic neuroma tumor resection 08/2015 with sensorineural hearing loss (SNHL) of left ear, followed by vestibular rehab for balance - still has some vestibular imbalance with positional changes ; remote h/o L5-S1 microdiscectomy in 2000   Limitations Sitting   How long can you sit comfortably? 10-15 minutes   How long can you stand comfortably? 2.5 hrs   Diagnostic tests MRI in 2015 at [facility] that showed a wide disc protrusion at L4-L5 abutting both left and right L5 nerve roots, slightly more to the left. There is also degenerative changes at L5-S1 with severe bilateral foraminal narrowing left worse than right, she also has some soft tissue surrounding the left S1 nerve root consistent with postoperative fibrosis.   Patient Stated Goals "to make me pain-free and get me mobile so I can work out again (not with trainer); avoid surgery"   Currently in Pain? Yes   Pain Score 6   least 4/10, avg 8/10, worst 10/10   Pain Location Back   Pain Orientation Lower;Left   Pain Descriptors / Indicators  Sharp;Stabbing   Pain Type Acute pain   Pain Radiating Towards L anterior hip; soresness in L thigh   Pain Onset In the past 7 days   Pain Frequency Constant   Aggravating Factors  transition for sitting to standing, bending over   Pain Relieving Factors lying down, steroids +/- muscle relaxant   Effect of Pain on Daily Activities bathing & dressing lower body, poor tolerance for household chores            [facility] PT Assessment - 09/20/16 1058      Assessment   Medical Diagnosis L lumbar radiculopathy   Referring Provider Aundria Mems, MD   Onset Date/Surgical Date 09/15/16   Next MD Visit 10/17/16   Prior Therapy vestibular rehab 08/2015-09/2015     Balance Screen   Has the patient fallen in the past 6 months No   Has the patient had a decrease in activity level because of a fear of  falling?  No   Is the patient reluctant to leave their home because of a fear of falling?  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   Leisure kick-boxing, hiking, golf, work-out 4-5x/wk     Observation/Other Assessments   Focus on Therapeutic Outcomes (FOTO)  Lumbar spine - 28% (72% limitation); predicted 58% (42% limitation)     ROM / Strength   AROM / PROM / Strength AROM;Strength     AROM   AROM Assessment Site Lumbar   Lumbar Flexion hands to knees - increased pain   Lumbar Extension <25% - increased pain   Lumbar - Right Side Bend hand to lateral knee - increased pain   Lumbar - Left Side Bend hand ot lateral knee - no pain   Lumbar - Right Rotation WNL - no pain   Lumbar - Left Rotation WNL - no pain     Strength   Strength Assessment Site Hip;Knee   Right/Left Hip Right;Left   Right Hip Flexion 4/5   Right Hip Extension 4/5   Right Hip External Rotation  4/5   Right Hip Internal Rotation 4/5   Right Hip ABduction 4/5   Right Hip ADduction 4-/5   Left Hip Flexion 4/5   Left Hip Extension 4-/5   Left Hip External Rotation 4-/5   Left Hip Internal Rotation 4-/5   Left Hip ABduction 4-/5   Left Hip ADduction 3+/5   Right/Left Knee Right;Left   Right Knee Flexion 4+/5   Right Knee Extension 4+/5   Left Knee Flexion 4+/5   Left Knee Extension 4+/5     Flexibility   Soft Tissue Assessment /Muscle Length yes   Hamstrings moderately tight B   Quadriceps mild to mod tight in hip flexors & quads   ITB mildly tight B   Piriformis WFL   Quadratus Lumborum mildly tight B     Palpation   Palpation comment ttp over B paraspinals (L>R) & QL (R>L), L SIJ, L piriformis & L ITB with particular tenderness at L  trochanteric bursa     Special Tests    Special Tests Lumbar   Lumbar Tests FABER test;Straight Leg Raise     FABER test  findings Negative     Straight Leg Raise   Findings Positive   Side  Left                   [facility] Adult PT Treatment/Exercise - 09/20/16 1058      Exercises   Exercises Lumbar     Lumbar Exercises: Stretches   Passive Hamstring Stretch 30 seconds;2 reps   Passive Hamstring Stretch Limitations supine with strap (all stretches performed on L only for purposes of demonstration)   Single Knee to Chest Stretch Limitations reviewed but not attempted (pt reports already doing this at home)   Hip Flexor Stretch 30 seconds;2 reps   Hip Flexor Stretch Limitations mod thomas with strap    Prone on Elbows Stretch Limitations reviewed but not attempted (pt reports already doing this at home)   ITB Stretch 30 seconds;2 reps   ITB Stretch Limitations supine with strap      Lumbar Exercises: Supine   Ab Set 10 reps;5 seconds   Clam Limitations attempted with red TB, but deferred d/t increased pain on L    Ionto Patch (#1 of 6) to L greater trochanter - Dexamethasone 1 mL, 80 mA-min, 4-6 hr wear time             PT Education - 09/20/16 1159    Education provided Yes   Education Details PT eval findings, POC, initial HEP, ionto precautions & contraindications, role of kinesiotaping   Person(s) Educated Patient   Methods Explanation;Demonstration;Handout   Comprehension Verbalized understanding;Returned demonstration;Need further instruction             PT Long Term Goals - 09/20/16 1150      PT LONG TERM GOAL #1   Title Independent with HEP by 11/02/16   Status New     PT LONG TERM GOAL #2   Title Pt will verbalize/demonstrate good awareness of neutral spine posture and proper body mechanics for daily tasks by 11/02/16   Status New     PT LONG TERM GOAL #3   Title Lumbar ROM WFL w/o restriction d/t pain by 11/02/16   Status New     PT  LONG TERM GOAL #4   Title B hip strength >/= 4+/5 w/o increased pain by 11/02/16   Status New     PT LONG TERM GOAL #5   Title Pt will report ability to perform ADLs and daily household chores w/o limitation due to LBP by 11/02/16   Status New               Plan - 09/20/16 1150    Clinical Impression Statement Cristabel is a 56 y/o female who presents to PT with acute onset of L sided LBP on 09/15/16 with unknown triggering event. Saw MD yesterday and was started on steroids which pt believes is helping, but still averaging 8/10 LBP. Assessment revealed significantly limited lumbar flexion & extension with increased pain in both directions, with remaining motions WFL but increased pain noted with R side-bending and rotation. Flexibility limited bilaterally in hamstrings, ITB & hip flexors/quads with increased muscle tension and ttp also noted in B QL, L piriformis & ITB with significant point tenderness over L greater trochanter, with poor tolerance for L sidelying. MMT reveals mild weakness in proximal LE musculature L>R. Pain and limited flexibility limit lower body bathing and dressing as well as tolerance for household chores. Pt will benefit from skilled PT to focus on postural and body mechanics education to reduce  lumbar strain with daily activities, increasing LE flexibility, core/lumbar stabilization and LE strengthening, along with manual therapy and modalities PRN for pain management. May consider mechanical traction if symptoms persist and/or dry needling for pain/increased muscle tension.   Rehab Potential Good   PT Frequency 2x / week   PT Duration 6 weeks   PT Treatment/Interventions Patient/family education;Neuromuscular re-education;Therapeutic exercise;Therapeutic activities;Functional mobility training;Manual techniques;Taping;Dry needling;Iontophoresis 4mg /ml Dexamethasone;Electrical Stimulation;Moist Heat;Cryotherapy;Ultrasound;Traction;ADLs/Self Care Home Management   PT Next  Visit Plan Assess response to taping & ionto patch; Review initial HEP; Manual therapy for increased muscle tension/pain; Lumbar stabilization with progression of proximal strengthening as tol; Modalities including ionto or estim PRN for pain   Consulted and Agree with Plan of Care Patient      Patient will benefit from skilled therapeutic intervention in order to improve the following deficits and impairments:  Pain, Increased muscle spasms, Impaired flexibility, Decreased range of motion, Decreased strength, Decreased activity tolerance, Postural dysfunction, Improper body mechanics  Visit Diagnosis: Acute right-sided low back pain with left-sided sciatica - Plan: PT plan of care cert/re-cert  Muscle spasm of back - Plan: PT plan of care cert/re-cert  Radiculopathy, lumbar region - Plan: PT plan of care cert/re-cert     Problem List Patient Active Problem List   Diagnosis Date Noted  . Left lumbar radiculopathy 09/19/2016  . Costochondritis 03/21/2016  . 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 Left ear hearing loss 10/14/2015  . Subconjunctival hemorrhage of right eye 06/28/2015  . Hashimoto's thyroiditis 06/14/2015  . Thyroid adenoma 05/04/2015  . 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 Hypokalemia 02/13/2015  . 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 Acoustic neuroma ([place]) 12/10/2014  . 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 ADD (attention deficit disorder) 05/11/2014  . Low back pain 05/11/2014  . Chronic constipation 05/11/2014  . Essential (primary) hypertension 05/11/2014  . Gastro-esophageal reflux disease without esophagitis 05/11/2014  . Asthma, mild persistent 05/11/2014  . IFG (impaired fasting glucose) 09/13/2013  . Wheezing 09/13/2013  . Swelling of limb 09/13/2013  . Obesity, unspecified 09/13/2013  . Interdigital neuralgia 04/14/2013  . Iron deficiency anemia, unspecified 02/18/2013    Percival Spanish, PT,  MPT 09/21/2016, 10:59 AM  [facility] [address]  [place], [place], [address] Phone: [phone]   Fax:  [phone]  Name: Sullivan Blasing MRN: [id number] Date of Birth: [birth year]

## 2016-09-24 ENCOUNTER — Ambulatory Visit: Payer: BLUE CROSS/BLUE SHIELD

## 2016-09-24 ENCOUNTER — Ambulatory Visit: Payer: BLUE CROSS/BLUE SHIELD | Admitting: Physical Therapy

## 2016-09-24 DIAGNOSIS — M5442 Lumbago with sciatica, left side: Secondary | ICD-10-CM

## 2016-09-24 DIAGNOSIS — M5416 Radiculopathy, lumbar region: Secondary | ICD-10-CM | POA: Diagnosis not present

## 2016-09-24 DIAGNOSIS — M62830 Muscle spasm of back: Secondary | ICD-10-CM | POA: Diagnosis not present

## 2016-09-24 NOTE — Therapy (Signed)
[facility] [address]  [place], [place], [address] Phone: [phone]   Fax:  [phone]  Physical Therapy Treatment  Patient Details  Name: Cynthia [name] MRN: [id number] Date of Birth: 07/04/1960 Referring Provider: Aundria Mems, MD  Encounter Date: 09/24/2016      PT End of Session - 09/24/16 0804    Visit Number 2   Number of Visits 12   Date for PT Re-Evaluation 11/02/16   Authorization Type BCBS    Authorization - Number of Visits 60   PT Start Time 0759   PT Stop Time 0853   PT Time Calculation (min) 54 min   Activity Tolerance Patient tolerated treatment well   Behavior During Therapy [facility] for tasks assessed/performed      Past Medical History:  Diagnosis Date  . Acoustic neuroma ([place])   . ADD (attention deficit disorder)    and OCD-on Vyvanse  . Asthma    w/out status asthmaticus  . Awareness under anesthesia   . Benign brain tumor ([place])   . Chronic back pain   . Constipation, chronic    IBS, CIC  . Diabetes mellitus   . Difficult airway   . 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 GERD (gastroesophageal reflux disease)   . Iron deficiency anemia, unspecified 02/18/2013  . Obesity    296 lbs (highest weight in 2012)   . Tendonitis, Achilles, left   . Vertigo     Past Surgical History:  Procedure Laterality Date  . ABLATION     and bladder sling  . ACHILLES TENDON REPAIR     x2  . ANAL FISSURE REPAIR  2012  . BACK SURGERY    . CHOLECYSTECTOMY    . IMPLANTATION BONE ANCHORED HEARING AID    . iron infusion    . MOUTH SURGERY  2015   3 implants  . OVARIAN CYST REMOVAL     x4  . THYROID LOBECTOMY Bilateral    Partial   . TOE FUSION Left     There were no vitals filed for this visit.      Subjective Assessment - 09/24/16 0801    Subjective Pt. reporting she walking 1/2 mile x 2 over weekend without significant pain increase.  pt. noting benefit from taping  last treatment.    Patient Stated Goals "to make me pain-free and get me mobile so I can work out again (not with trainer); avoid surgery"   Currently in Pain? Yes   Pain Score 5    Pain Location Back   Pain Orientation Lower;Left   Pain Descriptors / Indicators Sharp;Stabbing   Pain Type Acute pain   Multiple Pain Sites No                         [facility] Adult PT Treatment/Exercise - 09/24/16 0811      Lumbar Exercises: Stretches   Passive Hamstring Stretch 30 seconds;2 reps   Passive Hamstring Stretch Limitations B with therapist; cues for muscular relaxation   Single Knee to Chest Stretch Limitations B with therapist    Hip Flexor Stretch 30 seconds;2 reps   Hip Flexor Stretch Limitations L side only for HEP demo; mod thomas with strap; cues for abdominal brace    Prone on Elbows Stretch 1 rep;30 seconds   ITB Stretch 30 seconds;2 reps   ITB Stretch Limitations supine with strap  Lumbar Exercises: Aerobic   Stationary Bike NuStep: level 3, 6 min      Lumbar Exercises: Standing   Functional Squats 10 reps;3 seconds   Functional Squats Limitations on TRX; cues for abdom bracing and breathing     Lumbar Exercises: Seated   Long Arc Quad on [place] --   LAQ on [place] Limitations --   Hip Flexion on Ball AROM;10 reps;Right;Left   Hip Flexion on Ball Limitations alternating seated on green P-ball (65cm)      Lumbar Exercises: Supine   Ab Set 10 reps;5 seconds   AB Set Limitations with red TB around knees with susteined hip abduction   Clam 10 reps;3 seconds   Clam Limitations abd brace with alternating hip abd with red TB   Bridge 10 reps;3 seconds   Bridge Limitations with sustained hip abd/ER with red TB   cues for abdominal bracing      Lumbar Exercises: Sidelying   Clam 10 reps;3 seconds   Clam Limitations with red      Lumbar Exercises: Quadruped   [place]/Old Horse 10 reps   [place]/Old Horse Limitations quadruped on peanut p-ball 3" x 10 reps     Straight Leg Raise 10 reps;3 seconds   Straight Leg Raises Limitations on peanut p-ball 3" x 10 reps each way      Iontophoresis   Type of Iontophoresis Dexamethasone   Location L greater trochanter   Dose 1.0 mL, 80 mA-min   Time 4-6 hr patch  patch 2/6     Manual Therapy   Manual Therapy Taping   Manual therapy comments 2 "I strips" along lumbar paraspinals from S1 to mid thoracic (30% stretch)                     PT Long Term Goals - 09/24/16 0907      PT LONG TERM GOAL #1   Title Independent with HEP by 11/02/16   Status On-going     PT LONG TERM GOAL #2   Title Pt will verbalize/demonstrate good awareness of neutral spine posture and proper body mechanics for daily tasks by 11/02/16   Status On-going     PT LONG TERM GOAL #3   Title Lumbar ROM WFL w/o restriction d/t pain by 11/02/16   Status On-going     PT LONG TERM GOAL #4   Title B hip strength >/= 4+/5 w/o increased pain by 11/02/16   Status On-going     PT LONG TERM GOAL #5   Title Pt will report ability to perform ADLs and daily household chores w/o limitation due to LBP by 11/02/16   Status On-going               Plan - 09/24/16 0804    Clinical Impression Statement Pt. reporting less pain over weekend and benefit from ionto patch and taping to lumbar spine.  Pt. was able to walk 1/2 mile x 2 over weekend without significant pain increase.  Pt. reporting consistent HEP performance without issue over weekend.  HEP review today to check for proper technique with pt. able to demo all activities well.  Lumbopelvic strengthening activity progressed today to include seated march on p-ball, quadruped SLR, and TRX squat.  Cues for abdominal bracing provided with all activities.  Taping and ionto patch 2#/6 applied today to end treatment.  Pt. ending treatment with decreased pain from initial report.  Pt. very compliant with therapy and seems motivated.   PT Treatment/Interventions  Patient/family  education;Neuromuscular re-education;Therapeutic exercise;Therapeutic activities;Functional mobility training;Manual techniques;Taping;Dry needling;Iontophoresis 4mg /ml Dexamethasone;Electrical Stimulation;Moist Heat;Cryotherapy;Ultrasound;Traction;ADLs/Self Care Home Management   PT Next Visit Plan Assess response to taping & ionto patch; Manual therapy for increased muscle tension/pain; Lumbar stabilization with progression of proximal strengthening as tol; Modalities including ionto or estim PRN for pain      Patient will benefit from skilled therapeutic intervention in order to improve the following deficits and impairments:  Pain, Increased muscle spasms, Impaired flexibility, Decreased range of motion, Decreased strength, Decreased activity tolerance, Postural dysfunction, Improper body mechanics  Visit Diagnosis: Acute right-sided low back pain with left-sided sciatica  Muscle spasm of back  Radiculopathy, lumbar region     Problem List Patient Active Problem List   Diagnosis Date Noted  . Left lumbar radiculopathy 09/19/2016  . Costochondritis 03/21/2016  . 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 Left ear hearing loss 10/14/2015  . Subconjunctival hemorrhage of right eye 06/28/2015  . Hashimoto's thyroiditis 06/14/2015  . Thyroid adenoma 05/04/2015  . 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 Hypokalemia 02/13/2015  . 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 Acoustic neuroma ([place]) 12/10/2014  . 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 ADD (attention deficit disorder) 05/11/2014  . Low back pain 05/11/2014  . Chronic constipation 05/11/2014  . Essential (primary) hypertension 05/11/2014  . Gastro-esophageal reflux disease without esophagitis 05/11/2014  . Asthma, mild persistent 05/11/2014  . IFG (impaired fasting glucose) 09/13/2013  . Wheezing 09/13/2013  . Swelling of limb 09/13/2013  . Obesity, unspecified 09/13/2013  . Interdigital  neuralgia 04/14/2013  . Iron deficiency anemia, unspecified 02/18/2013    Bess Harvest, PTA 09/24/16 9:15 AM  [facility] [address]  [place], [place], [address] Phone: [phone]   Fax:  [phone]  Name: Cynthia [name] MRN: [id number] Date of Birth: 11/26/1960

## 2016-09-27 ENCOUNTER — Ambulatory Visit: Payer: BLUE CROSS/BLUE SHIELD

## 2016-09-27 DIAGNOSIS — M5442 Lumbago with sciatica, left side: Secondary | ICD-10-CM

## 2016-09-27 DIAGNOSIS — M5416 Radiculopathy, lumbar region: Secondary | ICD-10-CM | POA: Diagnosis not present

## 2016-09-27 DIAGNOSIS — M62830 Muscle spasm of back: Secondary | ICD-10-CM | POA: Diagnosis not present

## 2016-09-27 NOTE — Therapy (Signed)
[facility] [address]  [place], [place], [address] Phone: [phone]   Fax:  [phone]  Physical Therapy Treatment  Patient Details  Name: Brittley Regner MRN: [id number] Date of Birth: 03/10/1961 Referring Provider: Aundria Mems, MD  Encounter Date: 09/27/2016      PT End of Session - 09/27/16 1157    Visit Number 3   Number of Visits 12   Date for PT Re-Evaluation 11/02/16   Authorization Type BCBS    Authorization - Number of Visits 60   PT Start Time 1101   PT Stop Time 1155   PT Time Calculation (min) 54 min   Activity Tolerance Patient tolerated treatment well   Behavior During Therapy [facility] for tasks assessed/performed      Past Medical History:  Diagnosis Date  . Acoustic neuroma ([place])   . ADD (attention deficit disorder)    and OCD-on Vyvanse  . Asthma    w/out status asthmaticus  . Awareness under anesthesia   . Benign brain tumor ([place])   . Chronic back pain   . Constipation, chronic    IBS, CIC  . Diabetes mellitus   . Difficult airway   . 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 GERD (gastroesophageal reflux disease)   . Iron deficiency anemia, unspecified 02/18/2013  . Obesity    296 lbs (highest weight in 2012)   . Tendonitis, Achilles, left   . Vertigo     Past Surgical History:  Procedure Laterality Date  . ABLATION     and bladder sling  . ACHILLES TENDON REPAIR     x2  . ANAL FISSURE REPAIR  2012  . BACK SURGERY    . CHOLECYSTECTOMY    . IMPLANTATION BONE ANCHORED HEARING AID    . iron infusion    . MOUTH SURGERY  2015   3 implants  . OVARIAN CYST REMOVAL     x4  . THYROID LOBECTOMY Bilateral    Partial   . TOE FUSION Left     There were no vitals filed for this visit.      Subjective Assessment - 09/27/16 1104    Subjective Pt. reporting able to walk 2 miles the last three days since last treatment and is doing the HEP stretches 2x/day.  Pt.  feeling like the patch has helped and she is now not hurting over L hip area.  Pt. feeling some pain over, "front of L hip".     Patient Stated Goals "to make me pain-free and get me mobile so I can work out again (not with trainer); avoid surgery"   Currently in Pain? Yes   Pain Score 2    Pain Location Back   Pain Orientation Left;Lower   Multiple Pain Sites Yes   Pain Score 3   Pain Location Hip   Pain Orientation Left;Anterior   Pain Descriptors / Indicators Aching                         [facility] Adult PT Treatment/Exercise - 09/27/16 1135      Lumbar Exercises: Stretches   Piriformis Stretch 2 reps;30 seconds  Figure-4, mod piriformis     Lumbar Exercises: Aerobic   Stationary Bike Recumbent bike: lvl 2, 6 min      Lumbar Exercises: Standing   Functional Squats 3 seconds;15 reps   Functional Squats Limitations on TRX; cues for  abdom bracing and breathing   Forward Lunge 10 reps;3 seconds  2 sets; 2nd set on BOSU ball (up)    Forward Lunge Limitations B ski poles      Lumbar Exercises: Supine   Bridge 3 seconds;15 reps     Lumbar Exercises: Quadruped   Madcat/Old Horse 10 reps   Madcat/Old Horse Limitations quadruped on peanut p-ball 3" x 10 reps    Opposite Arm/Leg Raise Right arm/Left leg;Left arm/Right leg;10 reps;3 seconds     Knee/Hip Exercises: Seated   Other Seated Knee/Hip Exercises B unilateral row with double green TB x 15 rep; seated on green P-ball (65cm)    Other Seated Knee/Hip Exercises B pallof press with blue TB seated on green p-ball (65cm) x 15 reps   Marching AROM;Right;Left;1 set;15 reps   Marching Limitations Seated on green P-ball (65cm)      Manual Therapy   Manual Therapy Taping;Soft tissue mobilization;Myofascial release   Manual therapy comments 2 "I strips" along lumbar paraspinals from S1 to mid thoracic (30% stretch); cross strip (30% stetch at S1 area)   Soft tissue mobilization STM to R glute near midline (TTP here) with  red med ball (1000Gr); STM/strumming to L hip flexor/quad in mod thomas position    Myofascial Release TPR to L mid quad in mod thomas position; very TTP here                     PT Long Term Goals - 09/24/16 0907      PT LONG TERM GOAL #1   Title Independent with HEP by 11/02/16   Status On-going     PT LONG TERM GOAL #2   Title Pt will verbalize/demonstrate good awareness of neutral spine posture and proper body mechanics for daily tasks by 11/02/16   Status On-going     PT LONG TERM GOAL #3   Title Lumbar ROM WFL w/o restriction d/t pain by 11/02/16   Status On-going     PT LONG TERM GOAL #4   Title B hip strength >/= 4+/5 w/o increased pain by 11/02/16   Status On-going     PT LONG TERM GOAL #5   Title Pt will report ability to perform ADLs and daily household chores w/o limitation due to LBP by 11/02/16   Status On-going               Plan - 09/27/16 1210    Clinical Impression Statement Pt. reporting she was able to walk 2 miles the last 3 days in a row without hip or excessive back pain.  Pt. no longer TTP over L greater trochanteric area today thus ionto patch not applied.  Pt. noting good benefit from taping to lower/mid back thus this reapplied today.  Lumbopelvic strengthening activity progressed to stability seated on green P-ball and shallow squatting/lunging with core bracing.  Pt. tolerating therex well without pain increase.  Pt. with plans to leave for flight to [place] for 4 day vacation on 4.18.  Pt. reporting 2x/day performance of HEP stretches at this point.  Pt. remains very motivated with therapy and compliant with HEP.   PT Treatment/Interventions Patient/family education;Neuromuscular re-education;Therapeutic exercise;Therapeutic activities;Functional mobility training;Manual techniques;Taping;Dry needling;Iontophoresis 4mg /ml Dexamethasone;Electrical Stimulation;Moist Heat;Cryotherapy;Ultrasound;Traction;ADLs/Self Care Home Management   PT Next  Visit Plan Assess response to taping; Manual therapy for increased muscle tension/pain; Lumbar stabilization with progression of proximal strengthening as tol; Modalities including ionto or estim PRN for pain      Patient will benefit  from skilled therapeutic intervention in order to improve the following deficits and impairments:  Pain, Increased muscle spasms, Impaired flexibility, Decreased range of motion, Decreased strength, Decreased activity tolerance, Postural dysfunction, Improper body mechanics  Visit Diagnosis: Acute right-sided low back pain with left-sided sciatica  Muscle spasm of back  Radiculopathy, lumbar region     Problem List Patient Active Problem List   Diagnosis Date Noted  . Left lumbar radiculopathy 09/19/2016  . Costochondritis 03/21/2016  . 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 Left ear hearing loss 10/14/2015  . Subconjunctival hemorrhage of right eye 06/28/2015  . Hashimoto's thyroiditis 06/14/2015  . Thyroid adenoma 05/04/2015  . 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 Hypokalemia 02/13/2015  . 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 Acoustic neuroma ([place]) 12/10/2014  . 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 ADD (attention deficit disorder) 05/11/2014  . Low back pain 05/11/2014  . Chronic constipation 05/11/2014  . Essential (primary) hypertension 05/11/2014  . Gastro-esophageal reflux disease without esophagitis 05/11/2014  . Asthma, mild persistent 05/11/2014  . IFG (impaired fasting glucose) 09/13/2013  . Wheezing 09/13/2013  . Swelling of limb 09/13/2013  . Obesity, unspecified 09/13/2013  . Interdigital neuralgia 04/14/2013  . Iron deficiency anemia, unspecified 02/18/2013    Bess Harvest, PTA 09/27/16 12:20 PM  [facility] [address]  [place], [place], [address] Phone: [phone]   Fax:  [phone]  Name: Kayna Suppa MRN:  [id number] Date of Birth: [birth year]

## 2016-09-30 ENCOUNTER — Other Ambulatory Visit: Payer: Self-pay | Admitting: Physician Assistant

## 2016-09-30 DIAGNOSIS — I10 Essential (primary) hypertension: Secondary | ICD-10-CM

## 2016-10-01 ENCOUNTER — Ambulatory Visit: Payer: BLUE CROSS/BLUE SHIELD | Admitting: Physical Therapy

## 2016-10-01 DIAGNOSIS — M5442 Lumbago with sciatica, left side: Secondary | ICD-10-CM | POA: Diagnosis not present

## 2016-10-01 DIAGNOSIS — M62830 Muscle spasm of back: Secondary | ICD-10-CM

## 2016-10-01 DIAGNOSIS — M5416 Radiculopathy, lumbar region: Secondary | ICD-10-CM

## 2016-10-01 NOTE — Therapy (Signed)
[facility] [address]  [place], [place], [address] Phone: [phone]   Fax:  [phone]  Physical Therapy Treatment  Patient Details  Name: Cynthia [name] MRN: [id number] Date of Birth: [birth year] Referring Provider: Aundria Mems, MD  Encounter Date: 10/01/2016      PT End of Session - 10/01/16 0800    Visit Number 4   Number of Visits 12   Date for PT Re-Evaluation 11/02/16   Authorization Type BCBS    Authorization - Number of Visits 60   PT Start Time 0800   PT Stop Time 0846   PT Time Calculation (min) 46 min   Activity Tolerance Patient tolerated treatment well   Behavior During Therapy [facility] for tasks assessed/performed      Past Medical History:  Diagnosis Date  . Acoustic neuroma ([place])   . ADD (attention deficit disorder)    and OCD-on Vyvanse  . Asthma    w/out status asthmaticus  . Awareness under anesthesia   . Benign brain tumor ([place])   . Chronic back pain   . Constipation, chronic    IBS, CIC  . Diabetes mellitus   . Difficult airway   . 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 GERD (gastroesophageal reflux disease)   . Iron deficiency anemia, unspecified 02/18/2013  . Obesity    296 lbs (highest weight in 2012)   . Tendonitis, Achilles, left   . Vertigo     Past Surgical History:  Procedure Laterality Date  . ABLATION     and bladder sling  . ACHILLES TENDON REPAIR     x2  . ANAL FISSURE REPAIR  2012  . BACK SURGERY    . CHOLECYSTECTOMY    . IMPLANTATION BONE ANCHORED HEARING AID    . iron infusion    . MOUTH SURGERY  2015   3 implants  . OVARIAN CYST REMOVAL     x4  . THYROID LOBECTOMY Bilateral    Partial   . TOE FUSION Left     There were no vitals filed for this visit.      Subjective Assessment - 10/01/16 0805    Subjective Pt noting pain up to 4/10 yesterday but subsided on its own.   Pertinent History craniotomy for acustic neuroma tumor  resection 08/2015 with sensorineural hearing loss (SNHL) of left ear, followed by vestibular rehab for balance - still has some vestibular imbalance with positional changes ; remote h/o L5-S1 microdiscectomy in 2000   Patient Stated Goals "to make me pain-free and get me mobile so I can work out again (not with trainer); avoid surgery"   Currently in Pain? Yes   Pain Score 1    Pain Location Back   Pain Orientation Lower;Right   Pain Descriptors / Indicators Dull   Pain Type Acute pain                         [facility] Adult PT Treatment/Exercise - 10/01/16 0800      Lumbar Exercises: Stretches   Piriformis Stretch 30 seconds;2 reps   Piriformis Stretch Limitations figure 4 (single leg)     Lumbar Exercises: Aerobic   Stationary Bike Rec bike -  lvl 2 x 6 min      Lumbar Exercises: Supine   Clam 15 reps;3 seconds   Clam Limitations TrA + hooklying alt hip ABD/ER   Bent Knee Raise  15 reps;3 seconds   Bent Knee Raise Limitations alt LE October 01, 2022   Dead Bug 3 seconds  12 reps   Bridge Limitations deferred d/t c/o increased LBP     Lumbar Exercises: Sidelying   Clam 15 reps;3 seconds   Clam Limitations red TB     Modalities   Modalities Iontophoresis     Iontophoresis   Type of Iontophoresis Dexamethasone   Location R greater trochanter   Dose 1.0 mL, 80 mA-min   Time 4-6 hr patch  patch 1/6     Manual Therapy   Manual Therapy Soft tissue mobilization;Myofascial release;Taping   Manual therapy comments pt in supine & R sidelying   Soft tissue mobilization L proximal quad & hip flexors   Myofascial Release L iliopsoas; L ITB with foam roller   Kinesiotex Inhibit Muscle     Kinesiotix   Inhibit Muscle  L ITB - 30% from ITB insertion to just distal to greater trochanter + 50% perpendicular strip at area of greatest tenderness                PT Education - 10/01/16 0847    Education provided Yes   Education Details Lumbar strengthening/stabilization HEP    Person(s) Educated Patient   Methods Explanation;Demonstration;Handout   Comprehension Verbalized understanding;Returned demonstration;Need further instruction             PT Long Term Goals - 09/24/16 0907      PT LONG TERM GOAL #1   Title Independent with HEP by 11/02/16   Status On-going     PT LONG TERM GOAL #2   Title Pt will verbalize/demonstrate good awareness of neutral spine posture and proper body mechanics for daily tasks by 11/02/16   Status On-going     PT LONG TERM GOAL #3   Title Lumbar ROM WFL w/o restriction d/t pain by 11/02/16   Status On-going     PT LONG TERM GOAL #4   Title B hip strength >/= 4+/5 w/o increased pain by 11/02/16   Status On-going     PT LONG TERM GOAL #5   Title Pt will report ability to perform ADLs and daily household chores w/o limitation due to LBP by 11/02/16   Status On-going               Plan - 10/01/16 0806    Clinical Impression Statement Pt reporting low back pain is improving with remaining pain mostly localized to L anterior hip/groin and only present intermittently. Pt ttp over iliopsoas and ITB, therefore targeted STM/MFR to these areas followed by taping for ITB. While in R sidelying during manual therapy, pt noting pain over R greater trochanter similar to pain originally present on L. Given positive response to ionto patch to L greater trochanter, patch applied to R greater trochanter. Given pt preparing to leave for vacation later this week, added a few lumbar stabilization/strengthening exercises to HEP.   PT Treatment/Interventions Patient/family education;Neuromuscular re-education;Therapeutic exercise;Therapeutic activities;Functional mobility training;Manual techniques;Taping;Dry needling;Iontophoresis 4mg /ml Dexamethasone;Electrical Stimulation;Moist Heat;Cryotherapy;Ultrasound;Traction;ADLs/Self Care Home Management   PT Next Visit Plan Manual therapy for increased muscle tension/pain; Lumbar stabilization with  progression of proximal strengthening as tol; Modalities including ionto or estim PRN for pain   Consulted and Agree with Plan of Care Patient      Patient will benefit from skilled therapeutic intervention in order to improve the following deficits and impairments:  Pain, Increased muscle spasms, Impaired flexibility, Decreased range of motion, Decreased strength, Decreased activity tolerance, Postural  dysfunction, Improper body mechanics  Visit Diagnosis: Acute right-sided low back pain with left-sided sciatica  Muscle spasm of back  Radiculopathy, lumbar region     Problem List Patient Active Problem List   Diagnosis Date Noted  . Left lumbar radiculopathy 09/19/2016  . Costochondritis 03/21/2016  . 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 Left ear hearing loss 10/14/2015  . Subconjunctival hemorrhage of right eye 06/28/2015  . Hashimoto's thyroiditis 06/14/2015  . Thyroid adenoma 05/04/2015  . 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 Hypokalemia 02/13/2015  . 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 Acoustic neuroma ([place]) 12/10/2014  . 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 ADD (attention deficit disorder) 05/11/2014  . Low back pain 05/11/2014  . Chronic constipation 05/11/2014  . Essential (primary) hypertension 05/11/2014  . Gastro-esophageal reflux disease without esophagitis 05/11/2014  . Asthma, mild persistent 05/11/2014  . IFG (impaired fasting glucose) 09/13/2013  . Wheezing 09/13/2013  . Swelling of limb 09/13/2013  . Obesity, unspecified 09/13/2013  . Interdigital neuralgia 04/14/2013  . Iron deficiency anemia, unspecified 02/18/2013    Percival Spanish, PT, MPT 10/01/2016, 11:02 AM  [facility] [address]  [place], [place], [address] Phone: [phone]   Fax:  [phone]  Name: Cynthia [name] MRN: [id number] Date of Birth: August 04, 1960

## 2016-10-03 ENCOUNTER — Ambulatory Visit: Payer: BLUE CROSS/BLUE SHIELD

## 2016-10-07 ENCOUNTER — Other Ambulatory Visit: Payer: Self-pay | Admitting: Physician Assistant

## 2016-10-07 DIAGNOSIS — R454 Irritability and anger: Secondary | ICD-10-CM

## 2016-10-08 ENCOUNTER — Ambulatory Visit: Payer: BLUE CROSS/BLUE SHIELD

## 2016-10-08 DIAGNOSIS — M5416 Radiculopathy, lumbar region: Secondary | ICD-10-CM

## 2016-10-08 DIAGNOSIS — M62830 Muscle spasm of back: Secondary | ICD-10-CM

## 2016-10-08 DIAGNOSIS — M5442 Lumbago with sciatica, left side: Secondary | ICD-10-CM

## 2016-10-08 NOTE — Therapy (Signed)
[facility] [address]  [place], [place], [address] Phone: [phone]   Fax:  [phone]  Physical Therapy Treatment  Patient Details  Name: Cynthia [name] MRN: [id number] Date of Birth: July 16, 1960 Referring Provider: Aundria Mems, MD  Encounter Date: 10/08/2016      PT End of Session - 10/08/16 0804    Visit Number 5   Number of Visits 12   Date for PT Re-Evaluation 11/02/16   Authorization Type BCBS    Authorization - Number of Visits 60   PT Start Time 0759   PT Stop Time 0843   PT Time Calculation (min) 44 min   Activity Tolerance Patient tolerated treatment well   Behavior During Therapy [facility] for tasks assessed/performed      Past Medical History:  Diagnosis Date  . Acoustic neuroma ([place])   . ADD (attention deficit disorder)    and OCD-on Vyvanse  . Asthma    w/out status asthmaticus  . Awareness under anesthesia   . Benign brain tumor ([place])   . Chronic back pain   . Constipation, chronic    IBS, CIC  . Diabetes mellitus   . Difficult airway   . 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 GERD (gastroesophageal reflux disease)   . Iron deficiency anemia, unspecified 02/18/2013  . Obesity    296 lbs (highest weight in 2012)   . Tendonitis, Achilles, left   . Vertigo     Past Surgical History:  Procedure Laterality Date  . ABLATION     and bladder sling  . ACHILLES TENDON REPAIR     x2  . ANAL FISSURE REPAIR  2012  . BACK SURGERY    . CHOLECYSTECTOMY    . IMPLANTATION BONE ANCHORED HEARING AID    . iron infusion    . MOUTH SURGERY  2015   3 implants  . OVARIAN CYST REMOVAL     x4  . THYROID LOBECTOMY Bilateral    Partial   . TOE FUSION Left     There were no vitals filed for this visit.      Subjective Assessment - 10/08/16 0801    Subjective Pt. noting her hips and low back have felt better over the weekend.  Chief complaint today is muscular tightness around  R shoulder blade which pt. says has, "bothered her for a while".  Pt. able to walk 4 miles Friday and Saturday without.     Patient Stated Goals "to make me pain-free and get me mobile so I can work out again (not with trainer); avoid surgery"   Currently in Pain? No/denies   Pain Score 0-No pain   Multiple Pain Sites No                         [facility] Adult PT Treatment/Exercise - 10/08/16 0812      Lumbar Exercises: Aerobic   Stationary Bike Rec bike -  lvl 2 x 6 min      Lumbar Exercises: Standing   Functional Squats 3 seconds;15 reps   Functional Squats Limitations TRX    Row AROM;Strengthening;15 reps;Theraband;Both   Theraband Level (Row) Level 3 (Green)   Row Limitations cues for scap. squeeze    Shoulder Extension AROM;Strengthening;Both;15 reps;Theraband   Theraband Level (Shoulder Extension) Level 2 (Red)   Shoulder Extension Limitations cues for scap. squeeze      Lumbar Exercises: Supine  Bridge 3 seconds;15 reps   Bridge Limitations with sustained hip ab/ER into red TB    Other Supine Lumbar Exercises Hooklying bridge with alternating hip ER/Abd with red TB    Other Supine Lumbar Exercises Alternating dead bug 3" x 10 reps each side; HEP review good technique     Lumbar Exercises: Sidelying   Clam 15 reps;3 seconds   Clam Limitations red TB; bil     Knee/Hip Exercises: Standing   Hip Flexion AROM;Stengthening;Right;Left;1 set;10 reps;Knee straight   Hip Flexion Limitations red TB; 2 ski poles    Hip ADduction AROM;Strengthening;Right;Left;1 set;10 reps   Hip ADduction Limitations red TB; 2 ski poles    Hip Abduction AROM;Stengthening;Right;Left;1 set;10 reps;Knee straight   Abduction Limitations red TB; 2 ski poles    Hip Extension AROM;Stengthening;Right;Left;1 set;10 reps;Knee straight   Extension Limitations red TB; 2 ski poles                      PT Long Term Goals - 09/24/16 0907      PT LONG TERM GOAL #1   Title  Independent with HEP by 11/02/16   Status On-going     PT LONG TERM GOAL #2   Title Pt will verbalize/demonstrate good awareness of neutral spine posture and proper body mechanics for daily tasks by 11/02/16   Status On-going     PT LONG TERM GOAL #3   Title Lumbar ROM WFL w/o restriction d/t pain by 11/02/16   Status On-going     PT LONG TERM GOAL #4   Title B hip strength >/= 4+/5 w/o increased pain by 11/02/16   Status On-going     PT LONG TERM GOAL #5   Title Pt will report ability to perform ADLs and daily household chores w/o limitation due to LBP by 11/02/16   Status On-going               Plan - 10/08/16 0809    Clinical Impression Statement Pt. pain free at hips and lower back today and reports pain free over vacation.  Pt. felt good benefit from ionto patch and taping last treatment.  Able to walk 4 miles Friday and Saturday without pain.  Only LBP was with SL bridge today which quickly self-resolved.  Standing 4-way SLR added today with red TB and tolerated well.  Pt. no longer tender over greater trochanters and pain free to end treatment thus taping and further ionto application deferred.  Pt. progressing well at this point.     PT Treatment/Interventions Patient/family education;Neuromuscular re-education;Therapeutic exercise;Therapeutic activities;Functional mobility training;Manual techniques;Taping;Dry needling;Iontophoresis 4mg /ml Dexamethasone;Electrical Stimulation;Moist Heat;Cryotherapy;Ultrasound;Traction;ADLs/Self Care Home Management   PT Next Visit Plan Manual therapy for increased muscle tension/pain; Lumbar stabilization with progression of proximal strengthening as tol; Modalities including ionto or estim PRN for pain      Patient will benefit from skilled therapeutic intervention in order to improve the following deficits and impairments:  Pain, Increased muscle spasms, Impaired flexibility, Decreased range of motion, Decreased strength, Decreased activity  tolerance, Postural dysfunction, Improper body mechanics  Visit Diagnosis: Acute right-sided low back pain with left-sided sciatica  Muscle spasm of back  Radiculopathy, lumbar region     Problem List Patient Active Problem List   Diagnosis Date Noted  . Left lumbar radiculopathy 09/19/2016  . Costochondritis 03/21/2016  . 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 Left ear hearing loss 10/14/2015  . Subconjunctival hemorrhage of right eye 06/28/2015  .  Hashimoto's thyroiditis 06/14/2015  . Thyroid adenoma 05/04/2015  . 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 Hypokalemia 02/13/2015  . 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 Acoustic neuroma ([place]) 12/10/2014  . 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 ADD (attention deficit disorder) 05/11/2014  . Low back pain 05/11/2014  . Chronic constipation 05/11/2014  . Essential (primary) hypertension 05/11/2014  . Gastro-esophageal reflux disease without esophagitis 05/11/2014  . Asthma, mild persistent 05/11/2014  . IFG (impaired fasting glucose) 09/13/2013  . Wheezing 09/13/2013  . Swelling of limb 09/13/2013  . Obesity, unspecified 09/13/2013  . Interdigital neuralgia 04/14/2013  . Iron deficiency anemia, unspecified 02/18/2013    Bess Harvest, PTA 10/08/16 6:53 PM  [facility] [address]  [place], [place], [address] Phone: [phone]   Fax:  [phone]  Name: Cynthia [name] MRN: [id number] Date of Birth: 1960-07-30

## 2016-10-11 ENCOUNTER — Ambulatory Visit: Payer: BLUE CROSS/BLUE SHIELD | Admitting: Physical Therapy

## 2016-10-11 DIAGNOSIS — M62830 Muscle spasm of back: Secondary | ICD-10-CM | POA: Diagnosis not present

## 2016-10-11 DIAGNOSIS — M5416 Radiculopathy, lumbar region: Secondary | ICD-10-CM | POA: Diagnosis not present

## 2016-10-11 DIAGNOSIS — M5442 Lumbago with sciatica, left side: Secondary | ICD-10-CM

## 2016-10-11 NOTE — Therapy (Signed)
[facility] [address]  [place], [place], [address] Phone: [phone]   Fax:  [phone]  Physical Therapy Treatment  Patient Details  Name: Cynthia [name] MRN: [id number] Date of Birth: 1960/10/10 Referring Provider: Aundria Mems, MD  Encounter Date: 10/11/2016      PT End of Session - 10/11/16 1701    Visit Number 6   Number of Visits 12   Date for PT Re-Evaluation 11/02/16   Authorization Type BCBS    Authorization - Number of Visits 60   PT Start Time 1704   PT Stop Time 1745   PT Time Calculation (min) 41 min   Activity Tolerance Patient tolerated treatment well   Behavior During Therapy [facility] for tasks assessed/performed      Past Medical History:  Diagnosis Date  . Acoustic neuroma ([place])   . ADD (attention deficit disorder)    and OCD-on Vyvanse  . Asthma    w/out status asthmaticus  . Awareness under anesthesia   . Benign brain tumor ([place])   . Chronic back pain   . Constipation, chronic    IBS, CIC  . Diabetes mellitus   . Difficult airway   . 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 GERD (gastroesophageal reflux disease)   . Iron deficiency anemia, unspecified 02/18/2013  . Obesity    296 lbs (highest weight in 2012)   . Tendonitis, Achilles, left   . Vertigo     Past Surgical History:  Procedure Laterality Date  . ABLATION     and bladder sling  . ACHILLES TENDON REPAIR     x2  . ANAL FISSURE REPAIR  2012  . BACK SURGERY    . CHOLECYSTECTOMY    . IMPLANTATION BONE ANCHORED HEARING AID    . iron infusion    . MOUTH SURGERY  2015   3 implants  . OVARIAN CYST REMOVAL     x4  . THYROID LOBECTOMY Bilateral    Partial   . TOE FUSION Left     There were no vitals filed for this visit.      Subjective Assessment - 10/11/16 1705    Subjective "feeling pretty good" - hip pain came back some last night - after stretching and walking went away. Doesn't feel like  hse needs an ionto patch today   Pertinent History craniotomy for acustic neuroma tumor resection 08/2015 with sensorineural hearing loss (SNHL) of left ear, followed by vestibular rehab for balance - still has some vestibular imbalance with positional changes ; remote h/o L5-S1 microdiscectomy in 2000   Diagnostic tests MRI in 2015 at [facility] that showed a wide disc protrusion at L4-L5 abutting both left and right L5 nerve roots, slightly more to the left. There is also degenerative changes at L5-S1 with severe bilateral foraminal narrowing left worse than right, she also has some soft tissue surrounding the left S1 nerve root consistent with postoperative fibrosis.   Patient Stated Goals "to make me pain-free and get me mobile so I can work out again (not with trainer); avoid surgery"   Currently in Pain? No/denies   Pain Score 0-No pain                         [facility] Adult PT Treatment/Exercise - 10/11/16 1706      Lumbar Exercises: Stretches   Active Hamstring Stretch Limitations standing hip hinge  10 x 10"   Single Knee to Chest Stretch 5 reps;10 seconds   Double Knee to Chest Stretch 3 reps;10 seconds   Lower Trunk Rotation 10 seconds;5 reps     Lumbar Exercises: Aerobic   Stationary Bike Rec bike - L3 x 6 minutes     Lumbar Exercises: Standing   Functional Squats 10 reps   Functional Squats Limitations no UE support  emphasis on posture     Lumbar Exercises: Supine   Clam 15 reps;3 seconds   Clam Limitations green tband   Bent Knee Raise 15 reps   Bent Knee Raise Limitations green tband   Bridge 15 reps;5 seconds   Bridge Limitations green tband   Isometric Hip Flexion 15 reps;5 seconds   Other Supine Lumbar Exercises supine self mobilizations with foam roller     Lumbar Exercises: Sidelying   Other Sidelying Lumbar Exercises open book 10 x 5" each side     Shoulder Exercises: Stretch   Corner Stretch 1 rep;30 seconds   Corner Stretch  Limitations 3 way door stretch     Manual Therapy   Manual therapy comments education on theracane use as well as ball for self STM - good verbal carryover.                     PT Long Term Goals - 09/24/16 0907      PT LONG TERM GOAL #1   Title Independent with HEP by 11/02/16   Status On-going     PT LONG TERM GOAL #2   Title Pt will verbalize/demonstrate good awareness of neutral spine posture and proper body mechanics for daily tasks by 11/02/16   Status On-going     PT LONG TERM GOAL #3   Title Lumbar ROM WFL w/o restriction d/t pain by 11/02/16   Status On-going     PT LONG TERM GOAL #4   Title B hip strength >/= 4+/5 w/o increased pain by 11/02/16   Status On-going     PT LONG TERM GOAL #5   Title Pt will report ability to perform ADLs and daily household chores w/o limitation due to LBP by 11/02/16   Status On-going               Plan - 10/11/16 1701    Clinical Impression Statement Janisa doing well today - pain free throughout session. Able to progress some hip/core strengthening to green tband with no issue. Much of session spent on introducing good mobility techniques for spine including open book as well as use of foam roller for self mobilizations with good carryover. Some upper chest also introduced to reinforce good upright posture with good ability to perform.    PT Treatment/Interventions Patient/family education;Neuromuscular re-education;Therapeutic exercise;Therapeutic activities;Functional mobility training;Manual techniques;Taping;Dry needling;Iontophoresis 4mg /ml Dexamethasone;Electrical Stimulation;Moist Heat;Cryotherapy;Ultrasound;Traction;ADLs/Self Care Home Management   PT Next Visit Plan Manual therapy for increased muscle tension/pain; Lumbar stabilization with progression of proximal strengthening as tol; Modalities including ionto or estim PRN for pain   Consulted and Agree with Plan of Care Patient      Patient will benefit from  skilled therapeutic intervention in order to improve the following deficits and impairments:  Pain, Increased muscle spasms, Impaired flexibility, Decreased range of motion, Decreased strength, Decreased activity tolerance, Postural dysfunction, Improper body mechanics  Visit Diagnosis: Acute right-sided low back pain with left-sided sciatica  Muscle spasm of back  Radiculopathy, lumbar region     Problem List Patient Active Problem List  Diagnosis Date Noted  . Left lumbar radiculopathy 09/19/2016  . Costochondritis 03/21/2016  . 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 Left ear hearing loss 10/14/2015  . Subconjunctival hemorrhage of right eye 06/28/2015  . Hashimoto's thyroiditis 06/14/2015  . Thyroid adenoma 05/04/2015  . 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 Hypokalemia 02/13/2015  . 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 Acoustic neuroma ([place]) 12/10/2014  . 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 ADD (attention deficit disorder) 05/11/2014  . Low back pain 05/11/2014  . Chronic constipation 05/11/2014  . Essential (primary) hypertension 05/11/2014  . Gastro-esophageal reflux disease without esophagitis 05/11/2014  . Asthma, mild persistent 05/11/2014  . IFG (impaired fasting glucose) 09/13/2013  . Wheezing 09/13/2013  . Swelling of limb 09/13/2013  . Obesity, unspecified 09/13/2013  . Interdigital neuralgia 04/14/2013  . Iron deficiency anemia, unspecified 02/18/2013     Lanney Gins, PT, DPT 10/11/16 6:02 PM   [facility] [address]  [place], [place], [address] Phone: [phone]   Fax:  [phone]  Name: Zykeria Laguardia MRN: [id number] Date of Birth: 09/07/1960

## 2016-10-11 NOTE — Patient Instructions (Signed)
3 way door stretch    3 x 30 seconds A - hands by hips

## 2016-10-12 DIAGNOSIS — D333 Benign neoplasm of cranial nerves: Secondary | ICD-10-CM | POA: Diagnosis not present

## 2016-10-15 ENCOUNTER — Ambulatory Visit: Payer: BLUE CROSS/BLUE SHIELD

## 2016-10-15 DIAGNOSIS — M5442 Lumbago with sciatica, left side: Secondary | ICD-10-CM | POA: Diagnosis not present

## 2016-10-15 DIAGNOSIS — M62830 Muscle spasm of back: Secondary | ICD-10-CM | POA: Diagnosis not present

## 2016-10-15 DIAGNOSIS — M5416 Radiculopathy, lumbar region: Secondary | ICD-10-CM

## 2016-10-15 NOTE — Therapy (Addendum)
[facility] [address]  [address] [place], [place], [address] Phone: [phone]   Fax:  [phone]  Physical Therapy Treatment  Patient Details  Name: Cynthia [name] MRN: [id number] Date of Birth: [birth year] Referring Provider: Rodney Langton, MD  Encounter Date: 10/15/2016      PT End of Session - 10/15/16 1701    Visit Number 7   Number of Visits 12   Date for PT Re-Evaluation 11/02/16   Authorization Type BCBS    Authorization - Number of Visits 60   PT Start Time 1701   PT Stop Time 1750   PT Time Calculation (min) 49 min   Activity Tolerance Patient tolerated treatment well   Behavior During Therapy [facility] for tasks assessed/performed      Past Medical History:  Diagnosis Date  . Acoustic neuroma ([facility])   . ADD (attention deficit disorder)    and OCD-on Vyvanse  . Asthma    w/out status asthmaticus  . Awareness under anesthesia   . Benign brain tumor ([facility])   . Chronic back pain   . Constipation, chronic    IBS, CIC  . Diabetes mellitus   . Difficult airway   . 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 GERD (gastroesophageal reflux disease)   . Iron deficiency anemia, unspecified 02/18/2013  . Obesity    296 lbs (highest weight in 2012)   . Tendonitis, Achilles, left   . Vertigo     Past Surgical History:  Procedure Laterality Date  . ABLATION     and bladder sling  . ACHILLES TENDON REPAIR     x2  . ANAL FISSURE REPAIR  2012  . BACK SURGERY    . CHOLECYSTECTOMY    . IMPLANTATION BONE ANCHORED HEARING AID    . iron infusion    . MOUTH SURGERY  2015   3 implants  . OVARIAN CYST REMOVAL     x4  . THYROID LOBECTOMY Bilateral    Partial   . TOE FUSION Left     There were no vitals filed for this visit.      Subjective Assessment - 10/15/16 1701    Subjective Pt. reporting she has not had back pain in two weeks.  Pt. reporting she has not felt L anterior hip pain in 1 wk.  Pt.  reporting audiologist diagnosed her with positional vertigo and, "performed Epley Maneuver" on her, which she states, has, "resolved my dizziness problems".   Patient Stated Goals "to make me pain-free and get me mobile so I can work out again (not with trainer); avoid surgery"   Currently in Pain? No/denies   Pain Score 0-No pain   Multiple Pain Sites No            [facility] PT Assessment - 10/15/16 1719      AROM   AROM Assessment Site Lumbar   Lumbar Flexion hands to knees - no pain   Lumbar Extension 75% normal range - no pain    Lumbar - Right Side Bend hand to 1 inch bellow lateral knee - no pain   Lumbar - Left Side Bend hand to 1 inch bellow lateral knee - no pain   Lumbar - Right Rotation WNL - no pain   Lumbar - Left Rotation WNL - no pain     Strength   Strength Assessment Site Hip;Knee   Right/Left Hip Right;Left   Right Hip Flexion 4/5  Right Hip Extension 4/5   Right Hip External Rotation  4+/5   Right Hip Internal Rotation 4+/5   Right Hip ABduction 4+/5   Right Hip ADduction 4/5   Left Hip Flexion 4/5   Left Hip Extension 4-/5   Left Hip External Rotation 4+/5   Left Hip Internal Rotation 4+/5   Left Hip ABduction 4+/5   Left Hip ADduction 4-/5   Right/Left Knee Right;Left   Right Knee Flexion 4+/5   Right Knee Extension 4+/5   Left Knee Flexion 4+/5   Left Knee Extension 4+/5                     [facility] Adult PT Treatment/Exercise - 10/15/16 1740      Self-Care   Self-Care Other Self-Care Comments   Other Self-Care Comments  Discussion of need for proper sitting posture with retracted shoulders and lumbar support; Pt. verbalizing increased awareness of this with report of frequent change in position to avoid fatigue in upper shoulders      Lumbar Exercises: Aerobic   Stationary Bike NuStep: lvl 6, 6 min      Lumbar Exercises: Standing   Row AROM;Strengthening;15 reps;Theraband;Both  3" hold    Theraband Level (Row) Level 3 (Green)    Row Limitations tactile for scap. squeeze   Shoulder Extension AROM;Strengthening;Both;15 reps;Theraband   Theraband Level (Shoulder Extension) Level 3 (Green)   Shoulder Extension Limitations tactile for scap. squeeze      Iontophoresis   Type of Iontophoresis Dexamethasone   Location L greater trochanter   Dose 1.0 mL, 80 mA-min   Time 4-6 hr patch                     PT Long Term Goals - 10/15/16 1712      PT LONG TERM GOAL #1   Title Independent with HEP by 11/02/16   Status On-going     PT LONG TERM GOAL #2   Title Pt will verbalize/demonstrate good awareness of neutral spine posture and proper body mechanics for daily tasks by 11/02/16   Status Partially Met  4.30.18: pt. verbalizing increased awareness of sitting posture which has helped her manage pain.  Still with limited demo of proper sitting posture in tx.      PT LONG TERM GOAL #3   Title Lumbar ROM WFL w/o restriction d/t pain by 11/02/16   Status Achieved  4.30.18: lumbar extension still somewhat restricted however no pain all motions     PT LONG TERM GOAL #4   Title B hip strength >/= 4+/5 w/o increased pain by 11/02/16   Status Partially Met     PT LONG TERM GOAL #5   Title Pt will report ability to perform ADLs and daily household chores w/o limitation due to LBP by 11/02/16   Status Achieved               Plan - 10/15/16 1712    Clinical Impression Statement Pt. reporting she has not felt back pain in 2 weeks and is only having occasional R, "shoulder blade pain" now.  Pt. reporting she has had nearly complete resolution from dizziness issues following seeing audiologist with maneuvers performed.  Pt. able to partially meet hip strength goal and is now able to perform daily chores and ADL without back pain.  Pt. lumbar ROM grossly WFL and pain free with testing today.  Pt. reports increased awareness of sitting posture with ability to self-correct with  prolonged sitting to manage pain.  Pt.  tolerated postural therex well today and progressing well at this point.  Pt. seeing MD for f/u on 5.2.   PT Treatment/Interventions Patient/family education;Neuromuscular re-education;Therapeutic exercise;Therapeutic activities;Functional mobility training;Manual techniques;Taping;Dry needling;Iontophoresis '4mg'$ /ml Dexamethasone;Electrical Stimulation;Moist Heat;Cryotherapy;Ultrasound;Traction;ADLs/Self Care Home Management   PT Next Visit Plan review gym activities; discuss trying gym activities for adjustment to program      Patient will benefit from skilled therapeutic intervention in order to improve the following deficits and impairments:  Pain, Increased muscle spasms, Impaired flexibility, Decreased range of motion, Decreased strength, Decreased activity tolerance, Postural dysfunction, Improper body mechanics  Visit Diagnosis: Acute right-sided low back pain with left-sided sciatica  Muscle spasm of back  Radiculopathy, lumbar region     Problem List Patient Active Problem List   Diagnosis Date Noted  . Left lumbar radiculopathy 09/19/2016  . Costochondritis 03/21/2016  . 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 Left ear hearing loss 10/14/2015  . Subconjunctival hemorrhage of right eye 06/28/2015  . Hashimoto's thyroiditis 06/14/2015  . Thyroid adenoma 05/04/2015  . 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 Hypokalemia 02/13/2015  . 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 Acoustic neuroma ([place]) 12/10/2014  . 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 ADD (attention deficit disorder) 05/11/2014  . Low back pain 05/11/2014  . Chronic constipation 05/11/2014  . Essential (primary) hypertension 05/11/2014  . Gastro-esophageal reflux disease without esophagitis 05/11/2014  . Asthma, mild persistent 05/11/2014  . IFG (impaired fasting glucose) 09/13/2013  . Wheezing 09/13/2013  . Swelling of limb 09/13/2013  . Obesity, unspecified 09/13/2013   . Interdigital neuralgia 04/14/2013  . Iron deficiency anemia, unspecified 02/18/2013    Bess Harvest, PTA 10/15/16 6:22 PM  [facility] [address]  [place], [place], [address] Phone: [phone]   Fax:  [phone]  Name: Cynthia [name] MRN: [id number] Date of Birth: [birth year]

## 2016-10-16 DIAGNOSIS — D696 Thrombocytopenia, unspecified: Secondary | ICD-10-CM | POA: Diagnosis not present

## 2016-10-17 ENCOUNTER — Ambulatory Visit (INDEPENDENT_AMBULATORY_CARE_PROVIDER_SITE_OTHER): Payer: BLUE CROSS/BLUE SHIELD | Admitting: Sports Medicine

## 2016-10-17 ENCOUNTER — Ambulatory Visit (INDEPENDENT_AMBULATORY_CARE_PROVIDER_SITE_OTHER): Payer: BLUE CROSS/BLUE SHIELD | Admitting: Physician Assistant

## 2016-10-17 ENCOUNTER — Encounter: Payer: Self-pay | Admitting: Sports Medicine

## 2016-10-17 ENCOUNTER — Encounter: Payer: Self-pay | Admitting: Physician Assistant

## 2016-10-17 VITALS — BP 113/76 | HR 80 | Ht 67.5 in | Wt 213.0 lb

## 2016-10-17 DIAGNOSIS — M5416 Radiculopathy, lumbar region: Secondary | ICD-10-CM

## 2016-10-17 DIAGNOSIS — R0789 Other chest pain: Secondary | ICD-10-CM

## 2016-10-17 DIAGNOSIS — R454 Irritability and anger: Secondary | ICD-10-CM

## 2016-10-17 DIAGNOSIS — J4530 Mild persistent asthma, uncomplicated: Secondary | ICD-10-CM

## 2016-10-17 DIAGNOSIS — F900 Attention-deficit hyperactivity disorder, predominantly inattentive type: Secondary | ICD-10-CM

## 2016-10-17 LAB — CBC WITH DIFFERENTIAL/PLATELET
Basophils Absolute: 0 cells/uL (ref 0–200)
Basophils Relative: 0 %
Eosinophils Absolute: 0 cells/uL — ABNORMAL LOW (ref 15–500)
Eosinophils Relative: 0 %
HCT: 39.8 % (ref 35.0–45.0)
Hemoglobin: 13.7 g/dL (ref 11.7–15.5)
Lymphocytes Relative: 27 %
Lymphs Abs: 1566 cells/uL (ref 850–3900)
MCH: 29 pg (ref 27.0–33.0)
MCHC: 34.4 g/dL (ref 32.0–36.0)
MCV: 84.1 fL (ref 80.0–100.0)
MPV: 8.6 fL (ref 7.5–12.5)
Monocytes Absolute: 406 cells/uL (ref 200–950)
Monocytes Relative: 7 %
Neutro Abs: 3828 cells/uL (ref 1500–7800)
Neutrophils Relative %: 66 %
Platelets: 137 10*3/uL — ABNORMAL LOW (ref 140–400)
RBC: 4.73 MIL/uL (ref 3.80–5.10)
RDW: 13.5 % (ref 11.0–15.0)
WBC: 5.8 10*3/uL (ref 3.8–10.8)

## 2016-10-17 LAB — FERRITIN: Ferritin: 29 ng/mL (ref 10–232)

## 2016-10-17 MED ORDER — LISDEXAMFETAMINE DIMESYLATE 60 MG PO CAPS: 60.0000 mg | ORAL_CAPSULE | ORAL | 0 refills | Status: DC

## 2016-10-17 MED ORDER — BUDESONIDE-FORMOTEROL FUMARATE 160-4.5 MCG/ACT IN AERO: 2.0000 | INHALATION_SPRAY | Freq: Two times a day (BID) | RESPIRATORY_TRACT | 2 refills | Status: DC

## 2016-10-17 MED ORDER — CYCLOBENZAPRINE HCL 10 MG PO TABS: 10.0000 mg | ORAL_TABLET | Freq: Every day | ORAL | 3 refills | Status: DC

## 2016-10-17 MED ORDER — ALBUTEROL SULFATE HFA 108 (90 BASE) MCG/ACT IN AERS: 2.0000 | INHALATION_SPRAY | Freq: Four times a day (QID) | RESPIRATORY_TRACT | 2 refills | Status: DC | PRN

## 2016-10-17 MED ORDER — BUPROPION HCL ER (XL) 300 MG PO TB24: 300.0000 mg | ORAL_TABLET | Freq: Every day | ORAL | 2 refills | Status: DC

## 2016-10-17 NOTE — Progress Notes (Signed)
  Subjective:    CC: Follow-up  HPI: Lumbar degenerative disc disease: Pain resolved with Flexeril, Celebrex, prednisone and more importantly aggressive physical therapy.  Past medical history:  Negative.  See flowsheet/record as well for more information.  Surgical history: Negative.  See flowsheet/record as well for more information.  Family history: Negative.  See flowsheet/record as well for more information.  Social history: Negative.  See flowsheet/record as well for more information.  Allergies, and medications have been entered into the medical record, reviewed, and no changes needed.   Review of Systems: No fevers, chills, night sweats, weight loss, chest pain, or shortness of breath.   Objective:    General: Well Developed, well nourished, and in no acute distress.  Neuro: Alert and oriented x3, extra-ocular muscles intact, sensation grossly intact.  HEENT: Normocephalic, atraumatic, pupils equal round reactive to light, neck supple, no masses, no lymphadenopathy, thyroid nonpalpable.  Skin: Warm and dry, no rashes. Cardiac: Regular rate and rhythm, no murmurs rubs or gallops, no lower extremity edema.  Respiratory: Clear to auscultation bilaterally. Not using accessory muscles, speaking in full sentences.  Impression and Recommendations:    Left lumbar radiculopathy Symptoms resolved with aggressive therapy, she is using Flexeril at bedtime and Celebrex during the day which is appropriate. Return to see [place] as needed, if she fails conservative measures again we will proceed with MRI with and without contrast for interventional planning.  I think at this point she can be graduated to a home exercise program.

## 2016-10-17 NOTE — Assessment & Plan Note (Addendum)
Symptoms resolved with aggressive therapy, she is using Flexeril at bedtime and Celebrex during the day which is appropriate. Return to see [place] as needed, if she fails conservative measures again we will proceed with MRI with and without contrast for interventional planning.  I think at this point she can be graduated to a home exercise program.

## 2016-10-17 NOTE — Patient Instructions (Signed)
Iron-Rich Diet Iron is a mineral that helps your body to produce hemoglobin. Hemoglobin is a protein in your red blood cells that carries oxygen to your body's tissues. Eating too little iron may cause you to feel weak and tired, and it can increase your risk for infection. Eating enough iron is necessary for your body's metabolism, muscle function, and nervous system. Iron is naturally found in many foods. It can also be added to foods or fortified in foods. There are two types of dietary iron:  Heme iron. Heme iron is absorbed by the body more easily than nonheme iron. Heme iron is found in meat, poultry, and fish.  Nonheme iron. Nonheme iron is found in dietary supplements, iron-fortified grains, beans, and vegetables. You may need to follow an iron-rich diet if:  You have been diagnosed with iron deficiency or iron-deficiency anemia.  You have a condition that prevents you from absorbing dietary iron, such as:  Infection in your intestines.  Celiac disease. This involves long-lasting (chronic) inflammation of your intestines.  You do not eat enough iron.  You eat a diet that is high in foods that impair iron absorption.  You have lost a lot of blood.  You have heavy bleeding during your menstrual cycle.  You are pregnant. What is my plan? Your health care provider may help you to determine how much iron you need per day based on your condition. Generally, when a person consumes sufficient amounts of iron in the diet, the following iron needs are met:  Men.  24-56 years old: 11 mg per day.  59-71 years old: 8 mg per day.  Women.  46-49 years old: 15 mg per day.  61-31 years old: 18 mg per day.  Over 8 years old: 8 mg per day.  Pregnant women: 27 mg per day.  Breastfeeding women: 9 mg per day. What do I need to know about an iron-rich diet?  Eat fresh fruits and vegetables that are high in vitamin C along with foods that are high in iron. This will help increase the  amount of iron that your body absorbs from food, especially with foods containing nonheme iron. Foods that are high in vitamin C include oranges, peppers, tomatoes, and mango.  Take iron supplements only as directed by your health care provider. Overdose of iron can be life-threatening. If you were prescribed iron supplements, take them with orange juice or a vitamin C supplement.  Cook foods in pots and pans that are made from iron.  Eat nonheme iron-containing foods alongside foods that are high in heme iron. This helps to improve your iron absorption.  Certain foods and drinks contain compounds that impair iron absorption. Avoid eating these foods in the same meal as iron-rich foods or with iron supplements. These include:  Coffee, black tea, and red wine.  Milk, dairy products, and foods that are high in calcium.  Beans, soybeans, and peas.  Whole grains.  When eating foods that contain both nonheme iron and compounds that impair iron absorption, follow these tips to absorb iron better.  Soak beans overnight before cooking.  Soak whole grains overnight and drain them before using.  Ferment flours before baking, such as using yeast in bread dough. What foods can I eat? Grains  Iron-fortified breakfast cereal. Iron-fortified whole-wheat bread. Enriched rice. Sprouted grains. Vegetables  Spinach. Potatoes with skin. Green peas. Broccoli. Red and green bell peppers. Fermented vegetables. Fruits  Prunes. Raisins. Oranges. Strawberries. Mango. Grapefruit. Meats and Other Protein Sources  Sources  °Beef liver. Oysters. Beef. Shrimp. Turkey. Chicken. Tuna. Sardines. Chickpeas. Nuts. Tofu. °Beverages  °Tomato juice. Fresh orange juice. Prune juice. Hibiscus tea. Fortified instant breakfast shakes. °Condiments  °Tahini. Fermented soy sauce. °Sweets and Desserts  °Black-strap molasses. °Other  °Wheat germ. °The items listed above may not be a complete list of recommended foods or beverages.  Contact your dietitian for more options.  °What foods are not recommended? °Grains  °Whole grains. Bran cereal. Bran flour. Oats. °Vegetables  °Artichokes. Brussels sprouts. Kale. °Fruits  °Blueberries. Raspberries. Strawberries. Figs. °Meats and Other Protein Sources  °Soybeans. Products made from soy protein. °Dairy  °Milk. Cream. Cheese. Yogurt. Cottage cheese. °Beverages  °Coffee. Black tea. Red wine. °Sweets and Desserts  °Cocoa. Chocolate. Ice cream. °Other  °Basil. Oregano. Parsley. °The items listed above may not be a complete list of foods and beverages to avoid. Contact your dietitian for more information.  °This information is not intended to replace advice given to you by your health care provider. Make sure you discuss any questions you have with your health care provider. °Document Released: 01/16/2005 Document Revised: 12/23/2015 Document Reviewed: 12/30/2013 °Elsevier Interactive Patient Education © 2017 [employer] ° ° ° °

## 2016-10-17 NOTE — Progress Notes (Signed)
   Subjective:    Patient ID: Cynthia [name], female    DOB: 06/14/1961, 56 y.o.   MRN: [id number]  HPI Pt is a 56 yo female who presents to the clinic for 3 month ADD follow up. She is doing well with vyanse. No side effects. She does feel like it might night be working as good as it has in the past. She is also wondering if she is just "slower" after her brain surgery.   Her mood has improved a lot being on wellbutrin. She is a lot less irritable. No side effects.   She did stop advair for her asthma due to cost. She is having more SOB/wheezing and flares. She does not have a rescue inhaler.     Review of Systems  All other systems reviewed and are negative.      Objective:   Physical Exam  Constitutional: She is oriented to person, place, and time. She appears well-developed and well-nourished.  HENT:  Head: Normocephalic and atraumatic.  Cardiovascular: Normal rate, regular rhythm and normal heart sounds.   Pulmonary/Chest: Effort normal and breath sounds normal. She has no wheezes.  Neurological: She is alert and oriented to person, place, and time.  Psychiatric: She has a normal mood and affect. Her behavior is normal.          Assessment & Plan:  [name] KitchenMarland KitchenLaportia was seen today for adhd.  Diagnoses and all orders for this visit:  Mild persistent asthma without complication -     albuterol (PROVENTIL HFA;VENTOLIN HFA) 108 (90 Base) MCG/ACT inhaler; Inhale 2 puffs into the lungs every 6 (six) hours as needed for wheezing or shortness of breath. -     budesonide-formoterol (SYMBICORT) 160-4.5 MCG/ACT inhaler; Inhale 2 puffs into the lungs 2 (two) times daily.  Attention deficit hyperactivity disorder (ADHD), predominantly inattentive type -     Discontinue: lisdexamfetamine (VYVANSE) 60 MG capsule; Take 1 capsule (60 mg total) by mouth every morning. -     Discontinue: lisdexamfetamine (VYVANSE) 60 MG capsule; Take 1 capsule (60 mg total) by mouth every morning. Do not refill until  11/16/16. -     lisdexamfetamine (VYVANSE) 60 MG capsule; Take 1 capsule (60 mg total) by mouth every morning. Do not refill until 12/16/16.  Irritable -     buPROPion (WELLBUTRIN XL) 300 MG 24 hr tablet; Take 1 tablet (300 mg total) by mouth daily.   Added symbicort to see if covered. This is the worst tme of year for asthma. Consider claritin/flonase daily for allergy symptoms.  Albuterol as needed.  3 months of vyvanse sent.

## 2016-10-18 ENCOUNTER — Ambulatory Visit: Payer: BLUE CROSS/BLUE SHIELD | Attending: Sports Medicine

## 2016-10-18 DIAGNOSIS — M5416 Radiculopathy, lumbar region: Secondary | ICD-10-CM | POA: Diagnosis present

## 2016-10-18 DIAGNOSIS — M62830 Muscle spasm of back: Secondary | ICD-10-CM | POA: Insufficient documentation

## 2016-10-18 DIAGNOSIS — M5442 Lumbago with sciatica, left side: Secondary | ICD-10-CM | POA: Diagnosis not present

## 2016-10-18 NOTE — Therapy (Signed)
[facility] [address]  [place], [place], [address] Phone: [phone]   Fax:  [phone]  Physical Therapy Treatment  Patient Details  Name: Cynthia [name] MRN: [id number] Date of Birth: 03/11/1961 Referring Provider: Aundria Mems, MD  Encounter Date: 10/18/2016      PT End of Session - 10/18/16 1719    Visit Number 8   Number of Visits 12   Date for PT Re-Evaluation 11/02/16   Authorization Type BCBS    Authorization - Number of Visits 60   PT Start Time 1701   PT Stop Time 1755   PT Time Calculation (min) 54 min   Activity Tolerance Patient tolerated treatment well   Behavior During Therapy [facility] for tasks assessed/performed      Past Medical History:  Diagnosis Date  . Acoustic neuroma ([place])   . ADD (attention deficit disorder)    and OCD-on Vyvanse  . Asthma    w/out status asthmaticus  . Awareness under anesthesia   . Benign brain tumor ([place])   . Chronic back pain   . Constipation, chronic    IBS, CIC  . Diabetes mellitus   . Difficult airway   . 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 GERD (gastroesophageal reflux disease)   . Iron deficiency anemia, unspecified 02/18/2013  . Obesity    296 lbs (highest weight in 2012)   . Tendonitis, Achilles, left   . Vertigo     Past Surgical History:  Procedure Laterality Date  . ABLATION     and bladder sling  . ACHILLES TENDON REPAIR     x2  . ANAL FISSURE REPAIR  2012  . BACK SURGERY    . CHOLECYSTECTOMY    . IMPLANTATION BONE ANCHORED HEARING AID    . iron infusion    . MOUTH SURGERY  2015   3 implants  . OVARIAN CYST REMOVAL     x4  . THYROID LOBECTOMY Bilateral    Partial   . TOE FUSION Left     There were no vitals filed for this visit.      Subjective Assessment - 10/18/16 1707    Subjective Pt. reporting she was able to do kick boxer without issue.  Pt. reporting MD released her to transition from therapy to  home exercise program.  Pt. with plans to attend gym circuit training classes Monday, Tuesday, and Wednesday of next week.     Patient Stated Goals "to make me pain-free and get me mobile so I can work out again (not with trainer); avoid surgery"   Currently in Pain? No/denies   Pain Score 0-No pain   Multiple Pain Sites No                         [facility] Adult PT Treatment/Exercise - 10/18/16 1808      Self-Care   Self-Care Other Self-Care Comments   Other Self-Care Comments  Discussion and demonstration of proper form and technique with various gym activities involved in group circuit training, kick boxing, focusing on proper lunging, squatting form; (see pt. instruction)     Lumbar Exercises: Aerobic   Stationary Bike Recumbent bike: lvl 2, 46mn    Elliptical Lvl 2.5, 4 min      Lumbar Exercises: Standing   Functional Squats 10 reps   Functional Squats Limitations TRX; no UE support  emphasis on posture  Forward Lunge 10 reps;3 seconds   Forward Lunge Limitations no support working on posture     Lumbar Exercises: Supine   Other Supine Lumbar Exercises Hooklying abdominal crunk with feet off table x 10 reps; reviewed with pt. upon request as she will perform this in circuit training class  pt. instructed to perform these on soft surface at gym                     PT [place] - 10/15/16 [place] #1   Title Independent with HEP by 11/02/16   Status On-going     PT LONG TERM GOAL #2   Title Pt will verbalize/demonstrate good awareness of neutral spine posture and proper body mechanics for daily tasks by 11/02/16   Status Partially Met  4.30.18: pt. verbalizing increased awareness of sitting posture which has helped her manage pain.  Still with limited demo of proper sitting posture in tx.      PT LONG TERM GOAL #3   Title Lumbar ROM WFL w/o restriction d/t pain by 11/02/16   Status Achieved  4.30.18: lumbar extension still  somewhat restricted however no pain all motions     PT LONG TERM GOAL #4   Title B hip strength >/= 4+/5 w/o increased pain by 11/02/16   Status Partially Met     PT LONG TERM GOAL #5   Title Pt will report ability to perform ADLs and daily household chores w/o limitation due to LBP by 11/02/16   Status Achieved               Plan - 10/18/16 1810    Clinical Impression Statement Pt. doing well today pain free.  MD f/u went well with MD releasing pt. to transition to home program.  Discussion with PT and pt. today regarding scheduling one more visit on 5.10 to monitor tolerance to upcoming exercise classes at local gym.  Pt. requesting review of proper technique with gym exercises today thus instruction and demonstration of proper technique to avoid excess strain on lumbar spine.  Pt. verbalizing plan to use caution with upcoming advanced circuit training classes.  Will monitor response to gym classes next visit.     PT Treatment/Interventions Patient/family education;Neuromuscular re-education;Therapeutic exercise;Therapeutic activities;Functional mobility training;Manual techniques;Taping;Dry needling;Iontophoresis 77m/ml Dexamethasone;Electrical Stimulation;Moist Heat;Cryotherapy;Ultrasound;Traction;ADLs/Self Care Home Management   PT Next Visit Plan Continue to review gym activities; monitor tolerance to gym circuit training      Patient will benefit from skilled therapeutic intervention in order to improve the following deficits and impairments:  Pain, Increased muscle spasms, Impaired flexibility, Decreased range of motion, Decreased strength, Decreased activity tolerance, Postural dysfunction, Improper body mechanics  Visit Diagnosis: Acute right-sided low back pain with left-sided sciatica  Muscle spasm of back  Radiculopathy, lumbar region     Problem List Patient Active Problem List   Diagnosis Date Noted  . Irritable 10/17/2016  . Left lumbar radiculopathy 09/19/2016   . Costochondritis 03/21/2016  . 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 Left ear hearing loss 10/14/2015  . Subconjunctival hemorrhage of right eye 06/28/2015  . Hashimoto's thyroiditis 06/14/2015  . Thyroid adenoma 05/04/2015  . 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 Hypokalemia 02/13/2015  . 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[place] 02/11/2015  . Acoustic neuroma ([place] 12/10/2014  . 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 ADD (attention deficit disorder) 05/11/2014  . Low back pain 05/11/2014  .  Chronic constipation 05/11/2014  . Essential (primary) hypertension 05/11/2014  . Gastro-esophageal reflux disease without esophagitis 05/11/2014  . Asthma, mild persistent 05/11/2014  . IFG (impaired fasting glucose) 09/13/2013  . Wheezing 09/13/2013  . Swelling of limb 09/13/2013  . Obesity, unspecified 09/13/2013  . Interdigital neuralgia 04/14/2013  . Iron deficiency anemia, unspecified 02/18/2013    Bess Harvest, PTA 10/19/16 2:07 PM  [facility] [address]  [place], [place], [address] Phone: [phone]   Fax:  [phone]  Name: Cynthia [name] MRN: [id number] Date of Birth: 1960-12-12

## 2016-10-22 ENCOUNTER — Ambulatory Visit: Payer: BLUE CROSS/BLUE SHIELD | Admitting: Physical Therapy

## 2016-10-25 ENCOUNTER — Ambulatory Visit: Payer: BLUE CROSS/BLUE SHIELD

## 2016-10-25 DIAGNOSIS — M5442 Lumbago with sciatica, left side: Secondary | ICD-10-CM | POA: Diagnosis not present

## 2016-10-25 DIAGNOSIS — M5416 Radiculopathy, lumbar region: Secondary | ICD-10-CM | POA: Diagnosis not present

## 2016-10-25 DIAGNOSIS — M62830 Muscle spasm of back: Secondary | ICD-10-CM

## 2016-10-25 NOTE — Therapy (Addendum)
[facility] [address]  [address] [place], [place], [address] Phone: [phone]   Fax:  [phone]  Physical Therapy Treatment  Patient Details  Name: Cynthia [name] MRN: [id number] Date of Birth: 10/20/1960 Referring Provider: Rodney Langton, MD  Encounter Date: 10/25/2016      PT End of Session - 10/25/16 1707    Visit Number 9   Number of Visits 12   Date for PT Re-Evaluation 11/02/16   Authorization Type BCBS    Authorization - Number of Visits 60   PT Start Time 1700   PT Stop Time 1757   PT Time Calculation (min) 57 min   Activity Tolerance Patient tolerated treatment well   Behavior During Therapy [facility] for tasks assessed/performed      Past Medical History:  Diagnosis Date  . Acoustic neuroma ([facility])   . ADD (attention deficit disorder)    and OCD-on Vyvanse  . Asthma    w/out status asthmaticus  . Awareness under anesthesia   . Benign brain tumor ([facility])   . Chronic back pain   . Constipation, chronic    IBS, CIC  . Diabetes mellitus   . Difficult airway   . 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 GERD (gastroesophageal reflux disease)   . Iron deficiency anemia, unspecified 02/18/2013  . Obesity    296 lbs (highest weight in 2012)   . Tendonitis, Achilles, left   . Vertigo     Past Surgical History:  Procedure Laterality Date  . ABLATION     and bladder sling  . ACHILLES TENDON REPAIR     x2  . ANAL FISSURE REPAIR  2012  . BACK SURGERY    . CHOLECYSTECTOMY    . IMPLANTATION BONE ANCHORED HEARING AID    . iron infusion    . MOUTH SURGERY  2015   3 implants  . OVARIAN CYST REMOVAL     x4  . THYROID LOBECTOMY Bilateral    Partial   . TOE FUSION Left     There were no vitals filed for this visit.      Subjective Assessment - 10/25/16 1703    Subjective Pt. reporting she has been able to perform last three days of cirbuit training without back pain and only has muscular  soreness following this.     Patient Stated Goals "to make me pain-free and get me mobile so I can work out again (not with trainer); avoid surgery"   Currently in Pain? No/denies   Pain Score 0-No pain   Multiple Pain Sites No            [facility] PT Assessment - 10/25/16 1722      Observation/Other Assessments   Focus on Therapeutic Outcomes (FOTO)  83% (17% limitation)     Strength   Strength Assessment Site Hip;Knee   Right/Left Hip Right;Left   Right Hip Flexion 4/5   Right Hip Extension 4/5   Right Hip External Rotation  4+/5   Right Hip Internal Rotation 4+/5   Right Hip ABduction 4+/5   Right Hip ADduction 4+/5   Left Hip Flexion 4/5   Left Hip Extension 4/5   Left Hip External Rotation 4+/5   Left Hip Internal Rotation 4+/5   Left Hip ABduction 4+/5   Left Hip ADduction 4/5   Right/Left Knee Right;Left   Right Knee Flexion 4+/5   Right Knee Extension 5/5   Left  Knee Flexion 4+/5   Left Knee Extension 5/5                     [facility] Adult PT Treatment/Exercise - 10/25/16 1717      Self-Care   Self-Care Other Self-Care Comments   Other Self-Care Comments  Review of current HEP for appropriateness of activities, need for increased resistance bands, etc     Lumbar Exercises: Stretches   Lower Trunk Rotation 10 seconds;3 reps     Lumbar Exercises: Aerobic   Stationary Bike Recumbent bike: lvl 3, 79mn    Elliptical Lvl 4.5, 4 min      Lumbar Exercises: Standing   Other Standing Lumbar Exercises Clean and press with 5# dumbbells x 5 reps; reviewed on pt. request; cues for upright posture     Lumbar Exercises: Supine   Other Supine Lumbar Exercises Hooklying dead bug with feet off table x 10 reps      Lumbar Exercises: Sidelying   Other Sidelying Lumbar Exercises R sidelying L hip adduction x 15 reps     Knee/Hip Exercises: Standing   Hip Flexion Stengthening;Right;Left;1 set;Knee straight;15 reps   Hip Flexion Limitations red TB      Knee/Hip  Exercises: Supine   Single Leg Bridge Right;Left;Strengthening;1 set;10 reps  with adduction ball squeeze      Iontophoresis   Type of Iontophoresis Dexamethasone   Location L greater trochanter   Dose 1.0 mL, 80 mA-min   Time 4-6 hr patch                PT Education - 10/25/16 1807    Education provided Yes   Education Details Single leg bridge with adduction ball squeeze, sidelying adduction, standing hip flexion with red TB (pt. has), blue looped TB issued to pt. for clam shell    Person(s) Educated Patient   Methods Explanation;Demonstration;Verbal cues;Handout   Comprehension Verbalized understanding;Returned demonstration;Verbal cues required;Need further instruction             PT Long Term Goals - 10/25/16 1720      PT LONG TERM GOAL #1   Title Independent with HEP by 11/02/16   Status Partially Met  5.10.18: met for current      PT LONG TERM GOAL #2   Title Pt will verbalize/demonstrate good awareness of neutral spine posture and proper body mechanics for daily tasks by 11/02/16   Status Partially Met     PT LONG TERM GOAL #3   Title Lumbar ROM WFL w/o restriction d/t pain by 11/02/16   Status Achieved  4.30.18: lumbar extension still somewhat restricted however no pain all motions     PT LONG TERM GOAL #4   Title B hip strength >/= 4+/5 w/o increased pain by 11/02/16   Status Partially Met  5.10.18:  B hip flexors, extensors, and L adductors 4/5 still.  All other groups 4+/5 or better      PT LONG TERM GOAL #5   Title Pt will report ability to perform ADLs and daily household chores w/o limitation due to LBP by 11/02/16   Status Achieved               Plan - 10/25/16 1708    Clinical Impression Statement Pt. reporting she was able to perform three straight days of circuit training this week with only muscular soreness.  Pt. requesting to be put on 30-day hold from therapy.  Discussion with supervising PT regarding possibility of hold with  PT  agreeing to this.  Pt. partially meeting strength goal today and not limited by pain with household chores or ADLs now.  Pt. independent with HEP with good recall and consistency.  Lumbar ROM WFL with only slight limitation in extension and pain free.  HEP updated to include strengthening activities to target remaining deficits.  Ionto patch applied to R hip due to some remaining tenderness here.  Pt. now on 30-day hold from therapy.     PT Treatment/Interventions Patient/family education;Neuromuscular re-education;Therapeutic exercise;Therapeutic activities;Functional mobility training;Manual techniques;Taping;Dry needling;Iontophoresis '4mg'$ /ml Dexamethasone;Electrical Stimulation;Moist Heat;Cryotherapy;Ultrasound;Traction;ADLs/Self Care Home Management   PT Next Visit Plan pt. on 30-day hold from therapy      Patient will benefit from skilled therapeutic intervention in order to improve the following deficits and impairments:  Pain, Increased muscle spasms, Impaired flexibility, Decreased range of motion, Decreased strength, Decreased activity tolerance, Postural dysfunction, Improper body mechanics  Visit Diagnosis: Acute right-sided low back pain with left-sided sciatica  Muscle spasm of back  Radiculopathy, lumbar region     Problem List Patient Active Problem List   Diagnosis Date Noted  . Irritable 10/17/2016  . Left lumbar radiculopathy 09/19/2016  . Costochondritis 03/21/2016  . 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 Left ear hearing loss 10/14/2015  . Subconjunctival hemorrhage of right eye 06/28/2015  . Hashimoto's thyroiditis 06/14/2015  . Thyroid adenoma 05/04/2015  . 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 Hypokalemia 02/13/2015  . 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 Acoustic neuroma ([place]) 12/10/2014  . 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 ADD (attention deficit disorder) 05/11/2014  . Low back pain 05/11/2014  . Chronic  constipation 05/11/2014  . Essential (primary) hypertension 05/11/2014  . Gastro-esophageal reflux disease without esophagitis 05/11/2014  . Asthma, mild persistent 05/11/2014  . IFG (impaired fasting glucose) 09/13/2013  . Wheezing 09/13/2013  . Swelling of limb 09/13/2013  . Obesity, unspecified 09/13/2013  . Interdigital neuralgia 04/14/2013  . Iron deficiency anemia, unspecified 02/18/2013    Bess Harvest, PTA 10/25/16 6:25 PM  [facility] [address]  [place], [place], [address] Phone: [phone]   Fax:  [phone]  Name: Cynthia [name] MRN: [id number] Date of Birth: 07-06-1960  PHYSICAL THERAPY DISCHARGE SUMMARY  Visits from Start of Care: 9  Current functional level related to goals / functional outcomes:   Pt demonstrated good progress with PT with resolution of LBP, improved lumbar ROM and increased LE strength. PT pleased with progress and reported ability to resume normal daily tasks and working out w/o limitation due to LBP, therefore she was placed on hold x 30 days. PT has not needed to return in 30 days, therefore will proceed with discharge from PT for this episode.   Remaining deficits:   Refer to above clinical impression & goals.   Education / Equipment:   HEP  Plan: Patient agrees to discharge.  Patient goals were partially met. Patient is being discharged due to being pleased with the current functional level.  ?????     Percival Spanish, PT, MPT 11/26/16, 9:38 AM  [facility] [address]  [place], [place], [address] Phone: [phone]   Fax:  [phone]

## 2016-11-04 ENCOUNTER — Other Ambulatory Visit: Payer: Self-pay | Admitting: Internal Medicine

## 2016-11-28 ENCOUNTER — Ambulatory Visit: Payer: BLUE CROSS/BLUE SHIELD | Admitting: Physician Assistant

## 2016-11-29 DIAGNOSIS — H524 Presbyopia: Secondary | ICD-10-CM | POA: Diagnosis not present

## 2016-11-29 DIAGNOSIS — R51 Headache: Secondary | ICD-10-CM | POA: Diagnosis not present

## 2016-11-30 ENCOUNTER — Encounter: Payer: Self-pay | Admitting: Obstetrics & Gynecology

## 2016-11-30 ENCOUNTER — Ambulatory Visit (INDEPENDENT_AMBULATORY_CARE_PROVIDER_SITE_OTHER): Payer: BLUE CROSS/BLUE SHIELD | Admitting: Obstetrics & Gynecology

## 2016-11-30 VITALS — BP 126/80 | HR 88 | Resp 16 | Ht 67.0 in | Wt 212.0 lb

## 2016-11-30 DIAGNOSIS — Z01419 Encounter for gynecological examination (general) (routine) without abnormal findings: Secondary | ICD-10-CM

## 2016-11-30 DIAGNOSIS — Z205 Contact with and (suspected) exposure to viral hepatitis: Secondary | ICD-10-CM | POA: Diagnosis not present

## 2016-11-30 MED ORDER — ESTRADIOL 0.1 MG/GM VA CREA: TOPICAL_CREAM | VAGINAL | 2 refills | Status: DC

## 2016-11-30 NOTE — Progress Notes (Addendum)
56 y.o. G0P0000 MarriedCaucasianF here for annual exam.  Reports she is doing well.  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  Had hearing loss and had bone anchored hearing aid placement.  Hearing is much better.  Has some balance issues since surgery and uses cane in a crowd just for balance.    Had shingles after her surgery last year.    Denies vaginal bleeding.  PCP:  Iran Planas Endocrinologist:  Dr. Cruzita Lederer   No LMP recorded. Patient has had an ablation.          Sexually active: Yes.    The current method of family planning is vasectomy.    Exercising: Yes.    kickbox, bootcamp, hike Smoker:  no  Health Maintenance: Pap:  08/12/15 Neg. HR HPV:neg   04/16/13 Neg  History of abnormal Pap:  no MMG:  02/22/15 BIRADS1:neg .  Aware she is due.  States she will schedule  Colonoscopy:  12/15/10 Normal BMD:   Never TDaP: 07/2016 Pneumonia vaccine(s):  No Zostavax:   No Hep C testing: No Screening Labs: PCP   reports that she has never smoked. She has never used smokeless tobacco. She reports that she drinks about 0.5 - 1.0 oz of alcohol per week . She reports that she does not use drugs.  Past Medical History:  Diagnosis Date  . Acoustic neuroma ([place])   . ADD (attention deficit disorder)    and OCD-on Vyvanse  . Asthma    w/out status asthmaticus  . Awareness under anesthesia   . Benign brain tumor ([place])   . Chronic back pain   . Constipation, chronic    IBS, CIC  . Diabetes mellitus   . Difficult airway   . 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 GERD (gastroesophageal reflux disease)   . Iron deficiency anemia, unspecified 02/18/2013  . Obesity    296 lbs (highest weight in 2012)   . Tendonitis, Achilles, left   . Vertigo     Past Surgical History:  Procedure Laterality Date  . ABLATION     and bladder sling  . ACHILLES TENDON REPAIR     x2  . ANAL FISSURE REPAIR  2012  . BACK SURGERY    . CHOLECYSTECTOMY    . IMPLANTATION BONE ANCHORED  HEARING AID    . iron infusion    . MASTOIDECTOMY  09/13/2015  . MOUTH SURGERY  2015   3 implants  . OVARIAN CYST REMOVAL     x4  . THYROID LOBECTOMY Bilateral    Partial   . TOE FUSION Left     Current Outpatient Prescriptions  Medication Sig Dispense Refill  . albuterol (PROVENTIL HFA;VENTOLIN HFA) 108 (90 Base) MCG/ACT inhaler Inhale 2 puffs into the lungs every 6 (six) hours as needed for wheezing or shortness of breath. 1 Inhaler 2  . budesonide-formoterol (SYMBICORT) 160-4.5 MCG/ACT inhaler Inhale 2 puffs into the lungs 2 (two) times daily. 1 Inhaler 2  . buPROPion (WELLBUTRIN XL) 300 MG 24 hr tablet Take 1 tablet (300 mg total) by mouth daily. 30 tablet 2  . celecoxib (CELEBREX) 200 MG capsule TAKE 1 CAPSULE TWICE A DAY 180 capsule 1  . cyclobenzaprine (FLEXERIL) 10 MG tablet Take 1 tablet (10 mg total) by mouth at bedtime. 90 tablet 3  . dexlansoprazole (DEXILANT) 60 MG capsule Take 60 mg by mouth daily.    [name] Kitchen docusate sodium (COLACE) 50 MG capsule Take 100 mg by mouth  at bedtime.    [name] Kitchen ESTRACE VAGINAL 0.1 MG/GM vaginal cream INSERT 1 GRAM VAGINALLY TWICE A WEEK 42.5 g 2  . levothyroxine (SYNTHROID, LEVOTHROID) 25 MCG tablet TAKE 2 TABLETS (50 MCG TOTAL) BY MOUTH DAILY BEFORE BREAKFAST. 60 tablet 2  . LINZESS 290 MCG CAPS capsule Take 1 capsule by mouth daily.    [name] Kitchen lisdexamfetamine (VYVANSE) 60 MG capsule Take 1 capsule (60 mg total) by mouth every morning. Do not refill until 12/16/16. 30 capsule 0  . triamterene-hydrochlorothiazide (MAXZIDE-25) 37.5-25 MG tablet TAKE 1 TABLET BY MOUTH DAILY. 30 tablet 5   No current facility-administered medications for this visit.     Family History  Problem Relation Age of Onset  . Thyroid disease Mother   . Heart Problems Mother        vavle issue  . Dystonia Mother   . Hypercholesterolemia Mother   . Heart attack Father   . Cancer Father        esophageal  . Heart disease Father   . Lung cancer Paternal Grandfather   . Heart  disease Paternal Grandfather   . Heart disease Maternal Grandfather   . Heart disease Paternal Grandmother   . Heart disease Other        paternal and maternal side    ROS:  Pertinent items are noted in HPI.  Otherwise, a comprehensive ROS was negative.  Exam:   BP 126/80 (BP Location: Right Arm, Patient Position: Sitting, Cuff Size: Normal)   Pulse 88   Resp 16   Ht 5\' 7"  (1.702 m)   Wt 212 lb (96.2 kg)   BMI 33.20 kg/m   Weight change: -2#   Height: 5\' 7"  (170.2 cm)  Ht Readings from Last 3 Encounters:  11/30/16 5\' 7"  (1.702 m)  10/17/16 5' 7.5" (1.715 m)  07/23/16 5' 7.5" (1.715 m)    General appearance: alert, cooperative and appears stated age Head: Normocephalic, without obvious abnormality, atraumatic Neck: no adenopathy, supple, symmetrical, trachea midline and thyroid normal to inspection and palpation Lungs: clear to auscultation bilaterally Breasts: normal appearance, no masses or tenderness Heart: regular rate and rhythm Abdomen: soft, non-tender; bowel sounds normal; no masses,  no organomegaly Extremities: extremities normal, atraumatic, no cyanosis or edema Skin: Skin color, texture, turgor normal. No rashes or lesions Lymph nodes: Cervical, supraclavicular, and axillary nodes normal. No abnormal inguinal nodes palpated Neurologic: Grossly normal   Pelvic: External genitalia:  no lesions              Urethra:  normal appearing urethra with no masses, tenderness or lesions              Bartholins and Skenes: normal                 Vagina: normal appearing vagina with normal color and discharge, no lesions              Cervix: no lesions              Pap taken: No. Bimanual Exam:  Uterus:  normal size, contour, position, consistency, mobility, non-tender              Adnexa: normal adnexa and no mass, fullness, tenderness               Rectovaginal: declines               Anus:  no lesions  Chaperone was present for exam.  A:  Well Woman with normal  exam PMP, no HRT H/O endometrial ablation Prior hx of recurrent BV which resolved with menopause H/O acoustic neuroma excision 2017 with subsequent left hearing loss.  Now has bone anchored hearing aid Balance issues since surgery Atrophic vaginal changes  H/O right thyroid lobectomy Shingles last year  P:   Mammogram due.  Pt aware and states she will call. pap smear not indicated.  Neg pap and neg HR HPV 2017. Estrace vaginal cream 1 gm pv twice weekly.  #42.5gm/2RF Rx for Shingrix given Hep C antibody obtained today return annually or prn

## 2016-12-01 LAB — HEPATITIS C ANTIBODY: Hep C Virus Ab: <0.1 s/co ratio (ref 0.0–0.9)

## 2016-12-30 ENCOUNTER — Other Ambulatory Visit: Payer: Self-pay | Admitting: Physician Assistant

## 2016-12-30 DIAGNOSIS — R0789 Other chest pain: Secondary | ICD-10-CM

## 2016-12-31 ENCOUNTER — Ambulatory Visit (INDEPENDENT_AMBULATORY_CARE_PROVIDER_SITE_OTHER): Payer: BLUE CROSS/BLUE SHIELD | Admitting: Family Medicine

## 2016-12-31 ENCOUNTER — Encounter: Payer: Self-pay | Admitting: Family Medicine

## 2016-12-31 VITALS — BP 124/76 | HR 74 | Ht 68.0 in | Wt 212.0 lb

## 2016-12-31 DIAGNOSIS — R454 Irritability and anger: Secondary | ICD-10-CM | POA: Diagnosis not present

## 2016-12-31 DIAGNOSIS — J4530 Mild persistent asthma, uncomplicated: Secondary | ICD-10-CM

## 2016-12-31 DIAGNOSIS — F900 Attention-deficit hyperactivity disorder, predominantly inattentive type: Secondary | ICD-10-CM | POA: Diagnosis not present

## 2016-12-31 MED ORDER — LISDEXAMFETAMINE DIMESYLATE 60 MG PO CAPS: 60.0000 mg | ORAL_CAPSULE | ORAL | 0 refills | Status: DC

## 2016-12-31 MED ORDER — BUPROPION HCL ER (XL) 300 MG PO TB24: 300.0000 mg | ORAL_TABLET | Freq: Every day | ORAL | 1 refills | Status: DC

## 2016-12-31 NOTE — Progress Notes (Signed)
Subjective:    CC: "ADHD, Asthma   HPI:  F/U ADHD -- Tolerating medication well with no S.E. No CP, SOB, palpitations or insomnia on the medication. Currently on Vyvanse 60 mg daily.  Asthma - when last here was off her controller bc of cost.  She was also having more symptoms than usual. She was recently started on Symbicort and has been doing well on it. It was much more affordable than the Advair. She says that she uses it regularly that she is not using her albuterol nearly as much.   Irritability-she is happy with her Wellbutrin and is requesting refills be sent to her mail order today.  Past medical history, Surgical history, Family history not pertinant except as noted below, Social history, Allergies, and medications have been entered into the medical record, reviewed, and corrections made.   Review of Systems: No fevers, chills, night sweats, weight loss, chest pain, or shortness of breath.   Objective:    General: Well Developed, well nourished, and in no acute distress.  Neuro: Alert and oriented x3, extra-ocular muscles intact, sensation grossly intact.  HEENT: Normocephalic, atraumatic  Skin: Warm and dry, no rashes. Cardiac: Regular rate and rhythm, no murmurs rubs or gallops, no lower extremity edema.  Respiratory: Clear to auscultation bilaterally. Not using accessory muscles, speaking in full sentences.   Impression and Recommendations:    ADHD- Well controlled. Continue current regimen. Follow up in  3 months. 3 rx provided and dated.   Asthma - Stable. continuje current regimen. Splint after couple months if doing well the limit of the step her down to just inhaled corticosteroid. Continue use albuterol as needed.  Irritability-stable on Wellbutrin. [facility]

## 2017-01-05 ENCOUNTER — Other Ambulatory Visit: Payer: Self-pay | Admitting: Physician Assistant

## 2017-01-16 ENCOUNTER — Ambulatory Visit: Payer: BLUE CROSS/BLUE SHIELD | Admitting: Physician Assistant

## 2017-02-01 ENCOUNTER — Other Ambulatory Visit: Payer: Self-pay | Admitting: Internal Medicine

## 2017-02-02 ENCOUNTER — Other Ambulatory Visit: Payer: Self-pay | Admitting: Obstetrics & Gynecology

## 2017-02-28 ENCOUNTER — Other Ambulatory Visit: Payer: Self-pay | Admitting: Physician Assistant

## 2017-02-28 DIAGNOSIS — J4530 Mild persistent asthma, uncomplicated: Secondary | ICD-10-CM

## 2017-03-05 DIAGNOSIS — R1012 Left upper quadrant pain: Secondary | ICD-10-CM | POA: Diagnosis not present

## 2017-03-05 DIAGNOSIS — K219 Gastro-esophageal reflux disease without esophagitis: Secondary | ICD-10-CM | POA: Diagnosis not present

## 2017-03-05 DIAGNOSIS — Z800 Family history of malignant neoplasm of digestive organs: Secondary | ICD-10-CM | POA: Diagnosis not present

## 2017-03-05 DIAGNOSIS — D696 Thrombocytopenia, unspecified: Secondary | ICD-10-CM | POA: Diagnosis not present

## 2017-03-06 ENCOUNTER — Other Ambulatory Visit: Payer: Self-pay | Admitting: Gastroenterology

## 2017-03-06 DIAGNOSIS — R1012 Left upper quadrant pain: Secondary | ICD-10-CM

## 2017-03-06 NOTE — Progress Notes (Signed)
Cynthia Mann MD 

## 2017-03-11 ENCOUNTER — Ambulatory Visit (HOSPITAL_BASED_OUTPATIENT_CLINIC_OR_DEPARTMENT_OTHER)
Admission: RE | Admit: 2017-03-11 | Discharge: 2017-03-11 | Disposition: A | Discharge status: Home / Self Care | Payer: BLUE CROSS/BLUE SHIELD | Source: Ambulatory Visit | Attending: Gastroenterology | Admitting: Gastroenterology

## 2017-03-11 DIAGNOSIS — Z9049 Acquired absence of other specified parts of digestive tract: Secondary | ICD-10-CM | POA: Diagnosis not present

## 2017-03-11 DIAGNOSIS — R1012 Left upper quadrant pain: Secondary | ICD-10-CM | POA: Diagnosis not present

## 2017-03-11 DIAGNOSIS — K760 Fatty (change of) liver, not elsewhere classified: Secondary | ICD-10-CM | POA: Diagnosis not present

## 2017-03-11 DIAGNOSIS — R161 Splenomegaly, not elsewhere classified: Secondary | ICD-10-CM | POA: Diagnosis not present

## 2017-03-18 DIAGNOSIS — H9042 Sensorineural hearing loss, unilateral, left ear, with unrestricted hearing on the contralateral side: Secondary | ICD-10-CM | POA: Diagnosis not present

## 2017-03-20 ENCOUNTER — Other Ambulatory Visit: Payer: Self-pay | Admitting: Physician Assistant

## 2017-03-20 DIAGNOSIS — I10 Essential (primary) hypertension: Secondary | ICD-10-CM

## 2017-03-26 ENCOUNTER — Ambulatory Visit (INDEPENDENT_AMBULATORY_CARE_PROVIDER_SITE_OTHER): Payer: BLUE CROSS/BLUE SHIELD | Admitting: Physician Assistant

## 2017-03-26 ENCOUNTER — Encounter: Payer: Self-pay | Admitting: Physician Assistant

## 2017-03-26 VITALS — BP 120/80 | HR 83 | Ht 68.0 in | Wt 212.0 lb

## 2017-03-26 DIAGNOSIS — J4530 Mild persistent asthma, uncomplicated: Secondary | ICD-10-CM | POA: Diagnosis not present

## 2017-03-26 DIAGNOSIS — F900 Attention-deficit hyperactivity disorder, predominantly inattentive type: Secondary | ICD-10-CM | POA: Diagnosis not present

## 2017-03-26 DIAGNOSIS — Z23 Encounter for immunization: Secondary | ICD-10-CM | POA: Diagnosis not present

## 2017-03-26 DIAGNOSIS — K760 Fatty (change of) liver, not elsewhere classified: Secondary | ICD-10-CM | POA: Insufficient documentation

## 2017-03-26 DIAGNOSIS — M5416 Radiculopathy, lumbar region: Secondary | ICD-10-CM

## 2017-03-26 DIAGNOSIS — E669 Obesity, unspecified: Secondary | ICD-10-CM | POA: Diagnosis not present

## 2017-03-26 DIAGNOSIS — I10 Essential (primary) hypertension: Secondary | ICD-10-CM | POA: Diagnosis not present

## 2017-03-26 DIAGNOSIS — N2889 Other specified disorders of kidney and ureter: Secondary | ICD-10-CM | POA: Diagnosis not present

## 2017-03-26 MED ORDER — LISDEXAMFETAMINE DIMESYLATE 60 MG PO CAPS: 60.0000 mg | ORAL_CAPSULE | ORAL | 0 refills | Status: DC

## 2017-03-26 MED ORDER — CYCLOBENZAPRINE HCL 10 MG PO TABS: 10.0000 mg | ORAL_TABLET | Freq: Every day | ORAL | 3 refills | Status: DC

## 2017-03-26 MED ORDER — TRIAMTERENE-HCTZ 37.5-25 MG PO TABS: 1.0000 | ORAL_TABLET | Freq: Every day | ORAL | 1 refills | Status: DC

## 2017-03-26 MED ORDER — BUDESONIDE-FORMOTEROL FUMARATE 160-4.5 MCG/ACT IN AERO: INHALATION_SPRAY | RESPIRATORY_TRACT | 5 refills | Status: DC

## 2017-03-26 NOTE — Progress Notes (Signed)
Subjective:    Patient ID: Cynthia [name], female    DOB: 1960/08/02, 56 y.o.   MRN: [id number]  HPI Pt is a 56 yo female who presents to the clinic for 3 month ADHD follow up.   ADHD- doing great. No problems or concerns. Doing well at work and staying focused. No side effects.   HTN- doing well today. No CP, palpitations, headaches, vision changes. Taking medication daily.   Asthma- doing great on symbicort. Taking daily. No problems or recent exacerbation.   She was given flexeril for her left lumbar radiculopathy and needs refill for as needed usage.   She was seen by Dr. Collene Mares, GI and found left renal benign mass. She has an upcoming endoscopy due to pt's brother being dx with esophageal cancer at 65 and father dying from esophageal cancer.   Pt is frustrated with her weight. She has tried phentermine, qsymia, contrave with some benefit but not on anything at this time. She is working out 3-4 times a week. She really doesn't have an appetite.   .. Active Ambulatory Problems    Diagnosis Date Noted  . Iron deficiency anemia, unspecified 02/18/2013  . IFG (impaired fasting glucose) 09/13/2013  . Wheezing 09/13/2013  . Swelling of limb 09/13/2013  . Obesity, unspecified 09/13/2013  . Interdigital neuralgia 04/14/2013  . 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 ADD (attention deficit disorder) 05/11/2014  . Low back pain 05/11/2014  . Chronic constipation 05/11/2014  . Essential (primary) hypertension 05/11/2014  . Gastro-esophageal reflux disease without esophagitis 05/11/2014  . Asthma, mild persistent 05/11/2014  . 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 Hypokalemia 02/13/2015  . 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 Thyroid adenoma 05/04/2015  . Hashimoto's thyroiditis 06/14/2015  . Subconjunctival hemorrhage of right eye 06/28/2015  . Acoustic neuroma ([place]) 12/10/2014  . Left ear hearing loss 10/14/2015  . 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 Deafness in left ear  12/06/2015  . Costochondritis 03/21/2016  . Left lumbar radiculopathy 09/19/2016  . Irritable 10/17/2016  . Fatty liver disease, nonalcoholic 72/53/6644  . Left renal mass 03/26/2017   Resolved Ambulatory Problems    Diagnosis Date Noted  . Backache 09/13/2013  . Thyromegaly 02/13/2015   Past Medical History:  Diagnosis Date  . Acoustic neuroma ([place])   . ADD (attention deficit disorder)   . Asthma   . Awareness under anesthesia   . Balance problem   . Benign brain tumor ([place])   . Chronic back pain   . Constipation, chronic   . Diabetes mellitus   . Difficult airway   . 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  . GERD (gastroesophageal reflux disease)   . Iron deficiency anemia, unspecified 02/18/2013  . Obesity   . Tendonitis, Achilles, left    .[name] Kitchen Family History  Problem Relation Age of Onset  . Thyroid disease Mother   . Heart Problems Mother        vavle issue  . Dystonia Mother   . Hypercholesterolemia Mother   . Heart attack Father   . Cancer Father        esophageal  . Heart disease Father   . Lung cancer Paternal Grandfather   . Heart disease Paternal Grandfather   . Heart disease Maternal Grandfather   . Heart disease Paternal Grandmother   . Heart disease Other        paternal and maternal side  . Cancer Brother        esophageal/stomach cancer      Review  of Systems  All other systems reviewed and are negative.      Objective:   Physical Exam  Constitutional: She is oriented to person, place, and time. She appears well-developed and well-nourished.  Obese.   HENT:  Head: Normocephalic and atraumatic.  Cardiovascular: Normal rate, regular rhythm and normal heart sounds.   Pulmonary/Chest: Effort normal and breath sounds normal.  Neurological: She is alert and oriented to person, place, and time.  Psychiatric: She has a normal mood and affect. Her behavior is normal.          Assessment & Plan:  [name] KitchenMarland KitchenKasarah was seen today for adhd.  Diagnoses and all orders for  this visit:  Attention deficit hyperactivity disorder (ADHD), predominantly inattentive type -     Discontinue: lisdexamfetamine (VYVANSE) 60 MG capsule; Take 1 capsule (60 mg total) by mouth every morning. -     Discontinue: lisdexamfetamine (VYVANSE) 60 MG capsule; Take 1 capsule (60 mg total) by mouth every morning. Do not refill until 04/26/17 -     lisdexamfetamine (VYVANSE) 60 MG capsule; Take 1 capsule (60 mg total) by mouth every morning. Do not refill until 05/26/17  Need for shingles vaccine -     Varicella-zoster vaccine IM (Shingrix)  Influenza vaccine needed -     Flu Vaccine QUAD 6+ mos PF IM (Fluarix Quad PF)  Essential (primary) hypertension -     triamterene-hydrochlorothiazide (MAXZIDE-25) 37.5-25 MG tablet; Take 1 tablet by mouth daily.  Mild persistent asthma without complication -     budesonide-formoterol (SYMBICORT) 160-4.5 MCG/ACT inhaler; INHALE 2 PUFFS INTO THE LUNGS 2 TIMES DAILY  Left lumbar radiculopathy -     cyclobenzaprine (FLEXERIL) 10 MG tablet; Take 1 tablet (10 mg total) by mouth at bedtime.  Left renal mass  Obesity (BMI 30.0-34.9)   .[name] Kitchen Depression screen PHQ 2/9 03/26/2017  Decreased Interest 0  Down, Depressed, Hopeless 0  PHQ - 2 Score 0   Refilled medications for 3 months.   .Discussed low carb diet with 1500 calories and 80g of protein.  Exercising at least 150 minutes a week.  [employer] could be a Microbiologist.  Discussed belviq since tried most of the others. hesistent to try saxenda due to personal hx of thyroid cancer. Pt does not want to try medications at this point.   Reassurance given about right renal mass of benign nature.   [name] Kitchen.Spent 30 minutes with patient and greater than 50 percent of visit spent counseling patient regarding treatment plan.

## 2017-03-27 ENCOUNTER — Other Ambulatory Visit: Payer: Self-pay | Admitting: Physician Assistant

## 2017-03-27 DIAGNOSIS — J4530 Mild persistent asthma, uncomplicated: Secondary | ICD-10-CM

## 2017-03-29 DIAGNOSIS — K449 Diaphragmatic hernia without obstruction or gangrene: Secondary | ICD-10-CM | POA: Diagnosis not present

## 2017-03-29 DIAGNOSIS — R1012 Left upper quadrant pain: Secondary | ICD-10-CM | POA: Diagnosis not present

## 2017-03-29 DIAGNOSIS — K219 Gastro-esophageal reflux disease without esophagitis: Secondary | ICD-10-CM | POA: Diagnosis not present

## 2017-04-02 ENCOUNTER — Ambulatory Visit: Payer: BLUE CROSS/BLUE SHIELD | Admitting: Physician Assistant

## 2017-04-27 ENCOUNTER — Other Ambulatory Visit: Payer: Self-pay | Admitting: Internal Medicine

## 2017-05-22 ENCOUNTER — Ambulatory Visit: Payer: BLUE CROSS/BLUE SHIELD | Admitting: Physician Assistant

## 2017-05-22 ENCOUNTER — Encounter: Payer: Self-pay | Admitting: Physician Assistant

## 2017-05-22 VITALS — BP 133/71 | HR 87 | Ht 68.0 in | Wt 213.0 lb

## 2017-05-22 DIAGNOSIS — S4991XA Unspecified injury of right shoulder and upper arm, initial encounter: Secondary | ICD-10-CM | POA: Diagnosis not present

## 2017-05-22 DIAGNOSIS — M79621 Pain in right upper arm: Secondary | ICD-10-CM

## 2017-05-22 NOTE — Progress Notes (Addendum)
Subjective:    Patient ID: Cynthia [name], female    DOB: [birth year], 56 y.o.   MRN: [id number]  HPI Patient is a 56 y/o female who presents today for pain in her right upper extremity after a flu shot 8 weeks ago.    She describes it as an achy pain that has been constantly present for last 8 weeks. She reports that the pain is worse at the end of the day after use, radiates to her elbow, and has worsened in severity over the past 8 weeks. She is unable to sleep on her right side because it is tender to the touch, however has continued kickboxing twice a week and working out.She has tried Celebrex, Tylenol, icing and applying heat to the area with minimal relief. She reports numbness and tingling after sleeping on the right arm, however denies warmth, erythema, swelling or weakness of the right arm.  .. Active Ambulatory Problems    Diagnosis Date Noted  . Iron deficiency anemia, unspecified 02/18/2013  . IFG (impaired fasting glucose) 09/13/2013  . Wheezing 09/13/2013  . Swelling of limb 09/13/2013  . Obesity, unspecified 09/13/2013  . Interdigital neuralgia 04/14/2013  . 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 ADD (attention deficit disorder) 05/11/2014  . Low back pain 05/11/2014  . Chronic constipation 05/11/2014  . Essential (primary) hypertension 05/11/2014  . Gastro-esophageal reflux disease without esophagitis 05/11/2014  . Asthma, mild persistent 05/11/2014  . 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 Hypokalemia 02/13/2015  . 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 Thyroid adenoma 05/04/2015  . Hashimoto's thyroiditis 06/14/2015  . Subconjunctival hemorrhage of right eye 06/28/2015  . Acoustic neuroma ([place]) 12/10/2014  . Left ear hearing loss 10/14/2015  . 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 Costochondritis 03/21/2016  . Left lumbar radiculopathy 09/19/2016  . Irritable 10/17/2016  . Fatty liver disease, nonalcoholic 12/45/8099  .  Left renal mass 03/26/2017  . Obesity (BMI 30.0-34.9) 03/26/2017   Resolved Ambulatory Problems    Diagnosis Date Noted  . Backache 09/13/2013  . Thyromegaly 02/13/2015   Past Medical History:  Diagnosis Date  . Acoustic neuroma ([place])   . ADD (attention deficit disorder)   . Asthma   . Awareness under anesthesia   . Balance problem   . Benign brain tumor ([place])   . Chronic back pain   . Constipation, chronic   . Diabetes mellitus   . Difficult airway   . 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  . GERD (gastroesophageal reflux disease)   . Iron deficiency anemia, unspecified 02/18/2013  . Obesity   . Tendonitis, Achilles, left       Review of Systems See HPI. All other systems reviewed and are negative    Objective:   Physical Exam  Constitutional: She is oriented to person, place, and time. She appears well-developed and well-nourished.  Musculoskeletal:       Right shoulder: She exhibits normal range of motion, no tenderness, no swelling, no deformity and normal strength.       Right elbow: She exhibits normal range of motion, no swelling and no deformity. No tenderness found.  Full active and passive ROM of bilateral extremities. Localized tenderness to palpation of her right bicep. Grip strength is normal bilaterally. Strength of bilateral upper extremities 5/5 but patient experienced pain with resisted flexion of her right bicep.  Neurological: She is alert and oriented to person, place, and time.  Skin: Skin is warm and dry. No ecchymosis and  no rash noted. No erythema.  Psychiatric: She has a normal mood and affect. Her behavior is normal. Thought content normal.  Vitals reviewed.      Assessment & Plan:  [name] KitchenMarland KitchenLaresha was seen today for right deltoid pain.  Diagnoses and all orders for this visit:  Pain in right upper arm -     MR HUMERUS RIGHT WO CONTRAST  Soft tissue injury of right upper arm, initial encounter   Pain in right upper arm - Due to the duration of the patients  symptoms, localized tenderness over her right bicep, and increasing pain with use, I suspect that her symptoms are related to a soft tissue injury.  Will obtain an MRI of her right upper extremity. Discussed with patient stretching and strengthening exercises that she can try for relief of symptoms.

## 2017-05-24 ENCOUNTER — Telehealth: Payer: Self-pay

## 2017-05-24 NOTE — Telephone Encounter (Signed)
Patient requested a MRI before the snow.

## 2017-05-24 NOTE — Telephone Encounter (Signed)
Placed order. Pt would like to be done ASAP before the snow.

## 2017-05-24 NOTE — Telephone Encounter (Signed)
Pt scheduled  

## 2017-05-25 DIAGNOSIS — M19011 Primary osteoarthritis, right shoulder: Secondary | ICD-10-CM | POA: Diagnosis not present

## 2017-05-25 DIAGNOSIS — M79621 Pain in right upper arm: Secondary | ICD-10-CM | POA: Diagnosis not present

## 2017-05-28 ENCOUNTER — Other Ambulatory Visit: Payer: Self-pay | Admitting: Physician Assistant

## 2017-05-28 MED ORDER — PREDNISONE 50 MG PO TABS: ORAL_TABLET | ORAL | 0 refills | Status: DC

## 2017-05-28 NOTE — Progress Notes (Signed)
MRI results from [facility] for 8 weeks right proximal upper ext pain.  NO findings identified to patient's pain.  Did see a subchondral cyst of right shoulder.   Consulted Dr. Georgina Snell. Suggested prednisone burst to see if radial nerve could be aggravated and follow up with him if not improving.

## 2017-05-30 ENCOUNTER — Encounter: Payer: Self-pay | Admitting: Osteopathic Medicine

## 2017-05-30 ENCOUNTER — Ambulatory Visit (INDEPENDENT_AMBULATORY_CARE_PROVIDER_SITE_OTHER): Payer: BLUE CROSS/BLUE SHIELD | Admitting: Osteopathic Medicine

## 2017-05-30 VITALS — BP 125/80 | HR 86 | Temp 97.8°F

## 2017-05-30 DIAGNOSIS — J4540 Moderate persistent asthma, uncomplicated: Secondary | ICD-10-CM

## 2017-05-30 DIAGNOSIS — J208 Acute bronchitis due to other specified organisms: Secondary | ICD-10-CM

## 2017-05-30 MED ORDER — BENZONATATE 200 MG PO CAPS: 200.0000 mg | ORAL_CAPSULE | Freq: Three times a day (TID) | ORAL | 1 refills | Status: DC | PRN

## 2017-05-30 MED ORDER — AZITHROMYCIN 250 MG PO TABS: ORAL_TABLET | ORAL | 0 refills | Status: DC

## 2017-05-30 NOTE — Patient Instructions (Signed)
Over-the-Counter Medications & Home Remedies for Upper Respiratory Illness  Note: the following list assumes no pregnancy, normal liver & kidney function and no other drug interactions. Dr. Sheppard Coil has highlighted medications which are safe for you to use, but these may not be appropriate for everyone. Always ask a pharmacist or qualified medical provider if you have any questions!   Aches/Pains, Fever, Headache Acetaminophen (Tylenol) 500 mg tablets - take max 2 tablets (1000 mg) every 6 hours (4 times per day)  Ibuprofen (Motrin) 200 mg tablets - take max 4 tablets (800 mg) every 6 hours*  Sinus Congestion Afrin limited use to prevent rebound congestion - DO NOT use in kids  Nasal Saline if desired Phenylephrine (Sudafed) 10 mg tablets every 4 hours (or the 12-hour formulation)* Diphenhydramine (Benadryl) 25 mg tablets - take max 2 tablets every 4 hours  Cough & Sore Throat Prescription cough pills or syrups as directed Dextromethorphan (Robitussin, others) - cough suppressant Guaifenesin (Robitussin, Mucinex, others) - expectorant (helps cough up mucus) (Dextromethorphan and Guaifenesin also come in a combination tablet) Lozenges w/ Benzocaine + Menthol (Cepacol) Honey - as much as you want! Teas which "coat the throat" - look for ingredients Elm Bark, Licorice Root, Marshmallow Root  Other Antibiotics if these are prescribed - take ALL, even if you're feeling better  Zinc Lozenges within 24 hours of symptoms onset - mixed evidence this shortens the duration of the common cold Don't waste your money on Vitamin C or Echinacea  *Caution in patients with high blood pressure

## 2017-05-30 NOTE — Progress Notes (Signed)
HPI: Cynthia [name] is a 56 y.o. 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  she presents to [facility] today, 05/30/17,  for chief complaint of:  Chief Complaint  Patient presents with  . Cough    Since Sunday feeling sick with sore throat and fatigue a slight cough, coughing worse past few days. Mucinex not helpful, she is out of Tessalon. She has asthma, compliant with inhaled meds as below, just started prednisone burst for separate issue, no wheezing, no SOB     Past medical, surgical, social and family history reviewed:  Patient Active Problem List   Diagnosis Date Noted  . Fatty liver disease, nonalcoholic 16/03/9603  . Left renal mass 03/26/2017  . Obesity (BMI 30.0-34.9) 03/26/2017  . Irritable 10/17/2016  . Left lumbar radiculopathy 09/19/2016  . Costochondritis 03/21/2016  . 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 Left ear hearing loss 10/14/2015  . Subconjunctival hemorrhage of right eye 06/28/2015  . Hashimoto's thyroiditis 06/14/2015  . Thyroid adenoma 05/04/2015  . 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 Hypokalemia 02/13/2015  . 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 Acoustic neuroma ([place]) 12/10/2014  . 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 ADD (attention deficit disorder) 05/11/2014  . Low back pain 05/11/2014  . Chronic constipation 05/11/2014  . Essential (primary) hypertension 05/11/2014  . Gastro-esophageal reflux disease without esophagitis 05/11/2014  . Asthma, mild persistent 05/11/2014  . IFG (impaired fasting glucose)  09/13/2013  . Wheezing 09/13/2013  . Swelling of limb 09/13/2013  . Obesity, unspecified 09/13/2013  . Interdigital neuralgia 04/14/2013  . Iron deficiency anemia, unspecified 02/18/2013    Past Surgical History:  Procedure Laterality Date  . ABLATION     and bladder sling  . ACHILLES TENDON REPAIR     x2  . ANAL FISSURE REPAIR  2012  . BACK SURGERY    . CHOLECYSTECTOMY    . CRANIOTOMY  09/13/2015   with excision of acoustic neuroma  . IMPLANTATION BONE ANCHORED HEARING AID    . MOUTH SURGERY  2015   3 implants  . OVARIAN CYST REMOVAL     x4  . THYROID LOBECTOMY Right 04/28/2015   Partial   . TOE FUSION Left     Social History   Tobacco Use  . Smoking status: Never Smoker  . Smokeless tobacco: Never Used  Substance Use Topics  . Alcohol use: Yes    Alcohol/week: 0.5 - 1.0 oz    Types: 1 - 2 Standard drinks or equivalent per week    Family History  Problem Relation Age of Onset  . Thyroid disease Mother   . Heart Problems Mother        vavle issue  . Dystonia Mother   . Hypercholesterolemia Mother   . Heart attack Father   . Cancer Father        esophageal  . Heart disease Father   . Lung cancer Paternal Grandfather   . Heart disease Paternal Grandfather   . Heart disease Maternal Grandfather   . Heart disease Paternal Grandmother   . Heart disease Other        paternal and maternal side  . Cancer Brother  esophageal/stomach cancer     Current medication list and allergy/intolerance information reviewed:    Current Outpatient Medications  Medication Sig Dispense Refill  . albuterol (PROVENTIL HFA;VENTOLIN HFA) 108 (90 Base) MCG/ACT inhaler Inhale 2 puffs into the lungs every 6 (six) hours as needed for wheezing or shortness of breath. 1 Inhaler 2  . budesonide-formoterol (SYMBICORT) 160-4.5 MCG/ACT inhaler INHALE 2 PUFFS INTO THE LUNGS 2 TIMES DAILY 10.2 Inhaler 5  . buPROPion (WELLBUTRIN XL) 300 MG 24 hr tablet Take 1 tablet (300 mg total) by  mouth daily. 90 tablet 1  . celecoxib (CELEBREX) 200 MG capsule TAKE 1 CAPSULE TWICE A DAY 180 capsule 1  . cyclobenzaprine (FLEXERIL) 10 MG tablet Take 1 tablet (10 mg total) by mouth at bedtime. 90 tablet 3  . dexlansoprazole (DEXILANT) 60 MG capsule Take 60 mg by mouth daily.    [name] Kitchen docusate sodium (COLACE) 50 MG capsule Take 100 mg by mouth at bedtime.    [name] Kitchen estradiol (ESTRACE VAGINAL) 0.1 MG/GM vaginal cream 1 gram pv twice weekly. 42.5 g 2  . levothyroxine (SYNTHROID, LEVOTHROID) 25 MCG tablet TAKE 2 TABLETS BEFORE BREAKFAST ON EMPTY STOMACH 60 tablet 2  . LINZESS 290 MCG CAPS capsule Take 1 capsule by mouth daily.    [name] Kitchen lisdexamfetamine (VYVANSE) 60 MG capsule Take 1 capsule (60 mg total) by mouth every morning. Do not refill until 05/26/17 30 capsule 0  . predniSONE (DELTASONE) 50 MG tablet One tab PO daily for 5 days. 5 tablet 0  . triamterene-hydrochlorothiazide (MAXZIDE-25) 37.5-25 MG tablet Take 1 tablet by mouth daily. 90 tablet 1   No current facility-administered medications for this visit.     Allergies  Allergen Reactions  . Mobic [Meloxicam] Nausea And Vomiting  . Multivitamins Other (See Comments)      Review of Systems:  Constitutional:  No  fever, +chills, No recent illness, No unintentional weight changes. +significant fatigue.   HEENT: +mild headache, no vision change, no hearing change, +sore throat, +sinus pressure  Cardiac: No  chest pain, No  pressure, No palpitations  Respiratory:  No  shortness of breath. +Cough  Gastrointestinal: No  abdominal pain, No  nausea, No  vomiting,  No  blood in stool, No  diarrhea, No  constipation   Skin: No  Rash  Neurologic: No  weakness, No  dizziness,   Exam:  BP 125/80   Pulse 86   Temp 97.8 F (36.6 C) (Oral)   SpO2 100%   Constitutional: VS see above. General Appearance: alert, well-developed, well-nourished, NAD  Eyes: Normal lids and conjunctive, non-icteric sclera  Ears, Nose, Mouth, Throat: MMM, Normal  external inspection ears/nares/mouth/lips/gums. TM normal bilaterally. Pharynx/tonsils mild erythema, no exudate. Nasal mucosa normal.   Neck: No masses, trachea midline. No thyroid enlargement. No tenderness/mass appreciated. No lymphadenopathy  Respiratory: Normal respiratory effort. no wheeze, no rhonchi, no rales  Cardiovascular: S1/S2 normal, no murmur, no rub/gallop auscultated. RRR.      ASSESSMENT/PLAN:   Acute viral bronchitis - abx rx printed in case worse, already on day 2 steroids, conitnue this, lungs sound good no wheezing, refilled cough meds, RTC consider XR if worse  Moderate persistent asthma, unspecified whether complicated - no wheezing, already on steroids, monitor, conitnue inhalers     Patient Instructions  Over-the-Counter Medications & Home Remedies for Upper Respiratory Illness  Note: the following list assumes no pregnancy, normal liver & kidney function and no other drug interactions. Dr. Sheppard Coil has highlighted medications which are safe for you  to use, but these may not be appropriate for everyone. Always ask a pharmacist or qualified medical provider if you have any questions!   Aches/Pains, Fever, Headache Acetaminophen (Tylenol) 500 mg tablets - take max 2 tablets (1000 mg) every 6 hours (4 times per day)  Ibuprofen (Motrin) 200 mg tablets - take max 4 tablets (800 mg) every 6 hours*  Sinus Congestion Afrin limited use to prevent rebound congestion - DO NOT use in kids  Nasal Saline if desired Phenylephrine (Sudafed) 10 mg tablets every 4 hours (or the 12-hour formulation)* Diphenhydramine (Benadryl) 25 mg tablets - take max 2 tablets every 4 hours  Cough & Sore Throat Prescription cough pills or syrups as directed Dextromethorphan (Robitussin, others) - cough suppressant Guaifenesin (Robitussin, Mucinex, others) - expectorant (helps cough up mucus) (Dextromethorphan and Guaifenesin also come in a combination tablet) Lozenges w/ Benzocaine +  Menthol (Cepacol) Honey - as much as you want! Teas which "coat the throat" - look for ingredients Elm Bark, Licorice Root, Marshmallow Root  Other Antibiotics if these are prescribed - take ALL, even if you're feeling better  Zinc Lozenges within 24 hours of symptoms onset - mixed evidence this shortens the duration of the common cold Don't waste your money on Vitamin C or Echinacea  *Caution in patients with high blood pressure       Visit summary with medication list and pertinent instructions was printed for patient to review. All questions at time of visit were answered - patient instructed to contact office with any additional concerns. ER/RTC precautions were reviewed with the patient. Follow-up plan: Return if symptoms worsen or fail to improve.    Please note: voice recognition software was used to produce this document, and typos may escape review. Please contact Dr. Sheppard Coil for any needed clarifications.

## 2017-06-09 ENCOUNTER — Other Ambulatory Visit: Payer: Self-pay | Admitting: Physician Assistant

## 2017-06-09 ENCOUNTER — Other Ambulatory Visit: Payer: Self-pay | Admitting: Family Medicine

## 2017-06-09 DIAGNOSIS — R454 Irritability and anger: Secondary | ICD-10-CM

## 2017-06-09 DIAGNOSIS — R0789 Other chest pain: Secondary | ICD-10-CM

## 2017-06-14 ENCOUNTER — Ambulatory Visit (INDEPENDENT_AMBULATORY_CARE_PROVIDER_SITE_OTHER): Payer: BLUE CROSS/BLUE SHIELD | Admitting: Family Medicine

## 2017-06-14 ENCOUNTER — Encounter: Payer: Self-pay | Admitting: Family Medicine

## 2017-06-14 VITALS — BP 128/84 | HR 75 | Ht 68.0 in | Wt 215.0 lb

## 2017-06-14 DIAGNOSIS — G5631 Lesion of radial nerve, right upper limb: Secondary | ICD-10-CM | POA: Diagnosis not present

## 2017-06-14 MED ORDER — PREDNISONE 5 MG (48) PO TBPK: ORAL_TABLET | ORAL | 0 refills | Status: DC

## 2017-06-14 MED ORDER — PREGABALIN 75 MG PO CAPS: 75.0000 mg | ORAL_CAPSULE | Freq: Two times a day (BID) | ORAL | 3 refills | Status: DC

## 2017-06-14 NOTE — Patient Instructions (Signed)
Thank you for coming in today. You should hear about the Nerve Study soon.  Let me know if you do not hear anything in 1 week.  Start lyrica twice daily.  Take prednisone for 12 days.  Recheck in about 1 month.    Radial Tunnel Syndrome  Radial tunnel syndrome happens when the nerve that extends from the back of your upper arm to your forearm (radial nerve) gets squeezed (compressed). The condition is usually caused by inflammation, an injury, or a tumor that puts pressure on the nerve and traps it. Radial tunnel syndrome can cause stabbing pain in the hand and arm. What are the causes? This condition may be caused by:  Repeatedly using your forearm too much, especially to twist or extend your arm.  Muscle inflammation.  An injury.  A mass of nerve tissue (ganglia).  Noncancerous fatty tumors (lipomas).  A bone tumor.  What increases the risk? This condition is more likely to develop in people who:  Play sports that involve moving their forearm a lot, such as tennis.  Have a job that involves using their forearm a lot, such as some factory jobs.  What are the signs or symptoms? Symptoms of this condition include:  Aching pain on the top of the forearm, back of the hand, or side of the elbow.  Pain while straightening the wrist or fingers.  Weakness in the forearm muscles and wrist.  How is this diagnosed? This condition may be diagnosed based on your symptoms, your medical history, and a physical exam. During the exam, you may be asked to move your hand, fingers, wrist, and arm in certain ways so your health care provider can find the source of your pain. Your health care provider may also order one or more tests to rule out other conditions. The tests may include:  An electromyogram (EMG). This test can show how well the radial nerve is working.  A nerve conduction study. This test measures how well electrical signals pass through your nerves.  An MRI. This test  checks for causes of nerve problems, such as scarring from an injury, pressure on a nerve, or a tumor.  An ultrasound. This test can show certain injuries, such as tears to ligaments or tendons.  How is this treated? Treatment for this condition may include:  Avoiding activities that cause your symptoms to get worse or flare up.  Taking steroids or anti-inflammatory medicines, like ibuprofen, to help with symptoms.  Wearing a splint or brace for support until your symptoms go away.  Doing exercises to regain strength and maintain movement and range of motion in your hand and arm.  Surgery to release the radial nerve.  Usually, surgery is not needed unless other treatments fail and symptoms last longer than 3 months. Follow these instructions at home: If You Have a Splint or Brace:  Wear it as told by your health care provider. Remove it only as told by your health care provider.  Loosen it if your fingers tingle, become numb, or turn cold and blue.  Do not let it get wet if it is not waterproof.  Keep it clean. Activity  Return to your normal activities as told by your health care provider. Ask your health care provider what activities are safe for you.  Do exercises as told by your health care provider. General Instructions  Do not use any tobacco products, including cigarettes, chewing tobacco, or e-cigarettes. If you need help quitting, ask your health care provider.  Take over-the-counter and prescription medicines only as told by your health care provider.  Keep all follow-up visits as told by your health care provider. This is important. How is this prevented?  Warm up and stretch before being active.  Cool down and stretch after being active.  Give your body time to rest between periods of activity.  Make sure to use equipment that fits you.  Be safe and responsible while being active to avoid falls.  Do at least 150 minutes of moderate-intensity exercise  each week, such as brisk walking or water aerobics.  Maintain physical fitness, including: ? Strength. ? Flexibility. ? Cardiovascular fitness. ? Endurance. Contact a health care provider if:  Your symptoms do not improve within 12 weeks.  Your symptoms get worse.  Your wrist gets weak or droopy. Get help right away if:  Your pain is severe.  You cannot move part of your hand or arm. This information is not intended to replace advice given to you by your health care provider. Make sure you discuss any questions you have with your health care provider. Document Released: 06/04/2005 Document Revised: 02/10/2016 Document Reviewed: 04/30/2015 Elsevier Interactive Patient Education  [employer].

## 2017-06-14 NOTE — Progress Notes (Signed)
Subjective:    I'm seeing this patient as a consultation for:  Donella Stade, PA-C   CC: Right arm pain  HPI: Janay notes continued pain in her right arm following a flu vaccine administration several months ago.  He received a flu shot in early October and has had persistent pain in her upper arm and dorsal forearm on the right side.  She denies any weakness or tingling pain fevers or chills.  She has been seen by her primary care provider who was obtained an x-ray of the arm which was unremarkable and a MRI of the humerus which was unremarkable for area where she is having pain.  She has the pain is persistent and obnoxious and interfering with her quality of life.  She has had to reduce her exercise due to pain.  She notes that she also has pain at night.  Past medical history, Surgical history, Family history not pertinant except as noted below, Social history, Allergies, and medications have been entered into the medical record, reviewed, and no changes needed.   Review of Systems: No headache, visual changes, nausea, vomiting, diarrhea, constipation, dizziness, abdominal pain, skin rash, fevers, chills, night sweats, weight loss, swollen lymph nodes, body aches, joint swelling, muscle aches, chest pain, shortness of breath, mood changes, visual or auditory hallucinations.   Objective:    Vitals:   06/14/17 1139  BP: 128/84  Pulse: 75   General: Well Developed, well nourished, and in no acute distress.  Neuro/Psych: Alert and oriented x3, extra-ocular muscles intact, able to move all 4 extremities, sensation grossly intact. Skin: Warm and dry, no rashes noted.  Respiratory: Not using accessory muscles, speaking in full sentences, trachea midline.  Cardiovascular: Pulses palpable, no extremity edema. Abdomen: Does not appear distended. MSK:  C-spine nontender to spinal midline normal neck motion. Right shoulder exam normal-appearing nontender normal motion normal  strength Right upper arm.  Tender palpation at the mid lateral upper arm along the course of the radial nerve. Elbow normal-appearing nontender normal motion and strength. Right wrist normal-appearing nontender normal strength. Neuro exam reveals normal intact strength sensation pulses and capillary refill bilateral upper extremities  IMPRESSION:  No findings identified to account for the patient's proximal right upper extremity pain.   Electronically Signed by: Levy Sjogren  Result Narrative  Exam date/time:05/25/2017 7:39 AM INDICATION: Pain in right upper arm TECHNIQUE:MRI HUMERUS RIGHT WO IV CONTRAST multiplanar, multisequence imaging of the right humerus was performed without contrast. This study is not optimized for evaluation of the adjacent joints. Lack of contrast decreases sensitivity of detection of  fluid collections or masses. COMPARISON:None.  FINDINGS: No acute fracture.  No marrow signal abnormality.  No suspicious osseous lesion.Multilobulated T2 hyperintense focus identified within the proximal humerus in the region of the lesser tuberosity likely representing a subchondral cyst or intraosseous ganglion.  No visualized focal fluid collections or masses. No muscle signal abnormality.  Minimal AC joint degenerative changes.  Other Result Information  Acute Interface, Incoming Rad Results - 05/27/2017 11:59 AM EST Exam date/time:  05/25/2017 7:39 AM INDICATION: Pain in right upper arm   TECHNIQUE:  MRI HUMERUS RIGHT WO IV CONTRAST multiplanar, multisequence imaging of the right humerus was performed without contrast. This study is not optimized for evaluation of the adjacent joints. Lack of contrast decreases sensitivity of detection of  fluid collections or masses. COMPARISON:  None.  FINDINGS: No acute fracture.  No marrow signal abnormality.  No suspicious osseous lesion.  Multilobulated T2 hyperintense  focus identified within the proximal humerus in  the region of the lesser tuberosity likely representing a subchondral cyst or intraosseous ganglion.  No visualized focal fluid collections or masses. No muscle signal abnormality.  Minimal AC joint degenerative changes.   IMPRESSION:  No findings identified to account for the patient's proximal right upper extremity pain.   Electronically Signed by: Levy Sjogren     Impression and Recommendations:    Assessment and Plan: 56 y.o. female with pain in the right arm is concerning for irritation of the radial nerve at the humerus. This was not see on MRI of the humerus as noted above.  I think it is reasonable to proceed with workup including a nerve conduction study.  Additionally treat empirically with a oral prednisone course and Lyrica.  Recheck in about 4 weeks.   Orders Placed This Encounter  Procedures  . NCV with EMG(electromyography)    Right upper extremity radial nerve issue    Standing Status:   Future    Standing Expiration Date:   06/14/2018    Order Specific Question:   Where should this test be performed?    Answer:   [facility]   Meds ordered this encounter  Medications  . predniSONE (STERAPRED UNI-PAK 48 TAB) 5 MG (48) TBPK tablet    Sig: 12 day dosepack po    Dispense:  48 tablet    Refill:  0  . pregabalin (LYRICA) 75 MG capsule    Sig: Take 1 capsule (75 mg total) by mouth 2 (two) times daily.    Dispense:  60 capsule    Refill:  3    Discussed warning signs or symptoms. Please see discharge instructions. Patient expresses understanding.

## 2017-06-26 ENCOUNTER — Encounter: Payer: Self-pay | Admitting: Physician Assistant

## 2017-06-26 ENCOUNTER — Ambulatory Visit: Payer: BLUE CROSS/BLUE SHIELD | Admitting: Physician Assistant

## 2017-06-26 VITALS — BP 142/76 | HR 83 | Ht 68.0 in | Wt 211.0 lb

## 2017-06-26 DIAGNOSIS — G5631 Lesion of radial nerve, right upper limb: Secondary | ICD-10-CM | POA: Diagnosis not present

## 2017-06-26 DIAGNOSIS — Z23 Encounter for immunization: Secondary | ICD-10-CM | POA: Diagnosis not present

## 2017-06-26 DIAGNOSIS — F900 Attention-deficit hyperactivity disorder, predominantly inattentive type: Secondary | ICD-10-CM | POA: Diagnosis not present

## 2017-06-26 DIAGNOSIS — L299 Pruritus, unspecified: Secondary | ICD-10-CM | POA: Diagnosis not present

## 2017-06-26 MED ORDER — LISDEXAMFETAMINE DIMESYLATE 60 MG PO CAPS: 60.0000 mg | ORAL_CAPSULE | ORAL | 0 refills | Status: DC

## 2017-06-26 NOTE — Progress Notes (Signed)
Subjective:    Patient ID: Cynthia [name], female    DOB: [birth year], 57 y.o.   MRN: [id number]  HPI Pt is a 57 yo female who presents to the clinic for 3 month ADHD refill.   ADHD- doing well no concerns or compliants. No anxiety, palpitations, headaches, insomnia. Needs refills.   She does c/o intermittent ear itching inside ear. No pain. No discharge. No hearing loss or popping. Not done anything to make better.   Right compression of radial nerve has resolved on prednisone. She still has a few days left. She stopped lyrica because she did not like it. She has not done EMG's.   .. Active Ambulatory Problems    Diagnosis Date Noted  . Iron deficiency anemia, unspecified 02/18/2013  . IFG (impaired fasting glucose) 09/13/2013  . Wheezing 09/13/2013  . Swelling of limb 09/13/2013  . Obesity, unspecified 09/13/2013  . Interdigital neuralgia 04/14/2013  . 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 ADD (attention deficit disorder) 05/11/2014  . Low back pain 05/11/2014  . Chronic constipation 05/11/2014  . Essential (primary) hypertension 05/11/2014  . Gastro-esophageal reflux disease without esophagitis 05/11/2014  . Asthma, mild persistent 05/11/2014  . 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 Hypokalemia 02/13/2015  . 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 Thyroid adenoma 05/04/2015  . Hashimoto's thyroiditis 06/14/2015  . Subconjunctival hemorrhage of right eye 06/28/2015  . Acoustic neuroma ([place]) 12/10/2014  . Left ear hearing loss 10/14/2015  . 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 Costochondritis 03/21/2016  . Left lumbar radiculopathy 09/19/2016  . Irritable 10/17/2016  . Fatty liver disease, nonalcoholic 16/12/3708  . Left renal mass 03/26/2017  . Obesity (BMI 30.0-34.9) 03/26/2017   Resolved Ambulatory Problems    Diagnosis Date Noted  . Backache 09/13/2013  . Thyromegaly 02/13/2015   Past Medical History:  Diagnosis Date  .  Acoustic neuroma ([place])   . ADD (attention deficit disorder)   . Asthma   . Awareness under anesthesia   . Balance problem   . 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 Chronic back pain   . Constipation, chronic   . Diabetes mellitus   . Difficult airway   . 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  . GERD (gastroesophageal reflux disease)   . Iron deficiency anemia, unspecified 02/18/2013  . Obesity   . Tendonitis, Achilles, left       Review of Systems  All other systems reviewed and are negative.      Objective:   Physical Exam  Constitutional: She is oriented to person, place, and time. She appears well-developed and well-nourished.  HENT:  Head: Normocephalic and atraumatic.  Right Ear: External ear normal.  Left Ear: External ear normal.  TM's clear bilaterally.   Cardiovascular: Normal rate, regular rhythm and normal heart sounds.  Pulmonary/Chest: Effort normal and breath sounds normal. She has no wheezes.  Neurological: She is alert and oriented to person, place, and time.  Psychiatric: She has a normal mood and affect. Her behavior is normal.          Assessment & Plan:  Cynthia [name] KitchenMarland KitchenLoghan was seen today for adhd.  Diagnoses and all orders for this visit:  Attention deficit hyperactivity disorder (ADHD), predominantly inattentive type -     lisdexamfetamine (VYVANSE) 60 MG capsule; Take 1 capsule (60 mg total) by mouth every morning.  Ear itching  Need for shingles vaccine -     Varicella-zoster vaccine IM (Shingrix)  Compression of right radial nerve  Refilled vyvanse for 3 months.  No concerns.   Reassurance given that ears look normal. Suggest tea tree oil for moisturizer. Follow up as needed.   Radial nerve compression has resolved. Get EMG's if nerve pain comes back after stopping prednisone. Follow up as needed.

## 2017-07-18 ENCOUNTER — Encounter: Payer: Self-pay | Admitting: Family Medicine

## 2017-07-18 ENCOUNTER — Ambulatory Visit: Payer: BLUE CROSS/BLUE SHIELD | Admitting: Family Medicine

## 2017-07-18 VITALS — BP 138/89 | HR 92 | Ht 68.0 in | Wt 216.0 lb

## 2017-07-18 DIAGNOSIS — G5631 Lesion of radial nerve, right upper limb: Secondary | ICD-10-CM

## 2017-07-18 DIAGNOSIS — E507 Other ocular manifestations of vitamin A deficiency: Secondary | ICD-10-CM

## 2017-07-18 MED ORDER — TRIAMCINOLONE ACETONIDE 0.1 % EX CREA: 1.0000 "application " | TOPICAL_CREAM | Freq: Two times a day (BID) | CUTANEOUS | 1 refills | Status: DC

## 2017-07-18 NOTE — Progress Notes (Signed)
Cynthia [name] is a 57 y.o. female who presents to [facility] today for rash and follow-up right arm pain.   Rash: Cynthia [name] notes an itchy somewhat mildly irritated rash on her back.  Her massage therapist thought the rash looked like shingles and she is here today to get it checked out.  She had a shingles episode a few years ago notes that the symptoms are not consistent with shingles previously.  She denies significant pain.  Right arm pain: Cynthia [name] was seen a month ago for pain in the right arm radiating to the right dorsal hand thought to be compression of the right radial nerve.  A nerve conduction study has been ordered and is scheduled for next week.  She was prescribed prednisone and Lyrica.  She notes the prednisone course was helpful but symptoms return following completion of the prednisone course.  Lyrica was not helpful and she no longer is taking it.   Past Medical History:  Diagnosis Date  . Acoustic neuroma ([place])   . ADD (attention deficit disorder)    and OCD-on Vyvanse  . Asthma    w/out status asthmaticus  . Awareness under anesthesia   . Balance problem    after acoustic neuroma excision  . Benign brain tumor ([place])   . Chronic back pain   . Constipation, chronic    IBS, CIC  . Diabetes mellitus   . Difficult airway   . 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 GERD (gastroesophageal reflux disease)   . Iron deficiency anemia, unspecified 02/18/2013  . Obesity    296 lbs (highest weight in 2012)   . Tendonitis, Achilles, left    Past Surgical History:  Procedure Laterality Date  . ABLATION     and bladder sling  . ACHILLES TENDON REPAIR     x2  . ANAL FISSURE REPAIR  2012  . BACK SURGERY    . CHOLECYSTECTOMY    . CRANIOTOMY  09/13/2015   with excision of acoustic neuroma  . IMPLANTATION BONE ANCHORED HEARING AID    . MOUTH  SURGERY  2015   3 implants  . OVARIAN CYST REMOVAL     x4  . THYROID LOBECTOMY Right 04/28/2015   Partial   . TOE FUSION Left    Social History   Tobacco Use  . Smoking status: Never Smoker  . Smokeless tobacco: Never Used  Substance Use Topics  . Alcohol use: Yes    Alcohol/week: 0.5 - 1.0 oz    Types: 1 - 2 Standard drinks or equivalent per week   family history includes Cancer in her brother and father; Dystonia in her mother; Heart Problems in her mother; Heart attack in her father; Heart disease in her father, maternal grandfather, other, paternal grandfather, and paternal grandmother; Hypercholesterolemia in her mother; Lung cancer in her paternal grandfather; Thyroid disease in her mother.  ROS as above:  Medications: Current Outpatient Medications  Medication Sig Dispense Refill  . albuterol (PROVENTIL HFA;VENTOLIN HFA) 108 (90 Base) MCG/ACT inhaler Inhale 2 puffs into the lungs every 6 (six) hours as needed for wheezing or shortness of breath. 1 Inhaler 2  . budesonide-formoterol (SYMBICORT) 160-4.5 MCG/ACT inhaler INHALE 2 PUFFS INTO THE LUNGS 2 TIMES DAILY 10.2 Inhaler 5  . buPROPion (WELLBUTRIN XL) 300 MG 24 hr tablet TAKE 1 TABLET DAILY 90 tablet 1  . celecoxib (CELEBREX) 200 MG capsule  TAKE 1 CAPSULE TWICE A DAY 180 capsule 1  . cyclobenzaprine (FLEXERIL) 10 MG tablet Take 1 tablet (10 mg total) by mouth at bedtime. 90 tablet 3  . dexlansoprazole (DEXILANT) 60 MG capsule Take 60 mg by mouth daily.    [name] Kitchen docusate sodium (COLACE) 50 MG capsule Take 100 mg by mouth at bedtime.    [name] Kitchen estradiol (ESTRACE VAGINAL) 0.1 MG/GM vaginal cream 1 gram pv twice weekly. 42.5 g 2  . levothyroxine (SYNTHROID, LEVOTHROID) 25 MCG tablet TAKE 2 TABLETS BEFORE BREAKFAST ON EMPTY STOMACH 60 tablet 2  . LINZESS 290 MCG CAPS capsule Take 1 capsule by mouth daily.    [name] Kitchen lisdexamfetamine (VYVANSE) 60 MG capsule Take 1 capsule (60 mg total) by mouth every morning. 90 capsule 0  .  triamterene-hydrochlorothiazide (MAXZIDE-25) 37.5-25 MG tablet Take 1 tablet by mouth daily. 90 tablet 1  . triamcinolone cream (KENALOG) 0.1 % Apply 1 application topically 2 (two) times daily. 453.6 g 1   No current facility-administered medications for this visit.    Allergies  Allergen Reactions  . Mobic [Meloxicam] Nausea And Vomiting  . Multivitamins Other (See Comments)    Health Maintenance Health Maintenance  Topic Date Due  . HIV Screening  01/10/1978  . MAMMOGRAM  02/21/2017  . PAP SMEAR  08/11/2018  . COLONOSCOPY  12/14/2020  . TETANUS/TDAP  07/20/2026  . INFLUENZA VACCINE  Completed  . Hepatitis C Screening  Completed     Exam:  BP 138/89   Pulse 92   Ht 5\' 8"  (1.727 m)   Wt 216 lb (98 kg)   BMI 32.84 kg/m  Gen: Well NAD HEENT: EOMI,  MMM Lungs: Normal work of breathing. CTABL Heart: RRR no MRG Abd: NABS, Soft. Nondistended, Nontender Exts: Brisk capillary refill, warm and well perfused.  Skin: Mildly excoriated mildly erythematous scaly skin on midline back consistent with dry skin MSK: Right arm normal-appearing normal motion mildly tender to palpation at the mid lateral humerus.   No results found for this or any previous visit (from the past 72 hour(s)). No results found.    Assessment and Plan: 57 y.o. female with  Dry skin: Treatment with triamcinolone cream and lotion.  Right arm symptoms likely radial nerve compression.  See prior notes for further discussion.  Plan to follow-up after nerve conduction study.  We will go over results and continue treatment options.  Based on partial response to oral prednisone we may want to consider ultrasound-guided injection.   No orders of the defined types were placed in this encounter.  Meds ordered this encounter  Medications  . triamcinolone cream (KENALOG) 0.1 %    Sig: Apply 1 application topically 2 (two) times daily.    Dispense:  453.6 g    Refill:  1     Discussed warning signs or  symptoms. Please see discharge instructions. Patient expresses understanding.

## 2017-07-18 NOTE — Patient Instructions (Addendum)
Thank you for coming in today. We will get results of nerve study soon.  If its been more than a week send [place] a [employer].   For dry itchy skin apply lotion and triamcinolone cream after showers or up to 2x daily.   We will check back soon.

## 2017-07-23 ENCOUNTER — Encounter: Payer: Self-pay | Admitting: Internal Medicine

## 2017-07-23 ENCOUNTER — Ambulatory Visit: Payer: BLUE CROSS/BLUE SHIELD | Admitting: Internal Medicine

## 2017-07-23 VITALS — BP 113/83 | HR 85 | Ht 68.0 in | Wt 214.6 lb

## 2017-07-23 DIAGNOSIS — E890 Postprocedural hypothyroidism: Secondary | ICD-10-CM

## 2017-07-23 DIAGNOSIS — E063 Autoimmune thyroiditis: Secondary | ICD-10-CM

## 2017-07-23 DIAGNOSIS — D497 Neoplasm of unspecified behavior of endocrine glands and other parts of nervous system: Secondary | ICD-10-CM | POA: Diagnosis not present

## 2017-07-23 NOTE — Patient Instructions (Signed)
Please continue Levothyroxine 50 mcg daily.  Take the thyroid hormone every day, with water, at least 30 minutes before breakfast, separated by at least 4 hours from: - acid reflux medications - calcium - iron - multivitamins  Please return in 1 year.

## 2017-07-23 NOTE — Progress Notes (Signed)
Patient ID: Cynthia [name], female   DOB: [birth year], 57 y.o.   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 Last visit 1 year ago.  Reviewed her thyroid neoplasm history: She noticed a lump in her throat in 2015 and had hoarseness. Saw PCP >> thyroid U/S >> thyroid nodule >> FNA: Suspicious for follicular neoplasm >> referred to ENT >> R hemithyroidectomy (Dr Simonne Maffucci). 02/22/2015:  Thyroid ultrasound:  Right thyroid lobe : 6.4 x 2.5 x 2.3 cm. Heterogeneous appearance. Solid nodule measuring 2.4 x 1.8 x 1.9 centimeters, without internal calcification , with hypoechoic margin and no significant internal blood flow.  Left thyroid lobe, 5.6 x 1.7 x 1.5 cm. Heterogeneous appearance. No focal nodules.  Isthmus : 4 mm in thickness, no nodules visualized.  No lymphadenopathy visualized. 03/29/2015: Right thyroid nodule FNA: Suspicious for a follicular neoplasm ([employer]hes[employer] category IV) 04/28/2015: R Hemithyroidectomy by Dr Simonne Maffucci ([place]) Path: A. 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 Resection margins are negative. Note: Dr. Vernell Barrier has also reviewed this case and concurs. JAMA Oncol. 2016; 2(8):1023-29. A. "Right thyroid node, stitch at superior pole", in formalin. Received is a 14g, 4.5 x 3.0 x 2.5 cm lobe of thyroid. Inked as follows: Anterior - blue, posterior - black, ischemic margin - yellow. Sectioning reveals a 2.4 x 1.5 x 2.4 cm well-circumscribed nodule with a homogenous firm tan surface. The nodule is in the inferior portion of the lobe and markedly distorts the anterior and posterior capsule. In the superior portion of the lobe there are multiple white areas, which range from 0.2 to 0.7 cm in greatest dimension. Additionally received is detached brown fragment, 1.2 x 1.0 x 0.3 cm.  Block summary: A1-A2 - Full  thickness section of nodule, bisected A3-A4 - Full thickness section of nodule, bisected and including ischemic margin A5-A7 - Representative section of superior portion of thyroid A8 - Detached fragment completely submitted A9-A15 - Remainder of tumor capsule, entirely from superior to inferior  08/06/2016: Neck U/S:  1. No residual/recurrent tissue post right thyroid lobectomy. 2. Solitary small left 0.4 cm nodule does not meet current criteria for biopsy or dedicated imaging follow-up.  She has a combination of postsurgical + Hashimoto's hypothyroidism.   Pt is on levothyroxine 50 mcg daily, taken: - in am - fasting - at least 2 hours from b'fast - no Ca, Fe, MVI - + Dexilant at 7-8 pm  - not on Biotin  I reviewed pt's thyroid tests - normal at last check: Lab Results  Component Value Date   TSH 3.15 08/29/2016   TSH 1.72 10/07/2015   TSH 4.44 07/21/2015   TSH 4.05 06/08/2015   TSH 2.669 02/22/2015   TSH 2.489 10/15/2013   FREET4 1.16 08/29/2016   FREET4 1.10 10/07/2015   FREET4 1.11 07/21/2015   FREET4 0.87 06/08/2015   FREET4 1.13 02/22/2015  07/17/2016: TSH 5.160 (0.45-4.5)    Component     Latest Ref Rng & Units 06/08/2015  Thyroperoxidase Ab SerPl-aCnc     <9 IU/mL 80 (H)  Thyroglobulin Ab     <2 IU/mL 1   Pt denies: - feeling nodules in neck - hoarseness - dysphagia - choking - SOB with lying down  She has + FH of thyroid disorders in: mother. No FH of thyroid cancer. No h/o radiation tx to head or neck. No seaweed or kelp. No  recent contrast studies. No herbal supplements. No Biotin use. No recent steroids use.   No DM or preDM but has a h/o Hgly. Lab Results  Component Value Date   HGBA1C 5.2 04/16/2016   HGBA1C 5.4 06/08/2015   HGBA1C 5.1 10/15/2013   She was on Metformin >> lost 80-90 lbs (heaviest: ~290 lbs) >> sugars improved >> taken off the med >> started to gain weight again.   She has a h/o acoustic neuroma  Sx.  ROS: Constitutional: no weight gain/no weight loss, no fatigue, no subjective hyperthermia, no subjective hypothermia Eyes: no blurry vision, no xerophthalmia ENT: no sore throat,+ see HPI Cardiovascular: no CP/no SOB/no palpitations/no leg swelling Respiratory: no cough/no SOB/no wheezing Gastrointestinal: no N/no V/no D/no C/no acid reflux Musculoskeletal: no muscle aches/no joint aches Skin: no rashes, no hair loss Neurological: + tremors/no numbness/no tingling/no dizziness  I reviewed pt's medications, allergies, PMH, social hx, family hx, and changes were documented in the history of present illness. Otherwise, unchanged from my initial visit note.  Past Medical History:  Diagnosis Date  . Acoustic neuroma ([place])   . ADD (attention deficit disorder)    and OCD-on Vyvanse  . Asthma    w/out status asthmaticus  . Awareness under anesthesia   . Balance problem    after acoustic neuroma excision  . Benign brain tumor ([place])   . Chronic back pain   . Constipation, chronic    IBS, CIC  . Diabetes mellitus   . Difficult airway   . 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 GERD (gastroesophageal reflux disease)   . Iron deficiency anemia, unspecified 02/18/2013  . Obesity    296 lbs (highest weight in 2012)   . Tendonitis, Achilles, left    Past Surgical History:  Procedure Laterality Date  . ABLATION     and bladder sling  . ACHILLES TENDON REPAIR     x2  . ANAL FISSURE REPAIR  2012  . BACK SURGERY    . CHOLECYSTECTOMY    . CRANIOTOMY  09/13/2015   with excision of acoustic neuroma  . IMPLANTATION BONE ANCHORED HEARING AID    . MOUTH SURGERY  2015   3 implants  . OVARIAN CYST REMOVAL     x4  . THYROID LOBECTOMY Right 04/28/2015   Partial   . TOE FUSION Left    Social History   Social History Main Topics  . Smoking status: Never Smoker   . Smokeless tobacco: Never Used  . Alcohol Use: 0.5 - 1.0 oz/week    1-2 Standard drinks or equivalent  per week  . Drug Use: No  . Sexual Activity:    Partners: Male    Birth Control/ Protection: Other-see comments     Comment: vasectomy   Social History Narrative   Marital Status:  Married Information systems manager)    Children:  None    Pets: Dog (01)    Living Situation: Lives with husband     Occupation:  IT - [employer]    Education:  Master's Degree    Tobacco Use/Exposure:  None    Alcohol Use:  Occasional   Drug Use:  None   Diet:  Regular   Exercise:  Kick-boxing and boot camp - 5 times per week    Hobbies: Golf, tennis and working out   Current Outpatient Medications on File Prior to Visit  Medication Sig Dispense Refill  . albuterol (PROVENTIL HFA;VENTOLIN HFA) 108 (90 Base)  MCG/ACT inhaler Inhale 2 puffs into the lungs every 6 (six) hours as needed for wheezing or shortness of breath. 1 Inhaler 2  . budesonide-formoterol (SYMBICORT) 160-4.5 MCG/ACT inhaler INHALE 2 PUFFS INTO THE LUNGS 2 TIMES DAILY 10.2 Inhaler 5  . buPROPion (WELLBUTRIN XL) 300 MG 24 hr tablet TAKE 1 TABLET DAILY 90 tablet 1  . celecoxib (CELEBREX) 200 MG capsule TAKE 1 CAPSULE TWICE A DAY 180 capsule 1  . cyclobenzaprine (FLEXERIL) 10 MG tablet Take 1 tablet (10 mg total) by mouth at bedtime. 90 tablet 3  . dexlansoprazole (DEXILANT) 60 MG capsule Take 60 mg by mouth daily.    [name] Kitchen docusate sodium (COLACE) 50 MG capsule Take 100 mg by mouth at bedtime.    [name] Kitchen estradiol (ESTRACE VAGINAL) 0.1 MG/GM vaginal cream 1 gram pv twice weekly. 42.5 g 2  . levothyroxine (SYNTHROID, LEVOTHROID) 25 MCG tablet TAKE 2 TABLETS BEFORE BREAKFAST ON EMPTY STOMACH 60 tablet 2  . LINZESS 290 MCG CAPS capsule Take 1 capsule by mouth daily.    [name] Kitchen lisdexamfetamine (VYVANSE) 60 MG capsule Take 1 capsule (60 mg total) by mouth every morning. 90 capsule 0  . triamcinolone cream (KENALOG) 0.1 % Apply 1 application topically 2 (two) times daily. 453.6 g 1  . triamterene-hydrochlorothiazide (MAXZIDE-25) 37.5-25 MG tablet Take 1 tablet by  mouth daily. 90 tablet 1   No current facility-administered medications on file prior to visit.    Allergies  Allergen Reactions  . Mobic [Meloxicam] Nausea And Vomiting  . Multivitamins Other (See Comments)   Family History  Problem Relation Age of Onset  . Thyroid disease Mother   . Heart Problems Mother        vavle issue  . Dystonia Mother   . Hypercholesterolemia Mother   . Heart attack Father   . Cancer Father        esophageal  . Heart disease Father   . Lung cancer Paternal Grandfather   . Heart disease Paternal Grandfather   . Heart disease Maternal Grandfather   . Heart disease Paternal Grandmother   . Heart disease Other        paternal and maternal side  . Cancer Brother        esophageal/stomach cancer   PE: BP 113/83 (BP Location: Left Arm, Patient Position: Sitting, Cuff Size: Large)   Pulse 85   Ht 5\' 8"  (1.727 m)   Wt 214 lb 9.6 oz (97.3 kg)   BMI 32.63 kg/m  Wt Readings from Last 3 Encounters:  07/23/17 214 lb 9.6 oz (97.3 kg)  07/18/17 216 lb (98 kg)  06/26/17 211 lb (95.7 kg)   Constitutional: overweight, in NAD Eyes: PERRLA, EOMI, no exophthalmos ENT: moist mucous membranes, no thyroid masses, no cervical lymphadenopathy Cardiovascular: RRR, No MRG Respiratory: CTA B Gastrointestinal: abdomen soft, NT, ND, BS+ Musculoskeletal: no deformities, strength intact in all 4 Skin: moist, warm, no rashes Neurological: + tremor with outstretched hands, R >> L, DTR normal in all 4  ASSESSMENT: 1.  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 Postsurgical + Hashimoto hypothyroidism  PLAN:  1.  Follicular thyroid neoplasm - s/p R hemithyroidectomy - pt's tumor is the lowest risk across the thyroid cancer classification.  Based on recent guidelines, treatment for these tumors can be de-escalated from completion thyroidectomy and RAI treatment to just  monitoring after hemithyroidectomy. The chance of recurrence is <1% for the next 15 years -  we will continue to follow her by neck U/S's and TFTs - reviewed [facility] U/[facility] from [facility] >> no neck masses. Small 0.4 cm nodule not worrisome - plan to repeat U/S in 5 years from prev. or if she feels any neck compression sxs  2.  Postsurgical + Hashimoto's hypothyroidism - mild - TSH was 5.16 (slightly high) >> we separated Dexilant from LT4 >> TFTs normalized - latest thyroid labs reviewed with pt >> normal 08/2016 - she continues on LT4 50 mcg daily - pt feels good on this dose, but has more fatigue - we discussed about taking the thyroid hormone every day, with water, >30 minutes before breakfast, separated by >4 hours from acid reflux medications, calcium, iron, multivitamins. Pt. is taking it correctly. - will check thyroid tests today: TSH and fT4 - If labs are abnormal, she will need to return for repeat TFTs in 1.5 months  Needs refills.  Office Visit on 07/23/2017  Component Date Value Ref Range Status  . TSH 07/23/2017 2.54  0.35 - 4.50 uIU/mL Final  . Free T4 07/23/2017 1.03  0.60 - 1.60 ng/dL Final   Comment: Specimens from patients who are undergoing biotin therapy and /or ingesting biotin supplements may contain high levels of biotin.  The higher biotin concentration in these specimens interferes with this Free T4 assay.  Specimens that contain high levels  of biotin may cause false high results for this Free T4 assay.  Please interpret results in light of the total clinical presentation of the patient.     Normal TFTs.   Philemon Kingdom, MD PhD [facility]

## 2017-07-24 ENCOUNTER — Ambulatory Visit (INDEPENDENT_AMBULATORY_CARE_PROVIDER_SITE_OTHER): Payer: BLUE CROSS/BLUE SHIELD | Admitting: Neurology

## 2017-07-24 ENCOUNTER — Encounter: Payer: Self-pay | Admitting: Neurology

## 2017-07-24 DIAGNOSIS — G5631 Lesion of radial nerve, right upper limb: Secondary | ICD-10-CM

## 2017-07-24 LAB — TSH: TSH: 2.54 u[IU]/mL (ref 0.35–4.50)

## 2017-07-24 LAB — T4, FREE: Free T4: 1.03 ng/dL (ref 0.60–1.60)

## 2017-07-24 MED ORDER — LEVOTHYROXINE SODIUM 50 MCG PO TABS: ORAL_TABLET | ORAL | 3 refills | Status: DC

## 2017-07-24 NOTE — Progress Notes (Signed)
Please refer to EMG and nerve conduction study procedure note. 

## 2017-07-24 NOTE — Procedures (Signed)
     HISTORY:  Cynthia [name] is a 57 year old patient with a history of right arm and forearm discomfort that began after a flu shot in October 2018.  The patient believes that the pain began almost immediately after the shot and has persisted.  She may have some numbness in the hand and wrist at nighttime.  The patient is being evaluated for a possible neuropathy.  NERVE CONDUCTION STUDIES:  Nerve conduction studies were performed on the right upper extremity. The distal motor latencies and motor amplitudes for the median and ulnar nerves were within normal limits.  The distal motor latency for the right radial nerve was slightly prolonged, but with a normal motor amplitude.  The nerve conduction velocities for this nerve were normal.  The F wave latencies and nerve conduction velocities for the median nerve was also normal. The sensory latencies for the median, radial and ulnar nerves were normal.   EMG STUDIES:  EMG study was performed on the right upper extremity:  The first dorsal interosseous muscle reveals 2 to 4 K units with full recruitment. No fibrillations or positive waves were noted. The abductor pollicis brevis muscle reveals 2 to 4 K units with full recruitment. No fibrillations or positive waves were noted. The extensor indicis proprius muscle reveals 1 to 3 K units with full recruitment. No fibrillations or positive waves were noted. The pronator teres muscle reveals 2 to 3 K units with full recruitment. No fibrillations or positive waves were noted. The biceps muscle reveals 1 to 2 K units with full recruitment. No fibrillations or positive waves were noted. The triceps muscle reveals 2 to 4 K units with full recruitment. No fibrillations or positive waves were noted. The anterior deltoid muscle reveals 2 to 3 K units with full recruitment. No fibrillations or positive waves were noted. The cervical paraspinal muscles were tested at 2 levels. No abnormalities of insertional  activity were seen at either level tested. There was good relaxation.   IMPRESSION:  Nerve conduction studies done on the right upper extremity were relatively unremarkable without clear evidence of a neuropathy.  EMG evaluation of the right upper extremity was unremarkable, without evidence of an overlying cervical radiculopathy.  [name] MD 07/24/2017 4:35 PM  [facility] [address] [place], [place] [address]  Phone [phone] Fax [phone]

## 2017-07-25 NOTE — Progress Notes (Signed)
[place]    Nerve / Sites Muscle Latency Ref. Amplitude Ref. Rel Amp Segments Distance Velocity Ref. Area    ms ms mV mV %  cm m/s m/s mVms  R Median - APB     Wrist APB 3.0 ?4.4 15.9 ?4.0 100 Wrist - APB 7   48.5     Upper arm APB 7.3  15.9  100 Upper arm - Wrist 25 57 ?49 46.5  R Ulnar - ADM     Wrist ADM 2.9 ?3.3 7.9 ?6.0 100 Wrist - ADM 7   29.9     B.Elbow ADM 6.6  8.2  103 B.Elbow - Wrist 22 59 ?49 31.1     A.Elbow ADM 8.2  7.3  88.8 A.Elbow - B.Elbow 10 64 ?49 26.7         A.Elbow - Wrist      R Radial - EIP     Forearm EIP 3.8 ?2.9 4.5 ?2.0 100 Forearm - EIP 4  ?49 31.7     Elbow EIP 6.8  4.3  95.4 Elbow - Forearm 28 93  24.2     Spiral Gr EIP 10.9  1.8  40.7 Spiral Gr - Elbow 38 94  14.5           SNC    Nerve / Sites Rec. Site Peak Lat Amp Segments Distance    ms V  cm  R Median - Orthodromic (Dig II, Mid palm)     Dig II Wrist 3.0 44 Dig II - Wrist 13  R Ulnar - Orthodromic, (Dig V, Mid palm)     Dig V Wrist 2.9 29 Dig V - Wrist 11         SNC    Nerve / Sites Rec. Site Peak Lat Ref.  Amp Ref. Segments Distance    ms ms V V  cm  R Radial - Anatomical snuff box (Forearm)     Forearm Wrist 2.4 ?2.9 32 ?15 Forearm - Wrist 10       F  Wave    Nerve F Lat Ref.   ms ms  R Median - APB 27.8 ?31.0       EMG

## 2017-07-31 ENCOUNTER — Other Ambulatory Visit: Payer: Self-pay | Admitting: Internal Medicine

## 2017-08-19 ENCOUNTER — Other Ambulatory Visit: Payer: Self-pay | Admitting: *Deleted

## 2017-08-19 ENCOUNTER — Encounter: Payer: Self-pay | Admitting: Physician Assistant

## 2017-08-19 DIAGNOSIS — F900 Attention-deficit hyperactivity disorder, predominantly inattentive type: Secondary | ICD-10-CM

## 2017-08-19 MED ORDER — LISDEXAMFETAMINE DIMESYLATE 60 MG PO CAPS: 60.0000 mg | ORAL_CAPSULE | ORAL | 0 refills | Status: DC

## 2017-08-19 NOTE — Telephone Encounter (Signed)
Pt called needing a refill of her Vyvanse.  She stated the pharmacy only filled #30 of the #90 you sent in last month.

## 2017-08-20 MED ORDER — LISDEXAMFETAMINE DIMESYLATE 60 MG PO CAPS: 60.0000 mg | ORAL_CAPSULE | ORAL | 0 refills | Status: DC

## 2017-08-20 NOTE — Telephone Encounter (Signed)
Sent!

## 2017-09-01 ENCOUNTER — Other Ambulatory Visit: Payer: Self-pay | Admitting: Physician Assistant

## 2017-09-01 DIAGNOSIS — I10 Essential (primary) hypertension: Secondary | ICD-10-CM

## 2017-09-02 DIAGNOSIS — H90A22 Sensorineural hearing loss, unilateral, left ear, with restricted hearing on the contralateral side: Secondary | ICD-10-CM | POA: Diagnosis not present

## 2017-09-02 DIAGNOSIS — D333 Benign neoplasm of cranial nerves: Secondary | ICD-10-CM | POA: Diagnosis not present

## 2017-09-11 DIAGNOSIS — H8111 Benign paroxysmal vertigo, right ear: Secondary | ICD-10-CM | POA: Diagnosis not present

## 2017-09-11 DIAGNOSIS — J302 Other seasonal allergic rhinitis: Secondary | ICD-10-CM | POA: Diagnosis not present

## 2017-09-18 ENCOUNTER — Ambulatory Visit: Payer: BLUE CROSS/BLUE SHIELD | Admitting: Physician Assistant

## 2017-09-26 ENCOUNTER — Encounter: Payer: Self-pay | Admitting: Physician Assistant

## 2017-09-26 ENCOUNTER — Ambulatory Visit: Payer: BLUE CROSS/BLUE SHIELD | Admitting: Physician Assistant

## 2017-09-26 VITALS — BP 132/81 | HR 77 | Ht 68.0 in | Wt 215.0 lb

## 2017-09-26 DIAGNOSIS — R5383 Other fatigue: Secondary | ICD-10-CM

## 2017-09-26 DIAGNOSIS — Z131 Encounter for screening for diabetes mellitus: Secondary | ICD-10-CM | POA: Diagnosis not present

## 2017-09-26 DIAGNOSIS — Z13220 Encounter for screening for lipoid disorders: Secondary | ICD-10-CM

## 2017-09-26 DIAGNOSIS — F900 Attention-deficit hyperactivity disorder, predominantly inattentive type: Secondary | ICD-10-CM | POA: Diagnosis not present

## 2017-09-26 MED ORDER — LISDEXAMFETAMINE DIMESYLATE 60 MG PO CAPS: 60.0000 mg | ORAL_CAPSULE | ORAL | 0 refills | Status: DC

## 2017-09-26 NOTE — Progress Notes (Signed)
Subjective:    Patient ID: Cynthia [name], female    DOB: Aug 18, 1960, 57 y.o.   MRN: [id number]  HPI Cynthia [name] presents today regarding Vyvanse refill. She states that she is doing well on her medication at the dose that she is at. She denies any insomnia, weight loss, nausea, vomiting, palpitations. She does however mention that over the past 2 months she has been generally feeling more fatigued that normal. She had her thyroid checked in February which was normal. She denies any specific inciting factors but would like to have some labs checked. Pt has hx of sleep apnea but stopped using mask when she lost weight. She does admit to not feeling as rested in am.   .. Active Ambulatory Problems    Diagnosis Date Noted  . Iron deficiency anemia, unspecified 02/18/2013  . IFG (impaired fasting glucose) 09/13/2013  . Wheezing 09/13/2013  . Swelling of limb 09/13/2013  . Obesity, unspecified 09/13/2013  . Interdigital neuralgia 04/14/2013  . 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 ADD (attention deficit disorder) 05/11/2014  . Low back pain 05/11/2014  . Chronic constipation 05/11/2014  . Essential (primary) hypertension 05/11/2014  . Gastro-esophageal reflux disease without esophagitis 05/11/2014  . Asthma, mild persistent 05/11/2014  . 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 Hypokalemia 02/13/2015  . 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 Hashimoto's thyroiditis 06/14/2015  . Subconjunctival hemorrhage of right eye 06/28/2015  . Acoustic neuroma ([place]) 12/10/2014  . Left ear hearing loss 10/14/2015  . 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 Costochondritis 03/21/2016  . Left lumbar radiculopathy 09/19/2016  . Irritable 10/17/2016  . Fatty liver disease, nonalcoholic 40/98/1191  . Left renal mass 03/26/2017  . Obesity (BMI 30.0-34.9) 03/26/2017  . Compression of right radial nerve 06/26/2017   Resolved Ambulatory Problems    Diagnosis Date Noted  .  Backache 09/13/2013  . Thyromegaly 02/13/2015   Past Medical History:  Diagnosis Date  . Acoustic neuroma ([place])   . ADD (attention deficit disorder)   . Asthma   . Awareness under anesthesia   . Balance problem   . Benign brain tumor ([place])   . Chronic back pain   . Constipation, chronic   . Diabetes mellitus   . Difficult airway   . 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  . GERD (gastroesophageal reflux disease)   . Iron deficiency anemia, unspecified 02/18/2013  . Obesity   . Tendonitis, Achilles, left       Review of Systems  Constitutional: Positive for fatigue. Negative for activity change, appetite change and unexpected weight change.  Respiratory: Negative for shortness of breath.   Cardiovascular: Negative for chest pain and palpitations.  Gastrointestinal: Negative for nausea and vomiting.  Neurological: Negative for dizziness, weakness and headaches.  Psychiatric/Behavioral: Negative for sleep disturbance. The patient is not nervous/anxious.        Objective:   Physical Exam  Constitutional: She is oriented to person, place, and time. She appears well-developed and well-nourished. No distress.  HENT:  Head: Normocephalic.  Eyes: Conjunctivae are normal.  Neck: Normal range of motion. Neck supple.  Cardiovascular: Normal rate, regular rhythm, normal heart sounds and intact distal pulses.  No murmur heard. Pulmonary/Chest: Breath sounds normal. No respiratory distress. She has no wheezes.  Musculoskeletal: Normal range of motion.  Neurological: She is alert and oriented to person, place, and time.  Skin: Skin is warm and dry.  Psychiatric: She has a normal mood and affect. Her behavior is normal.  Assessment & Plan:  [name] KitchenMarland KitchenDiagnoses and all orders for this visit:  No energy -     CBC -     Vitamin D 1,25 dihydroxy -     B12 and Folate Panel -     Fe+TIBC+Fer  Screening for lipid disorders -     Lipid Panel w/reflex Direct LDL  Screening for diabetes mellitus -      COMPLETE METABOLIC PANEL WITH GFR  Attention deficit hyperactivity disorder (ADHD), predominantly inattentive type -     Discontinue: lisdexamfetamine (VYVANSE) 60 MG capsule; Take 1 capsule (60 mg total) by mouth every morning. -     Discontinue: lisdexamfetamine (VYVANSE) 60 MG capsule; Take 1 capsule (60 mg total) by mouth every morning. -     lisdexamfetamine (VYVANSE) 60 MG capsule; Take 1 capsule (60 mg total) by mouth every morning.   Pt needs some screening labs that have been ordered.   3 months of Vyvanse sent to pharmacy.   To evaluate for energy will get labs. Pt denies and depression. She does have hx of sleep apnea. We do need to consider sleep study in future if fatigue continues.

## 2017-09-27 DIAGNOSIS — Z13220 Encounter for screening for lipoid disorders: Secondary | ICD-10-CM | POA: Diagnosis not present

## 2017-09-27 DIAGNOSIS — Z131 Encounter for screening for diabetes mellitus: Secondary | ICD-10-CM | POA: Diagnosis not present

## 2017-09-27 DIAGNOSIS — R5383 Other fatigue: Secondary | ICD-10-CM | POA: Diagnosis not present

## 2017-09-29 NOTE — Progress Notes (Signed)
Call pt: cholesterol looks fantastic. Potassium down a little bit likely due to diuretic. Recheck in 2 weeks if the same or trending down will need to add potassium. Increase potassium rich foods.

## 2017-09-30 LAB — COMPLETE METABOLIC PANEL WITH GFR
AG Ratio: 2.1 (calc) (ref 1.0–2.5)
ALT: 19 U/L (ref 6–29)
AST: 19 U/L (ref 10–35)
Albumin: 4.4 g/dL (ref 3.6–5.1)
Alkaline phosphatase (APISO): 60 U/L (ref 33–130)
BUN: 23 mg/dL (ref 7–25)
CO2: 30 mmol/L (ref 20–32)
Calcium: 8.8 mg/dL (ref 8.6–10.4)
Chloride: 103 mmol/L (ref 98–110)
Creat: 0.99 mg/dL (ref 0.50–1.05)
GFR, Est African American: 75 mL/min/{1.73_m2} (ref >=60)
GFR, Est Non African American: 65 mL/min/{1.73_m2} (ref >=60)
Globulin: 2.1 g/dL (calc) (ref 1.9–3.7)
Glucose, Bld: 98 mg/dL (ref 65–99)
Potassium: 3.2 mmol/L — ABNORMAL LOW (ref 3.5–5.3)
Sodium: 139 mmol/L (ref 135–146)
Total Bilirubin: 1.2 mg/dL (ref 0.2–1.2)
Total Protein: 6.5 g/dL (ref 6.1–8.1)

## 2017-09-30 LAB — B12 AND FOLATE PANEL
Folate: 13.5 ng/mL
Vitamin B-12: 543 pg/mL (ref 200–1100)

## 2017-09-30 LAB — LIPID PANEL W/REFLEX DIRECT LDL
Cholesterol: 178 mg/dL (ref <200)
HDL: 74 mg/dL (ref >50)
LDL Cholesterol (Calc): 89 mg/dL (calc)
Non-HDL Cholesterol (Calc): 104 mg/dL (calc) (ref <130)
Total CHOL/HDL Ratio: 2.4 (calc) (ref <5.0)
Triglycerides: 63 mg/dL (ref <150)

## 2017-09-30 LAB — CBC
HCT: 37.9 % (ref 35.0–45.0)
Hemoglobin: 13.3 g/dL (ref 11.7–15.5)
MCH: 29 pg (ref 27.0–33.0)
MCHC: 35.1 g/dL (ref 32.0–36.0)
MCV: 82.6 fL (ref 80.0–100.0)
MPV: 9.1 fL (ref 7.5–12.5)
Platelets: 166 10*3/uL (ref 140–400)
RBC: 4.59 10*6/uL (ref 3.80–5.10)
RDW: 12.7 % (ref 11.0–15.0)
WBC: 5.2 10*3/uL (ref 3.8–10.8)

## 2017-09-30 LAB — IRON,TIBC AND FERRITIN PANEL
%SAT: 30 % (calc) (ref 11–50)
Ferritin: 21 ng/mL (ref 10–232)
Iron: 88 ug/dL (ref 45–160)
TIBC: 296 mcg/dL (calc) (ref 250–450)

## 2017-09-30 LAB — VITAMIN D 1,25 DIHYDROXY
Vitamin D 1, 25 (OH)2 Total: 39 pg/mL (ref 18–72)
Vitamin D2 1, 25 (OH)2: <8 pg/mL
Vitamin D3 1, 25 (OH)2: 39 pg/mL

## 2017-10-17 ENCOUNTER — Encounter: Payer: Self-pay | Admitting: Obstetrics & Gynecology

## 2017-10-17 ENCOUNTER — Other Ambulatory Visit: Payer: Self-pay | Admitting: Obstetrics & Gynecology

## 2017-10-17 MED ORDER — VALACYCLOVIR HCL 1 G PO TABS: ORAL_TABLET | ORAL | 1 refills | Status: DC

## 2017-10-18 ENCOUNTER — Telehealth: Payer: Self-pay | Admitting: Obstetrics & Gynecology

## 2017-10-18 NOTE — Telephone Encounter (Signed)
Patient sent the following correspondence through [facility]. Routing to triage to provider for review.  ----- Message from [place], Generic sent at 10/18/2017 10:09 AM EDT -----    Hi    Thank you for the quick response!  I picked it up this morning so that was perfect.    Have a good weekend!  Cynthia [name]    ----- Message -----  From: Megan Salon, MD  Sent: 10/17/17, 22:53  To: Cynthia [name]  Subject: RE: Non-Urgent Medical Question    I'm just seeing your message and I did send a refill to [facility] [place] that [facility] on [facility] record as a local pharmacy of choice. I think this pharmacy closed at [place]. I tried to see if there is a 24 hour pharmacy in [place] but I didn't see one. If you know of one and want me to sent a refill there, just let me know. I'll be doing some charting from today for a little while. Also, I did increased the dosage to 1000mg  instead of 500mg . Take it twice daily for three days and then daily if needed. Just let me know if I can help in any other way. I will be in the office tomorrow. We have a staff meeting so won't open until 9am but will be open all day.    Edwinna Areola    ----- Message -----   From: Cynthia [name]   Sent: 10/17/2017 9:14 PM EDT    To: Megan Salon, MD  Subject: Non-Urgent Medical Question    Hi Dr Sabra Heck,    I hope you are doing well!    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  My Valtrex is from 06/06/2016 and is not working at all this time.    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  I am nervous it will get worst as there is now another bump.   Would it be possible for you to send in a prescription of the Valacyclovir?  It always worked fine in the past!  I did take a picture if that would help.   Thanks,  [employer]   [phone]

## 2017-11-17 ENCOUNTER — Other Ambulatory Visit: Payer: Self-pay | Admitting: Family Medicine

## 2017-11-17 DIAGNOSIS — R454 Irritability and anger: Secondary | ICD-10-CM

## 2017-11-18 NOTE — Telephone Encounter (Signed)
Routed to pcp for review and signature..Barkley, Tonya Lynetta, CMA  

## 2017-12-24 ENCOUNTER — Ambulatory Visit: Payer: BLUE CROSS/BLUE SHIELD | Admitting: Physician Assistant

## 2017-12-24 ENCOUNTER — Encounter: Payer: Self-pay | Admitting: Physician Assistant

## 2017-12-24 VITALS — BP 122/67 | HR 73 | Ht 67.99 in | Wt 205.0 lb

## 2017-12-24 DIAGNOSIS — R0789 Other chest pain: Secondary | ICD-10-CM

## 2017-12-24 DIAGNOSIS — T753XXD Motion sickness, subsequent encounter: Secondary | ICD-10-CM

## 2017-12-24 DIAGNOSIS — F900 Attention-deficit hyperactivity disorder, predominantly inattentive type: Secondary | ICD-10-CM | POA: Diagnosis not present

## 2017-12-24 MED ORDER — LISDEXAMFETAMINE DIMESYLATE 60 MG PO CAPS: 60.0000 mg | ORAL_CAPSULE | ORAL | 0 refills | Status: DC

## 2017-12-24 MED ORDER — CELECOXIB 200 MG PO CAPS
200.0000 mg | ORAL_CAPSULE | Freq: Two times a day (BID) | ORAL | 3 refills | Status: DC
Start: 2017-12-24 — End: 2017-12-24

## 2017-12-24 MED ORDER — SCOPOLAMINE 1 MG/3DAYS TD PT72: 1.0000 | MEDICATED_PATCH | TRANSDERMAL | 0 refills | Status: DC

## 2017-12-24 MED ORDER — CELECOXIB 200 MG PO CAPS: 200.0000 mg | ORAL_CAPSULE | Freq: Two times a day (BID) | ORAL | 3 refills | Status: DC

## 2017-12-24 MED ORDER — LISDEXAMFETAMINE DIMESYLATE 60 MG PO CAPS
60.0000 mg | ORAL_CAPSULE | ORAL | 0 refills | Status: DC
Start: 2018-02-24 — End: 2018-03-03

## 2017-12-26 DIAGNOSIS — T753XXA Motion sickness, initial encounter: Secondary | ICD-10-CM | POA: Insufficient documentation

## 2017-12-26 NOTE — Progress Notes (Signed)
Subjective:    Patient ID: Cynthia [name], female    DOB: Nov 12, 1960, 57 y.o.   MRN: [id number]  HPI Pt is a 57 yo female with ADD who presents to the clinic for 3 month follow up. with ADD who presents to the clinic for 3 month follow up.   ADD- she is doing good. She has no problems or complaints. She is sleeping well and no increase anxiety.   She is going on a trip to [place]. She request something for motion sickness since she will be on a bus a lot in [place]. She has a hx of getting dizzy easily.   Request refills of celexbrex.   .. Active Ambulatory Problems    Diagnosis Date Noted  . Iron deficiency anemia, unspecified 02/18/2013  . IFG (impaired fasting glucose) 09/13/2013  . Wheezing 09/13/2013  . Swelling of limb 09/13/2013  . Obesity, unspecified 09/13/2013  . Interdigital neuralgia 04/14/2013  . 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 ADD (attention deficit disorder) 05/11/2014  . Low back pain 05/11/2014  . Chronic constipation 05/11/2014  . Essential (primary) hypertension 05/11/2014  . Gastro-esophageal reflux disease without esophagitis 05/11/2014  . Asthma, mild persistent 05/11/2014  . 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 Hypokalemia 02/13/2015  . 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 Hashimoto's thyroiditis 06/14/2015  . Subconjunctival hemorrhage of right eye 06/28/2015  . Acoustic neuroma ([place]) 12/10/2014  . Left ear hearing loss 10/14/2015  . 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 Costochondritis 03/21/2016  . Left lumbar radiculopathy 09/19/2016  . Irritable 10/17/2016  . Fatty liver disease, nonalcoholic 01/65/5374  . Left renal mass 03/26/2017  . Obesity (BMI 30.0-34.9) 03/26/2017  . Compression of right radial nerve 06/26/2017   Resolved Ambulatory Problems    Diagnosis Date Noted  . Backache 09/13/2013  . Thyromegaly 02/13/2015   Past Medical History:  Diagnosis Date  . Acoustic neuroma ([place])   . ADD (attention deficit disorder)   . Asthma   . Awareness  under anesthesia   . Balance problem   . Benign brain tumor ([place])   . Chronic back pain   . Constipation, chronic   . Diabetes mellitus   . Difficult airway   . 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  . GERD (gastroesophageal reflux disease)   . Iron deficiency anemia, unspecified 02/18/2013  . Obesity   . Tendonitis, Achilles, left       Review of Systems  All other systems reviewed and are negative.      Objective:   Physical Exam  Constitutional: She is oriented to person, place, and time. She appears well-developed and well-nourished.  HENT:  Head: Normocephalic and atraumatic.  Cardiovascular: Normal rate and regular rhythm.  Pulmonary/Chest: Effort normal and breath sounds normal.  Neurological: She is alert and oriented to person, place, and time.  Psychiatric: She has a normal mood and affect. Her behavior is normal.          Assessment & Plan:  [name] KitchenMarland KitchenDiagnoses and all orders for this visit:  Atypical chest pain -     Discontinue: celecoxib (CELEBREX) 200 MG capsule; Take 1 capsule (200 mg total) by mouth 2 (two) times daily. -     celecoxib (CELEBREX) 200 MG capsule; Take 1 capsule (200 mg total) by mouth 2 (two) times daily.  Attention deficit hyperactivity disorder (ADHD), predominantly inattentive type -     lisdexamfetamine (VYVANSE) 60 MG capsule; Take 1 capsule (60 mg total) by mouth every morning. -     lisdexamfetamine (VYVANSE) 60 MG capsule; Take  1 capsule (60 mg total) by mouth every morning. -     lisdexamfetamine (VYVANSE) 60 MG capsule; Take 1 capsule (60 mg total) by mouth every morning.  Irritable  Motion sickness, subsequent encounter -     scopolamine (TRANSDERM-SCOP, 1.5 MG,) 1 MG/3DAYS; Place 1 patch (1.5 mg total) onto the skin every 3 (three) days.  refilled vyvanse for 3 months.   For trip given patches. Discussed how to use and side effects.

## 2018-01-21 ENCOUNTER — Other Ambulatory Visit: Payer: Self-pay | Admitting: Obstetrics & Gynecology

## 2018-01-22 NOTE — Telephone Encounter (Signed)
Medication refill request: [facility]  Last AEX:  11/30/16 Next AEX: 02/25/18 Last MMG (if hormonal medication request): 02/22/15 Bi-rads Category 1 neg  Refill authorized: Please refill until AEX if appropriate.

## 2018-02-14 ENCOUNTER — Other Ambulatory Visit: Payer: Self-pay | Admitting: Obstetrics & Gynecology

## 2018-02-14 DIAGNOSIS — Z1239 Encounter for other screening for malignant neoplasm of breast: Secondary | ICD-10-CM

## 2018-02-19 ENCOUNTER — Ambulatory Visit: Payer: BLUE CROSS/BLUE SHIELD

## 2018-02-20 ENCOUNTER — Ambulatory Visit (INDEPENDENT_AMBULATORY_CARE_PROVIDER_SITE_OTHER): Payer: BLUE CROSS/BLUE SHIELD

## 2018-02-20 DIAGNOSIS — Z1231 Encounter for screening mammogram for malignant neoplasm of breast: Secondary | ICD-10-CM | POA: Diagnosis not present

## 2018-02-20 DIAGNOSIS — Z1239 Encounter for other screening for malignant neoplasm of breast: Secondary | ICD-10-CM

## 2018-02-20 IMAGING — MG DIGITAL SCREENING BILATERAL MAMMOGRAM WITH TOMO AND CAD
6 of 12 series · 6 of 36 positions shown · non-contrast
Comparison: Previous exam(s).

CLINICAL DATA: Screening.

EXAM:
DIGITAL SCREENING BILATERAL MAMMOGRAM WITH TOMO AND CAD

[L CC synth-2D (1 of 2)]
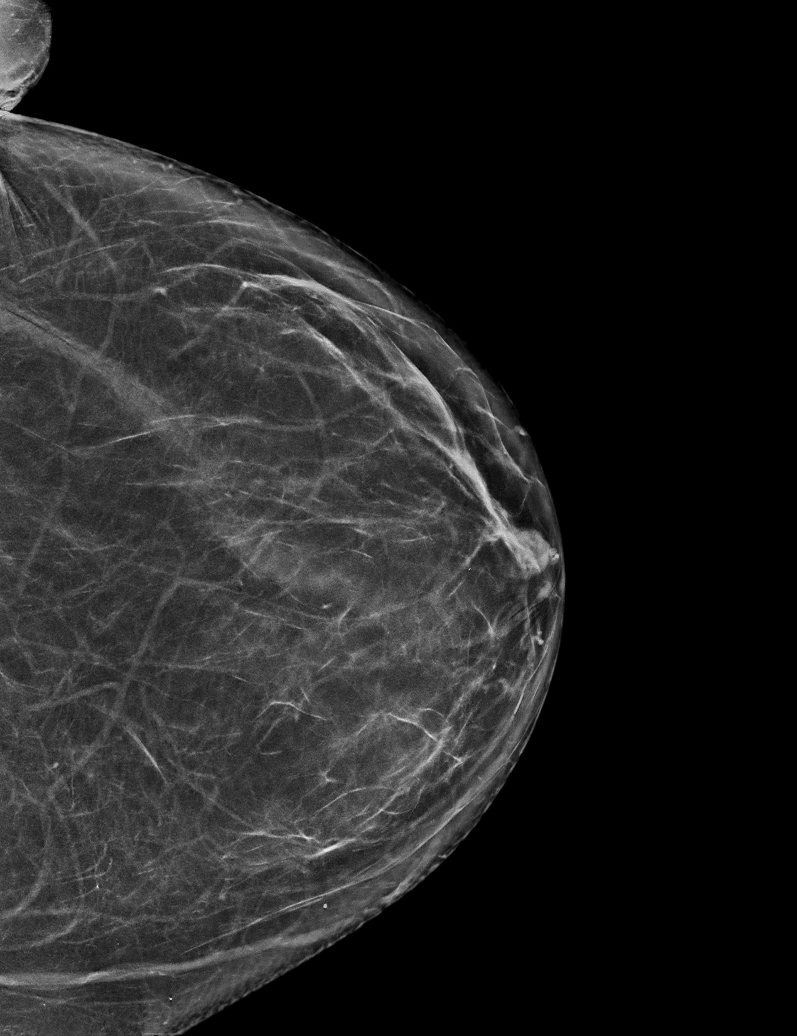

[L MLO synth-2D]
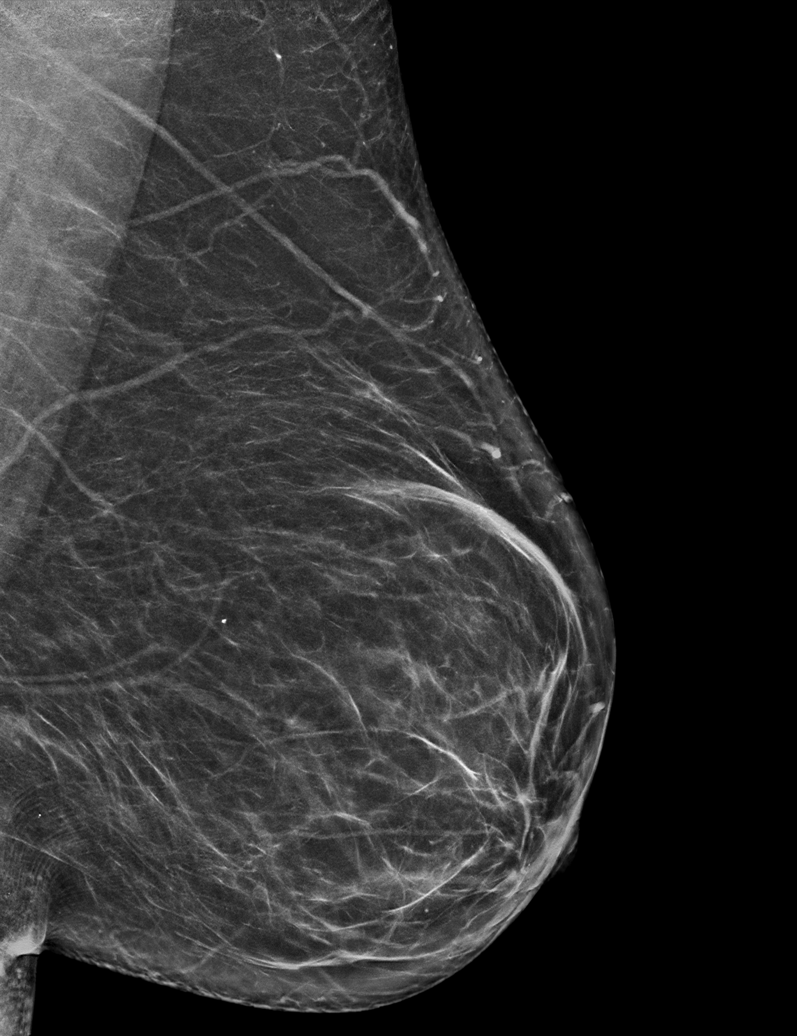

[R CC synth-2D (1 of 2)]
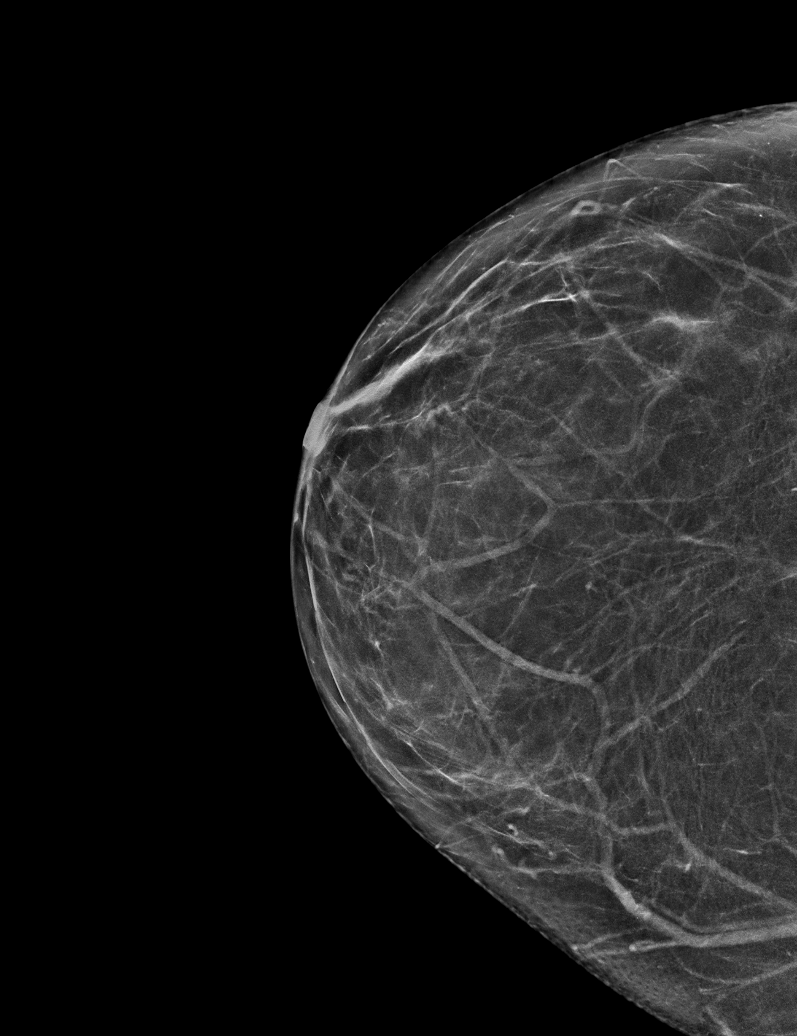

[R CC synth-2D (2 of 2)]
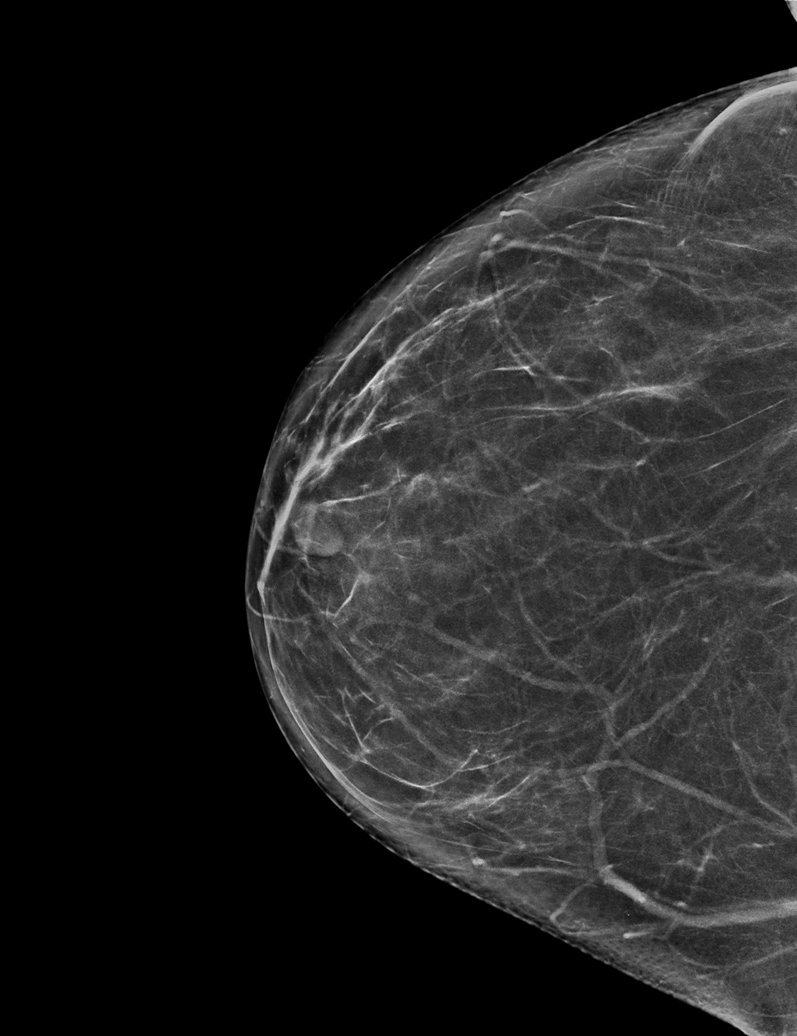

[R MLO synth-2D]
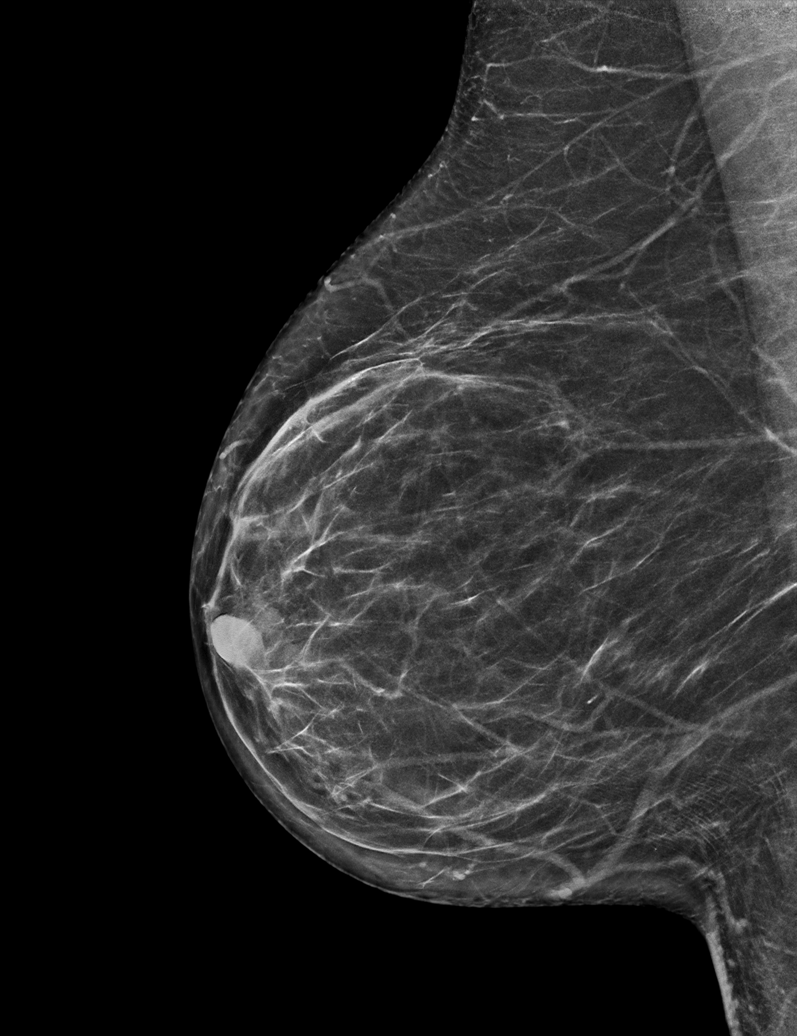

[L CC synth-2D (2 of 2)]
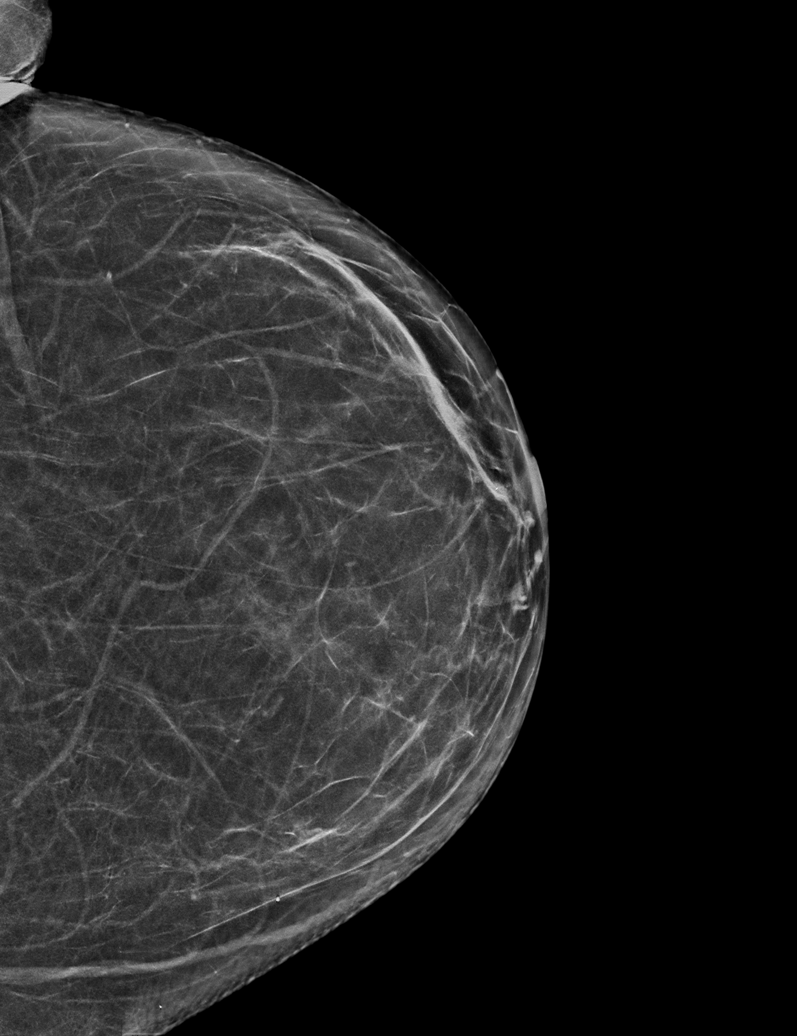

[6 of 36 positions shown; findings below may reference images not displayed]

ACR Breast Density Category b: There are scattered areas of
fibroglandular density.
FINDINGS: There are no findings suspicious for malignancy. Images were
processed with CAD.
IMPRESSION: No mammographic evidence of malignancy. A result letter of this
screening mammogram will be mailed directly to the patient.

RECOMMENDATION:
Screening mammogram in one year. (Code:[TQ])

BI-RADS CATEGORY  1: Negative.

## 2018-02-21 NOTE — Progress Notes (Signed)
Call pt: normal mammogram. Follow up in one year.

## 2018-02-23 ENCOUNTER — Other Ambulatory Visit: Payer: Self-pay | Admitting: Physician Assistant

## 2018-02-23 DIAGNOSIS — I10 Essential (primary) hypertension: Secondary | ICD-10-CM

## 2018-02-23 DIAGNOSIS — M5416 Radiculopathy, lumbar region: Secondary | ICD-10-CM

## 2018-02-25 ENCOUNTER — Ambulatory Visit: Payer: BLUE CROSS/BLUE SHIELD | Admitting: Obstetrics & Gynecology

## 2018-02-25 ENCOUNTER — Encounter

## 2018-03-03 ENCOUNTER — Encounter: Payer: Self-pay | Admitting: Obstetrics & Gynecology

## 2018-03-03 ENCOUNTER — Other Ambulatory Visit (HOSPITAL_COMMUNITY)
Admission: RE | Admit: 2018-03-03 | Discharge: 2018-03-03 | Disposition: A | Discharge status: Home / Self Care | Payer: BLUE CROSS/BLUE SHIELD | Source: Ambulatory Visit | Attending: Obstetrics & Gynecology | Admitting: Obstetrics & Gynecology

## 2018-03-03 ENCOUNTER — Ambulatory Visit: Payer: BLUE CROSS/BLUE SHIELD | Admitting: Obstetrics & Gynecology

## 2018-03-03 ENCOUNTER — Other Ambulatory Visit: Payer: Self-pay

## 2018-03-03 VITALS — BP 100/66 | HR 72 | Resp 14 | Ht 67.0 in | Wt 197.0 lb

## 2018-03-03 DIAGNOSIS — Z01419 Encounter for gynecological examination (general) (routine) without abnormal findings: Secondary | ICD-10-CM

## 2018-03-03 DIAGNOSIS — R1032 Left lower quadrant pain: Secondary | ICD-10-CM | POA: Diagnosis not present

## 2018-03-03 DIAGNOSIS — Z124 Encounter for screening for malignant neoplasm of cervix: Secondary | ICD-10-CM

## 2018-03-03 MED ORDER — ESTRADIOL 0.1 MG/GM VA CREA: TOPICAL_CREAM | VAGINAL | 4 refills | Status: DC

## 2018-03-03 NOTE — Progress Notes (Signed)
57 y.o. G0P0000 Married White or Caucasian female here for annual exam.  Just moved to [place] from [place] to be close to her grandchild.  Moved five days ago.  Still unpacking.  House is all on one level except for a bonus room as she does have some balance issues after having her acoustic neuroma removed 3/17.    Brother, aged 58, has esophageal and stomach cancer diagnosed this year.  Biological father had this as well.  It was recommended that she have an endoscopy.  This was done by Dr. Collene Mares in the late spring.     Denies vaginal bleeding.  Having some worsening back issues with some LLQ pain.  Does have chronic constipation.  On Lizess for this but still has some issues.    No LMP recorded. Patient has had an ablation.          Sexually active: Yes.    The current method of family planning is vasectomy and post menopausal status.    Exercising: Yes.    kickboxing, strength, walking Smoker:  no  Health Maintenance: Pap:  08/12/15 Neg. HR HPV:neg   04/16/13 Neg  History of abnormal Pap:  no MMG:  02/20/18 BIRADS1:Neg  Colonoscopy:  12/15/10 f/u 10 years, endoscopy was earlier this year BMD:   Never TDaP:  2018 Pneumonia vaccine(s):  No Shingrix:   Completed  Hep C testing: 11/30/16 Neg  Screening Labs: PCP   reports that she has never smoked. She has never used smokeless tobacco. She reports that she drinks about 1.0 - 2.0 standard drinks of alcohol per week. She reports that she does not use drugs.  Past Medical History:  Diagnosis Date  . Acoustic neuroma ([place])   . ADD (attention deficit disorder)    and OCD-on Vyvanse  . Asthma    w/out status asthmaticus  . Awareness under anesthesia   . Balance problem    after acoustic neuroma excision  . Benign brain tumor ([place])   . Chronic back pain   . Constipation, chronic    IBS, CIC  . Diabetes mellitus   . Difficult airway   . 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 GERD (gastroesophageal reflux disease)    . Iron deficiency anemia, unspecified 02/18/2013  . Obesity    296 lbs (highest weight in 2012)   . Tendonitis, Achilles, left     Past Surgical History:  Procedure Laterality Date  . ABLATION     and bladder sling  . ACHILLES TENDON REPAIR     x2  . ANAL FISSURE REPAIR  2012  . BACK SURGERY    . CHOLECYSTECTOMY    . CRANIOTOMY  09/13/2015   with excision of acoustic neuroma  . IMPLANTATION BONE ANCHORED HEARING AID    . MOUTH SURGERY  2015   3 implants  . OVARIAN CYST REMOVAL     x4  . THYROID LOBECTOMY Right 04/28/2015   Partial   . TOE FUSION Left     Current Outpatient Medications  Medication Sig Dispense Refill  . albuterol (PROVENTIL HFA;VENTOLIN HFA) 108 (90 Base) MCG/ACT inhaler Inhale 2 puffs into the lungs every 6 (six) hours as needed for wheezing or shortness of breath. 1 Inhaler 2  . budesonide-formoterol (SYMBICORT) 160-4.5 MCG/ACT inhaler INHALE 2 PUFFS INTO THE LUNGS 2 TIMES DAILY 10.2 Inhaler 5  . buPROPion (WELLBUTRIN XL) 300 MG 24 hr tablet TAKE 1 TABLET DAILY 90 tablet 1  . celecoxib (CELEBREX)  200 MG capsule Take 1 capsule (200 mg total) by mouth 2 (two) times daily. 180 capsule 3  . cyclobenzaprine (FLEXERIL) 10 MG tablet TAKE 1 TABLET AT BEDTIME 90 tablet 4  . dexlansoprazole (DEXILANT) 60 MG capsule Take 60 mg by mouth daily.    [name] Kitchen docusate sodium (COLACE) 50 MG capsule Take 100 mg by mouth at bedtime.    [name] Kitchen estradiol (ESTRACE VAGINAL) 0.1 MG/GM vaginal cream APPLY 1 GRAM VAGINALLY TWICE WEEKLY. 42.5 g 0  . levothyroxine (SYNTHROID, LEVOTHROID) 50 MCG tablet TAKE 1 TABLET BEFORE BREAKFAST ON EMPTY STOMACH 90 tablet 3  . LINZESS 290 MCG CAPS capsule Take 1 capsule by mouth daily.    [name] Kitchen lisdexamfetamine (VYVANSE) 60 MG capsule Take 1 capsule (60 mg total) by mouth every morning. 30 capsule 0  . triamterene-hydrochlorothiazide (MAXZIDE-25) 37.5-25 MG tablet TAKE 1 TABLET DAILY 90 tablet 4  . valACYclovir (VALTREX) 1000 MG tablet 1 tab BID x 3 days and then  daily. 36 tablet 1   No current facility-administered medications for this visit.     Family History  Problem Relation Age of Onset  . Thyroid disease Mother   . Heart Problems Mother        vavle issue  . Dystonia Mother   . Hypercholesterolemia Mother   . Heart attack Father   . Cancer Father        esophageal  . Heart disease Father   . Lung cancer Paternal Grandfather   . Heart disease Paternal Grandfather   . Heart disease Maternal Grandfather   . Heart disease Paternal Grandmother   . Heart disease Other        paternal and maternal side  . Cancer Brother        esophageal/stomach cancer    Review of Systems  HENT: Positive for hearing loss.   Gastrointestinal: Positive for constipation.  Musculoskeletal: Positive for myalgias.  Neurological:       Lack of coordination   All other systems reviewed and are negative.   Exam:   BP 100/66 (BP Location: Right Arm, Patient Position: Sitting, Cuff Size: Large)   Pulse 72   Resp 14   Ht 5\' 7"  (1.702 m)   Wt 197 lb (89.4 kg)   BMI 30.85 kg/m     Height: 5\' 7"  (170.2 cm)  Ht Readings from Last 3 Encounters:  03/03/18 5\' 7"  (1.702 m)  12/24/17 5' 7.99" (1.727 m)  09/26/17 5\' 8"  (1.727 m)    General appearance: alert, cooperative and appears stated age Head: Normocephalic, without obvious abnormality, atraumatic Neck: no adenopathy, supple, symmetrical, trachea midline and thyroid normal to inspection and palpation Lungs: clear to auscultation bilaterally Breasts: normal appearance, no masses or tenderness Heart: regular rate and rhythm Abdomen: soft, non-tender; bowel sounds normal; no masses,  no organomegaly Extremities: extremities normal, atraumatic, no cyanosis or edema Skin: Skin color, texture, turgor normal. No rashes or lesions Lymph nodes: Cervical, supraclavicular, and axillary nodes normal. No abnormal inguinal nodes palpated Neurologic: Grossly normal   Pelvic: External genitalia:  no lesions               Urethra:  normal appearing urethra with no masses, tenderness or lesions              Bartholins and Skenes: normal                 Vagina: normal appearing vagina with normal color and discharge, no lesions  Cervix: no lesions              Pap taken: Yes.   Bimanual Exam:  Uterus:  normal size, contour, position, consistency, mobility, non-tender              Adnexa: normal adnexa and no mass, fullness, tenderness               Rectovaginal: Confirms               Anus:  normal sphincter tone, no lesions  Chaperone was present for exam.  A:  Well Woman with normal exam PMP, no HRT H/O endometrial ablation H/O recurrent BV which seemed to resolved with menopause H/O acoustic neuroma excision 2017 with subsequent hearing loss.  Now has bone anchored hearing aid. H/O right thyroid lobectomy LLQ pain Intentional weight loss  P:   Mammogram guidelines reviewed.   pap smear obtained today  RF for Estace vaginal cream 1 gm pv twice weekly.  #42.5gm/4RF Has follow up with Dr. Cruzita Lederer in February Lab work is UTD Release of endoscopy from Dr. Collene Mares signed today Does not need RF for Valtrex Return annually or prn

## 2018-03-04 LAB — CYTOLOGY - PAP
Adequacy: ABSENT
Diagnosis: NEGATIVE

## 2018-03-13 ENCOUNTER — Ambulatory Visit (INDEPENDENT_AMBULATORY_CARE_PROVIDER_SITE_OTHER): Payer: BLUE CROSS/BLUE SHIELD | Admitting: Obstetrics & Gynecology

## 2018-03-13 ENCOUNTER — Encounter: Payer: Self-pay | Admitting: Obstetrics & Gynecology

## 2018-03-13 ENCOUNTER — Ambulatory Visit (INDEPENDENT_AMBULATORY_CARE_PROVIDER_SITE_OTHER): Payer: BLUE CROSS/BLUE SHIELD

## 2018-03-13 VITALS — BP 116/80 | HR 92 | Resp 16 | Ht 67.0 in | Wt 200.0 lb

## 2018-03-13 DIAGNOSIS — R1032 Left lower quadrant pain: Secondary | ICD-10-CM

## 2018-03-13 NOTE — Progress Notes (Signed)
57 y.o. G0P0000 Married White or Caucasian female here for pelvic ultrasound due to LLQ pain that pt was not sure was ovarian in nature or possibly from back issues.  Wanted to proceed with PUS first.  Here for this today.  Denies vaginal bleeding.  No LMP recorded. Patient has had an ablation.  Contraception: PMP  Findings:  UTERUS: 5.7 x 3.5 x 2.6cm with several small fibroids all <1cm EMS: 2.75mm, irregular with hx of ablation ADNEXA: Left ovary:  2.0 x 1.3 x 1.1cm       Right ovary: 1.7 x 1.4 x 1.4cm CUL DE SAC: no free fluid  Discussion:  Images reviewed with pt.  Ovaries appear normal.  She is pleased with this and will follow up with ortho at this point.  Does not need help with appointments.  Questions answered.  Assessment:  LLQ pain with normal gyn PUS  Plan:  Return for AEX as is already scheduled for 2020  ~15 minutes spent with patient >50% of time was in face to face discussion of above.

## 2018-03-26 ENCOUNTER — Ambulatory Visit: Payer: BLUE CROSS/BLUE SHIELD | Admitting: Physician Assistant

## 2018-03-26 ENCOUNTER — Encounter: Payer: Self-pay | Admitting: Physician Assistant

## 2018-03-26 VITALS — BP 124/81 | HR 79 | Ht 67.0 in | Wt 199.0 lb

## 2018-03-26 DIAGNOSIS — M5416 Radiculopathy, lumbar region: Secondary | ICD-10-CM

## 2018-03-26 DIAGNOSIS — F900 Attention-deficit hyperactivity disorder, predominantly inattentive type: Secondary | ICD-10-CM

## 2018-03-26 MED ORDER — PREDNISONE 50 MG PO TABS: ORAL_TABLET | ORAL | 0 refills | Status: DC

## 2018-03-26 MED ORDER — LISDEXAMFETAMINE DIMESYLATE 60 MG PO CAPS: 60.0000 mg | ORAL_CAPSULE | ORAL | 0 refills | Status: DC

## 2018-03-26 NOTE — Progress Notes (Signed)
Subjective:    Patient ID: Cynthia [name], female    DOB: 1961-01-16, 57 y.o.   MRN: [id number]  HPI Pt is a 57 yo female with HTN, asthma, GERD, ADD who presents to the clinic with 3 month follow up.   She is doing well with vyvanse. She denies any headaches, vision changes, anxiety or insomnia.   She has had some flaring of her left lumbar radiculopathy. She has been doing great with massage therapy. She admits she has been lifting her grandchild and moving boxes. No saddle anesthesia, bowel or bladder dysfunction. No lower extermity weakness. She is on celebrex   .[name] Kitchen Active Ambulatory Problems    Diagnosis Date Noted  . Iron deficiency anemia, unspecified 02/18/2013  . Swelling of limb 09/13/2013  . Interdigital neuralgia 04/14/2013  . 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 ADD (attention deficit disorder) 05/11/2014  . Low back pain 05/11/2014  . Chronic constipation 05/11/2014  . Essential (primary) hypertension 05/11/2014  . Gastro-esophageal reflux disease without esophagitis 05/11/2014  . Asthma, mild persistent 05/11/2014  . 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 Hypokalemia 02/13/2015  . 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 Hashimoto's thyroiditis 06/14/2015  . Subconjunctival hemorrhage of right eye 06/28/2015  . 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 Costochondritis 03/21/2016  . Left lumbar radiculopathy 09/19/2016  . Fatty liver disease, nonalcoholic 83/41/9622  . Left renal mass 03/26/2017  . Obesity (BMI 30.0-34.9) 03/26/2017  . 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 Motion sickness 12/26/2017   Resolved Ambulatory Problems    Diagnosis Date Noted  . IFG (impaired fasting glucose) 09/13/2013  . Backache 09/13/2013  . Wheezing 09/13/2013  . Obesity, unspecified 09/13/2013  . Thyromegaly 02/13/2015  . Acoustic neuroma ([place]) 12/10/2014  . Left ear hearing loss 10/14/2015  . Irritable 10/17/2016  . Thyroid  adenoma 05/03/2015   Past Medical History:  Diagnosis Date  . Asthma   . Awareness under anesthesia   . Balance problem   . Benign brain tumor ([place])   . Chronic back pain   . Constipation, chronic   . Diabetes mellitus   . Difficult airway   . 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  . GERD (gastroesophageal reflux disease)   . Obesity   . Tendonitis, Achilles, left       Review of Systems See HPI.     Objective:   Physical Exam  Constitutional: She is oriented to person, place, and time. She appears well-developed and well-nourished.  HENT:  Head: Normocephalic and atraumatic.  Cardiovascular: Normal rate and regular rhythm.  Pulmonary/Chest: Effort normal and breath sounds normal.  Musculoskeletal:  NROM at waist.  Negative straight leg raise.  5/5 lower extremity strength.  Tenderness over left lower buttock.   Neurological: She is alert and oriented to person, place, and time.  Skin: No rash noted.  Psychiatric: She has a normal mood and affect. Her behavior is normal.          Assessment & Plan:  [name] KitchenMarland KitchenDiagnoses and all orders for this visit:  Attention deficit hyperactivity disorder (ADHD), predominantly inattentive type -     lisdexamfetamine (VYVANSE) 60 MG capsule; Take 1 capsule (60 mg total) by mouth every morning. -     lisdexamfetamine (VYVANSE) 60 MG capsule; Take 1 capsule (60 mg total) by mouth every morning. -     lisdexamfetamine (VYVANSE) 60 MG capsule; Take 1 capsule (60 mg total) by mouth every morning.  Left lumbar radiculopathy -  predniSONE (DELTASONE) 50 MG tablet; One tab PO daily for 5 days.   3 months of vyvanse.   Prednisone burst. Continue low back exercises/massage. Continue celebrex. Encouraged heat. Follow up with sports medicine as needed. Continue to work on weight loss.

## 2018-04-07 ENCOUNTER — Encounter: Payer: Self-pay | Admitting: Physician Assistant

## 2018-04-09 ENCOUNTER — Encounter: Payer: Self-pay | Admitting: Sports Medicine

## 2018-04-09 ENCOUNTER — Ambulatory Visit: Payer: BLUE CROSS/BLUE SHIELD | Admitting: Sports Medicine

## 2018-04-09 ENCOUNTER — Ambulatory Visit: Payer: BLUE CROSS/BLUE SHIELD | Admitting: Physician Assistant

## 2018-04-09 DIAGNOSIS — M5416 Radiculopathy, lumbar region: Secondary | ICD-10-CM | POA: Diagnosis not present

## 2018-04-09 MED ORDER — PREDNISONE 50 MG PO TABS: ORAL_TABLET | ORAL | 0 refills | Status: DC

## 2018-04-09 NOTE — Assessment & Plan Note (Signed)
Axial back pain with occasional radiation to the left thigh, tenderness over the left sacroiliac joint. Diagnostic and therapeutic left sacroiliac joint injection today. She did have an MRI in 2015 at [facility] that showed a wide disc protrusion at L4-L5 abutting both left and right L5 nerve roots, more on the left. She also has a history of a hemilaminectomy at L5-S1 with severe foraminal narrowing on the left and scar tissue surrounding the left S1 nerve root. 5 days of prednisone. All of the above structures are potential pain generators so we will take them out one at a time. Return in 1 month.

## 2018-04-09 NOTE — Progress Notes (Signed)
Subjective:    CC: Low back pain  HPI: This is a pleasant 57 year old female, she is post vestibular neuronectomy, as well as left L5-S1 hemilaminectomy.  I saw her sometime ago with axial back pain with left-sided radicular symptoms, this responded well to aggressive formal physical therapy.  She is having a recurrence of pain in her back, left-sided with radiation to the buttock and thigh but not past the knee, nothing overtly radicular down to the foot.  Worse with prolonged standing, not bad with driving, sitting, Valsalva.  No bowel or bladder dysfunction, no saddle numbness, constitutional symptoms, no trauma.  I reviewed the past medical history, family history, social history, surgical history, and allergies today and no changes were needed.  Please see the problem list section below in epic for further details.  Past Medical History: Past Medical History:  Diagnosis Date  . Acoustic neuroma ([place])   . ADD (attention deficit disorder)    and OCD-on Vyvanse  . Asthma    w/out status asthmaticus  . Awareness under anesthesia   . Balance problem    after acoustic neuroma excision  . Benign brain tumor ([place])   . Chronic back pain   . Constipation, chronic    IBS, CIC  . Diabetes mellitus   . Difficult airway   . 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 GERD (gastroesophageal reflux disease)   . Iron deficiency anemia, unspecified 02/18/2013  . Obesity    296 lbs (highest weight in 2012)   . Tendonitis, Achilles, left    Past Surgical History: Past Surgical History:  Procedure Laterality Date  . ABLATION     and bladder sling  . ACHILLES TENDON REPAIR     x2  . ANAL FISSURE REPAIR  2012  . BACK SURGERY    . CHOLECYSTECTOMY    . CRANIOTOMY  09/13/2015   with excision of acoustic neuroma  . IMPLANTATION BONE ANCHORED HEARING AID    . MOUTH SURGERY  2015   3 implants  . OVARIAN CYST REMOVAL     x4  . THYROID LOBECTOMY Right 04/28/2015   Partial   . TOE  FUSION Left    Social History: Social History   Socioeconomic History  . Marital status: Married    Spouse name: Not on file  . Number of children: Not on file  . Years of education: Not on file  . Highest education level: Not on file  Occupational History  . Not on file  Social Needs  . Financial resource strain: Not on file  . Food insecurity:    Worry: Not on file    Inability: Not on file  . Transportation needs:    Medical: Not on file    Non-medical: Not on file  Tobacco Use  . Smoking status: Never Smoker  . Smokeless tobacco: Never Used  Substance and Sexual Activity  . Alcohol use: Yes    Alcohol/week: 1.0 - 2.0 standard drinks    Types: 1 - 2 Standard drinks or equivalent per week  . Drug use: No  . Sexual activity: Yes    Partners: Male    Birth control/protection: Other-see comments    Comment: vasectomy  Lifestyle  . Physical activity:    Days per week: Not on file    Minutes per session: Not on file  . Stress: Not on file  Relationships  . Social connections:    Talks on phone: Not on file  Gets together: Not on file    Attends religious service: Not on file    Active member of club or organization: Not on file    Attends meetings of clubs or organizations: Not on file    Relationship status: Not on file  Other Topics Concern  . Not on file  Social History Narrative   Marital Status:  Married Information systems manager)    Children:  None    Pets: Dog (01)    Living Situation: Lives with husband     Occupation:  IT - [employer]    Education:  Master's Degree    Tobacco Use/Exposure:  None    Alcohol Use:  Occasional   Drug Use:  None   Diet:  Regular   Exercise:  Kick-boxing and boot camp - 5 times per week    Hobbies: Golf, tennis and working out               Family History: Family History  Problem Relation Age of Onset  . Thyroid disease Mother   . Heart Problems Mother        vavle issue  . Dystonia Mother   .  Hypercholesterolemia Mother   . Heart attack Father   . Cancer Father        esophageal  . Heart disease Father   . Lung cancer Paternal Grandfather   . Heart disease Paternal Grandfather   . Heart disease Maternal Grandfather   . Heart disease Paternal Grandmother   . Heart disease Other        paternal and maternal side  . Cancer Brother        esophageal/stomach cancer   Allergies: Allergies  Allergen Reactions  . Mobic [Meloxicam] Nausea And Vomiting  . Multivitamins Other (See Comments)   Medications: See med rec.  Review of Systems: No fevers, chills, night sweats, weight loss, chest pain, or shortness of breath.   Objective:    General: Well Developed, well nourished, and in no acute distress.  Neuro: Alert and oriented x3, extra-ocular muscles intact, sensation grossly intact.  HEENT: Normocephalic, atraumatic, pupils equal round reactive to light, neck supple, no masses, no lymphadenopathy, thyroid nonpalpable.  Skin: Warm and dry, no rashes. Cardiac: Regular rate and rhythm, no murmurs rubs or gallops, no lower extremity edema.  Respiratory: Clear to auscultation bilaterally. Not using accessory muscles, speaking in full sentences. Back Exam:  Inspection: Unremarkable  Motion: Flexion 45 deg, Extension 45 deg, Side Bending to 45 deg bilaterally,  Rotation to 45 deg bilaterally  SLR laying: Negative  XSLR laying: Negative  Palpable tenderness: Left sacroiliac joint. FABER: negative. Sensory change: Gross sensation intact to all lumbar and sacral dermatomes.  Reflexes: 2+ at both patellar tendons, 2+ at achilles tendons, Babinski's downgoing.  Strength at foot  Plantar-flexion: 5/5 Dorsi-flexion: 5/5 Eversion: 5/5 Inversion: 5/5  Leg strength  Quad: 5/5 Hamstring: 5/5 Hip flexor: 5/5 Hip abductors: 5/5  Gait unremarkable.  Procedure: Real-time Ultrasound Guided Injection of left sacroiliac joint Device: GE Logiq E  Verbal informed consent obtained.  Time-out  conducted.  Noted no overlying erythema, induration, or other signs of local infection.  Skin prepped in a sterile fashion.  Local anesthesia: Topical Ethyl chloride.  With sterile technique and under real time ultrasound guidance: Using a 22-gauge spinal needle advanced towards the SI joint, and taking care to avoid the S1 foramen I dropped needle into the joint and injected 1 cc Kenalog 40, 2 cc lidocaine, 2 cc bupivacaine.  The patient experienced concordant pain during the injection and complete relief of pain 2 minutes later. Completed without difficulty  Pain immediately resolved suggesting accurate placement of the medication.  Advised to call if fevers/chills, erythema, induration, drainage, or persistent bleeding.  Images permanently stored and available for review in the ultrasound unit.  Impression: Technically successful ultrasound guided injection.  Impression and Recommendations:    Left lumbar radiculopathy Axial back pain with occasional radiation to the left thigh, tenderness over the left sacroiliac joint. Diagnostic and therapeutic left sacroiliac joint injection today. She did have an MRI in 2015 at [facility] that showed a wide disc protrusion at L4-L5 abutting both left and right L5 nerve roots, more on the left. She also has a history of a hemilaminectomy at L5-S1 with severe foraminal narrowing on the left and scar tissue surrounding the left S1 nerve root. 5 days of prednisone. All of the above structures are potential pain generators so we will take them out one at a time. Return in 1 month. ___________________________________________ Gwen Her. Dianah Field, M.D., ABFM., CAQSM. Primary Care and Sports Medicine [facility]  Adjunct Professor of [place] [employer]

## 2018-04-18 ENCOUNTER — Ambulatory Visit: Payer: BLUE CROSS/BLUE SHIELD | Admitting: Obstetrics & Gynecology

## 2018-04-18 ENCOUNTER — Encounter

## 2018-05-07 ENCOUNTER — Ambulatory Visit: Payer: BLUE CROSS/BLUE SHIELD | Admitting: Sports Medicine

## 2018-05-07 ENCOUNTER — Encounter: Payer: Self-pay | Admitting: Sports Medicine

## 2018-05-07 DIAGNOSIS — M545 Low back pain, unspecified: Secondary | ICD-10-CM

## 2018-05-07 DIAGNOSIS — G8929 Other chronic pain: Secondary | ICD-10-CM

## 2018-05-07 DIAGNOSIS — Z20828 Contact with and (suspected) exposure to other viral communicable diseases: Secondary | ICD-10-CM | POA: Diagnosis not present

## 2018-05-07 MED ORDER — OSELTAMIVIR PHOSPHATE 75 MG PO CAPS: 75.0000 mg | ORAL_CAPSULE | Freq: Every day | ORAL | 0 refills | Status: DC

## 2018-05-07 NOTE — Progress Notes (Signed)
Subjective:    CC: Follow-up  HPI: Low back pain: Completely resolved now after SI joint injection.  I reviewed the past medical history, family history, social history, surgical history, and allergies today and no changes were needed.  Please see the problem list section below in epic for further details.  Past Medical History: Past Medical History:  Diagnosis Date  . Acoustic neuroma ([place])   . ADD (attention deficit disorder)    and OCD-on Vyvanse  . Asthma    w/out status asthmaticus  . Awareness under anesthesia   . Balance problem    after acoustic neuroma excision  . Benign brain tumor ([place])   . Chronic back pain   . Constipation, chronic    IBS, CIC  . Diabetes mellitus   . Difficult airway   . 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 GERD (gastroesophageal reflux disease)   . Iron deficiency anemia, unspecified 02/18/2013  . Obesity    296 lbs (highest weight in 2012)   . Tendonitis, Achilles, left    Past Surgical History: Past Surgical History:  Procedure Laterality Date  . ABLATION     and bladder sling  . ACHILLES TENDON REPAIR     x2  . ANAL FISSURE REPAIR  2012  . BACK SURGERY    . CHOLECYSTECTOMY    . CRANIOTOMY  09/13/2015   with excision of acoustic neuroma  . IMPLANTATION BONE ANCHORED HEARING AID    . MOUTH SURGERY  2015   3 implants  . OVARIAN CYST REMOVAL     x4  . THYROID LOBECTOMY Right 04/28/2015   Partial   . TOE FUSION Left    Social History: Social History   Socioeconomic History  . Marital status: Married    Spouse name: Not on file  . Number of children: Not on file  . Years of education: Not on file  . Highest education level: Not on file  Occupational History  . Not on file  Social Needs  . Financial resource strain: Not on file  . Food insecurity:    Worry: Not on file    Inability: Not on file  . Transportation needs:    Medical: Not on file    Non-medical: Not on file  Tobacco Use  . Smoking  status: Never Smoker  . Smokeless tobacco: Never Used  Substance and Sexual Activity  . Alcohol use: Yes    Alcohol/week: 1.0 - 2.0 standard drinks    Types: 1 - 2 Standard drinks or equivalent per week  . Drug use: No  . Sexual activity: Yes    Partners: Male    Birth control/protection: Other-see comments    Comment: vasectomy  Lifestyle  . Physical activity:    Days per week: Not on file    Minutes per session: Not on file  . Stress: Not on file  Relationships  . Social connections:    Talks on phone: Not on file    Gets together: Not on file    Attends religious service: Not on file    Active member of club or organization: Not on file    Attends meetings of clubs or organizations: Not on file    Relationship status: Not on file  Other Topics Concern  . Not on file  Social History Narrative   Marital Status:  Married [name])    Children:  None    Pets: Dog (01)    Living Situation: Lives  with husband     Occupation:  IT - Advertising account executive    Education:  Master's Degree    Tobacco Use/Exposure:  None    Alcohol Use:  Occasional   Drug Use:  None   Diet:  Regular   Exercise:  Kick-boxing and boot camp - 5 times per week    Hobbies: Golf, tennis and working out               Family History: Family History  Problem Relation Age of Onset  . Thyroid disease Mother   . Heart Problems Mother        vavle issue  . Dystonia Mother   . Hypercholesterolemia Mother   . Heart attack Father   . Cancer Father        esophageal  . Heart disease Father   . Lung cancer Paternal Grandfather   . Heart disease Paternal Grandfather   . Heart disease Maternal Grandfather   . Heart disease Paternal Grandmother   . Heart disease Other        paternal and maternal side  . Cancer Brother        esophageal/stomach cancer   Allergies: Allergies  Allergen Reactions  . Mobic [Meloxicam] Nausea And Vomiting  . Multivitamins Other (See Comments)   Medications: See  med rec.  Review of Systems: No fevers, chills, night sweats, weight loss, chest pain, or shortness of breath.   Objective:    General: Well Developed, well nourished, and in no acute distress.  Neuro: Alert and oriented x3, extra-ocular muscles intact, sensation grossly intact.  HEENT: Normocephalic, atraumatic, pupils equal round reactive to light, neck supple, no masses, no lymphadenopathy, thyroid nonpalpable.  Skin: Warm and dry, no rashes. Cardiac: Regular rate and rhythm, no murmurs rubs or gallops, no lower extremity edema.  Respiratory: Clear to auscultation bilaterally. Not using accessory muscles, speaking in full sentences.  Impression and Recommendations:    Chronic left-sided low back pain Had axial back pain with occasional radiation to the left thigh and tenderness over the left sacroiliac joint, this was injected 1 month ago and she returns today pain-free, suggesting that the sacroiliac joint is the pain generating structure. She did of course have an MRI in 2015 at [facility], with a wide disc protrusion at L4-L5 abutting both left and right L5 nerve roots more on the left. History of hemilaminectomy at L5-S1 with severe foraminal narrowing on the left and scar tissue surrounding the left S1 nerve root. We were taking a structures one at a time but it seems as though the left sacroiliac joint is the principal pain generator. Continue being active, she is taking a kickboxing class now without any problems.  Exposure to influenza No symptoms so starting the Tamiflu prophylactic dosing. We are also going to give her her flu shot, she did get an odd paresthesia down the arm last time, we will be giving it into the gluteus maximus this time. ___________________________________________ Gwen Her. Dianah Field, M.D., ABFM., CAQSM. Primary Care and Sports Medicine [facility]  Adjunct Professor of [place] [employer] [facility] of  Medicine

## 2018-05-07 NOTE — Assessment & Plan Note (Signed)
Had axial back pain with occasional radiation to the left thigh and tenderness over the left sacroiliac joint, this was injected 1 month ago and she returns today pain-free, suggesting that the sacroiliac joint is the pain generating structure. She did of course have an MRI in 2015 at [facility], with a wide disc protrusion at L4-L5 abutting both left and right L5 nerve roots more on the left. History of hemilaminectomy at L5-S1 with severe foraminal narrowing on the left and scar tissue surrounding the left S1 nerve root. We were taking a structures one at a time but it seems as though the left sacroiliac joint is the principal pain generator. Continue being active, she is taking a kickboxing class now without any problems.

## 2018-05-07 NOTE — Assessment & Plan Note (Signed)
No symptoms so starting the Tamiflu prophylactic dosing. We are also going to give her her flu shot, she did get an odd paresthesia down the arm last time, we will be giving it into the gluteus maximus this time.

## 2018-05-16 ENCOUNTER — Emergency Department (INDEPENDENT_AMBULATORY_CARE_PROVIDER_SITE_OTHER)
Admission: EM | Admit: 2018-05-16 | Discharge: 2018-05-16 | Disposition: A | Discharge status: Home / Self Care | Payer: BLUE CROSS/BLUE SHIELD | Source: Home / Self Care | Attending: Family Medicine | Admitting: Family Medicine

## 2018-05-16 ENCOUNTER — Other Ambulatory Visit: Payer: Self-pay

## 2018-05-16 DIAGNOSIS — G8929 Other chronic pain: Secondary | ICD-10-CM | POA: Diagnosis not present

## 2018-05-16 DIAGNOSIS — M545 Low back pain, unspecified: Secondary | ICD-10-CM

## 2018-05-16 MED ORDER — PREDNISONE 50 MG PO TABS: 50.0000 mg | ORAL_TABLET | Freq: Every day | ORAL | 0 refills | Status: DC

## 2018-05-16 MED ORDER — METHYLPREDNISOLONE ACETATE 80 MG/ML IJ SUSP
80.0000 mg | Freq: Once | INTRAMUSCULAR | Status: AC
  Administered 2018-05-16: 80 mg via INTRAMUSCULAR

## 2018-05-16 NOTE — ED Triage Notes (Signed)
Pt c/o severe back pain since Wed. Has seen Dr T for same problem in past. Was given injection in Oct. Especially hurts going from sitting to standing. Pain level 9/10. Mentions seeing a new massage therapist for last two months and that's when the back pain started after her last visit.

## 2018-05-16 NOTE — ED Provider Notes (Signed)
[facility]yn[name] C[facility]    CSN: [id number] Arrival date & time: 05/16/18  1426     History   Chief Complaint Chief Complaint  Patient presents with  . Back Pain    HPI Cynthia [name] is a 57 y.o. female.   HPI  Cynthia [name] is a 57 y.o. female presenting to UC with c/o severe low back pain, worse on Left side.  Hx of chronic low back pain; she is followed by Dr. Dianah Field.  He gave her a steroid shot in SI joint about 1 month ago. Pain had resolved until 2 days ago when she went to her massage therapist. she also reports having to put her dog down today and had to lift her dog earlier but states her pain started prior to that. Pain is 6/62, worse with certain movements or certain positions, sharp stabbing. No change in bowel or bladder habits.    Past Medical History:  Diagnosis Date  . Acoustic neuroma ([place])   . ADD (attention deficit disorder)    and OCD-on Vyvanse  . Asthma    w/out status asthmaticus  . Awareness under anesthesia   . Balance problem    after acoustic neuroma excision  . Benign brain tumor ([place])   . Chronic back pain   . Constipation, chronic    IBS, CIC  . Diabetes mellitus   . Difficult airway   . 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 GERD (gastroesophageal reflux disease)   . Iron deficiency anemia, unspecified 02/18/2013  . Obesity    296 lbs (highest weight in 2012)   . Tendonitis, Achilles, left     Patient Active Problem List   Diagnosis Date Noted  . Exposure to influenza 05/07/2018  . Motion sickness 12/26/2017  . 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 Fatty liver disease, nonalcoholic 94/76/5465  . Left renal mass 03/26/2017  . Obesity (BMI 30.0-34.9) 03/26/2017  . Chronic left-sided low back pain 09/19/2016  . Costochondritis 03/21/2016  . 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 Subconjunctival hemorrhage of right eye 06/28/2015  . Hashimoto's thyroiditis 06/14/2015    . 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 Hypokalemia 02/13/2015  . 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 ADD (attention deficit disorder) 05/11/2014  . Chronic constipation 05/11/2014  . Essential (primary) hypertension 05/11/2014  . Gastro-esophageal reflux disease without esophagitis 05/11/2014  . Asthma, mild persistent 05/11/2014  . Swelling of limb 09/13/2013  . Interdigital neuralgia 04/14/2013  . Iron deficiency anemia, unspecified 02/18/2013    Past Surgical History:  Procedure Laterality Date  . ABLATION     and bladder sling  . ACHILLES TENDON REPAIR     x2  . ANAL FISSURE REPAIR  2012  . BACK SURGERY    . CHOLECYSTECTOMY    . CRANIOTOMY  09/13/2015   with excision of acoustic neuroma  . IMPLANTATION BONE ANCHORED HEARING AID    . MOUTH SURGERY  2015   3 implants  . OVARIAN CYST REMOVAL     x4  . THYROID LOBECTOMY Right 04/28/2015   Partial   . TOE FUSION Left     OB History    Gravida  0   Para  0   Term  0   Preterm  0   AB  0   Living  0     SAB  0   TAB  0   Ectopic  0   Multiple  0   Live Births               Home Medications    Prior to Admission medications   Medication Sig Start Date End Date Taking? Authorizing Provider  albuterol (PROVENTIL HFA;VENTOLIN HFA) 108 (90 Base) MCG/ACT inhaler Inhale 2 puffs into the lungs every 6 (six) hours as needed for wheezing or shortness of breath. 10/17/16   Breeback, Jade L, PA-C  budesonide-formoterol (SYMBICORT) 160-4.5 MCG/ACT inhaler INHALE 2 PUFFS INTO THE LUNGS 2 TIMES DAILY 03/26/17   Breeback, Jade L, PA-C  buPROPion (WELLBUTRIN XL) 300 MG 24 hr tablet TAKE 1 TABLET DAILY 11/18/17   Breeback, Jade L, PA-C  celecoxib (CELEBREX) 200 MG capsule Take 1 capsule (200 mg total) by mouth 2 (two) times daily. 12/24/17   Breeback, Jade L, PA-C  cyclobenzaprine (FLEXERIL) 10 MG tablet TAKE 1 TABLET AT BEDTIME 02/25/18   Breeback, Jade L, PA-C   dexlansoprazole (DEXILANT) 60 MG capsule Take 60 mg by mouth daily.    [provider]  docusate sodium (COLACE) 50 MG capsule Take 100 mg by mouth at bedtime.    [provider]  estradiol (ESTRACE) 0.1 MG/GM vaginal cream 1 gram vaginally twice weekly 03/03/18   Megan Salon, MD  levothyroxine (SYNTHROID, LEVOTHROID) 50 MCG tablet TAKE 1 TABLET BEFORE BREAKFAST ON EMPTY STOMACH 07/24/17   Philemon Kingdom, MD  LINZESS 290 MCG CAPS capsule Take 1 capsule by mouth daily. 09/23/16   [provider]  lisdexamfetamine (VYVANSE) 60 MG capsule Take 1 capsule (60 mg total) by mouth every morning. 05/26/18   Breeback, Royetta Car, PA-C  oseltamivir (TAMIFLU) 75 MG capsule Take 1 capsule (75 mg total) by mouth daily. 05/07/18   Silverio Decamp, MD  predniSONE (DELTASONE) 50 MG tablet Take 1 tablet (50 mg total) by mouth daily with breakfast. 05/16/18   Noe Gens, PA-C  triamterene-hydrochlorothiazide (MAXZIDE-25) 37.5-25 MG tablet TAKE 1 TABLET DAILY 02/25/18   Breeback, Jade L, PA-C  valACYclovir (VALTREX) 1000 MG tablet 1 tab BID x 3 days and then daily. 10/17/17   Megan Salon, MD    Family History Family History  Problem Relation Age of Onset  . Thyroid disease Mother   . Heart Problems Mother        vavle issue  . Dystonia Mother   . Hypercholesterolemia Mother   . Heart attack Father   . Cancer Father        esophageal  . Heart disease Father   . Lung cancer Paternal Grandfather   . Heart disease Paternal Grandfather   . Heart disease Maternal Grandfather   . Heart disease Paternal Grandmother   . Heart disease Other        paternal and maternal side  . Cancer Brother        esophageal/stomach cancer    Social History Social History   Tobacco Use  . Smoking status: Never Smoker  . Smokeless tobacco: Never Used  Substance Use Topics  . Alcohol use: Yes    Alcohol/week: 1.0 - 2.0 standard drinks    Types: 1 - 2 Standard drinks or equivalent per  week  . Drug use: No     Allergies   Mobic [meloxicam] and Multivitamins   Review of Systems Review of Systems  Genitourinary: Negative for dysuria, flank pain, frequency and hematuria.  Musculoskeletal: Positive for back pain and myalgias.  Neurological: Negative for weakness  and numbness.     Physical Exam Triage Vital Signs ED Triage Vitals  Enc Vitals Group     BP 05/16/18 1439 (!) 152/87     Pulse Rate 05/16/18 1439 76     Resp --      Temp 05/16/18 1439 97.7 F (36.5 C)     Temp Source 05/16/18 1439 Oral     SpO2 05/16/18 1439 99 %     Weight 05/16/18 1441 201 lb (91.2 kg)     Height --      Head Circumference --      Peak Flow --      Pain Score 05/16/18 1440 9     Pain Loc --      Pain Edu? --      Excl. in [place]? --    No data found.  Updated Vital Signs BP (!) 152/87 (BP Location: Right Arm)   Pulse 76   Temp 97.7 F (36.5 C) (Oral)   Wt 201 lb (91.2 kg)   SpO2 99%   BMI 31.48 kg/m   Visual Acuity Right Eye Distance:   Left Eye Distance:   Bilateral Distance:    Right Eye Near:   Left Eye Near:    Bilateral Near:     Physical Exam  Constitutional: She is oriented to person, place, and time. She appears well-developed and well-nourished.  HENT:  Head: Normocephalic and atraumatic.  Mouth/Throat: Oropharynx is clear and moist.  Eyes: EOM are normal.  Neck: Normal range of motion.  Cardiovascular: Normal rate.  Pulmonary/Chest: Effort normal. No respiratory distress.  Musculoskeletal: Normal range of motion. She exhibits tenderness. She exhibits no edema.  Tenderness to lower lumbar spine and Left side lumbar muscles and buttock.  Negative straight leg raise. Normal gait.   Neurological: She is alert and oriented to person, place, and time.  Skin: Skin is warm and dry. No rash noted.  Psychiatric: She has a normal mood and affect. Her behavior is normal.  Nursing note and vitals reviewed.    UC Treatments / Results  Labs (all labs  ordered are listed, but only abnormal results are displayed) Labs Reviewed - No data to display  EKG None  Radiology No results found.  Procedures Procedures (including critical care time)  Medications Ordered in UC Medications  methylPREDNISolone acetate (DEPO-MEDROL) injection 80 mg (80 mg Intramuscular Given 05/16/18 1503)    Initial Impression / Assessment and Plan / UC Course  I have reviewed the triage vital signs and the nursing notes.  Pertinent labs & imaging results that were available during my care of the patient were reviewed by me and considered in my medical decision making (see chart for details).     Exacerbation of low back pain w/o red flag symptoms. Home care info provided.  Final Clinical Impressions(s) / UC Diagnoses   Final diagnoses:  Acute exacerbation of chronic low back pain     Discharge Instructions      You were given a shot of depo-medrol (a steroid) today to help with muscle pain and swelling.  You have been prescribed prednisone, an oral steroid.  You may start this medication tomorrow with breakfast.       ED Prescriptions    Medication Sig Dispense Auth. Provider   predniSONE (DELTASONE) 50 MG tablet Take 1 tablet (50 mg total) by mouth daily with breakfast. 5 tablet Noe Gens, PA-C     Controlled Substance Prescriptions [place] Controlled Substance Registry consulted? Not Applicable  Noe Gens, [place] 05/16/18 1542

## 2018-05-16 NOTE — Discharge Instructions (Signed)
°  You were given a shot of depo-medrol (a steroid) today to help with muscle pain and swelling.  You have been prescribed prednisone, an oral steroid.  You may start this medication tomorrow with breakfast.

## 2018-06-05 ENCOUNTER — Encounter: Payer: Self-pay | Admitting: Obstetrics & Gynecology

## 2018-06-05 ENCOUNTER — Other Ambulatory Visit: Payer: Self-pay | Admitting: Obstetrics & Gynecology

## 2018-06-05 ENCOUNTER — Telehealth: Payer: Self-pay | Admitting: Obstetrics & Gynecology

## 2018-06-05 MED ORDER — ESTRADIOL 0.1 MG/GM VA CREA: TOPICAL_CREAM | VAGINAL | 6 refills | Status: DC

## 2018-06-05 NOTE — Telephone Encounter (Signed)
Patient sent the following correspondence through [place]. Routing to the refill pool to assist patient with request.  Hi Dr Sabra Heck,     We had discussed at my visit whether I needed Estrace or Estradiol generic.  I found out my insurance company has fixed it and now I need to have the prescription for the generic.  The regular is $150 versus $5 for generic.  Can you send a prescription for the generic Estadiol to my Pharmacy? [facility] is [facility] [place].    Thank you!    I hope you and your family have a wonderful holiday!    Cynthia [name]  [phone]

## 2018-06-05 NOTE — Telephone Encounter (Signed)
Prescription for generic estradiol cream 1 gram pv twice weekly sent to pharmacy on file.  Ok to close encounter.

## 2018-06-05 NOTE — Telephone Encounter (Signed)
Patient sent a mychart message asking for estradiol instead of Estrace due to insurance coverage be [facility] to [facility] [place]. Ok to change?

## 2018-06-17 ENCOUNTER — Other Ambulatory Visit: Payer: Self-pay | Admitting: Physician Assistant

## 2018-06-17 DIAGNOSIS — R454 Irritability and anger: Secondary | ICD-10-CM

## 2018-06-24 DIAGNOSIS — E669 Obesity, unspecified: Secondary | ICD-10-CM | POA: Diagnosis not present

## 2018-06-24 DIAGNOSIS — K219 Gastro-esophageal reflux disease without esophagitis: Secondary | ICD-10-CM | POA: Diagnosis not present

## 2018-06-24 DIAGNOSIS — K5904 Chronic idiopathic constipation: Secondary | ICD-10-CM | POA: Diagnosis not present

## 2018-06-24 DIAGNOSIS — Z800 Family history of malignant neoplasm of digestive organs: Secondary | ICD-10-CM | POA: Diagnosis not present

## 2018-06-25 ENCOUNTER — Telehealth: Payer: Self-pay

## 2018-06-25 ENCOUNTER — Encounter: Payer: Self-pay | Admitting: Physician Assistant

## 2018-06-25 ENCOUNTER — Ambulatory Visit (INDEPENDENT_AMBULATORY_CARE_PROVIDER_SITE_OTHER): Payer: BLUE CROSS/BLUE SHIELD | Admitting: Physician Assistant

## 2018-06-25 VITALS — BP 137/68 | HR 80 | Ht 67.0 in | Wt 205.5 lb

## 2018-06-25 DIAGNOSIS — R454 Irritability and anger: Secondary | ICD-10-CM

## 2018-06-25 DIAGNOSIS — G8929 Other chronic pain: Secondary | ICD-10-CM | POA: Diagnosis not present

## 2018-06-25 DIAGNOSIS — M545 Low back pain, unspecified: Secondary | ICD-10-CM

## 2018-06-25 DIAGNOSIS — F900 Attention-deficit hyperactivity disorder, predominantly inattentive type: Secondary | ICD-10-CM

## 2018-06-25 MED ORDER — CELECOXIB 200 MG PO CAPS: 200.0000 mg | ORAL_CAPSULE | Freq: Two times a day (BID) | ORAL | 3 refills | Status: DC

## 2018-06-25 MED ORDER — LISDEXAMFETAMINE DIMESYLATE 60 MG PO CAPS: 60.0000 mg | ORAL_CAPSULE | ORAL | 0 refills | Status: DC

## 2018-06-25 MED ORDER — BUPROPION HCL ER (XL) 300 MG PO TB24: 300.0000 mg | ORAL_TABLET | Freq: Every day | ORAL | 1 refills | Status: DC

## 2018-06-25 NOTE — Telephone Encounter (Signed)
[facility] to advise that insurance would not cover Celebrex. Pt was still in office and was given a printed RX and [place] coupon card, per her request.

## 2018-06-25 NOTE — Progress Notes (Signed)
Subjective:    Patient ID: Cynthia [name], female    DOB: 11-Sep-1960, 58 y.o.   MRN: [id number]  HPI  Pt is a 58 yo obese female with ADD, HTN, hx of acostic neuroma who presents to the clinic for follow up and medication refill.   ADD- no problems or concerns. Doing well on current dose. No increase in anxiety or insomnia. Work production doing great.   HTN- doing well. No CP, palpitations, headaches or vision changes.   Mood is doing well. No SI/HC.   Pt would like to lose weight. She is on wellbutrin. She tried contrave in the past minimal results. Tried belviq and does not remember any benefit. She has not been able to tolerate topamax. She is concerned about thyroid warning on saxenda. She is exercising 2-3 times a week.   .. Active Ambulatory Problems    Diagnosis Date Noted  . Iron deficiency anemia, unspecified 02/18/2013  . Swelling of limb 09/13/2013  . Interdigital neuralgia 04/14/2013  . 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 ADD (attention deficit disorder) 05/11/2014  . Chronic constipation 05/11/2014  . Essential (primary) hypertension 05/11/2014  . Gastro-esophageal reflux disease without esophagitis 05/11/2014  . Asthma, mild persistent 05/11/2014  . 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 Hypokalemia 02/13/2015  . 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 Hashimoto's thyroiditis 06/14/2015  . Subconjunctival hemorrhage of right eye 06/28/2015  . 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 Costochondritis 03/21/2016  . Chronic left-sided low back pain 09/19/2016  . Fatty liver disease, nonalcoholic 17/00/1749  . Left renal mass 03/26/2017  . Obesity (BMI 30.0-34.9) 03/26/2017  . 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 Motion sickness 12/26/2017  . Exposure to influenza 05/07/2018   Resolved Ambulatory Problems    Diagnosis Date Noted  . IFG (impaired fasting glucose) 09/13/2013  . Backache 09/13/2013  . Wheezing  09/13/2013  . Obesity, unspecified 09/13/2013  . Low back pain 05/11/2014  . Thyromegaly 02/13/2015  . Acoustic neuroma ([place]) 12/10/2014  . Left ear hearing loss 10/14/2015  . Irritable 10/17/2016  . Thyroid adenoma 05/03/2015   Past Medical History:  Diagnosis Date  . Asthma   . Awareness under anesthesia   . Balance problem   . Benign brain tumor ([place])   . Chronic back pain   . Constipation, chronic   . Diabetes mellitus   . Difficult airway   . 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  . GERD (gastroesophageal reflux disease)   . Obesity   . Tendonitis, Achilles, left       Review of Systems See HPI.     Objective:   Physical Exam Vitals signs reviewed.  Constitutional:      Appearance: Normal appearance.  HENT:     Head: Normocephalic and atraumatic.  Cardiovascular:     Rate and Rhythm: Normal rate and regular rhythm.  Neurological:     General: No focal deficit present.     Mental Status: She is alert and oriented to person, place, and time.  Psychiatric:        Mood and Affect: Mood normal.        Behavior: Behavior normal.           Assessment & Plan:  [name] KitchenMarland KitchenKasiya was seen today for medication refill.  Diagnoses and all orders for this visit:  Attention deficit hyperactivity disorder (ADHD), predominantly inattentive type -     lisdexamfetamine (VYVANSE) 60 MG capsule; Take 1 capsule (60 mg  total) by mouth every morning. -     lisdexamfetamine (VYVANSE) 60 MG capsule; Take 1 capsule (60 mg total) by mouth every morning. -     lisdexamfetamine (VYVANSE) 60 MG capsule; Take 1 capsule (60 mg total) by mouth every morning.  Irritable -     buPROPion (WELLBUTRIN XL) 300 MG 24 hr tablet; Take 1 tablet (300 mg total) by mouth daily.  Chronic left-sided low back pain without sciatica -     celecoxib (CELEBREX) 200 MG capsule; Take 1 capsule (200 mg total) by mouth 2 (two) times daily.  Other orders -     Discontinue: celecoxib (CELEBREX) 200 MG capsule; Take 1 capsule (200 mg  total) by mouth 2 (two) times daily.   .. Depression screen [facility] 2/9 06/25/2018 12/24/2017 03/26/2017  Decreased Interest 0 0 0  Down, Depressed, Hopeless 0 0 0  PHQ - 2 Score 0 0 0  Altered sleeping 1 0 -  Tired, decreased energy 1 1 -  Change in appetite 1 0 -  Feeling bad or failure about yourself  0 0 -  Trouble concentrating 0 0 -  Moving slowly or fidgety/restless 0 0 -  Suicidal thoughts 0 0 -  PHQ-9 Score 3 1 -  Difficult doing work/chores Not difficult at all Not difficult at all -   .. GAD 7 : Generalized Anxiety Score 06/25/2018 12/24/2017  Nervous, Anxious, on Edge 0 0  Control/stop worrying 0 0  Worry too much - different things 0 0  Trouble relaxing 1 0  Restless 1 0  Easily annoyed or irritable 0 0  Afraid - awful might happen 0 0  Total GAD 7 Score 2 0  Anxiety Difficulty Not difficult at all Not difficult at all    Refilled medications. Follow up in 3 months.   Discussed weight. Continue to exercise.  Tried contrave, qsymia, belviq.  Concerned about all her thyroid issues and the saxenda warning with MENS tumor.  [name] Kitchen.Discussed low carb diet with 1500 calories and 80g of protein.  Exercising at least 150 minutes a week.  My [employer]itness Pal could be a Microbiologist.

## 2018-07-15 ENCOUNTER — Encounter: Payer: Self-pay | Admitting: Physician Assistant

## 2018-07-15 ENCOUNTER — Ambulatory Visit (INDEPENDENT_AMBULATORY_CARE_PROVIDER_SITE_OTHER): Payer: BLUE CROSS/BLUE SHIELD | Admitting: Physician Assistant

## 2018-07-15 VITALS — BP 134/90 | HR 89 | Temp 97.7°F | Ht 67.0 in | Wt 204.0 lb

## 2018-07-15 DIAGNOSIS — J209 Acute bronchitis, unspecified: Secondary | ICD-10-CM

## 2018-07-15 MED ORDER — BENZONATATE 200 MG PO CAPS: 200.0000 mg | ORAL_CAPSULE | Freq: Two times a day (BID) | ORAL | 0 refills | Status: DC | PRN

## 2018-07-15 MED ORDER — PREDNISONE 50 MG PO TABS: ORAL_TABLET | ORAL | 0 refills | Status: DC

## 2018-07-15 MED ORDER — HYDROCOD POLST-CPM POLST ER 10-8 MG/5ML PO SUER: 5.0000 mL | Freq: Two times a day (BID) | ORAL | 0 refills | Status: DC | PRN

## 2018-07-15 NOTE — Patient Instructions (Signed)
Acute Bronchitis, Adult Acute bronchitis is when air tubes (bronchi) in the lungs suddenly get swollen. The condition can make it hard to breathe. It can also cause these symptoms:  A cough.  Coughing up clear, yellow, or green mucus.  Wheezing.  Chest congestion.  Shortness of breath.  A fever.  Body aches.  Chills.  A sore throat. Follow these instructions at home:  Medicines  Take over-the-counter and prescription medicines only as told by your doctor.  If you were prescribed an antibiotic medicine, take it as told by your doctor. Do not stop taking the antibiotic even if you start to feel better. General instructions  Rest.  Drink enough fluids to keep your pee (urine) pale yellow.  Avoid smoking and secondhand smoke. If you smoke and you need help quitting, ask your doctor. Quitting will help your lungs heal faster.  Use an inhaler, cool mist vaporizer, or humidifier as told by your doctor.  Keep all follow-up visits as told by your doctor. This is important. How is this prevented? To lower your risk of getting this condition again:  Wash your hands often with soap and water. If you cannot use soap and water, use hand sanitizer.  Avoid contact with people who have cold symptoms.  Try not to touch your hands to your mouth, nose, or eyes.  Make sure to get the flu shot every year. Contact a doctor if:  Your symptoms do not get better in 2 weeks. Get help right away if:  You cough up blood.  You have chest pain.  You have very bad shortness of breath.  You become dehydrated.  You faint (pass out) or keep feeling like you are going to pass out.  You keep throwing up (vomiting).  You have a very bad headache.  Your fever or chills gets worse. This information is not intended to replace advice given to you by your health care provider. Make sure you discuss any questions you have with your health care provider. Document Released: 11/21/2007 Document  Revised: 01/16/2017 Document Reviewed: 11/23/2015 Elsevier Interactive Patient Education  2019 Elsevier Inc.  

## 2018-07-16 NOTE — Progress Notes (Signed)
Subjective:    Patient ID: Cynthia [name], female    DOB: Apr 14, 1961, 58 y.o.   MRN: [id number]  HPI Pt is a 58 yo female with hx of asthma who presents to the clinic for cough for about 4 days. She was sent home from work today due to coughing. She does not really feel that bad. She does get into these coughing fits that can seem intense.She denies any sinus pressure, ear pain, SOB, wheezing, fever, chills, or body aches. She is sucking on cough drops with some relief. She has not started any new medications. She has used albuterol inhaler a few times and helped some.   .. Active Ambulatory Problems    Diagnosis Date Noted  . Iron deficiency anemia, unspecified 02/18/2013  . Swelling of limb 09/13/2013  . Interdigital neuralgia 04/14/2013  . 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 ADD (attention deficit disorder) 05/11/2014  . Chronic constipation 05/11/2014  . Essential (primary) hypertension 05/11/2014  . Gastro-esophageal reflux disease without esophagitis 05/11/2014  . Asthma, mild persistent 05/11/2014  . 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 Hypokalemia 02/13/2015  . 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 Hashimoto's thyroiditis 06/14/2015  . Subconjunctival hemorrhage of right eye 06/28/2015  . 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 Costochondritis 03/21/2016  . Chronic left-sided low back pain 09/19/2016  . Fatty liver disease, nonalcoholic 59/74/1638  . Left renal mass 03/26/2017  . Obesity (BMI 30.0-34.9) 03/26/2017  . 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 Motion sickness 12/26/2017  . Exposure to influenza 05/07/2018   Resolved Ambulatory Problems    Diagnosis Date Noted  . IFG (impaired fasting glucose) 09/13/2013  . Backache 09/13/2013  . Wheezing 09/13/2013  . Obesity, unspecified 09/13/2013  . Low back pain 05/11/2014  . Thyromegaly 02/13/2015  . Acoustic neuroma ([place]) 12/10/2014  . Left ear hearing loss  10/14/2015  . Irritable 10/17/2016  . Thyroid adenoma 05/03/2015   Past Medical History:  Diagnosis Date  . Asthma   . Awareness under anesthesia   . Balance problem   . 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 Chronic back pain   . Constipation, chronic   . Diabetes mellitus   . Difficult airway   . 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  . GERD (gastroesophageal reflux disease)   . Obesity   . Tendonitis, Achilles, left       Review of Systems See HPI>     Objective:   Physical Exam Vitals signs reviewed.  Constitutional:      Appearance: Normal appearance.  HENT:     Head: Normocephalic and atraumatic.     Right Ear: Tympanic membrane and ear canal normal.     Left Ear: Tympanic membrane and ear canal normal.     Nose: Nose normal.     Mouth/Throat:     Mouth: Mucous membranes are moist.     Pharynx: No oropharyngeal exudate or posterior oropharyngeal erythema.  Eyes:     Conjunctiva/sclera: Conjunctivae normal.  Neck:     Musculoskeletal: Normal range of motion.  Cardiovascular:     Rate and Rhythm: Normal rate and regular rhythm.     Pulses: Normal pulses.  Pulmonary:     Effort: Pulmonary effort is normal.     Breath sounds: Normal breath sounds. No wheezing or rhonchi.  Lymphadenopathy:     Cervical: No cervical adenopathy.  Neurological:     General: No focal deficit present.     Mental  Status: She is alert and oriented to person, place, and time.  Psychiatric:        Mood and Affect: Mood normal.        Behavior: Behavior normal.           Assessment & Plan:  [name] KitchenMarland KitchenBeatrix was seen today for cough.  Diagnoses and all orders for this visit:  Acute bronchitis, unspecified organism -     benzonatate (TESSALON) 200 MG capsule; Take 1 capsule (200 mg total) by mouth 2 (two) times daily as needed for cough. -     predniSONE (DELTASONE) 50 MG tablet; Take one tablet for 5 days. -     chlorpheniramine-HYDROcodone ([place]) 10-8 MG/5ML SUER; Take 5 mLs by mouth every 12 (twelve)  hours as needed for cough (cough, will cause drowsiness.).  Other orders -     Discontinue: chlorpheniramine-HYDROcodone ([place]) 10-8 MG/5ML SUER; Take 5 mLs by mouth every 12 (twelve) hours as needed for cough (cough, will cause drowsiness.).   Reassured patient no bacterial causes of cough seen. Likely viral. Keep hydrated. Start prednisone for 5 days. flonase for PND. Tessalon during the day. tussionex at night. Discussed sedation. Continue to use albuterol inhaler as needed. She is able to go back to work tomorrow. Follow up as needed or if symptoms worsen.

## 2018-07-25 ENCOUNTER — Ambulatory Visit (INDEPENDENT_AMBULATORY_CARE_PROVIDER_SITE_OTHER): Payer: BLUE CROSS/BLUE SHIELD | Admitting: Internal Medicine

## 2018-07-25 ENCOUNTER — Encounter: Payer: Self-pay | Admitting: Internal Medicine

## 2018-07-25 VITALS — BP 122/80 | HR 80 | Ht 67.0 in | Wt 204.0 lb

## 2018-07-25 DIAGNOSIS — E063 Autoimmune thyroiditis: Secondary | ICD-10-CM

## 2018-07-25 DIAGNOSIS — D497 Neoplasm of unspecified behavior of endocrine glands and other parts of nervous system: Secondary | ICD-10-CM | POA: Diagnosis not present

## 2018-07-25 LAB — TSH: TSH: 2.05 mIU/L

## 2018-07-25 LAB — T4, FREE: Free T4: 1.4 ng/dL (ref 0.8–1.8)

## 2018-07-25 NOTE — Progress Notes (Signed)
Patient ID: Cynthia [name], female   DOB: [birth year], 58 y.o.   MRN: [id number]   HPI  Cynthia [name] is a 58 y.o.-year-old female, returning for f/u for thyroid cancer and postsurgical hypothyroidism. Last visit 1 year ago  Reviewed her thyroid neoplasm surgery: She noticed a lump in her throat in 2015 and had hoarseness. Saw PCP >> thyroid U/S >> thyroid nodule >> FNA: Suspicious for follicular neoplasm >> referred to ENT >> R hemithyroidectomy (Dr Simonne Maffucci). 02/22/2015:  Thyroid ultrasound:  Right thyroid lobe : 6.4 x 2.5 x 2.3 cm. Heterogeneous appearance. Solid nodule measuring 2.4 x 1.8 x 1.9 centimeters, without internal calcification , with hypoechoic margin and no significant internal blood flow.  Left thyroid lobe, 5.6 x 1.7 x 1.5 cm. Heterogeneous appearance. No focal nodules.  Isthmus : 4 mm in thickness, no nodules visualized.  No lymphadenopathy visualized. 03/29/2015: Right thyroid nodule FNA: Suspicious for a follicular neoplasm (Bethesda category IV) 04/28/2015: R Hemithyroidectomy by Dr Simonne Maffucci ([place]) Path: A. 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 Resection margins are negative. Note: Dr. Vernell Barrier has also reviewed this case and concurs. JAMA Oncol. 2016; 2(8):1023-29. A. "Right thyroid node, stitch at superior pole", in formalin. Received is a 14g, 4.5 x 3.0 x 2.5 cm lobe of thyroid. Inked as follows: Anterior - blue, posterior - black, ischemic margin - yellow. Sectioning reveals a 2.4 x 1.5 x 2.4 cm well-circumscribed nodule with a homogenous firm tan surface. The nodule is in the inferior portion of the lobe and markedly distorts the anterior and posterior capsule. In the superior portion of the lobe there are multiple white areas, which range from 0.2 to 0.7 cm in greatest dimension. Additionally received is detached brown fragment, 1.2 x 1.0 x 0.3 cm.  Block summary: A1-A2 - Full  thickness section of nodule, bisected A3-A4 - Full thickness section of nodule, bisected and including ischemic margin A5-A7 - Representative section of superior portion of thyroid A8 - Detached fragment completely submitted A9-A15 - Remainder of tumor capsule, entirely from superior to inferior  08/06/2016: Neck U/S:  1. No residual/recurrent tissue post right thyroid lobectomy. 2. Solitary small left 0.4 cm nodule does not meet current criteria for biopsy or dedicated imaging follow-up.  She has a combination of postsurgical and Hashimoto's hypothyroidism  Pt is on levothyroxine 50 mcg daily, taken: - in am - fasting - at least 2h from b'fast - no Ca, Fe, MVI - + Dexilant at night - not on Biotin  Reviewed TFTs - normal: Lab Results  Component Value Date   TSH 2.54 07/23/2017   TSH 3.15 08/29/2016   TSH 1.72 10/07/2015   TSH 4.44 07/21/2015   TSH 4.05 06/08/2015   TSH 2.669 02/22/2015   TSH 2.489 10/15/2013   FREET4 1.03 07/23/2017   FREET4 1.16 08/29/2016   FREET4 1.10 10/07/2015   FREET4 1.11 07/21/2015   FREET4 0.87 06/08/2015   FREET4 1.13 02/22/2015  07/17/2016: TSH 5.160 (0.45-4.5)   She had high TPO Ab's: Component     Latest Ref Rng & Units 06/08/2015  Thyroperoxidase Ab SerPl-aCnc     <9 IU/mL 80 (H)  Thyroglobulin Ab     <2 IU/mL 1   Pt denies: - feeling nodules in neck - dysphagia - choking - SOB with lying down But does have hoarseness  She has + FH of thyroid disorders in: mother. No FH of thyroid cancer. No h/o radiation tx to head or  neck.  NNo herbal supplements. No Biotin use. No recent steroids use.   + h/o hyperglycemia with normal HbA1c: Lab Results  Component Value Date   HGBA1C 5.2 04/16/2016   HGBA1C 5.4 06/08/2015   HGBA1C 5.1 10/15/2013   She was on Metformin >> lost 80-90 lbs (heaviest: ~290 lbs) >> sugars improved >> taken off the med >> started to gain wt again.   She also has a history of acoustic neuroma surgery.She has  more HA lately.   ROS: Constitutional: no weight gain/+ weight loss (10 lbs in last year), + fatigue, no subjective hyperthermia, no subjective hypothermia, + Nocturia Eyes: no blurry vision, no xerophthalmia ENT: no sore throat, + see HPI Cardiovascular: no CP/no SOB/no palpitations/no leg swelling Respiratory: no cough/no SOB/no wheezing Gastrointestinal: no N/no V/no D/+ C/no acid reflux Musculoskeletal: no muscle aches/no joint aches Skin: no rashes, no hair loss Neurological: + tremors/no numbness/no tingling/no dizziness, + HA - more frequently  I reviewed pt's medications, allergies, PMH, social hx, family hx, and changes were documented in the history of present illness. Otherwise, unchanged from my initial visit note.  Past Medical History:  Diagnosis Date  . Acoustic neuroma ([place])   . ADD (attention deficit disorder)    and OCD-on Vyvanse  . Asthma    w/out status asthmaticus  . Awareness under anesthesia   . Balance problem    after acoustic neuroma excision  . Benign brain tumor ([place])   . Chronic back pain   . Constipation, chronic    IBS, CIC  . Diabetes mellitus   . Difficult airway   . 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 GERD (gastroesophageal reflux disease)   . Iron deficiency anemia, unspecified 02/18/2013  . Obesity    296 lbs (highest weight in 2012)   . Tendonitis, Achilles, left    Past Surgical History:  Procedure Laterality Date  . ABLATION     and bladder sling  . ACHILLES TENDON REPAIR     x2  . ANAL FISSURE REPAIR  2012  . BACK SURGERY    . CHOLECYSTECTOMY    . CRANIOTOMY  09/13/2015   with excision of acoustic neuroma  . IMPLANTATION BONE ANCHORED HEARING AID    . MOUTH SURGERY  2015   3 implants  . OVARIAN CYST REMOVAL     x4  . THYROID LOBECTOMY Right 04/28/2015   Partial   . TOE FUSION Left    Social History   Social History Main Topics  . Smoking status: Never Smoker   . Smokeless tobacco: Never Used  .  Alcohol Use: 0.5 - 1.0 oz/week    1-2 Standard drinks or equivalent per week  . Drug Use: No  . Sexual Activity:    Partners: Male    Birth Control/ Protection: Other-see comments     Comment: vasectomy   Social History Narrative   Marital Status:  Married Information systems manager)    Children:  None    Pets: Dog (01)    Living Situation: Lives with husband     Occupation:  IT - [employer]    Education:  Master's Degree    Tobacco Use/Exposure:  None    Alcohol Use:  Occasional   Drug Use:  None   Diet:  Regular   Exercise:  Kick-boxing and boot camp - 5 times per week    Hobbies: Golf, tennis and working out   Current Outpatient Medications on [employer] Prior  to Visit  Medication Sig Dispense Refill  . albuterol (PROVENTIL HFA;VENTOLIN HFA) 108 (90 Base) MCG/ACT inhaler Inhale 2 puffs into the lungs every 6 (six) hours as needed for wheezing or shortness of breath. 1 Inhaler 2  . benzonatate (TESSALON) 200 MG capsule Take 1 capsule (200 mg total) by mouth 2 (two) times daily as needed for cough. 20 capsule 0  . budesonide-formoterol (SYMBICORT) 160-4.5 MCG/ACT inhaler INHALE 2 PUFFS INTO THE LUNGS 2 TIMES DAILY 10.2 Inhaler 5  . buPROPion (WELLBUTRIN XL) 300 MG 24 hr tablet Take 1 tablet (300 mg total) by mouth daily. 90 tablet 1  . celecoxib (CELEBREX) 200 MG capsule Take 1 capsule (200 mg total) by mouth 2 (two) times daily. 180 capsule 3  . chlorpheniramine-HYDROcodone (TUSSIONEX) 10-8 MG/5ML SUER Take 5 mLs by mouth every 12 (twelve) hours as needed for cough (cough, will cause drowsiness.). 60 mL 0  . cyclobenzaprine (FLEXERIL) 10 MG tablet TAKE 1 TABLET AT BEDTIME 90 tablet 4  . dexlansoprazole (DEXILANT) 60 MG capsule Take 60 mg by mouth daily.    [name] Kitchen docusate sodium (COLACE) 50 MG capsule Take 100 mg by mouth at bedtime.    [name] Kitchen estradiol (ESTRACE) 0.1 MG/GM vaginal cream 1 gram vaginally twice weekly 42.5 g 6  . levothyroxine (SYNTHROID, LEVOTHROID) 50 MCG tablet TAKE 1 TABLET BEFORE  BREAKFAST ON EMPTY STOMACH 90 tablet 3  . LINZESS 290 MCG CAPS capsule Take 1 capsule by mouth daily.    Derrill Memo ON 08/24/2018] lisdexamfetamine (VYVANSE) 60 MG capsule Take 1 capsule (60 mg total) by mouth every morning. 30 capsule 0  . [START ON 07/26/2018] lisdexamfetamine (VYVANSE) 60 MG capsule Take 1 capsule (60 mg total) by mouth every morning. 30 capsule 0  . lisdexamfetamine (VYVANSE) 60 MG capsule Take 1 capsule (60 mg total) by mouth every morning. 30 capsule 0  . predniSONE (DELTASONE) 50 MG tablet Take one tablet for 5 days. 5 tablet 0  . triamterene-hydrochlorothiazide (MAXZIDE-25) 37.5-25 MG tablet TAKE 1 TABLET DAILY 90 tablet 4  . valACYclovir (VALTREX) 1000 MG tablet 1 tab BID x 3 days and then daily. 36 tablet 1   No current facility-administered medications on file prior to visit.    Allergies  Allergen Reactions  . Mobic [Meloxicam] Nausea And Vomiting  . Multivitamins Other (See Comments)   Family History  Problem Relation Age of Onset  . Thyroid disease Mother   . Heart Problems Mother        vavle issue  . Dystonia Mother   . Hypercholesterolemia Mother   . Heart attack Father   . Cancer Father        esophageal  . Heart disease Father   . Lung cancer Paternal Grandfather   . Heart disease Paternal Grandfather   . Heart disease Maternal Grandfather   . Heart disease Paternal Grandmother   . Heart disease Other        paternal and maternal side  . Cancer Brother        esophageal/stomach cancer   PE: BP 122/80   Pulse 80   Ht 5\' 7"  (1.702 m)   Wt 204 lb (92.5 kg)   SpO2 99%   BMI 31.95 kg/m  Wt Readings from Last 3 Encounters:  07/25/18 204 lb (92.5 kg)  07/15/18 204 lb (92.5 kg)  06/25/18 205 lb 8 oz (93.2 kg)   Constitutional: overweight, in NAD Eyes: PERRLA, EOMI, no exophthalmos ENT: moist mucous membranes, no thyromegaly, no cervical lymphadenopathy Cardiovascular:  RRR, No MRG Respiratory: CTA B Gastrointestinal: abdomen soft, NT, ND,  BS+ Musculoskeletal: no deformities, strength intact in all 4 Skin: moist, warm, no rashes Neurological: + tremor with outstretched hands R>L, DTR normal in all 4  ASSESSMENT: 1.  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 Postsurgical + Hashimoto hypothyroidism  PLAN:  1.  Follicular thyroid neoplasm -Status post right hemithyroidectomy -She has NIFTP- lowest risk across the thyroid neoplasm classification.  Based on recent guidelines, this is not even considered a cancer, but a neoplasm.  Treatments for these tumors can be de-escalated from total to just partial thyroidectomy without RAI treatment.  The chance of recurrence is less than 1% in 15 years. -We will continue to follow her by TFTs and neck ultrasounds if needed -Reviewed latest ultrasound from [facility] from 2019-no masses.  She has a small left 0.4 cm nodule, not worrisome. - Plan to repeat ultrasound if she feels any neck compression symptoms.  So far, she denies any of th above symptoms except for occasional hoarseness.  2.  Postsurgical + Hashimoto's hypothyroidism - mild - TSH was 5.16 (slightly high) >> we separated Dexilant from LT4 >> TFTs normalized - latest thyroid labs reviewed with pt >> normal at last OV - she continues on LT4 50 Mcg daily - pt feels good on this dose. - we discussed about taking the thyroid hormone every day, with water, >30 minutes before breakfast, separated by >4 hours from acid reflux medications, calcium, iron, multivitamins. Pt. is taking it correctly. - will check thyroid tests today: TSH and fT4 - If labs are abnormal, she will need to return for repeat TFTs in 1.5 months - OTW, I will see her back in 1 year  Needs refills.  Component     Latest Ref Rng & Units 07/25/2018  TSH     mIU/L 2.05  T4,Free(Direct)     0.8 - 1.8 ng/dL 1.4  Labs are normal >> continue current dose of  levothyroxine.  Philemon Kingdom, MD PhD [facility] Endocrinology

## 2018-07-25 NOTE — Patient Instructions (Signed)
Please continue Levothyroxine 50 mcg daily.  Take the thyroid hormone every day, with water, at least 30 minutes before breakfast, separated by at least 4 hours from: - acid reflux medications - calcium - iron - multivitamins  Please stop at the lab.  Please return in 1 year.   

## 2018-07-29 ENCOUNTER — Other Ambulatory Visit: Payer: Self-pay | Admitting: Internal Medicine

## 2018-07-29 ENCOUNTER — Encounter: Payer: Self-pay | Admitting: Internal Medicine

## 2018-07-30 MED ORDER — LEVOTHYROXINE SODIUM 50 MCG PO TABS: ORAL_TABLET | ORAL | 4 refills | Status: DC

## 2018-08-16 DIAGNOSIS — D333 Benign neoplasm of cranial nerves: Secondary | ICD-10-CM | POA: Diagnosis not present

## 2018-08-18 DIAGNOSIS — H90A22 Sensorineural hearing loss, unilateral, left ear, with restricted hearing on the contralateral side: Secondary | ICD-10-CM | POA: Diagnosis not present

## 2018-08-18 DIAGNOSIS — D333 Benign neoplasm of cranial nerves: Secondary | ICD-10-CM | POA: Diagnosis not present

## 2018-08-18 DIAGNOSIS — R29818 Other symptoms and signs involving the nervous system: Secondary | ICD-10-CM | POA: Diagnosis not present

## 2018-08-18 DIAGNOSIS — H9192 Unspecified hearing loss, left ear: Secondary | ICD-10-CM | POA: Diagnosis not present

## 2018-09-06 ENCOUNTER — Other Ambulatory Visit: Payer: Self-pay | Admitting: Physician Assistant

## 2018-09-09 ENCOUNTER — Telehealth: Payer: Self-pay | Admitting: Physician Assistant

## 2018-09-09 ENCOUNTER — Other Ambulatory Visit: Payer: Self-pay

## 2018-09-09 ENCOUNTER — Telehealth (INDEPENDENT_AMBULATORY_CARE_PROVIDER_SITE_OTHER): Payer: BLUE CROSS/BLUE SHIELD | Admitting: Physician Assistant

## 2018-09-09 DIAGNOSIS — F900 Attention-deficit hyperactivity disorder, predominantly inattentive type: Secondary | ICD-10-CM | POA: Diagnosis not present

## 2018-09-09 DIAGNOSIS — J4530 Mild persistent asthma, uncomplicated: Secondary | ICD-10-CM

## 2018-09-09 MED ORDER — LISDEXAMFETAMINE DIMESYLATE 60 MG PO CAPS: 60.0000 mg | ORAL_CAPSULE | ORAL | 0 refills | Status: DC

## 2018-09-09 MED ORDER — ALBUTEROL SULFATE HFA 108 (90 BASE) MCG/ACT IN AERS: 2.0000 | INHALATION_SPRAY | Freq: Four times a day (QID) | RESPIRATORY_TRACT | 2 refills | Status: DC | PRN

## 2018-09-09 NOTE — Progress Notes (Signed)
.Virtual Visit via Telephone Note  I connected with Cynthia [name] on 09/09/18 at  3:00 PM EDT by telephone and verified that I am speaking with the correct person using two identifiers.   I discussed the limitations, risks, security and privacy concerns of performing an evaluation and management service by telephone and the availability of in person appointments. I also discussed with the patient that there may be a patient responsible charge related to this service. The patient expressed understanding and agreed to proceed.   History of Present Illness: Pt is a 58 yo female with asthma, hx of thyroid cancer, ADD, who calls in for refills.   ADD- pt is doing great. No problems or concerns. No anxiety, insomnia. Pt is doing well at work.   Pt is concerned about Covid. She has a hx of asthma and gets a cough that is unrelenting when she gets sick. She needs refill of albuterol inhaler. No fever, chills, SOB or body aches. She is working from home right now.   .. Active Ambulatory Problems    Diagnosis Date Noted  . Iron deficiency anemia, unspecified 02/18/2013  . Swelling of limb 09/13/2013  . Interdigital neuralgia 04/14/2013  . 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 ADD (attention deficit disorder) 05/11/2014  . Chronic constipation 05/11/2014  . Essential (primary) hypertension 05/11/2014  . Gastro-esophageal reflux disease without esophagitis 05/11/2014  . Asthma, mild persistent 05/11/2014  . 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[place]) 02/11/2015  . Hypokalemia 02/13/2015  . 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 Hashimoto's thyroiditis 06/14/2015  . Subconjunctival hemorrhage of right eye 06/28/2015  . 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 Costochondritis 03/21/2016  . Chronic left-sided low back pain 09/19/2016  . Fatty liver disease, nonalcoholic 93/81/8299  . Left renal mass 03/26/2017  . Obesity (BMI 30.0-34.9) 03/26/2017  . Compression of right radial  nerve 06/26/2017  . Motion sickness 12/26/2017  . Exposure to influenza 05/07/2018   Resolved Ambulatory Problems    Diagnosis Date Noted  . IFG (impaired fasting glucose) 09/13/2013  . Backache 09/13/2013  . Wheezing 09/13/2013  . Obesity, unspecified 09/13/2013  . Low back pain 05/11/2014  . Thyromegaly 02/13/2015  . Acoustic neuroma ([place]) 12/10/2014  . Left ear hearing loss 10/14/2015  . Irritable 10/17/2016  . Thyroid adenoma 05/03/2015   Past Medical History:  Diagnosis Date  . Asthma   . Awareness under anesthesia   . Balance problem   . Benign brain tumor ([place])   . Chronic back pain   . Constipation, chronic   . Diabetes mellitus   . Difficult airway   . 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  . GERD (gastroesophageal reflux disease)   . Obesity   . Tendonitis, Achilles, left    Reviewed medication and problem list.     Observations/Objective: No acute distress.   .. Today's Vitals   09/09/18 1459  BP: 123/87  Pulse: 74  Weight: 201 lb (91.2 kg)   Body mass index is 31.48 kg/m.   Assessment and Plan: [name] KitchenMarland KitchenDiagnoses and all orders for this visit:  Attention deficit hyperactivity disorder (ADHD), predominantly inattentive type -     lisdexamfetamine (VYVANSE) 60 MG capsule; Take 1 capsule (60 mg total) by mouth every morning. -     lisdexamfetamine (VYVANSE) 60 MG capsule; Take 1 capsule (60 mg total) by mouth every morning. -     lisdexamfetamine (VYVANSE) 60 MG capsule; Take 1 capsule (60 mg total) by mouth every morning.  Mild persistent  asthma without complication -     albuterol (PROVENTIL HFA;VENTOLIN HFA) 108 (90 Base) MCG/ACT inhaler; Inhale 2 puffs into the lungs every 6 (six) hours as needed for wheezing or shortness of breath.   PMP review with last rx on 08/27/18 filled. Not quite due went ahead and sent pt aware may not refill until 09/27/18. Follow up in 3 months.   Refilled albuterol inhaler. No active problems. Hx of asthma. Uses as needed. She is high risk  covid. She is working from home. Discussed what to do with symptoms and to use albuterol and tylenol. Follow up as needed or ED if in respiratory distress.   Follow up in 3 months.   Follow Up Instructions:    I discussed the assessment and treatment plan with the patient. The patient was provided an opportunity to ask questions and all were answered. The patient agreed with the plan and demonstrated an understanding of the instructions.   The patient was advised to call back or seek an in-person evaluation if the symptoms worsen or if the condition fails to improve as anticipated.  I provided 12 minutes of non-face-to-face time during this encounter.   Iran Planas, PA-C

## 2018-09-17 ENCOUNTER — Ambulatory Visit: Payer: BLUE CROSS/BLUE SHIELD | Admitting: Physician Assistant

## 2018-09-24 ENCOUNTER — Ambulatory Visit: Payer: BLUE CROSS/BLUE SHIELD | Admitting: Physician Assistant

## 2018-10-27 ENCOUNTER — Encounter: Payer: Self-pay | Admitting: Sports Medicine

## 2018-10-27 ENCOUNTER — Other Ambulatory Visit: Payer: Self-pay

## 2018-10-27 ENCOUNTER — Ambulatory Visit (INDEPENDENT_AMBULATORY_CARE_PROVIDER_SITE_OTHER): Payer: BLUE CROSS/BLUE SHIELD

## 2018-10-27 ENCOUNTER — Ambulatory Visit: Payer: BLUE CROSS/BLUE SHIELD | Admitting: Sports Medicine

## 2018-10-27 DIAGNOSIS — M546 Pain in thoracic spine: Secondary | ICD-10-CM | POA: Diagnosis not present

## 2018-10-27 DIAGNOSIS — M545 Low back pain: Secondary | ICD-10-CM | POA: Diagnosis not present

## 2018-10-27 DIAGNOSIS — W19XXXA Unspecified fall, initial encounter: Secondary | ICD-10-CM | POA: Diagnosis not present

## 2018-10-27 DIAGNOSIS — S299XXA Unspecified injury of thorax, initial encounter: Secondary | ICD-10-CM | POA: Diagnosis not present

## 2018-10-27 DIAGNOSIS — M549 Dorsalgia, unspecified: Secondary | ICD-10-CM

## 2018-10-27 IMAGING — DX THORACIC SPINE - 3 VIEWS
3 series · 3 of 3 positions shown · non-contrast
Comparison: Chest two-view [DATE]

CLINICAL DATA: Accidental fall 3 days ago.  Back pain

EXAM:
THORACIC SPINE - 3 VIEWS

[t-spine ap]
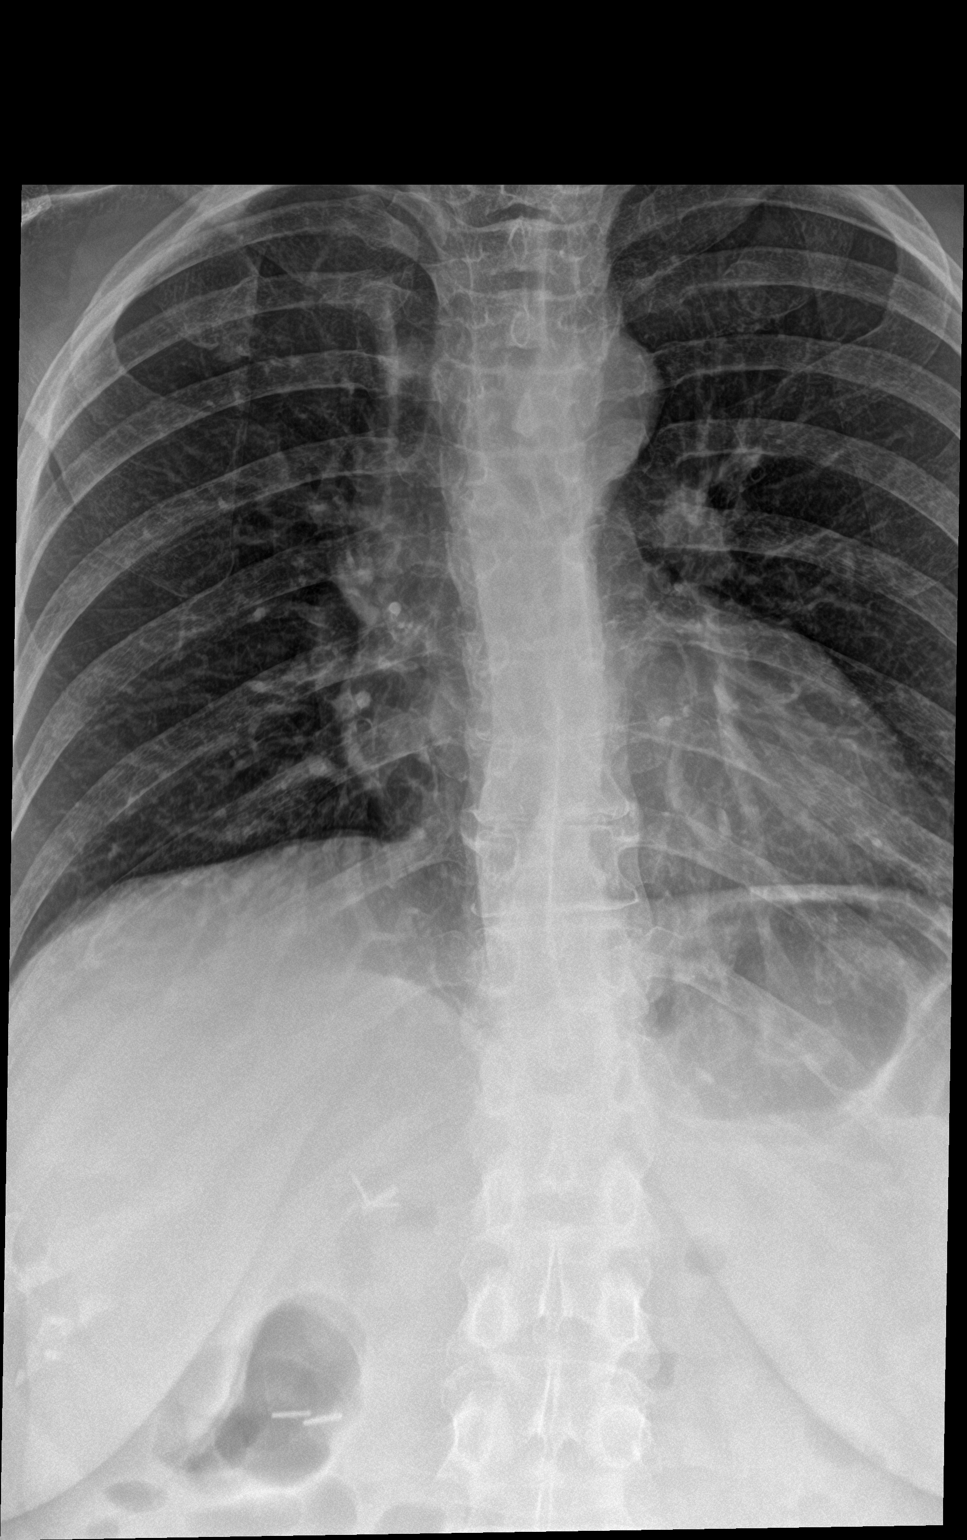

[t-spine lat]
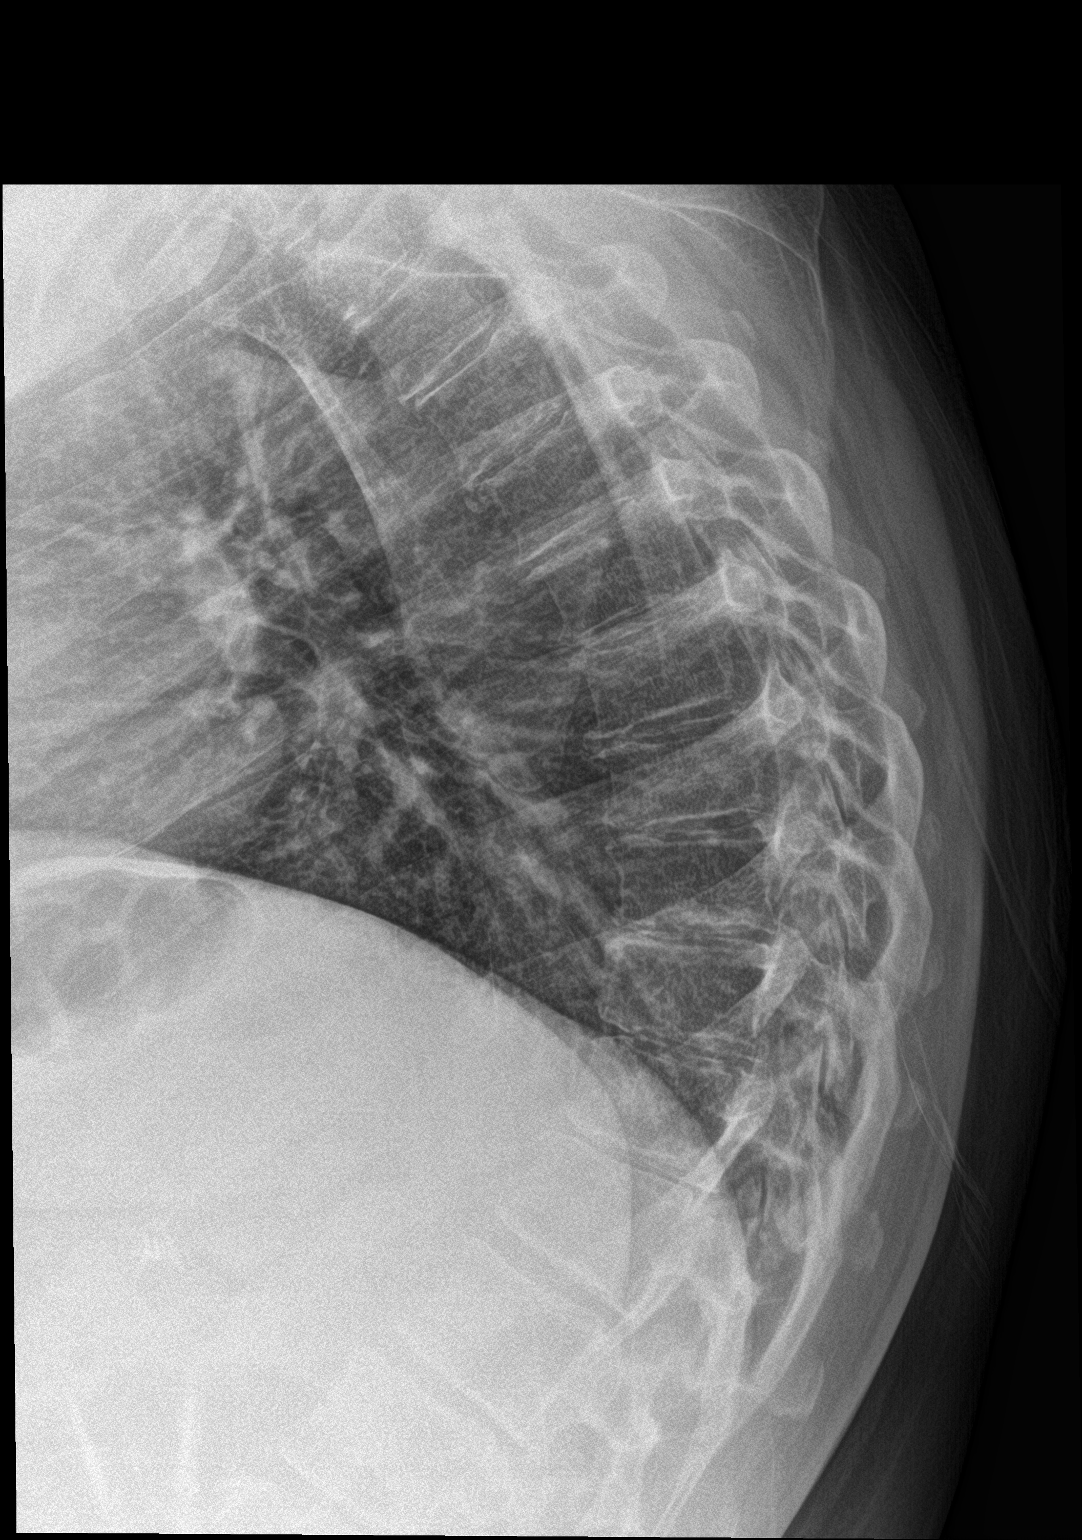

[t-spine swimmers]
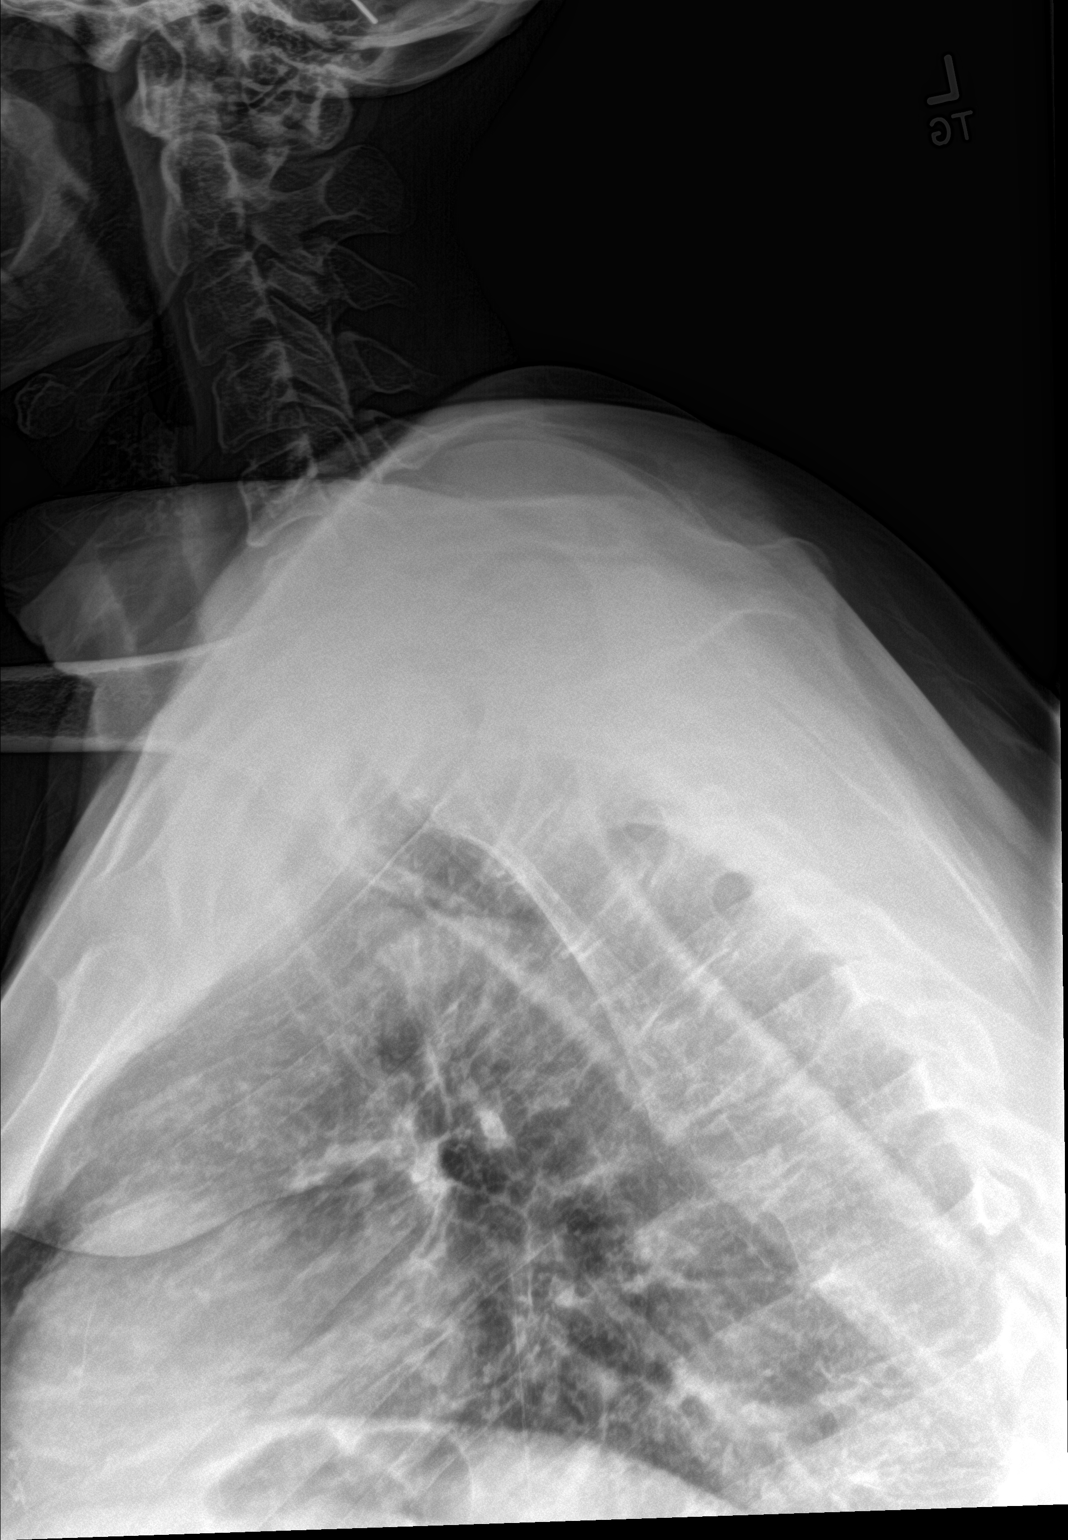

[3 of 3 positions shown; findings below may reference images not displayed]

FINDINGS: Negative for fracture. Mild multilevel degenerative change in the
disc spaces with mild spurring. Mild-to-moderate thoracic kyphosis
IMPRESSION: Negative for fracture

## 2018-10-27 IMAGING — DX LUMBAR SPINE - COMPLETE 4+ VIEW
5 series · 5 of 5 positions shown · non-contrast
Comparison: None.

CLINICAL DATA: Fall 3 days ago.  Back pain

EXAM:
LUMBAR SPINE - COMPLETE 4+ VIEW

[l-spine ap]
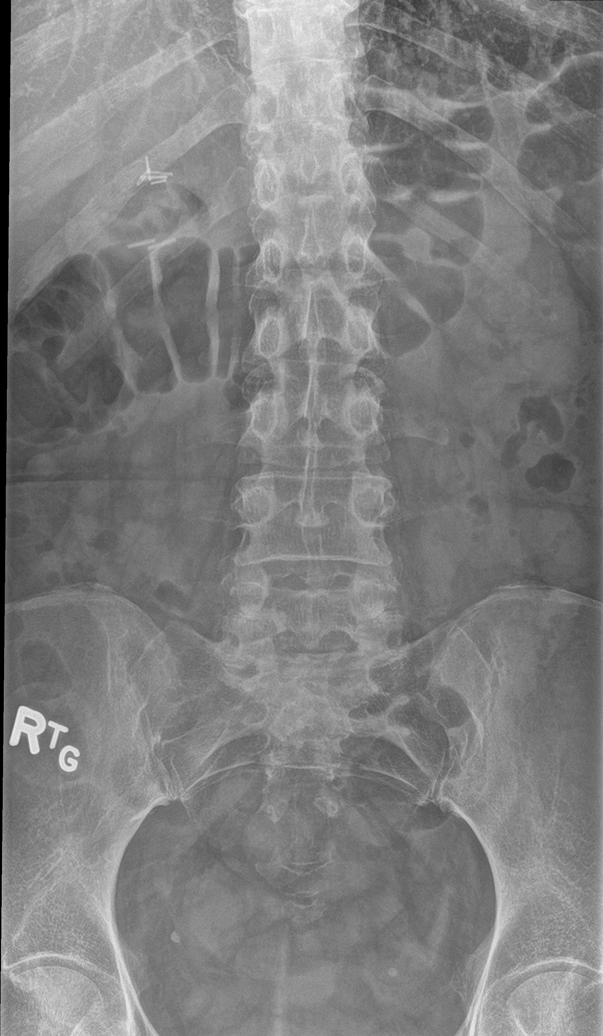

[l-spine obl (1 of 2)]
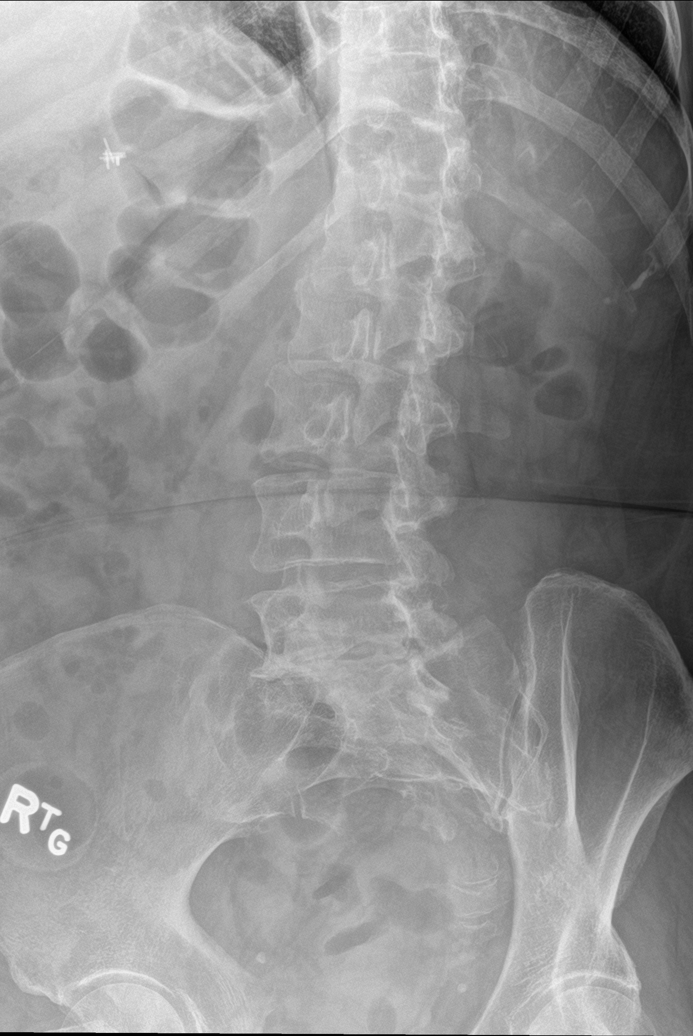

[l-spine obl (2 of 2)]
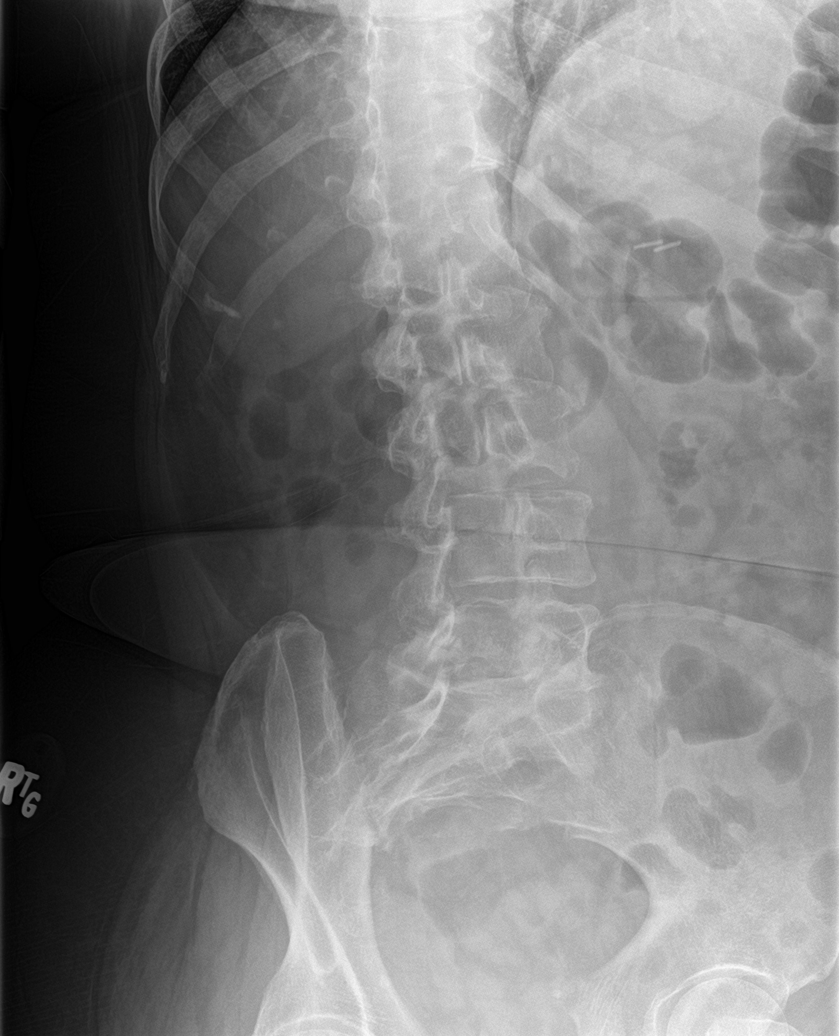

[l-spine lat]
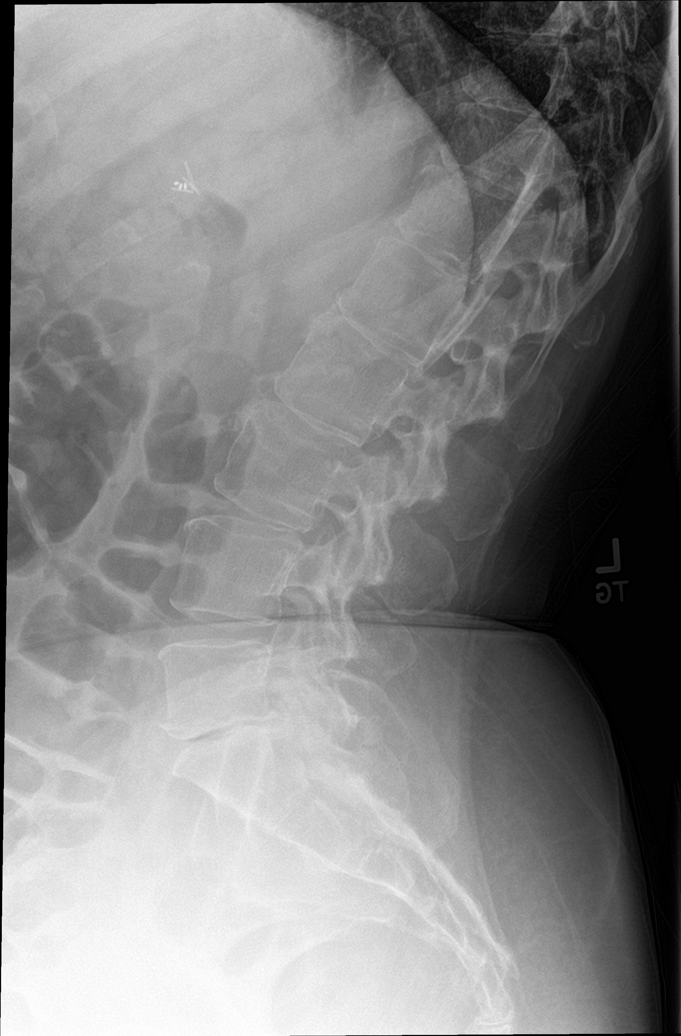

[l-spine spot]
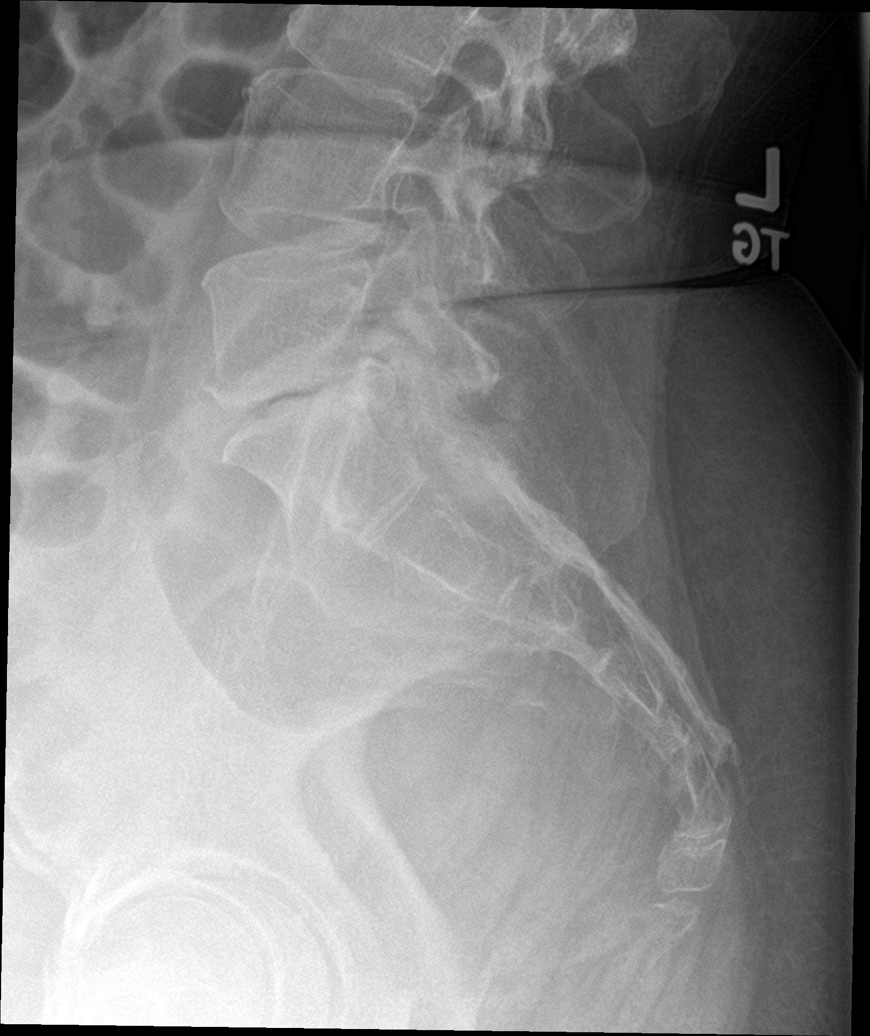

[5 of 5 positions shown; findings below may reference images not displayed]

FINDINGS: Normal lumbar alignment. Negative for fracture or mass. Moderate
disc degeneration and spurring L5-S1.
IMPRESSION: No acute abnormality.

## 2018-10-27 MED ORDER — HYDROCODONE-ACETAMINOPHEN 5-325 MG PO TABS: 1.0000 | ORAL_TABLET | Freq: Three times a day (TID) | ORAL | 0 refills | Status: DC | PRN

## 2018-10-27 NOTE — Assessment & Plan Note (Signed)
Pain over the thoracolumbar spine, spinous processes, as well as right posterior rib cage, x-rays of all of the above. Short course of hydrocodone, return to see me in a week.

## 2018-10-27 NOTE — Progress Notes (Signed)
Subjective:    CC: Fall  HPI: 1 week ago this pleasant 58 year old female slipped and fell, impacting her buttock, lower lumbar, lower thoracic vertebrae, as well as the right posterior chest wall.  She had the breath knocked out of her, no hemoptysis, no shortness of breath now, no paresthesias or focal neurologic symptoms, simply pain at the thoracolumbar junction and on the right side of the posterior back.  I reviewed the past medical history, family history, social history, surgical history, and allergies today and no changes were needed.  Please see the problem list section below in epic for further details.  Past Medical History: Past Medical History:  Diagnosis Date  . Acoustic neuroma ([place])   . ADD (attention deficit disorder)    and OCD-on Vyvanse  . Asthma    w/out status asthmaticus  . Awareness under anesthesia   . Balance problem    after acoustic neuroma excision  . Benign brain tumor ([place])   . Chronic back pain   . Constipation, chronic    IBS, CIC  . Diabetes mellitus   . Difficult airway   . 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 GERD (gastroesophageal reflux disease)   . Iron deficiency anemia, unspecified 02/18/2013  . Obesity    296 lbs (highest weight in 2012)   . Tendonitis, Achilles, left    Past Surgical History: Past Surgical History:  Procedure Laterality Date  . ABLATION     and bladder sling  . ACHILLES TENDON REPAIR     x2  . ANAL FISSURE REPAIR  2012  . BACK SURGERY    . CHOLECYSTECTOMY    . CRANIOTOMY  09/13/2015   with excision of acoustic neuroma  . IMPLANTATION BONE ANCHORED HEARING AID    . MOUTH SURGERY  2015   3 implants  . OVARIAN CYST REMOVAL     x4  . THYROID LOBECTOMY Right 04/28/2015   Partial   . TOE FUSION Left    Social History: Social History   Socioeconomic History  . Marital status: Married    Spouse name: Not on file  . Number of children: Not on file  . Years of education: Not on file  .  Highest education level: Not on file  Occupational History  . Not on file  Social Needs  . Financial resource strain: Not on file  . Food insecurity:    Worry: Not on file    Inability: Not on file  . Transportation needs:    Medical: Not on file    Non-medical: Not on file  Tobacco Use  . Smoking status: Never Smoker  . Smokeless tobacco: Never Used  Substance and Sexual Activity  . Alcohol use: Yes    Alcohol/week: 1.0 - 2.0 standard drinks    Types: 1 - 2 Standard drinks or equivalent per week  . Drug use: No  . Sexual activity: Yes    Partners: Male    Birth control/protection: Other-see comments    Comment: vasectomy  Lifestyle  . Physical activity:    Days per week: Not on file    Minutes per session: Not on file  . Stress: Not on file  Relationships  . Social connections:    Talks on phone: Not on file    Gets together: Not on file    Attends religious service: Not on file    Active member of club or organization: Not on file    Attends meetings  of clubs or organizations: Not on file    Relationship status: Not on file  Other Topics Concern  . Not on file  Social History Narrative   Marital Status:  Married Information systems manager)    Children:  None    Pets: Dog (01)    Living Situation: Lives with husband     Occupation:  IT - [employer]    Education:  Master's Degree    Tobacco Use/Exposure:  None    Alcohol Use:  Occasional   Drug Use:  None   Diet:  Regular   Exercise:  Kick-boxing and boot camp - 5 times per week    Hobbies: Golf, tennis and working out               Family History: Family History  Problem Relation Age of Onset  . Thyroid disease Mother   . Heart Problems Mother        vavle issue  . Dystonia Mother   . Hypercholesterolemia Mother   . Heart attack Father   . Cancer Father        esophageal  . Heart disease Father   . Lung cancer Paternal Grandfather   . Heart disease Paternal Grandfather   . Heart disease Maternal  Grandfather   . Heart disease Paternal Grandmother   . Heart disease Other        paternal and maternal side  . Cancer Brother        esophageal/stomach cancer   Allergies: Allergies  Allergen Reactions  . Mobic [Meloxicam] Nausea And Vomiting  . Multivitamins Other (See Comments)   Medications: See med rec.  Review of Systems: No fevers, chills, night sweats, weight loss, chest pain, or shortness of breath.   Objective:    General: Well Developed, well nourished, and in no acute distress.  Neuro: Alert and oriented x3, extra-ocular muscles intact, sensation grossly intact.  HEENT: Normocephalic, atraumatic, pupils equal round reactive to light, neck supple, no masses, no lymphadenopathy, thyroid nonpalpable.  Skin: Warm and dry, no rashes. Cardiac: Regular rate and rhythm, no murmurs rubs or gallops, no lower extremity edema.  Respiratory: Clear to auscultation bilaterally. Not using accessory muscles, speaking in full sentences. Back: Bruising over the posterior rib cage, tender to palpation in the lower thoracic and upper lumbar spinous processes.  Neurovascularly intact distally.  X-rays reviewed, significant degenerative changes but no obvious vertebral or rib fractures  Impression and Recommendations:    Accidental fall Pain over the thoracolumbar spine, spinous processes, as well as right posterior rib cage, x-rays of all of the above. Short course of hydrocodone, return to see me in a week.   ___________________________________________ Gwen Her. Dianah Field, M.D., ABFM., CAQSM. Primary Care and Sports Medicine [facility]  Adjunct Professor of [place] of [employer] [employer] [facility] [employer]

## 2018-10-28 ENCOUNTER — Ambulatory Visit: Payer: BLUE CROSS/BLUE SHIELD | Admitting: Sports Medicine

## 2018-10-29 ENCOUNTER — Encounter: Payer: Self-pay | Admitting: Physician Assistant

## 2018-11-05 ENCOUNTER — Encounter: Payer: Self-pay | Admitting: Sports Medicine

## 2018-11-05 ENCOUNTER — Ambulatory Visit: Payer: BLUE CROSS/BLUE SHIELD | Admitting: Sports Medicine

## 2018-11-05 DIAGNOSIS — M546 Pain in thoracic spine: Secondary | ICD-10-CM

## 2018-11-05 DIAGNOSIS — W19XXXD Unspecified fall, subsequent encounter: Secondary | ICD-10-CM | POA: Diagnosis not present

## 2018-11-05 DIAGNOSIS — M545 Low back pain: Secondary | ICD-10-CM

## 2018-11-05 NOTE — Progress Notes (Signed)
Subjective:    CC: Follow-up  HPI: Cynthia [name] had a fall, she is doing well, nothing was broken on the thoracic or lumbar spine x-rays.  I reviewed the past medical history, family history, social history, surgical history, and allergies today and no changes were needed.  Please see the problem list section below in epic for further details.  Past Medical History: Past Medical History:  Diagnosis Date  . Acoustic neuroma ([name])   . ADD (attention deficit disorder)    and OCD-on Vyvanse  . Asthma    w/out status asthmaticus  . Awareness under anesthesia   . Balance problem    after acoustic neuroma excision  . Benign brain tumor ([name])   . Chronic back pain   . Constipation, chronic    IBS, CIC  . Diabetes mellitus   . Difficult airway   . 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 GERD (gastroesophageal reflux disease)   . Iron deficiency anemia, unspecified 02/18/2013  . Obesity    296 lbs (highest weight in 2012)   . Tendonitis, Achilles, left    Past Surgical History: Past Surgical History:  Procedure Laterality Date  . ABLATION     and bladder sling  . ACHILLES TENDON REPAIR     x2  . ANAL FISSURE REPAIR  2012  . BACK SURGERY    . CHOLECYSTECTOMY    . CRANIOTOMY  09/13/2015   with excision of acoustic neuroma  . IMPLANTATION BONE ANCHORED HEARING AID    . MOUTH SURGERY  2015   3 implants  . OVARIAN CYST REMOVAL     x4  . THYROID LOBECTOMY Right 04/28/2015   Partial   . TOE FUSION Left    Social History: Social History   Socioeconomic History  . Marital status: Married    Spouse name: Not on file  . Number of children: Not on file  . Years of education: Not on file  . Highest education level: Not on file  Occupational History  . Not on file  Social Needs  . Financial resource strain: Not on file  . Food insecurity:    Worry: Not on file    Inability: Not on file  . Transportation needs:    Medical: Not on file    Non-medical: Not on file   Tobacco Use  . Smoking status: Never Smoker  . Smokeless tobacco: Never Used  Substance and Sexual Activity  . Alcohol use: Yes    Alcohol/week: 1.0 - 2.0 standard drinks    Types: 1 - 2 Standard drinks or equivalent per week  . Drug use: No  . Sexual activity: Yes    Partners: Male    Birth control/protection: Other-see comments    Comment: vasectomy  Lifestyle  . Physical activity:    Days per week: Not on file    Minutes per session: Not on file  . Stress: Not on file  Relationships  . Social connections:    Talks on phone: Not on file    Gets together: Not on file    Attends religious service: Not on file    Active member of club or organization: Not on file    Attends meetings of clubs or organizations: Not on file    Relationship status: Not on file  Other Topics Concern  . Not on file  Social History Narrative   Marital Status:  Married [name])    Children:  None    Pets:  Dog (01)    Living Situation: Lives with husband     Occupation:  IT - [employer]    Education:  Master's Degree    Tobacco Use/Exposure:  None    Alcohol Use:  Occasional   Drug Use:  None   Diet:  Regular   Exercise:  Kick-boxing and boot camp - 5 times per week    Hobbies: Golf, tennis and working out               Family History: Family History  Problem Relation Age of Onset  . Thyroid disease Mother   . Heart Problems Mother        vavle issue  . Dystonia Mother   . Hypercholesterolemia Mother   . Heart attack Father   . Cancer Father        esophageal  . Heart disease Father   . Lung cancer Paternal Grandfather   . Heart disease Paternal Grandfather   . Heart disease Maternal Grandfather   . Heart disease Paternal Grandmother   . Heart disease Other        paternal and maternal side  . Cancer Brother        esophageal/stomach cancer   Allergies: Allergies  Allergen Reactions  . Mobic [Meloxicam] Nausea And Vomiting  . Multivitamins Other (See  Comments)   Medications: See med rec.  Review of Systems: No fevers, chills, night sweats, weight loss, chest pain, or shortness of breath.   Objective:    General: Well Developed, well nourished, and in no acute distress.  Neuro: Alert and oriented x3, extra-ocular muscles intact, sensation grossly intact.  HEENT: Normocephalic, atraumatic, pupils equal round reactive to light, neck supple, no masses, no lymphadenopathy, thyroid nonpalpable.  Skin: Warm and dry, no rashes. Cardiac: Regular rate and rhythm, no murmurs rubs or gallops, no lower extremity edema.  Respiratory: Clear to auscultation bilaterally. Not using accessory muscles, speaking in full sentences.  Impression and Recommendations:    Accidental fall No fractures, doing much better, return as needed   ___________________________________________ Gwen Her. Dianah Field, M.D., ABFM., CAQSM. Primary Care and Sports Medicine [facility]  Adjunct Professor of [employer]

## 2018-11-05 NOTE — Assessment & Plan Note (Signed)
No fractures, doing much better, return as needed

## 2018-11-17 ENCOUNTER — Telehealth: Payer: Self-pay | Admitting: Neurology

## 2018-11-17 NOTE — Telephone Encounter (Signed)
Received request from [facility] on patient asking for alternative that is covered by insurance instead of Celebrex. [facility] states 90 days at [facility] is $39.21. I called patient and she states she does use [facility] already and [facility] must have this on an auto refill. She will contact them.

## 2018-11-28 NOTE — Telephone Encounter (Signed)
error 

## 2018-11-29 ENCOUNTER — Other Ambulatory Visit: Payer: Self-pay | Admitting: Physician Assistant

## 2018-11-29 DIAGNOSIS — R454 Irritability and anger: Secondary | ICD-10-CM

## 2018-12-17 ENCOUNTER — Ambulatory Visit (INDEPENDENT_AMBULATORY_CARE_PROVIDER_SITE_OTHER): Payer: BC Managed Care – PPO | Admitting: Physician Assistant

## 2018-12-17 ENCOUNTER — Encounter: Payer: Self-pay | Admitting: Physician Assistant

## 2018-12-17 DIAGNOSIS — F900 Attention-deficit hyperactivity disorder, predominantly inattentive type: Secondary | ICD-10-CM | POA: Diagnosis not present

## 2018-12-17 MED ORDER — AMPHETAMINE-DEXTROAMPHET ER 30 MG PO CP24: 30.0000 mg | ORAL_CAPSULE | ORAL | 0 refills | Status: DC

## 2018-12-17 NOTE — Progress Notes (Signed)
Patient states she got a letter from insurance that they will no longer cover Vyvanse. Wants to talk about options for ongoing ADHD treatment.

## 2018-12-17 NOTE — Progress Notes (Signed)
Patient ID: Cynthia [name], female   DOB: 1961-04-04, 58 y.o.   MRN: [id number] .[name] KitchenVirtual Visit via Telephone Note  I connected with Cynthia [name] on 12/17/18 at 10:30 AM EDT by telephone and verified that I am speaking with the correct person using two identifiers.  Location: Patient: home Provider: clinic   I discussed the limitations, risks, security and privacy concerns of performing an evaluation and management service by telephone and the availability of in person appointments. I also discussed with the patient that there may be a patient responsible charge related to this service. The patient expressed understanding and agreed to proceed.   History of Present Illness: Pt is a 58 yo female with asthma, hx of thyroid cancer, ADD who calls in for refills.   She was doing great on vyvanse for years. Her insurance will no longer pay for it. She is going to have to switch to something else.   Otherwise no problems or concerns.   She fell back in may but she is doing much better now.   .. Active Ambulatory Problems    Diagnosis Date Noted  . Iron deficiency anemia, unspecified 02/18/2013  . Swelling of limb 09/13/2013  . Interdigital neuralgia 04/14/2013  . 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 ADD (attention deficit disorder) 05/11/2014  . Chronic constipation 05/11/2014  . Essential (primary) hypertension 05/11/2014  . Gastro-esophageal reflux disease without esophagitis 05/11/2014  . Asthma, mild persistent 05/11/2014  . 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 Hypokalemia 02/13/2015  . 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 Hashimoto's thyroiditis 06/14/2015  . Subconjunctival hemorrhage of right eye 06/28/2015  . 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 Costochondritis 03/21/2016  . Chronic left-sided low back pain 09/19/2016  . Fatty liver disease, nonalcoholic 62/70/3500  . Left renal mass 03/26/2017  . Obesity (BMI 30.0-34.9) 03/26/2017   . 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 Motion sickness 12/26/2017  . Exposure to influenza 05/07/2018  . Accidental fall 10/27/2018   Resolved Ambulatory Problems    Diagnosis Date Noted  . IFG (impaired fasting glucose) 09/13/2013  . Backache 09/13/2013  . Wheezing 09/13/2013  . Obesity, unspecified 09/13/2013  . Low back pain 05/11/2014  . Thyromegaly 02/13/2015  . Acoustic neuroma ([place]) 12/10/2014  . Left ear hearing loss 10/14/2015  . Irritable 10/17/2016  . Thyroid adenoma 05/03/2015   Past Medical History:  Diagnosis Date  . Asthma   . Awareness under anesthesia   . Balance problem   . Benign brain tumor ([place])   . Chronic back pain   . Constipation, chronic   . Diabetes mellitus   . Difficult airway   . 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  . GERD (gastroesophageal reflux disease)   . Obesity   . Tendonitis, Achilles, left    Reviewed med, allergy, problem list.     Observations/Objective: No acute distress.  Normal breathing.  Normal mood.   . Today's Vitals   12/17/18 0855  BP: 123/80  Pulse: 76  Temp: 98.1 F (36.7 C)  TempSrc: Oral  Weight: 202 lb (91.6 kg)  Height: 5\' 7"  (1.702 m)   Body mass index is 31.64 kg/m.   Assessment and Plan: [name] KitchenMarland KitchenPolina was seen today for adhd.  Diagnoses and all orders for this visit:  Attention deficit hyperactivity disorder (ADHD), predominantly inattentive type -     amphetamine-dextroamphetamine (ADDERALL XR) 30 MG 24 hr capsule; Take 1 capsule (30 mg total) by mouth every morning.  vyvanse  60 switched to adderall XR 30mg . Discussed potential side effects. Follow up in 1 month to make sure tolerate and doing well. Then will give 3 month refills.    Follow Up Instructions:    I discussed the assessment and treatment plan with the patient. The patient was provided an opportunity to ask questions and all were answered. The patient agreed with the plan and demonstrated an understanding of the instructions.   The  patient was advised to call back or seek an in-person evaluation if the symptoms worsen or if the condition fails to improve as anticipated.  I provided 8 minutes of non-face-to-face time during this encounter.   Iran Planas, PA-C

## 2019-01-13 ENCOUNTER — Other Ambulatory Visit: Payer: Self-pay | Admitting: Physician Assistant

## 2019-01-13 DIAGNOSIS — J4530 Mild persistent asthma, uncomplicated: Secondary | ICD-10-CM

## 2019-01-19 ENCOUNTER — Encounter: Payer: Self-pay | Admitting: Physician Assistant

## 2019-01-19 DIAGNOSIS — F900 Attention-deficit hyperactivity disorder, predominantly inattentive type: Secondary | ICD-10-CM

## 2019-01-19 MED ORDER — AMPHETAMINE-DEXTROAMPHET ER 30 MG PO CP24: 30.0000 mg | ORAL_CAPSULE | ORAL | 0 refills | Status: DC

## 2019-02-27 ENCOUNTER — Other Ambulatory Visit: Payer: Self-pay | Admitting: Physician Assistant

## 2019-02-27 DIAGNOSIS — R454 Irritability and anger: Secondary | ICD-10-CM

## 2019-03-04 ENCOUNTER — Other Ambulatory Visit: Payer: Self-pay

## 2019-03-04 ENCOUNTER — Encounter: Payer: Self-pay | Admitting: Physician Assistant

## 2019-03-04 ENCOUNTER — Ambulatory Visit: Payer: BC Managed Care – PPO | Admitting: Physician Assistant

## 2019-03-04 VITALS — BP 118/73 | HR 80 | Ht 67.0 in | Wt 202.0 lb

## 2019-03-04 DIAGNOSIS — Z23 Encounter for immunization: Secondary | ICD-10-CM | POA: Diagnosis not present

## 2019-03-04 DIAGNOSIS — Z13220 Encounter for screening for lipoid disorders: Secondary | ICD-10-CM | POA: Diagnosis not present

## 2019-03-04 DIAGNOSIS — Z131 Encounter for screening for diabetes mellitus: Secondary | ICD-10-CM

## 2019-03-04 DIAGNOSIS — Z8262 Family history of osteoporosis: Secondary | ICD-10-CM

## 2019-03-04 DIAGNOSIS — Z0000 Encounter for general adult medical examination without abnormal findings: Secondary | ICD-10-CM

## 2019-03-04 DIAGNOSIS — F900 Attention-deficit hyperactivity disorder, predominantly inattentive type: Secondary | ICD-10-CM

## 2019-03-04 DIAGNOSIS — Z1239 Encounter for other screening for malignant neoplasm of breast: Secondary | ICD-10-CM

## 2019-03-04 DIAGNOSIS — E039 Hypothyroidism, unspecified: Secondary | ICD-10-CM

## 2019-03-04 DIAGNOSIS — Z79899 Other long term (current) drug therapy: Secondary | ICD-10-CM

## 2019-03-04 DIAGNOSIS — Z780 Asymptomatic menopausal state: Secondary | ICD-10-CM

## 2019-03-04 NOTE — Progress Notes (Signed)
Subjective:    Patient ID: Cynthia [name], female    DOB: [birth year], 58 y.o.   MRN: [id number]  HPI Pt is a 58 yo female with ADD, HTN, hashimoto disease who presents to the clinic for 3 month follow up.   She is doing well today. No problems or concerns. She is sleeping well. No increase in anxiety. She is doing well at work.   She is taking her thyroid medication daily. No problems or concerns.   .. Active Ambulatory Problems    Diagnosis Date Noted  . Iron deficiency anemia, unspecified 02/18/2013  . Swelling of limb 09/13/2013  . Interdigital neuralgia 04/14/2013  . 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 ADD (attention deficit disorder) 05/11/2014  . Chronic constipation 05/11/2014  . Essential (primary) hypertension 05/11/2014  . Gastro-esophageal reflux disease without esophagitis 05/11/2014  . Asthma, mild persistent 05/11/2014  . 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 Hypokalemia 02/13/2015  . 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 Hashimoto's thyroiditis 06/14/2015  . Subconjunctival hemorrhage of right eye 06/28/2015  . 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 Costochondritis 03/21/2016  . Chronic left-sided low back pain 09/19/2016  . Fatty liver disease, nonalcoholic 123456  . Left renal mass 03/26/2017  . Obesity (BMI 30.0-34.9) 03/26/2017  . 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 Motion sickness 12/26/2017  . Exposure to influenza 05/07/2018  . Accidental fall 10/27/2018  . Family history of osteoporosis 03/05/2019  . Post-menopausal 03/05/2019   Resolved Ambulatory Problems    Diagnosis Date Noted  . IFG (impaired fasting glucose) 09/13/2013  . Backache 09/13/2013  . Wheezing 09/13/2013  . Obesity, unspecified 09/13/2013  . Low back pain 05/11/2014  . Thyromegaly 02/13/2015  . Acoustic neuroma ([place]) 12/10/2014  . Left ear hearing loss 10/14/2015  . Irritable 10/17/2016  . Thyroid adenoma 05/03/2015    Past Medical History:  Diagnosis Date  . Asthma   . Awareness under anesthesia   . Balance problem   . Benign brain tumor ([place])   . Chronic back pain   . Constipation, chronic   . Diabetes mellitus   . Difficult airway   . 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  . GERD (gastroesophageal reflux disease)   . Obesity   . Tendonitis, Achilles, left       Review of Systems  All other systems reviewed and are negative.      Objective:   Physical Exam Vitals signs reviewed.  Constitutional:      Appearance: Normal appearance.  Cardiovascular:     Rate and Rhythm: Normal rate and regular rhythm.     Pulses: Normal pulses.  Pulmonary:     Effort: Pulmonary effort is normal.     Breath sounds: Normal breath sounds.  Neurological:     General: No focal deficit present.     Mental Status: She is alert and oriented to person, place, and time.  Psychiatric:        Mood and Affect: Mood normal.           Assessment & Plan:  [name] KitchenMarland KitchenNeily was seen today for adhd.  Diagnoses and all orders for this visit:  Attention deficit hyperactivity disorder (ADHD), predominantly inattentive type -     amphetamine-dextroamphetamine (ADDERALL XR) 30 MG 24 hr capsule; Take 1 capsule (30 mg total) by mouth every morning. -     amphetamine-dextroamphetamine (ADDERALL XR) 30 MG 24 hr capsule; Take 1 capsule (30 mg total) by mouth every morning. -  amphetamine-dextroamphetamine (ADDERALL XR) 30 MG 24 hr capsule; Take 1 capsule (30 mg total) by mouth every morning.  Preventative health care -     Lipid Panel w/reflex Direct LDL -     COMPLETE METABOLIC PANEL WITH GFR -     TSH -     CBC  Screening for diabetes mellitus -     COMPLETE METABOLIC PANEL WITH GFR  Screening for lipid disorders -     Lipid Panel w/reflex Direct LDL  Hypothyroidism, unspecified type -     TSH  Post-menopausal -     DG Bone Density; Future  Screening for breast cancer -     MM 3D SCREEN BREAST BILATERAL; Future  Flu  vaccine need -     Flu Vaccine QUAD 36+ mos IM  Family history of osteoporosis  Medication management    Refilled adderall for 3 months.   Pt needed fasting labs and they were ordered.  Mammogram ordered.  Pt is not taking vitamin D, her mother has osteoporosis, she has been on long term PPI and post menopausal. Ordered dexa.   Flu shot given in the gluteus maximus due to radicular pain after flu shot 2 years ago.

## 2019-03-05 ENCOUNTER — Encounter: Payer: Self-pay | Admitting: Physician Assistant

## 2019-03-05 DIAGNOSIS — Z8262 Family history of osteoporosis: Secondary | ICD-10-CM | POA: Insufficient documentation

## 2019-03-05 DIAGNOSIS — Z780 Asymptomatic menopausal state: Secondary | ICD-10-CM | POA: Insufficient documentation

## 2019-03-05 MED ORDER — AMPHETAMINE-DEXTROAMPHET ER 30 MG PO CP24: 30.0000 mg | ORAL_CAPSULE | ORAL | 0 refills | Status: DC

## 2019-04-15 ENCOUNTER — Other Ambulatory Visit: Payer: Self-pay

## 2019-04-17 ENCOUNTER — Ambulatory Visit: Payer: BC Managed Care – PPO | Admitting: Obstetrics & Gynecology

## 2019-05-03 ENCOUNTER — Other Ambulatory Visit: Payer: Self-pay | Admitting: Physician Assistant

## 2019-05-03 DIAGNOSIS — I10 Essential (primary) hypertension: Secondary | ICD-10-CM

## 2019-05-24 ENCOUNTER — Other Ambulatory Visit: Payer: Self-pay | Admitting: Physician Assistant

## 2019-05-24 DIAGNOSIS — M5416 Radiculopathy, lumbar region: Secondary | ICD-10-CM

## 2019-05-27 DIAGNOSIS — Z131 Encounter for screening for diabetes mellitus: Secondary | ICD-10-CM | POA: Diagnosis not present

## 2019-05-27 DIAGNOSIS — Z0000 Encounter for general adult medical examination without abnormal findings: Secondary | ICD-10-CM | POA: Diagnosis not present

## 2019-05-27 DIAGNOSIS — Z13220 Encounter for screening for lipoid disorders: Secondary | ICD-10-CM | POA: Diagnosis not present

## 2019-05-27 DIAGNOSIS — H53453 Other localized visual field defect, bilateral: Secondary | ICD-10-CM | POA: Diagnosis not present

## 2019-05-27 DIAGNOSIS — E039 Hypothyroidism, unspecified: Secondary | ICD-10-CM | POA: Diagnosis not present

## 2019-05-28 ENCOUNTER — Other Ambulatory Visit: Payer: Self-pay | Admitting: Physician Assistant

## 2019-05-28 DIAGNOSIS — R454 Irritability and anger: Secondary | ICD-10-CM

## 2019-05-28 LAB — CBC
HCT: 39.9 % (ref 35.0–45.0)
Hemoglobin: 13.9 g/dL (ref 11.7–15.5)
MCH: 29.3 pg (ref 27.0–33.0)
MCHC: 34.8 g/dL (ref 32.0–36.0)
MCV: 84 fL (ref 80.0–100.0)
MPV: 9.1 fL (ref 7.5–12.5)
Platelets: 178 10*3/uL (ref 140–400)
RBC: 4.75 10*6/uL (ref 3.80–5.10)
RDW: 12.6 % (ref 11.0–15.0)
WBC: 6.5 10*3/uL (ref 3.8–10.8)

## 2019-05-28 LAB — COMPLETE METABOLIC PANEL WITH GFR
AG Ratio: 1.9 (calc) (ref 1.0–2.5)
ALT: 16 U/L (ref 6–29)
AST: 19 U/L (ref 10–35)
Albumin: 4.4 g/dL (ref 3.6–5.1)
Alkaline phosphatase (APISO): 67 U/L (ref 37–153)
BUN: 18 mg/dL (ref 7–25)
CO2: 30 mmol/L (ref 20–32)
Calcium: 9.2 mg/dL (ref 8.6–10.4)
Chloride: 100 mmol/L (ref 98–110)
Creat: 0.99 mg/dL (ref 0.50–1.05)
GFR, Est African American: 74 mL/min/{1.73_m2} (ref >=60)
GFR, Est Non African American: 64 mL/min/{1.73_m2} (ref >=60)
Globulin: 2.3 g/dL (calc) (ref 1.9–3.7)
Glucose, Bld: 99 mg/dL (ref 65–99)
Potassium: 3.6 mmol/L (ref 3.5–5.3)
Sodium: 139 mmol/L (ref 135–146)
Total Bilirubin: 0.9 mg/dL (ref 0.2–1.2)
Total Protein: 6.7 g/dL (ref 6.1–8.1)

## 2019-05-28 LAB — LIPID PANEL W/REFLEX DIRECT LDL
Cholesterol: 184 mg/dL (ref <200)
HDL: 76 mg/dL (ref >=50)
LDL Cholesterol (Calc): 91 mg/dL (calc)
Non-HDL Cholesterol (Calc): 108 mg/dL (calc) (ref <130)
Total CHOL/HDL Ratio: 2.4 (calc) (ref <5.0)
Triglycerides: 79 mg/dL (ref <150)

## 2019-05-28 LAB — TSH: TSH: 3.91 mIU/L (ref 0.40–4.50)

## 2019-05-29 ENCOUNTER — Telehealth: Payer: Self-pay | Admitting: Internal Medicine

## 2019-05-29 NOTE — Telephone Encounter (Signed)
Patient requests to be called at ph# [phone] re: patient had blood work done which shows TSH level of 3.9 (labs are in [place]). Patient would like to be advised re: medication dosage considering the above lab result.

## 2019-05-29 NOTE — Progress Notes (Signed)
Cynthia [name],   Cholesterol looks great. Kidney, liver, glucose looks great. Thyroid in normal range but not optimal level of 1-2. You are 3.91. we could consider increasing your synthroid to see if you feel better in optimal range. Thoughts?   -Luvenia Starch

## 2019-05-29 NOTE — Telephone Encounter (Signed)
No change in dose is necessary.  We will recheck it when she comes back in February.

## 2019-06-01 NOTE — Telephone Encounter (Signed)
Notified patient of message from Dr. Gherghe, patient expressed understanding and agreement. No further questions.  

## 2019-06-03 ENCOUNTER — Ambulatory Visit (INDEPENDENT_AMBULATORY_CARE_PROVIDER_SITE_OTHER): Payer: BC Managed Care – PPO | Admitting: Physician Assistant

## 2019-06-03 ENCOUNTER — Encounter: Payer: Self-pay | Admitting: Physician Assistant

## 2019-06-03 VITALS — BP 131/89 | HR 78 | Temp 98.6°F | Ht 67.0 in | Wt 194.0 lb

## 2019-06-03 DIAGNOSIS — N952 Postmenopausal atrophic vaginitis: Secondary | ICD-10-CM | POA: Diagnosis not present

## 2019-06-03 DIAGNOSIS — Z780 Asymptomatic menopausal state: Secondary | ICD-10-CM

## 2019-06-03 DIAGNOSIS — Z1331 Encounter for screening for depression: Secondary | ICD-10-CM

## 2019-06-03 DIAGNOSIS — F900 Attention-deficit hyperactivity disorder, predominantly inattentive type: Secondary | ICD-10-CM

## 2019-06-03 DIAGNOSIS — D333 Benign neoplasm of cranial nerves: Secondary | ICD-10-CM

## 2019-06-03 DIAGNOSIS — E063 Autoimmune thyroiditis: Secondary | ICD-10-CM

## 2019-06-03 MED ORDER — LEVOTHYROXINE SODIUM 75 MCG PO TABS: 75.0000 ug | ORAL_TABLET | Freq: Every day | ORAL | 1 refills | Status: DC

## 2019-06-03 MED ORDER — AMPHETAMINE-DEXTROAMPHET ER 30 MG PO CP24: 30.0000 mg | ORAL_CAPSULE | ORAL | 0 refills | Status: DC

## 2019-06-03 MED ORDER — ESTRADIOL 0.1 MG/GM VA CREA: TOPICAL_CREAM | VAGINAL | 6 refills | Status: DC

## 2019-06-03 NOTE — Progress Notes (Deleted)
Needs refills on Adderall.   Wants to discuss Thyroid level Having some fatigue Endocrine had different advise, wants to discuss  PHQ9-GAD7 completed.

## 2019-06-03 NOTE — Progress Notes (Signed)
Patient ID: Cynthia [name], female   DOB: Jan 03, 1961, 58 y.o.   MRN: [id number] .[name] KitchenVirtual Visit via Telephone Note  I connected with [name] on 06/03/19 at  8:10 AM EST by telephone and verified that I am speaking with the correct person using two identifiers.  Location: Patient: work Provider: clinic   I discussed the limitations, risks, security and privacy concerns of performing an evaluation and management service by telephone and the availability of in person appointments. I also discussed with the patient that there may be a patient responsible charge related to this service. The patient expressed understanding and agreed to proceed.   History of Present Illness: Pt is a 58 yo female with HTN, asthma, GERD, hashimoto's thyroidism, left acoustic neuroma, ADD who calls into the clinic for medication refills.   ADD- doing well. No problems or concerns. No CP, palpitations, headaches, insomnia. Doing well at work.   She has noticed a increase in fatigue and mood being more anxious to depressed. She feels almost a little fuzzy headed. Her TSH levels are up from her baseline of 2 but still in normal range. Her endocrinologist did not want to make any changes. She would like to try an increase in levothyroxine to see if would help with symptoms.   She just saw opthalmology and notice nerve changes in her right eye. She previously only had vision changes in left eye. No hearing loss or dizziness. She will occasionally see dark spots in vision. Has follow up scheduled.    .. Active Ambulatory Problems    Diagnosis Date Noted  . Iron deficiency anemia, unspecified 02/18/2013  . Interdigital neuralgia 04/14/2013  . 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 ADD (attention deficit disorder) 05/11/2014  . Chronic constipation 05/11/2014  . Essential (primary) hypertension 05/11/2014  . Gastro-esophageal reflux disease without esophagitis 05/11/2014  . Asthma, mild persistent 05/11/2014  . Left acoustic  neuroma ([place]) 02/11/2015  . Hypokalemia 02/13/2015  . 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 Hashimoto's thyroiditis 06/14/2015  . 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 Chronic left-sided low back pain 09/19/2016  . Fatty liver disease, nonalcoholic 123456  . Left renal mass 03/26/2017  . Obesity (BMI 30.0-34.9) 03/26/2017  . 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 Motion sickness 12/26/2017  . Family history of osteoporosis 03/05/2019  . Post-menopausal 03/05/2019  . Atrophic vaginitis 06/03/2019   Resolved Ambulatory Problems    Diagnosis Date Noted  . IFG (impaired fasting glucose) 09/13/2013  . Backache 09/13/2013  . Wheezing 09/13/2013  . Swelling of limb 09/13/2013  . Obesity, unspecified 09/13/2013  . Low back pain 05/11/2014  . Thyromegaly 02/13/2015  . Subconjunctival hemorrhage of right eye 06/28/2015  . Acoustic neuroma ([place]) 12/10/2014  . Left ear hearing loss 10/14/2015  . Costochondritis 03/21/2016  . Irritable 10/17/2016  . Thyroid adenoma 05/03/2015  . Exposure to influenza 05/07/2018  . Accidental fall 10/27/2018   Past Medical History:  Diagnosis Date  . Asthma   . Awareness under anesthesia   . Balance problem   . Benign brain tumor ([place])   . Chronic back pain   . Constipation, chronic   . Diabetes mellitus   . Difficult airway   . 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  . GERD (gastroesophageal reflux disease)   . Obesity   . Tendonitis, Achilles, left       Observations/Objective: No acute distress. Normal mood.  No labored breathing.   .. Today's Vitals  06/03/19 0744  BP: 131/89  Pulse: 78  Temp: 98.6 F (37 C)  TempSrc: Oral  Weight: 194 lb (88 kg)  Height: 5\' 7"  (1.702 m)   Body mass index is 30.38 kg/m.   Depression screen [facility] 2/9 06/03/2019 12/17/2018 06/25/2018 12/24/2017 03/26/2017  Decreased Interest 0 0 0 0 0  Down, Depressed, Hopeless 0 0 0 0 0  PHQ - 2 Score 0 0 0 0 0  Altered  sleeping - - 1 0 -  Tired, decreased energy - - 1 1 -  Change in appetite - - 1 0 -  Feeling bad or failure about yourself  - - 0 0 -  Trouble concentrating - - 0 0 -  Moving slowly or fidgety/restless - - 0 0 -  Suicidal thoughts - - 0 0 -  PHQ-9 Score - - 3 1 -  Difficult doing work/chores - - Not difficult at all Not difficult at all -   .. GAD 7 : Generalized Anxiety Score 06/03/2019 12/17/2018 06/25/2018 12/24/2017  Nervous, Anxious, on Edge 0 1 0 0  Control/stop worrying 0 0 0 0  Worry too much - different things 0 0 0 0  Trouble relaxing 1 0 1 0  Restless 2 1 1  0  Easily annoyed or irritable 3 0 0 0  Afraid - awful might happen 0 0 0 0  Total GAD 7 Score 6 2 2  0  Anxiety Difficulty Not difficult at all Not difficult at all Not difficult at all Not difficult at all     Assessment and Plan: [name] KitchenMarland KitchenMarlei was seen today for adhd.  Diagnoses and all orders for this visit:  Attention deficit hyperactivity disorder (ADHD), predominantly inattentive type -     amphetamine-dextroamphetamine (ADDERALL XR) 30 MG 24 hr capsule; Take 1 capsule (30 mg total) by mouth every morning. -     amphetamine-dextroamphetamine (ADDERALL XR) 30 MG 24 hr capsule; Take 1 capsule (30 mg total) by mouth every morning. -     amphetamine-dextroamphetamine (ADDERALL XR) 30 MG 24 hr capsule; Take 1 capsule (30 mg total) by mouth every morning.  Hashimoto's thyroiditis -     levothyroxine (SYNTHROID) 75 MCG tablet; Take 1 tablet (75 mcg total) by mouth daily. -     TSH; Future  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[facility])  Atrophic vaginitis -     estradiol (ESTRACE) 0.1 MG/GM vaginal cream; 1 gram vaginally twice weekly  Post-menopausal -     estradiol (ESTRACE) 0.1 MG/GM vaginal cream; 1 gram vaginally twice weekly   Refilled adderall for 3 months.   Increased levothyroxine. Goal TSH 1-2. Recheck in 4-6 weeks. Will assess symptoms to see if feeling better.   Follow up With opthalmology  For eye exam changes and just  feeling foggy headed. Follow up with [place] [facility].   Follow Up Instructions:    I discussed the assessment and treatment plan with the patient. The patient was provided an opportunity to ask questions and all were answered. The patient agreed with the plan and demonstrated an understanding of the instructions.   The patient was advised to call back or seek an in-person evaluation if the symptoms worsen or if the condition fails to improve as anticipated.  I provided 20 minutes of non-face-to-face time during this encounter.   Iran Planas, PA-C

## 2019-06-08 DIAGNOSIS — Z23 Encounter for immunization: Secondary | ICD-10-CM | POA: Diagnosis not present

## 2019-06-10 ENCOUNTER — Other Ambulatory Visit: Payer: BC Managed Care – PPO

## 2019-06-10 ENCOUNTER — Ambulatory Visit: Payer: BC Managed Care – PPO

## 2019-06-29 ENCOUNTER — Ambulatory Visit (INDEPENDENT_AMBULATORY_CARE_PROVIDER_SITE_OTHER): Payer: BC Managed Care – PPO

## 2019-06-29 ENCOUNTER — Other Ambulatory Visit: Payer: Self-pay

## 2019-06-29 ENCOUNTER — Ambulatory Visit (INDEPENDENT_AMBULATORY_CARE_PROVIDER_SITE_OTHER): Payer: BC Managed Care – PPO | Admitting: Sports Medicine

## 2019-06-29 DIAGNOSIS — M545 Low back pain, unspecified: Secondary | ICD-10-CM

## 2019-06-29 DIAGNOSIS — G8929 Other chronic pain: Secondary | ICD-10-CM | POA: Diagnosis not present

## 2019-06-29 IMAGING — DX DG SACRUM/COCCYX 2+V
3 series · 3 of 3 positions shown · non-contrast
Comparison: None.

CLINICAL DATA: Chronic left-sided low back pain without sciatica
for 2 days.

EXAM:
SACRUM AND COCCYX - 2+ VIEW

[coccyx ap]
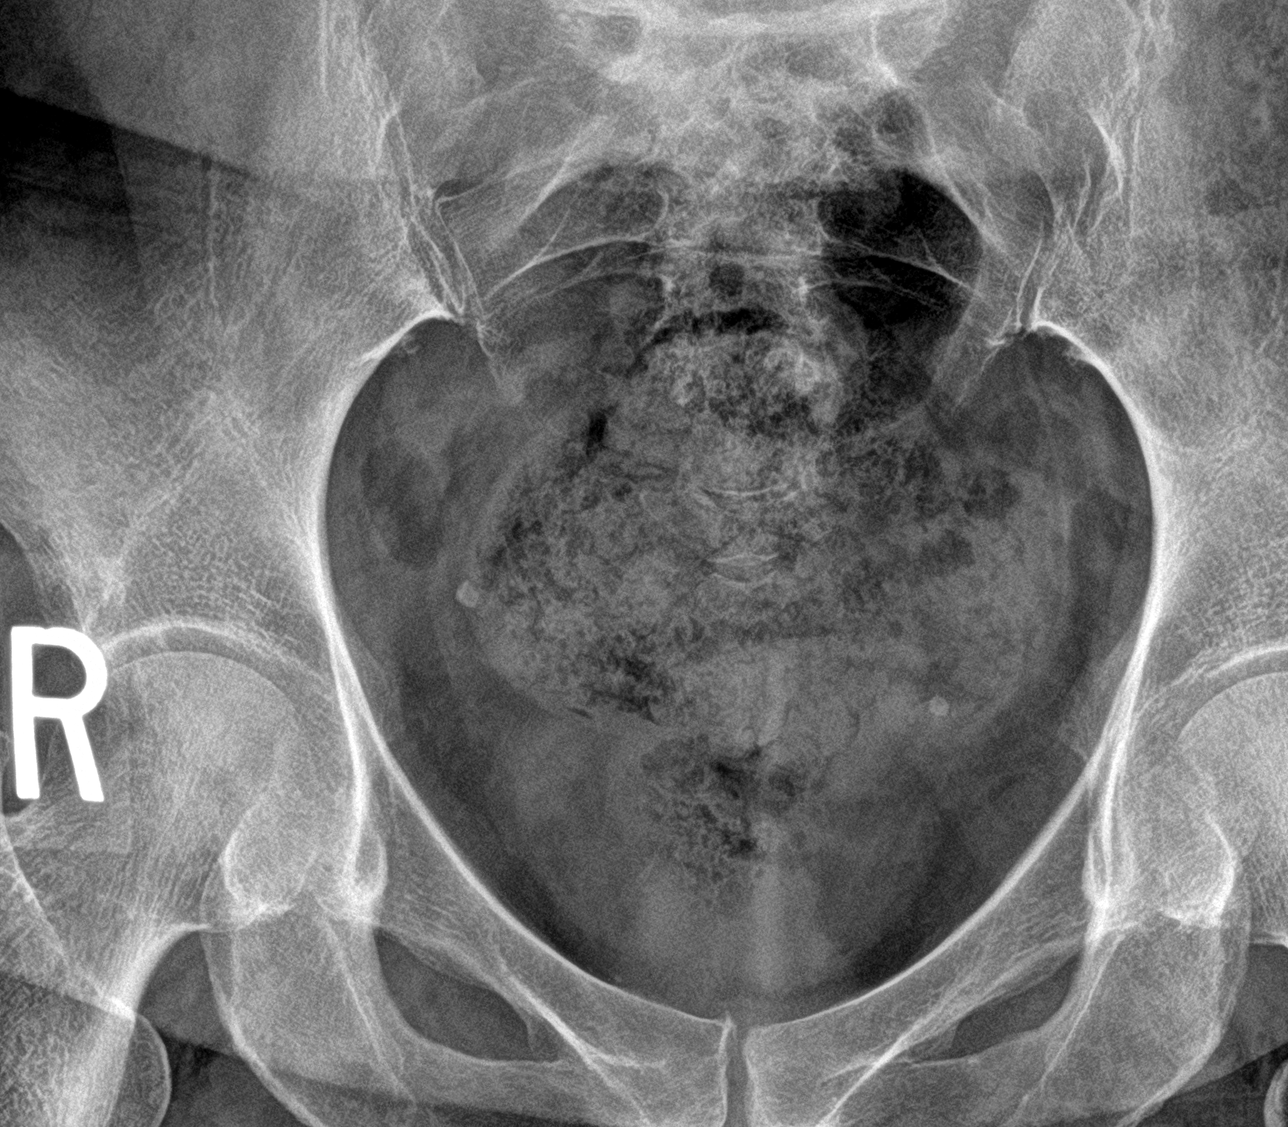

[sacrum ap]
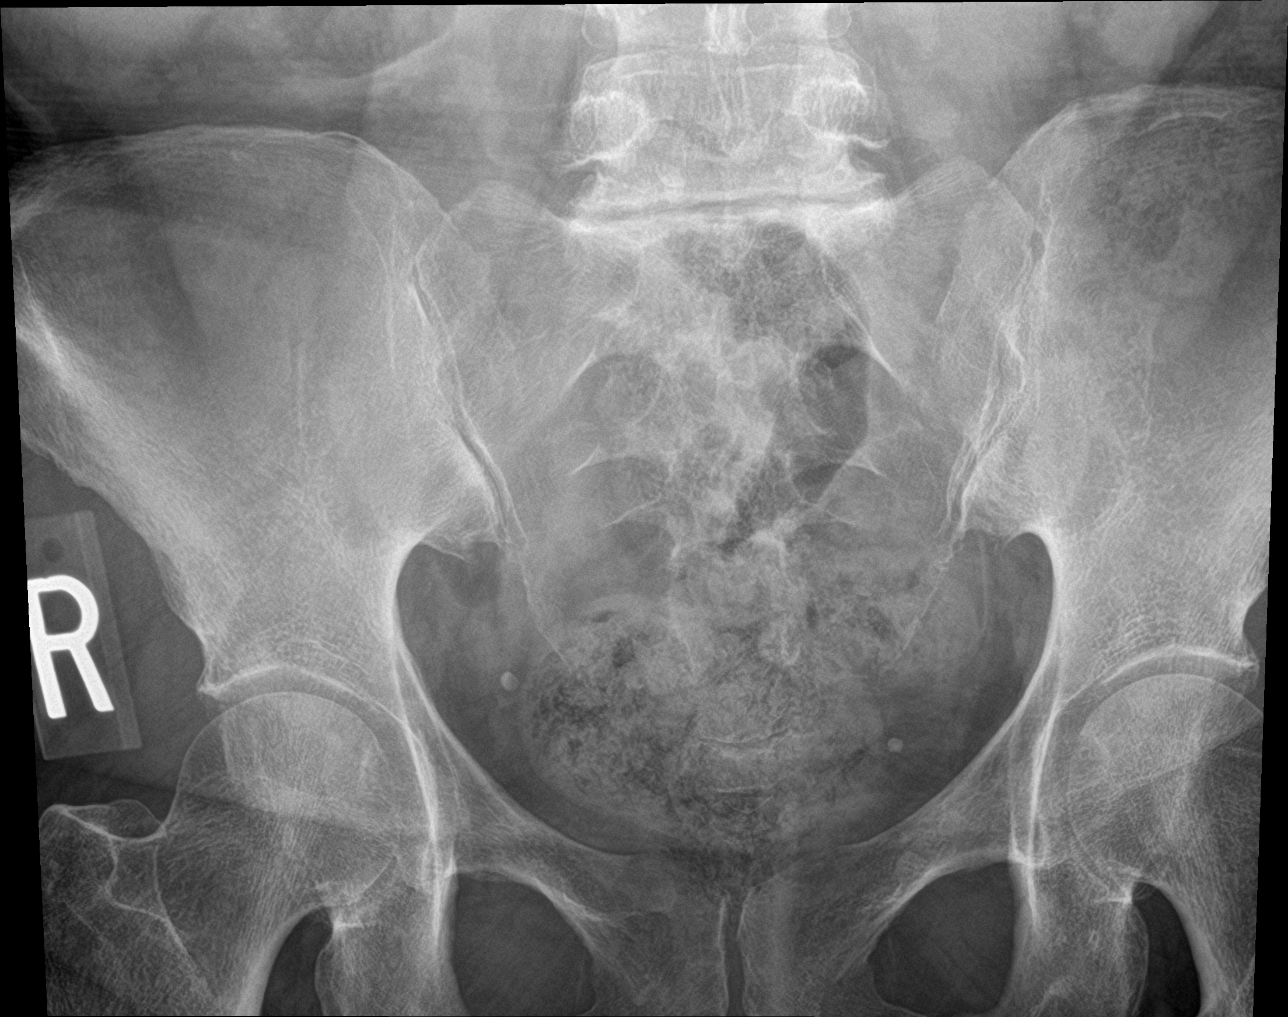

[sacrum lat]
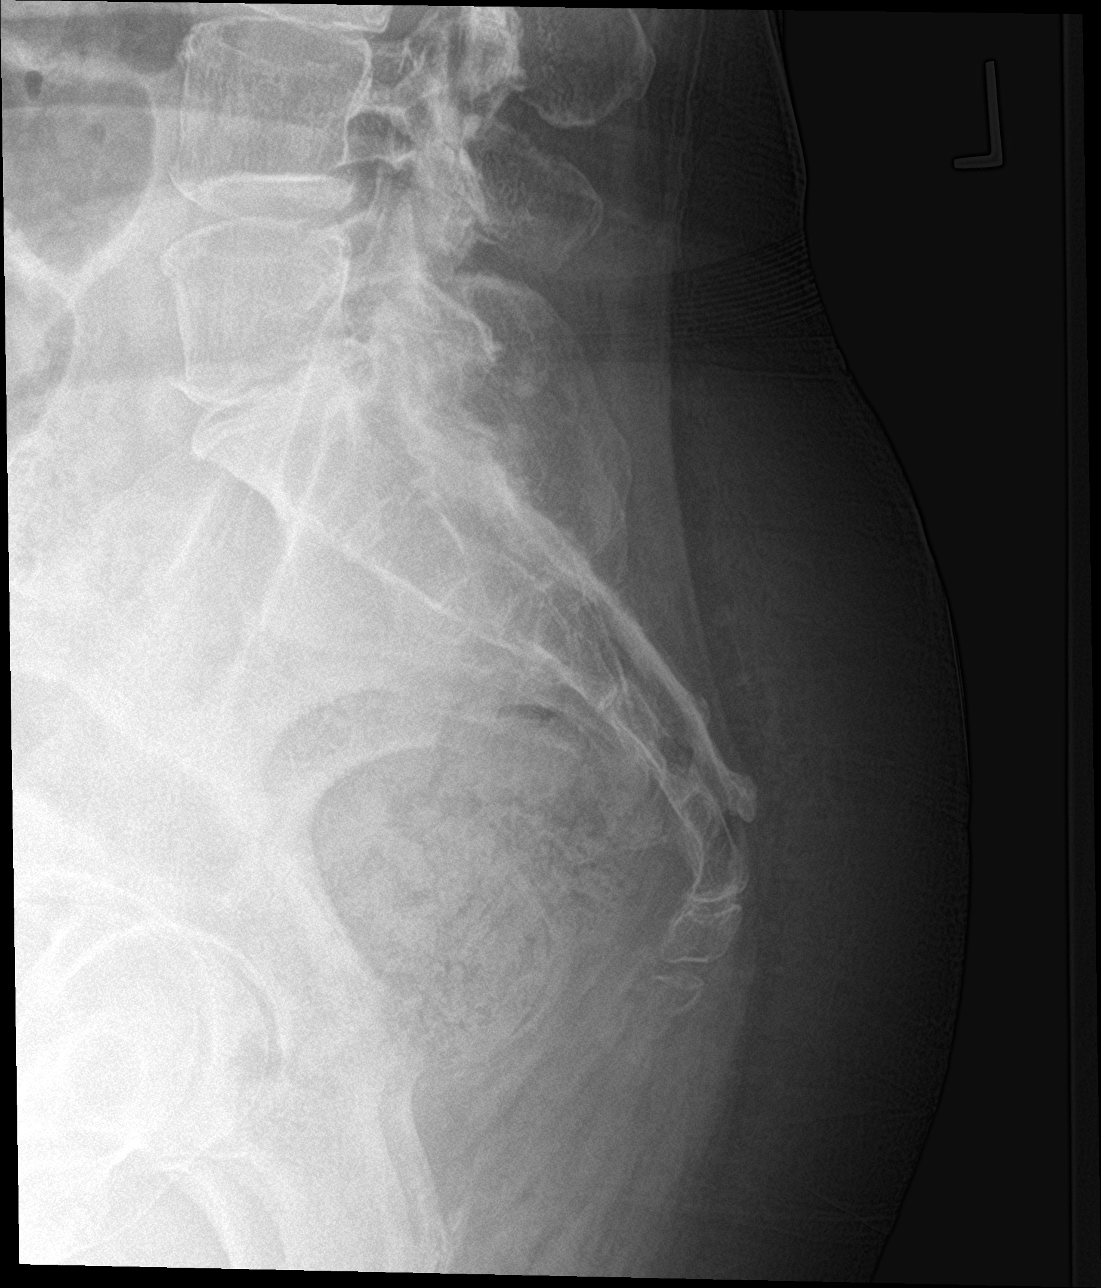

[3 of 3 positions shown; findings below may reference images not displayed]

FINDINGS: Moderate stool is present in the distal colon. No proximal
obstruction is present.

Sacrum and coccyx are within normal limits. No acute or healing
fractures are present. SI joints are unremarkable. Visualized pelvis
is normal.
IMPRESSION: 1. Moderate stool in the distal colon.
2. Normal appearance of the sacrum and coccyx.
3. No acute or healing fractures.

## 2019-06-29 IMAGING — MR MR LUMBAR SPINE W/O CM
4 of 5 series · 25 of 48 positions shown · non-contrast
Comparison: Report from lumbar spine MRI [DATE] (images
unavailable).

CLINICAL DATA: Chronic left-sided low back pain without sciatica.
Persistent left-sided axial low back pain; low back pain, greater
than 6 weeks. Additional history provided: Chronic low back pain
worsening last 2 days, history of lumbar surgery L5-S1 in [I3],
left-sided radiculopathy.

EXAM:
MRI LUMBAR SPINE WITHOUT CONTRAST
TECHNIQUE: Multiplanar, multisequence MR imaging of the lumbar spine was
performed. No intravenous contrast was administered.

[Series 4: T2 · sagittal · 4.0mm · 0.81mm/px · 6 of 15 slices shown (1 of 2)]
[im 1/15]
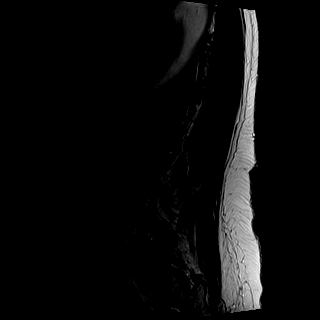
[im 3/15]
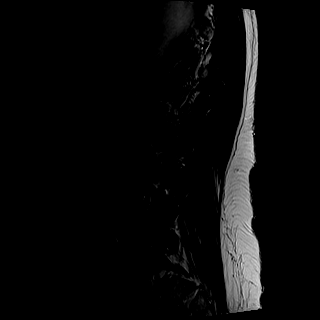
[im 6/15]
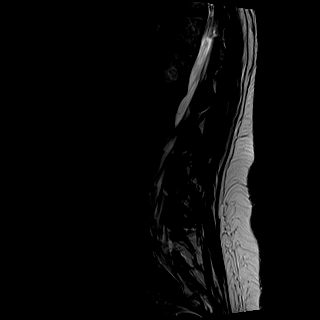
[im 9/15]
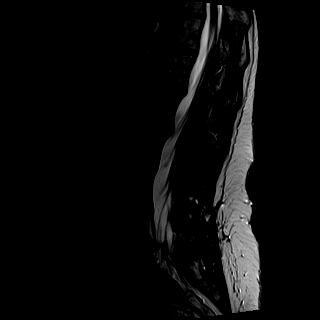
[im 12/15]
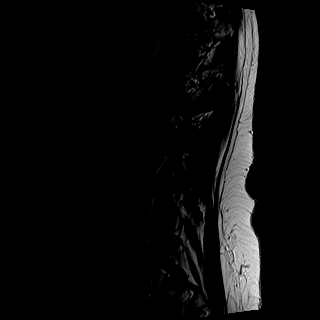
[im 15/15]
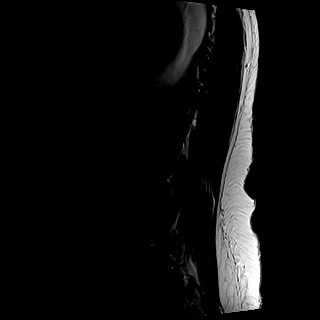

[Series 5: T1 · sagittal · 4.0mm · 0.41mm/px · 6 of 15 slices shown (1 of 2)]
[im 1/15]
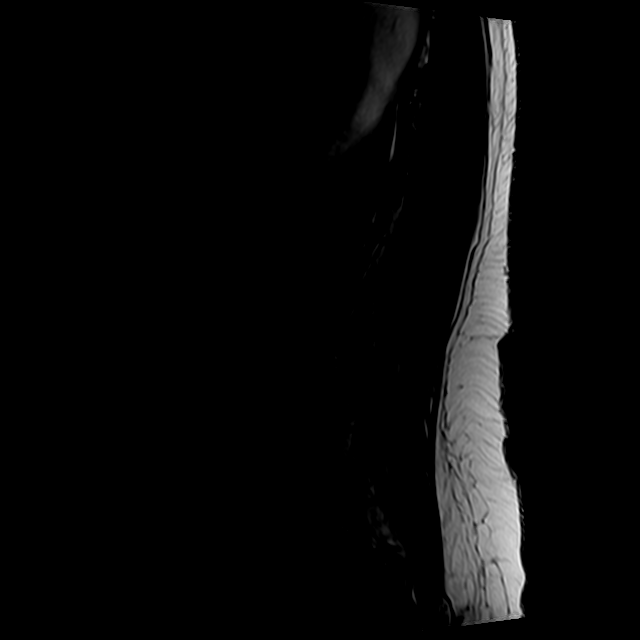
[im 3/15]
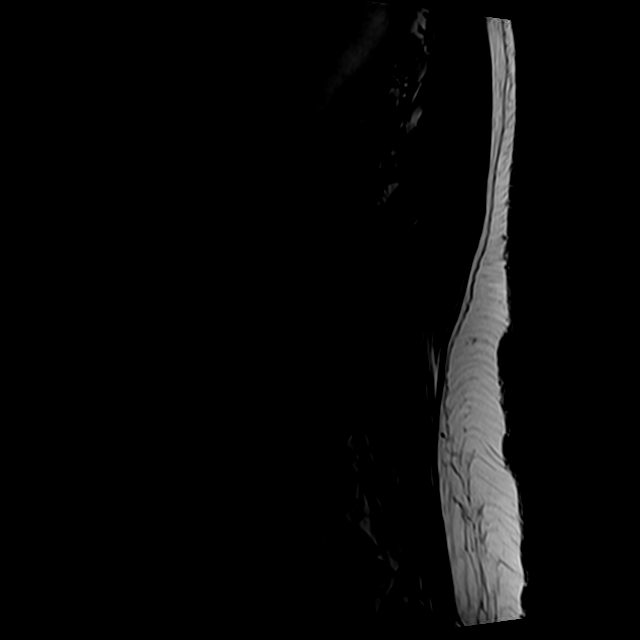
[im 6/15]
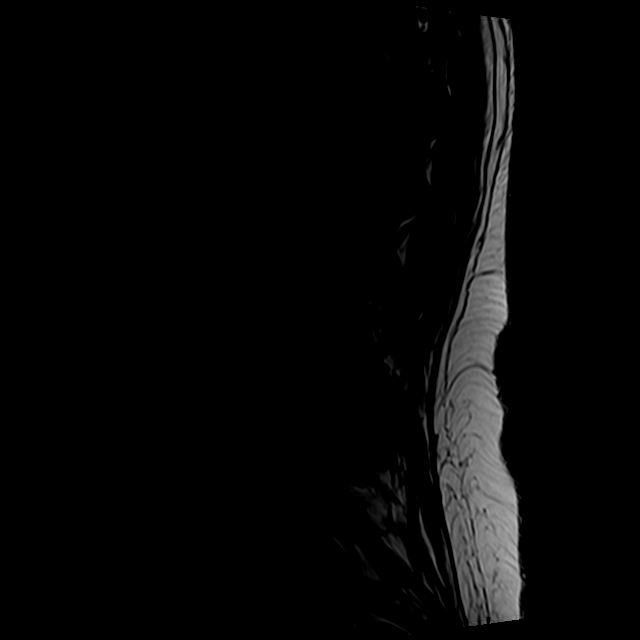
[im 9/15]
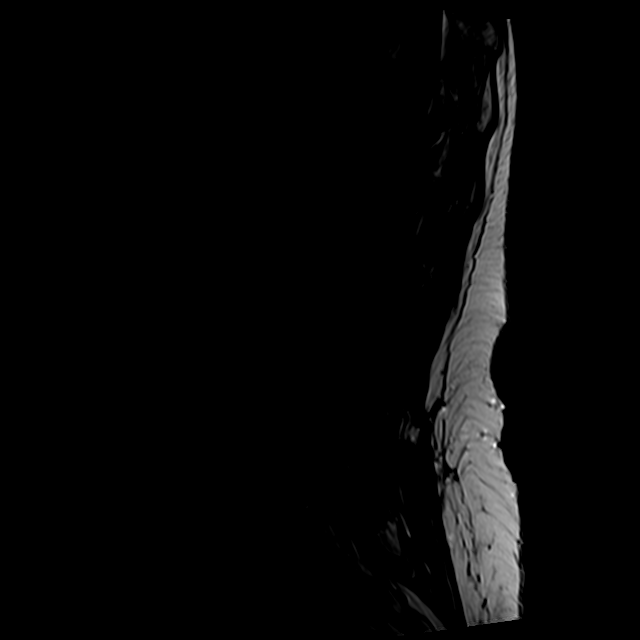
[im 12/15]
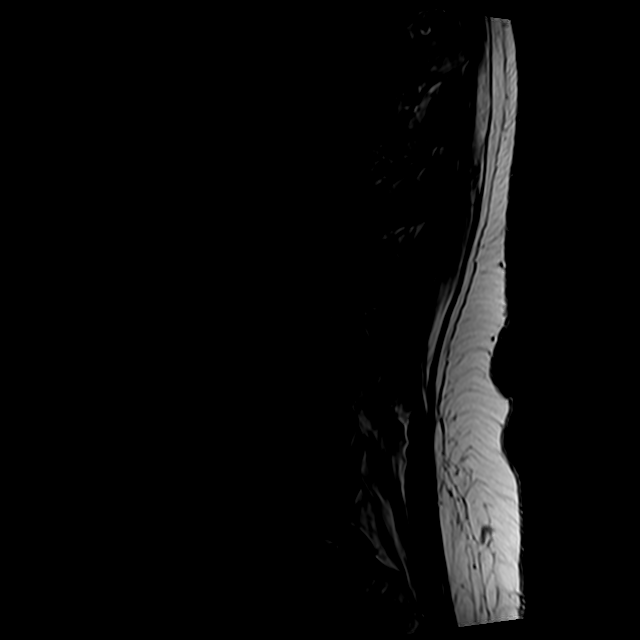
[im 15/15]
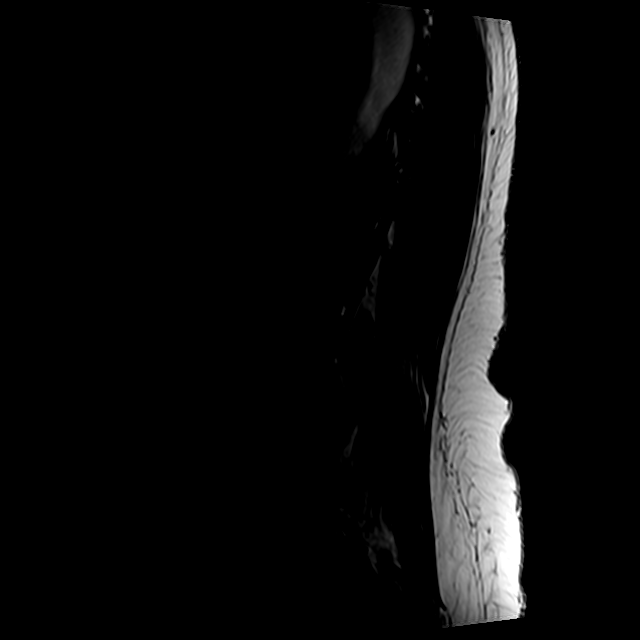

[Series 7: T2 · axial · 4.0mm · 0.78mm/px · z∈[-34,+194]mm · 9 of 38 slices shown (2 of 2)]
[im 1/38]
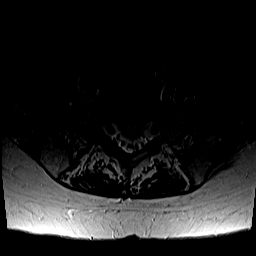
[im 6/38]
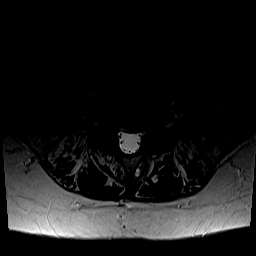
[im 11/38]
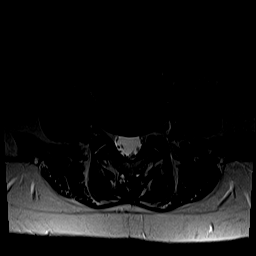
[im 16/38]
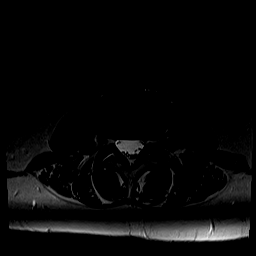
[im 19/38]
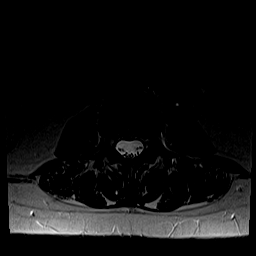
[im 22/38]
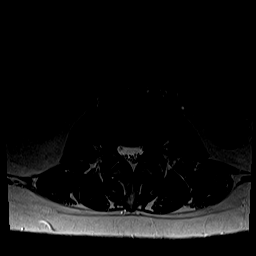
[im 27/38]
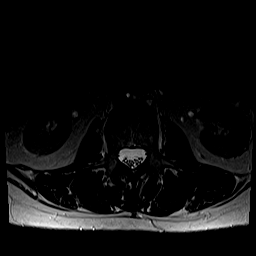
[im 32/38]
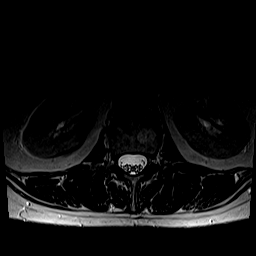
[im 38/38]
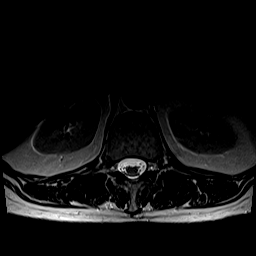

[Series 8: T1 · axial · 4.0mm · 0.39mm/px · z∈[-34,+165]mm · 4 of 38 slices shown (2 of 2)]
[im 1/38]
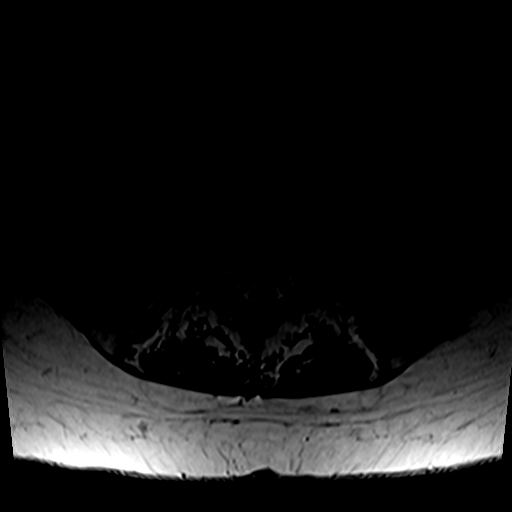
[im 6/38]
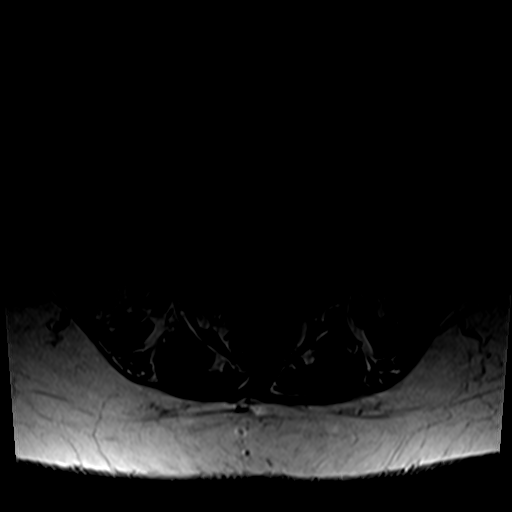
[im 19/38]
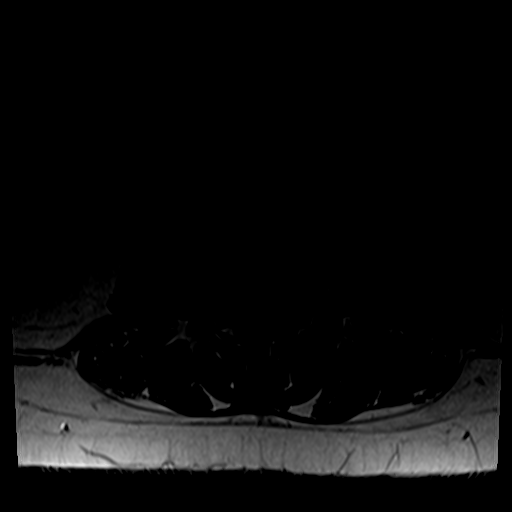
[im 32/38]
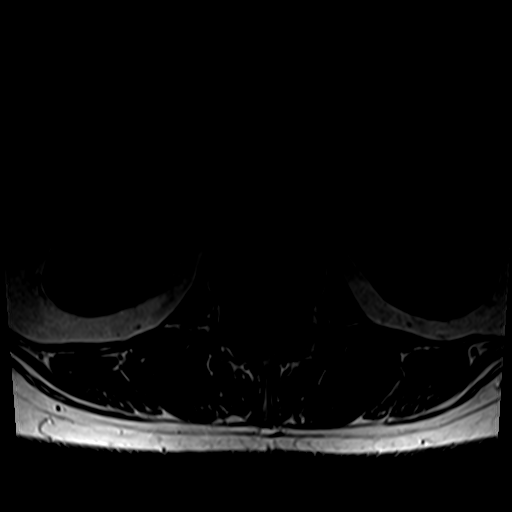

[25 of 48 positions shown; findings below may reference images not displayed]

FINDINGS: Segmentation: 5 lumbar vertebrae correlating with prior lumbar spine
radiographs.

Alignment: Trace L1-L2 grade 1 retrolisthesis. 2 mm L2-L3 grade 1
retrolisthesis. 3 mm L5-S1 grade 1 retrolisthesis.

Vertebrae: Vertebral body height is maintained. No marrow edema or
suspicious osseous lesion. Multilevel vertebral body hemangiomas.

Conus medullaris and cauda equina: Conus extends to the L1-L2 level.
No signal abnormality within the visualized distal spinal cord.

Paraspinal and other soft tissues: No abnormality identified within
included portions of the abdomen/retroperitoneum. Atrophy of the
lumbar paraspinal musculature.

Disc levels:

Levels of mild and moderate disc degeneration greatest at L1-L2,
L2-L3 and L5-S1.

T12-L1: No disc herniation. No significant canal or foraminal
stenosis.

L1-L2: Small disc bulge. No significant spinal canal stenosis or
neural foraminal narrowing.

L2-L3: Disc bulge with superimposed broad-based shallow central disc
protrusion. Mild facet arthrosis/ligamentum flavum hypertrophy.
Minimal left subarticular and central canal narrowing without frank
nerve root impingement. Mild bilateral inferior neural foraminal
narrowing (greater on the left).

L3-L4: Minimal disc bulge. Mild facet arthrosis. No significant
spinal canal stenosis or neural foraminal narrowing.

L4-L5: Disc bulge. Superimposed broad-based central disc protrusion.
Mild facet arthrosis. The disc protrusion partially effaces the
ventral thecal sac contributes to minimal bilateral subarticular
narrowing. Central canal patent. No frank nerve root impingement. No
significant neural foraminal narrowing.

L5-S1: Sequela of prior left hemilaminotomy. Disc bulge with
circumferential osteophyte ridge. Mild/moderate facet arthrosis with
mild ligamentum flavum hypertrophy. Mild bilateral subarticular
narrowing without frank nerve root impingement. Central canal
patent. Moderate right with moderate/severe left neural foraminal
narrowing (series 5, image 12).
IMPRESSION: Lumbar spondylosis as outlined and greatest at L5-S1.

At L5-S1, disc bulge with circumferential osteophyte ridge and facet
arthrosis contribute to bilateral neural foraminal narrowing
(moderate right, moderate/severe left). Correlate for left L5
radiculopathy. Mild bilateral subarticular narrowing also present at
this level.

No more than mild spinal canal or neural foraminal narrowing at the
remaining levels.

## 2019-06-29 IMAGING — DX DG LUMBAR SPINE COMPLETE 4+V
5 series · 5 of 5 positions shown · non-contrast
Comparison: [DATE].

CLINICAL DATA: Chronic left-sided low back pain without sciatica.
No injury.

EXAM:
LUMBAR SPINE - COMPLETE 4+ VIEW

[l-spine ap]
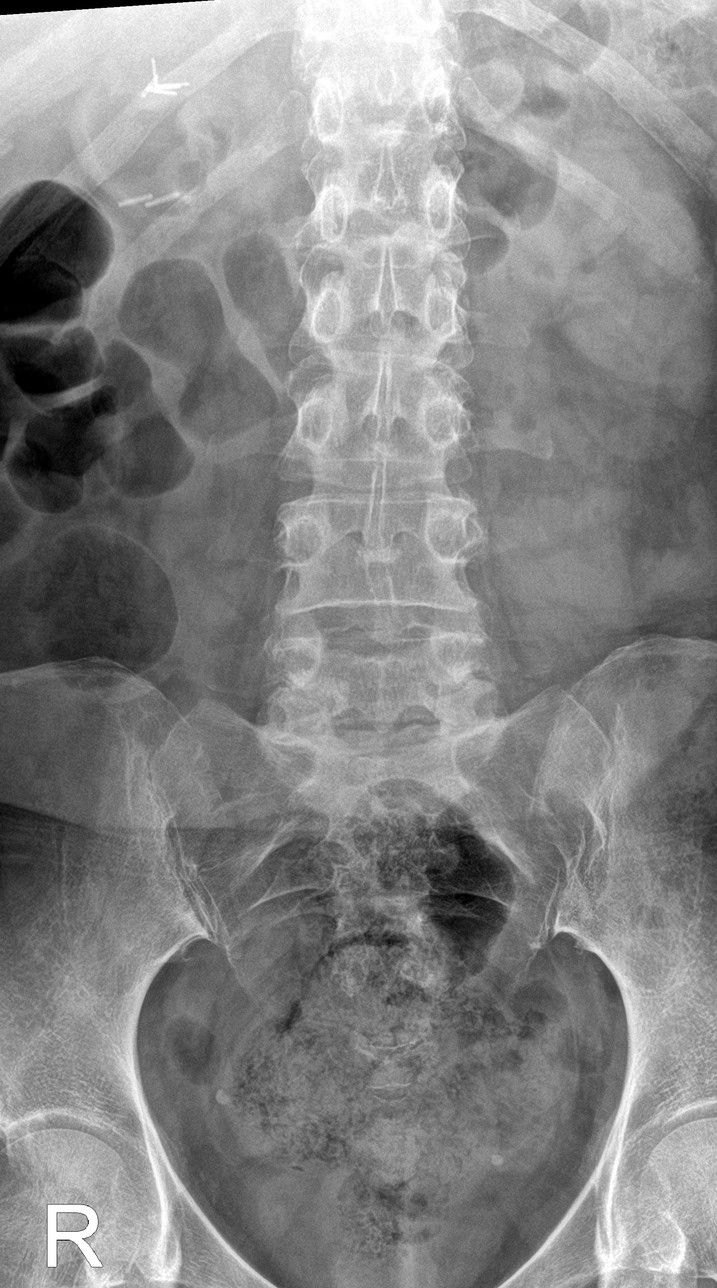

[l-spine obl (1 of 2)]
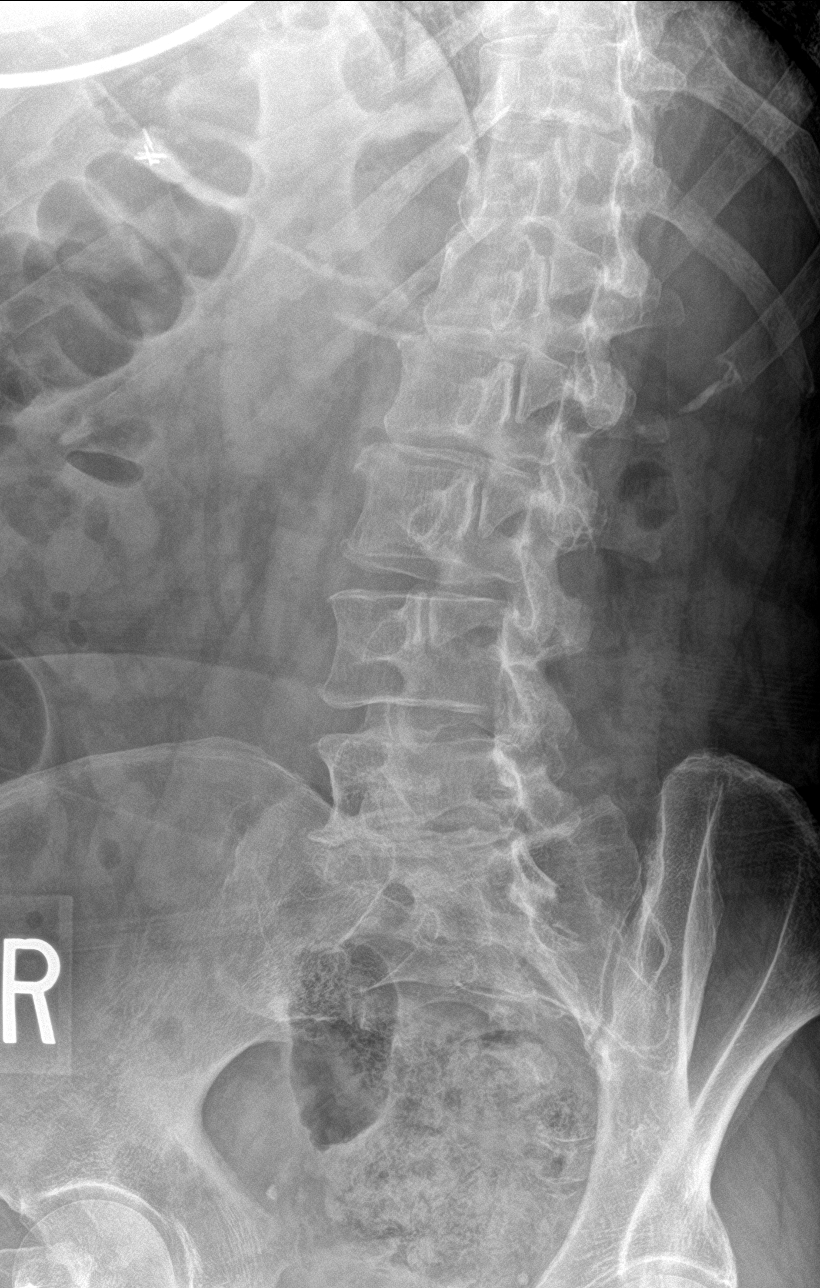

[l-spine obl (2 of 2)]
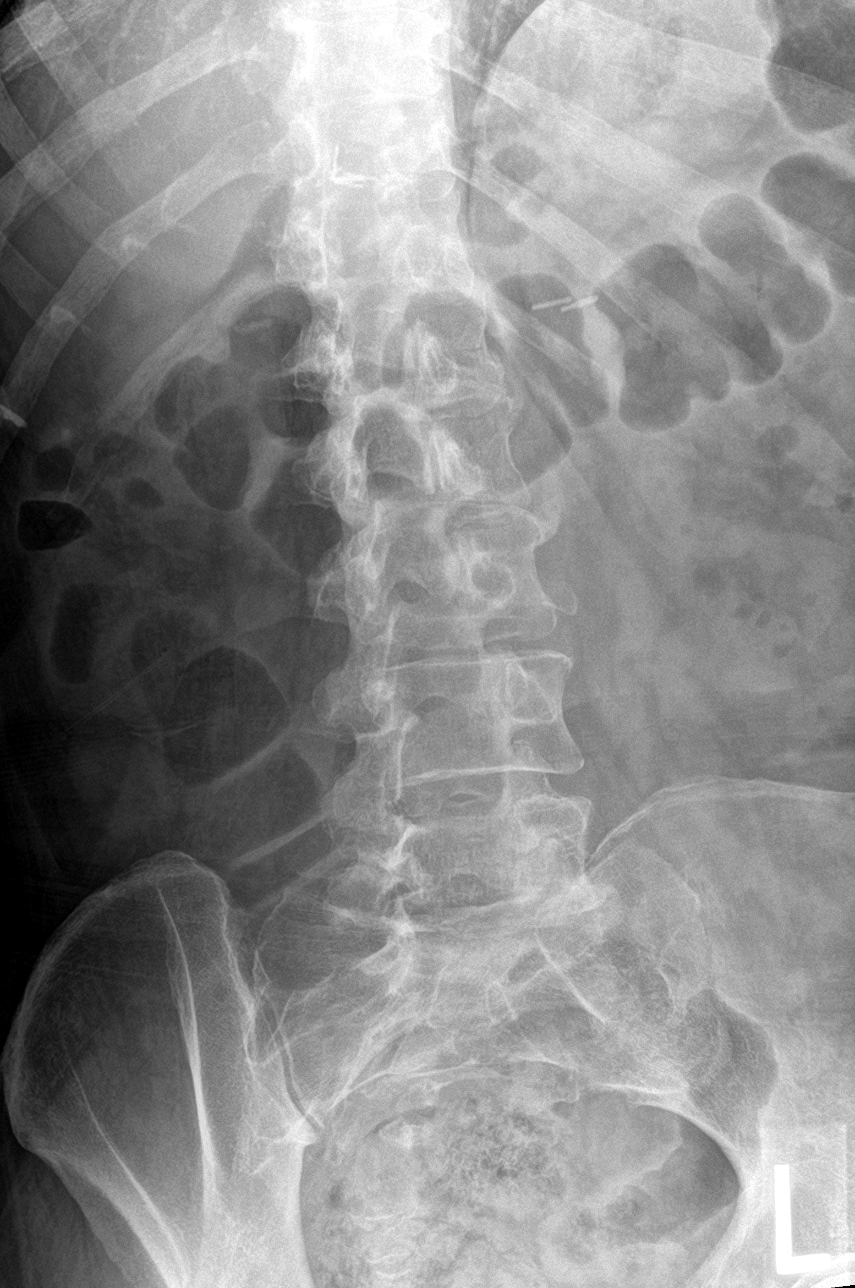

[l-spine lat]
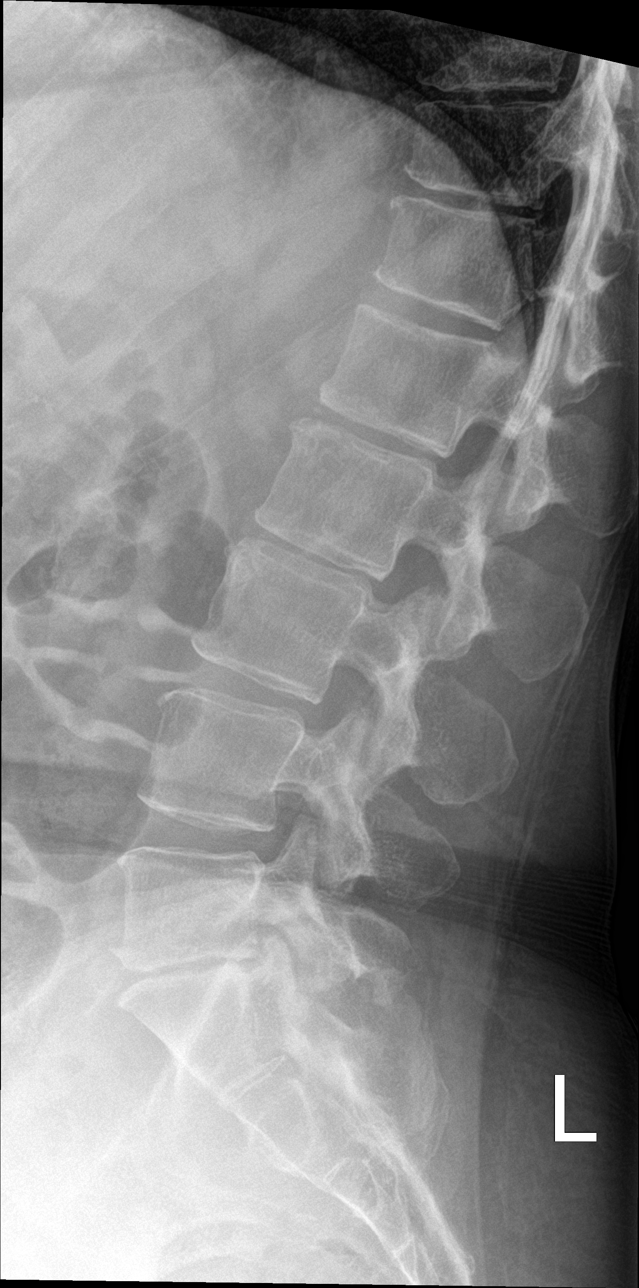

[l-spine spot]
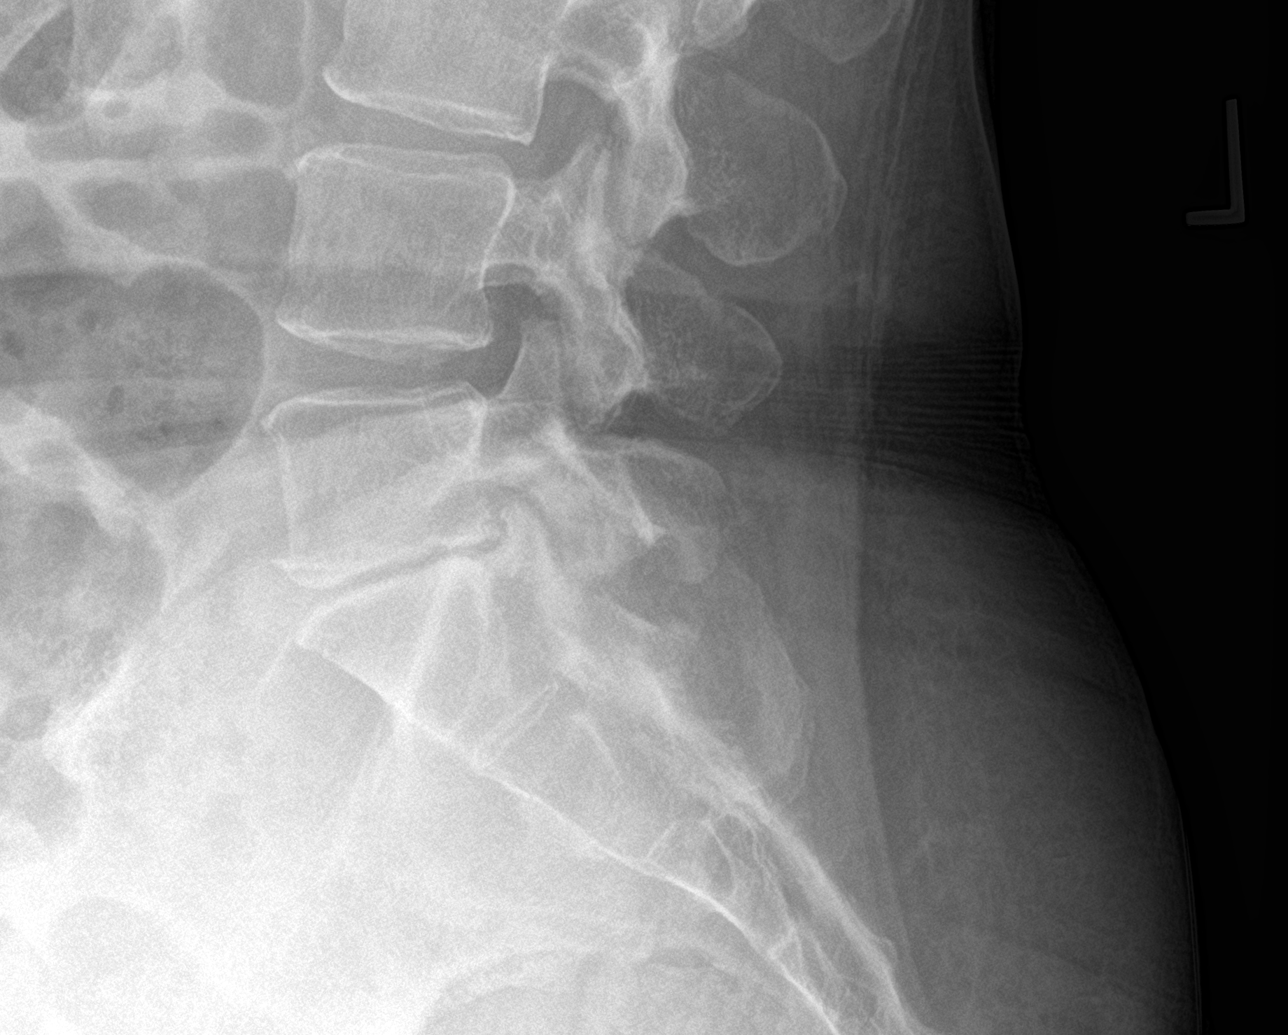

[5 of 5 positions shown; findings below may reference images not displayed]

FINDINGS: No fracture or spondylolisthesis is noted. Moderate degenerative
disc disease is noted at L1-2, L2-3 and L5-S1.
IMPRESSION: Moderate multilevel degenerative disc disease. No acute abnormality
seen in the lumbar spine.

## 2019-06-29 NOTE — Assessment & Plan Note (Signed)
Cynthia [name] returns, she bent over and stood up and had severe pain in the left side of her low back, directly over her sacroiliac joint. Better with sitting, worse with standing. Nothing overtly radicular, no bowel or bladder dysfunction, saddle numbness, constitutional symptoms. Approximately a year and a half ago I injected her left sacroiliac joint which resulted in complete resolution of her pain. We will do this again today. Adding additional formal physical therapy. Adding updated x-rays, and because she has failed over 6 weeks of conservative measures over the past year we will proceed with a lumbar spine MRI. Return to see me in 1 month.

## 2019-06-29 NOTE — Progress Notes (Signed)
    Procedures performed today:    Procedure: Real-time Ultrasound Guided injection of the left sacroiliac joint Device: Samsung HS60  Verbal informed consent obtained.  Time-out conducted.  Noted no overlying erythema, induration, or other signs of local infection.  Skin prepped in a sterile fashion.  Local anesthesia: Topical Ethyl chloride.  With sterile technique and under real time ultrasound guidance: 1 cc Kenalog 40, 2 cc lidocaine, 2 cc bupivacaine injected easily Completed without difficulty  Pain immediately resolved suggesting accurate placement of the medication.  Advised to call if fevers/chills, erythema, induration, drainage, or persistent bleeding.  Images permanently stored and available for review in the ultrasound unit.  Impression: Technically successful ultrasound guided injection.  Independent interpretation of tests performed by another provider:   None.  Impression and Recommendations:    Chronic left-sided low back pain Cynthia [name] returns, she bent over and stood up and had severe pain in the left side of her low back, directly over her sacroiliac joint. Better with sitting, worse with standing. Nothing overtly radicular, no bowel or bladder dysfunction, saddle numbness, constitutional symptoms. Approximately a year and a half ago I injected her left sacroiliac joint which resulted in complete resolution of her pain. We will do this again today. Adding additional formal physical therapy. Adding updated x-rays, and because she has failed over 6 weeks of conservative measures over the past year we will proceed with a lumbar spine MRI. Return to see me in 1 month.    ___________________________________________ Gwen Her. Dianah Field, M.D., ABFM., CAQSM. Primary Care and [place] Instructor of [place] [employer] [facility]

## 2019-07-01 ENCOUNTER — Other Ambulatory Visit: Payer: Self-pay

## 2019-07-01 ENCOUNTER — Ambulatory Visit: Payer: BC Managed Care – PPO | Attending: Sports Medicine | Admitting: Physical Therapy

## 2019-07-01 DIAGNOSIS — R262 Difficulty in walking, not elsewhere classified: Secondary | ICD-10-CM

## 2019-07-01 DIAGNOSIS — M6281 Muscle weakness (generalized): Secondary | ICD-10-CM

## 2019-07-01 DIAGNOSIS — M62830 Muscle spasm of back: Secondary | ICD-10-CM

## 2019-07-01 DIAGNOSIS — G8929 Other chronic pain: Secondary | ICD-10-CM

## 2019-07-01 DIAGNOSIS — M5442 Lumbago with sciatica, left side: Secondary | ICD-10-CM | POA: Insufficient documentation

## 2019-07-01 DIAGNOSIS — E063 Autoimmune thyroiditis: Secondary | ICD-10-CM | POA: Diagnosis not present

## 2019-07-01 NOTE — Therapy (Signed)
[facility] [address]  [place], [place], [address] Phone: [phone]   Fax:  [phone]  Physical Therapy Evaluation  Patient Details  Name: Cynthia [name] MRN: [id number] Date of Birth: 1960/12/10 Referring Provider (PT): Aundria Mems, MD   Encounter Date: 07/01/2019  PT End of Session - 07/01/19 1148    Visit Number  1    Number of Visits  12    Date for PT Re-Evaluation  08/12/19    Authorization Type  [employer] - VL: 59    Authorization - Number of Visits  60    PT Start Time  1148    PT Stop Time  1250    PT Time Calculation (min)  62 min    Activity Tolerance  Patient tolerated treatment well    Behavior During Therapy  [facility] for tasks assessed/performed       Past Medical History:  Diagnosis Date  . Acoustic neuroma ([place])   . ADD (attention deficit disorder)    and OCD-on Vyvanse  . Asthma    w/out status asthmaticus  . Awareness under anesthesia   . Balance problem    after acoustic neuroma excision  . Benign brain tumor ([place])   . Chronic back pain   . Constipation, chronic    IBS, CIC  . Diabetes mellitus   . Difficult airway   . 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 GERD (gastroesophageal reflux disease)   . Iron deficiency anemia, unspecified 02/18/2013  . Obesity    296 lbs (highest weight in 2012)   . Tendonitis, Achilles, left     Past Surgical History:  Procedure Laterality Date  . ABLATION     and bladder sling  . ACHILLES TENDON REPAIR     x2  . ANAL FISSURE REPAIR  2012  . BACK SURGERY    . CHOLECYSTECTOMY    . CRANIOTOMY  09/13/2015   with excision of acoustic neuroma  . IMPLANTATION BONE ANCHORED HEARING AID    . MOUTH SURGERY  2015   3 implants  . OVARIAN CYST REMOVAL     x4  . THYROID LOBECTOMY Right 04/28/2015   Partial   . TOE FUSION Left     There were no vitals filed for this visit.   Subjective Assessment - 07/01/19 1151    Subjective   Pt reports her back always seems to hurt on and off worse since December, however after bending over this weekend to take a leash off her dog, her back locked up and she was unable to straighten up. Initially also noted L LE sciatica/radiculopathy but received an injection with significant relief and resolution of sciatica, however notes ongoing tightness and more mild pain.    Pertinent History  chronic LBP with worsening since December 2020 and acute exacerbation 06/27/19; remote h/o lumbar surgery; craniotomy for excision of acoustic neuroma 2017 - deaf in L ear & intermittent vertigo; h/o thyroid cancer 2016 s/p thyroid lobectomy    Limitations  Standing;Walking;Lifting;House hold activities    How long can you stand comfortably?  "coulple minutes"    How long can you walk comfortably?  3-4 minutes    Diagnostic tests  Lumbar MRI 06/29/19: Multilevel lumbar spondylosis, greatest at L5-S1. At L5-S1, disc bulge with circumferential osteophyte ridge and facet arthrosis contribute to bilateral neural foraminal narrowing (moderate right, moderate/severe left). Correlate for left L5 radiculopathy. Mild bilateral subarticular narrowing also present  at this level.  No more than mild spinal canal or neural foraminal narrowing at the remaining levels.  Lumbar x-ray 06/29/19: No fracture or spondylolisthesis is noted. Moderate degenerative disc disease is noted at L1-2, L2-3 and L5-S1.    Patient Stated Goals  "to get back better so I can resume my daily activities inlcuding working out and walking more"    Currently in Pain?  Yes    Pain Score  4     Pain Location  Back    Pain Orientation  Lower;Mid    Pain Descriptors / Indicators  Aching;Tightness    Pain Type  Acute pain;Chronic pain    Pain Radiating Towards  none since injection 06/29/19 - was having pain going down L LE as of exacerbation 06/27/19    Pain Onset  In the past 7 days    Pain Frequency  Constant    Aggravating Factors   bending over, sit to  stand transition, prolonged standing    Pain Relieving Factors  ice    Effect of Pain on Daily Activities  difficulty washing and drying her hair; uncomfortable with sitting for work - has to have a pillow behind her back; difficulty reaching down to leash her dog         [facility] PT Assessment - 07/01/19 1148      Assessment   Medical Diagnosis  Chronic LBP with L sciatica    Referring Provider (PT)  Aundria Mems, MD    Onset Date/Surgical Date  06/27/19   chronic - acute exacerbation   Next MD Visit  07/30/19    Prior Therapy  April - May 2018      Precautions   Precautions  None      Balance Screen   Has the patient fallen in the past 6 months  No    How many times?  1 fall on steps - May 2020    Has the patient had a decrease in activity level because of a fear of falling?   No    Is the patient reluctant to leave their home because of a fear of falling?   No      Home Environment   Living Environment  Private residence    Living Arrangements  Spouse/significant other    Type of [place] to enter    Entrance Stairs-Number of Steps  3    Entrance Stairs-Rails  Right    Home Layout  Multi-level;Bed/bath upstairs      Prior Function   Level of Independence  Independent    Vocation  Full time employment;Works at home    [place] [employer] - all deskwork (IT)    Leisure  walk 4 miles/day      Cognition   Overall Cognitive Status  Within Functional Limits for tasks assessed      Observation/Other Assessments   Focus on Therapeutic Outcomes (FOTO)   Lumbar - 41% (59% limitation); Predicted 67% (33% limitation)      ROM / Strength   AROM / PROM / Strength  AROM;Strength      AROM   AROM Assessment Site  Lumbar    Lumbar Flexion  hands to lower thighs - has to walk hands down thighs    Lumbar Extension  75% limited    Lumbar - Right Side Bend  hand to femoral condyle    Lumbar - Left Side Bend  hand to mid  thigh    Lumbar  - Right Rotation  30% limited      Strength   Strength Assessment Site  Hip;Knee;Ankle    Right/Left Hip  Right;Left    Right Hip Flexion  4-/5    Right Hip Extension  3+/5    Right Hip External Rotation   4-/5    Right Hip Internal Rotation  4/5    Right Hip ABduction  4/5    Right Hip ADduction  3+/5    Left Hip Flexion  3+/5    Left Hip Extension  3+/5    Left Hip External Rotation  3+/5    Left Hip Internal Rotation  4-/5    Left Hip ABduction  4-/5    Left Hip ADduction  3+/5    Right/Left Knee  Right;Left    Right Knee Flexion  4-/5    Right Knee Extension  4/5    Left Knee Flexion  4/5    Left Knee Extension  4+/5    Right/Left Ankle  Right;Left    Right Ankle Dorsiflexion  4/5    Left Ankle Dorsiflexion  4/5      Flexibility   Soft Tissue Assessment /Muscle Length  yes    Hamstrings  moderately tight B    Quadriceps  mild to mod tight in hip flexors & quads    ITB  mildly tight B    Piriformis  WFL    Quadratus Lumborum  mildly tight B      Palpation   SI assessment   alignment WNL but increased pain with sacral PAs    Palpation comment  ttp in B thoracolumbar paraspinals, R>L periscapular muscles, B upper glutes & B ITB                Objective measurements completed on examination: See above findings.      [facility] Adult PT Treatment/Exercise - 07/01/19 1148      Exercises   Exercises  Lumbar      Lumbar Exercises: Stretches   Passive Hamstring Stretch  Left;30 seconds;1 rep    Passive Hamstring Stretch Limitations  supine with strap - pt to perform bilaterally for HEP    Single Knee to Chest Stretch  Right;Left;30 seconds;1 rep    Lower Trunk Rotation  20 seconds;2 reps    Hip Flexor Stretch  Left;30 seconds;1 rep    Hip Flexor Stretch Limitations  mod thomas with strap - pt to perform bilaterally for HEP    ITB Stretch  Left;30 seconds;1 rep    ITB Stretch Limitations  supine with strap - pt to perform bilaterally for HEP      Lumbar  Exercises: Supine   Pelvic Tilt  5 reps;5 seconds      Manual Therapy   Manual Therapy  Taping    Kinesiotex  Inhibit Muscle      Kinesiotix   Inhibit Muscle   B thoracolumbosacral paraspinals 30% + 30% perpendicular strip at PSIS level             PT Education - 07/01/19 1250    Education Details  PT eval findings, anticipated POC, TENS info (has home unit), kinesiotape wearing instructions, initial HEP    Person(s) Educated  Patient    Methods  Explanation;Demonstration;Handout    Comprehension  Verbalized understanding;Returned demonstration;Need further instruction       PT Short Term Goals - 07/01/19 1250      PT SHORT TERM GOAL #1   Title  Patient will be independent with initial HEP    Status  New    Target Date  07/15/19      PT SHORT TERM GOAL #2   Title  Patient will verbalize/demonstrate understanding of neutral spine posture and proper body mechanics to reduce strain on spine    Status  New    Target Date  07/15/19        PT Long Term Goals - 07/01/19 1250      PT LONG TERM GOAL #1   Title  Patient will be independent with ongoing/advanced HEP +/- gym program    Status  New    Target Date  08/12/19      PT LONG TERM GOAL #2   Title  Patient to demonstrate appropriate posture and body mechanics needed for daily activities    Status  New    Target Date  08/12/19      PT LONG TERM GOAL #3   Title  Lumbar ROM WFL w/o restriction d/t pain    Status  New    Target Date  08/12/19      PT LONG TERM GOAL #4   Title  B hip strength >/= 4+/5 w/o increased pain    Status  New    Target Date  08/12/19      PT LONG TERM GOAL #5   Title  Patient to report ability to perform ADLs, household and work-related tasks without increased pain    Status  New    Target Date  08/12/19      PT LONG TERM GOAL #6   Title  Patient to return to walking for exercise and working out w/o limitation due to back pain    Status  New    Target Date  08/12/19              Plan - 07/01/19 1250    Clinical Impression Statement  Cynthia [name] is a 59 y/o female who presents to PT with chronic LBP with acute exacerbation L sided LBP on 06/27/19 after bending over to unleash her dog at which time her back locked up and she was unable to straighten up and notes L LE sciatica. She reports longstanding intermittent back pain, worsening since December as her activity level has declined due to cold weather (was walking 3-4 miles/day prior to cold weather). Lumbar imaging revealing multilevel spondylosis, greatest at L5-S1 with disc bulge at L5-S1 L>R. She received an injection with significant relief and resolution of sciatica/lumbar radiculopathy, but notes ongoing tightness and more mild pain. Lumbar AROM limited in all directions due to pain with Gowers sign evident with return from flexion to standing. Flexibility limited bilaterally in hamstrings, ITB & hip flexors/quads with increased muscle tension and ttp also noted in B QL, thoracolumbar paraspinals, periscapular muscles (R>L), upper glutes and ITB with point tenderness over R greater trochanter and poor tolerance for R sidelying. MMT reveals mild weakness in proximal LE musculature. Pain and limited flexibility limit sit to stand transitions, sitting, standing and walking tolerance as well as ability to bend over to leash her dog. Cynthia [name] will benefit from skilled PT services to address above impairments and allow for performance of normal daily activities with decreased pain interference and allow return to working out and walking for exercise.    Personal Factors and Comorbidities  Comorbidity 3+;Past/Current Experience    Comorbidities  chronic LBP with worsening since December 2020 and acute exacerbation 06/27/19; remote h/o lumbar surgery; craniotomy for excision  of acoustic neuroma 2017 - deaf in L ear & intermittent vertigo; h/o thyroid cancer 2016 s/p thyroid lobectomy    Examination-Activity Limitations  Bathing;Bed  Mobility;Bend;Caring for Others;Carry;Dressing;Hygiene/Grooming;Lift;Locomotion Level;Reach Overhead;Sit;Squat;Stand;Stairs;Transfers    Examination-Participation Restrictions  Community Activity   working from home   Stability/Clinical Decision Making  Evolving/Moderate complexity    Clinical Decision Making  Moderate    Rehab Potential  Good    PT Frequency  2x / week    PT Duration  6 weeks    PT Treatment/Interventions  ADLs/Self Care Home Management;Cryotherapy;Electrical Stimulation;Iontophoresis 4mg /ml Dexamethasone;Moist Heat;Traction;Ultrasound;Gait training;Functional mobility training;Therapeutic activities;Therapeutic exercise;Balance training;Neuromuscular re-education;Patient/family education;Manual techniques;Passive range of motion;Dry needling;Taping;Spinal Manipulations;Joint Manipulations    PT Next Visit Plan  Review initial HEP; posture and body mechanics education; thoracolumbopelvic flexibility and strengthening (extension preference if tolerated); manual therapy and modalities PRN    PT Home Exercise Plan  07/01/19 - HS, ITB, hip flexor & SKTC stretches, LTR, pelvic tilts    Consulted and Agree with Plan of Care  Patient       Patient will benefit from skilled therapeutic intervention in order to improve the following deficits and impairments:  Decreased activity tolerance, Decreased balance, Decreased endurance, Decreased knowledge of precautions, Decreased mobility, Decreased range of motion, Decreased safety awareness, Decreased strength, Difficulty walking, Hypomobility, Increased fascial restricitons, Increased muscle spasms, Impaired perceived functional ability, Impaired flexibility, Impaired sensation, Improper body mechanics, Postural dysfunction, Pain  Visit Diagnosis: Chronic bilateral low back pain with left-sided sciatica  Muscle spasm of back  Muscle weakness (generalized)  Difficulty in walking, not elsewhere classified     Problem List Patient  Active Problem List   Diagnosis Date Noted  . Atrophic vaginitis 06/03/2019  . Family history of osteoporosis 03/05/2019  . Post-menopausal 03/05/2019  . Motion sickness 12/26/2017  . 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 Fatty liver disease, nonalcoholic [id number]  . Left renal mass 03/26/2017  . Obesity (BMI 30.0-34.9) 03/26/2017  . Chronic left-sided low back pain 09/19/2016  . 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 Hashimoto's thyroiditis 06/14/2015  . 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 Hypokalemia 02/13/2015  . 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 ADD (attention deficit disorder) 05/11/2014  . Chronic constipation 05/11/2014  . Essential (primary) hypertension 05/11/2014  . Gastro-esophageal reflux disease without esophagitis 05/11/2014  . Asthma, mild persistent 05/11/2014  . Interdigital neuralgia 04/14/2013  . Iron deficiency anemia, unspecified 02/18/2013    Percival Spanish, PT, MPT 07/01/2019, 2:07 PM  [facility] [address]  [place], [place], [address] Phone: [phone]   Fax:  [phone]  Name: Cynthia [name] MRN: [id number] Date of Birth: 12-03-1960

## 2019-07-01 NOTE — Patient Instructions (Addendum)
TENS stands for Transcutaneous Electrical Nerve Stimulation. In other words, electrical impulses are allowed to pass through the skin in order to excite a nerve.   Purpose and Use of TENS:  TENS is a method used to manage acute and chronic pain without the use of drugs. It has been effective in managing pain associated with surgery, sprains, strains, trauma, rheumatoid arthritis, and neuralgias. It is a non-addictive, low risk, and non-invasive technique used to control pain. It is not, by any means, a curative form of treatment.   How TENS Works:  Most TENS units are a Paramedic unit powered by one 9 volt battery. Attached to the outside of the unit are two lead wires where two pins and/or snaps connect on each wire. All units come with a set of four reusable pads or electrodes. These are placed on the skin surrounding the area involved. By inserting the leads into  the pads, the electricity can pass from the unit making the circuit complete.  As the intensity is turned up slowly, the electrical current enters the body from the electrodes through the skin to the surrounding nerve fibers. This triggers the release of hormones from within the body. These hormones contain pain relievers. By increasing the circulation of these hormones, the person's pain may be lessened. It is also believed that the electrical stimulation itself helps to block the pain messages being sent to the brain, thus also decreasing the body's perception of pain.   Hazards:  TENS units are NOT to be used by patients with PACEMAKERS, DEFIBRILLATORS, DIABETIC PUMPS, PREGNANT WOMEN, and patients with SEIZURE DISORDERS.  TENS units are NOT to be used over the heart, throat, brain, or spinal cord.  One of the major side effects from the TENS unit may be skin irritation. Some people may develop a rash if they are sensitive to the materials used in the electrodes or the connecting wires.   Wear the unit for up to 30-45 minutes,  3-4x/day.   Avoid overuse due the body getting used to the stem making it not as effective over time.      Kinesiology tape  What is kinesiology tape?  There are many brands of kinesiology tape. KTape, [employer], [employer], Dynamic tape, to name a few.  It is an elasticized tape designed to support the body's natural healing process. This tape provides stability and support to muscles and joints without restricting motion.  It can also help decrease swelling in the area of application.  How does it work?  The tape microscopically lifts and decompresses the skin to allow for drainage of lymph (swelling) to flow away from area, reducing inflammation. The tape has the ability to help re-educate the neuromuscular system by targeting specific receptors in the skin. The presence of the tape increases the body's awareness of posture and body mechanics.  Do not use with:  . Open wounds . Skin lesions . Adhesive allergies  In some rare cases, mild/moderate skin irritation can occur. This can include redness, itchiness, or hives. If this occurs, immediately remove tape and consult your primary care physician if symptoms are severe or do not resolve within 2 days.  Safe removal of the tape:  To remove tape safely, hold nearby skin with one hand and gentle roll tape down with other hand. You can apply oil or conditioner to tape while in shower prior to removal to loosen adhesive. DO NOT swiftly rip tape off like a band-aid, as this could cause  skin tears and additional skin irritation.     For questions, please contact your therapist at:  [facility] [address]  [place], [place], [address] Phone: [phone]   Fax:  [phone]     Home exercise program created by Annie Paras, PT.  For questions, please contact JoAnne via phone at [phone] or email at [email or web address]  [facility] [address]  [place], [place], [address] Phone: [phone]   Fax:  [phone]

## 2019-07-02 ENCOUNTER — Other Ambulatory Visit: Payer: Self-pay | Admitting: Physician Assistant

## 2019-07-02 DIAGNOSIS — E063 Autoimmune thyroiditis: Secondary | ICD-10-CM

## 2019-07-02 LAB — TSH: TSH: 2.6 mIU/L (ref 0.40–4.50)

## 2019-07-02 MED ORDER — LEVOTHYROXINE SODIUM 75 MCG PO TABS: 75.0000 ug | ORAL_TABLET | Freq: Every day | ORAL | 3 refills | Status: DC

## 2019-07-02 NOTE — Progress Notes (Signed)
Cynthia [name],   Thyroid is perfect! I [facility] year [facility].

## 2019-07-07 ENCOUNTER — Other Ambulatory Visit: Payer: Self-pay

## 2019-07-07 ENCOUNTER — Ambulatory Visit: Payer: BC Managed Care – PPO

## 2019-07-07 DIAGNOSIS — G8929 Other chronic pain: Secondary | ICD-10-CM

## 2019-07-07 DIAGNOSIS — M6281 Muscle weakness (generalized): Secondary | ICD-10-CM

## 2019-07-07 DIAGNOSIS — M62830 Muscle spasm of back: Secondary | ICD-10-CM | POA: Diagnosis not present

## 2019-07-07 DIAGNOSIS — R262 Difficulty in walking, not elsewhere classified: Secondary | ICD-10-CM

## 2019-07-07 DIAGNOSIS — M5442 Lumbago with sciatica, left side: Secondary | ICD-10-CM

## 2019-07-07 NOTE — Therapy (Signed)
[facility] [address]  [place], [place], [address] Phone: [phone]   Fax:  [phone]  Physical Therapy Treatment  Patient Details  Name: Verma Jasso MRN: [id number] Date of Birth: 1960/07/24 Referring Provider (PT): Aundria Mems, MD   Encounter Date: 07/07/2019  PT End of Session - 07/07/19 1722    Visit Number  2    Number of Visits  12    Date for PT Re-Evaluation  08/12/19    Authorization Type  BC[employer] - VL: 60    Authorization - Number of Visits  3    PT Start Time  [id number]    PT Stop Time  1751    PT Time Calculation (min)  47 min    Activity Tolerance  Patient tolerated treatment well    Behavior During Therapy  [facility] for tasks assessed/performed       Past Medical History:  Diagnosis Date  . Acoustic neuroma ([place])   . ADD (attention deficit disorder)    and OCD-on Vyvanse  . Asthma    w/out status asthmaticus  . Awareness under anesthesia   . Balance problem    after acoustic neuroma excision  . Benign brain tumor ([place])   . Chronic back pain   . Constipation, chronic    IBS, CIC  . Diabetes mellitus   . Difficult airway   . 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 GERD (gastroesophageal reflux disease)   . Iron deficiency anemia, unspecified 02/18/2013  . Obesity    296 lbs (highest weight in 2012)   . Tendonitis, Achilles, left     Past Surgical History:  Procedure Laterality Date  . ABLATION     and bladder sling  . ACHILLES TENDON REPAIR     x2  . ANAL FISSURE REPAIR  2012  . BACK SURGERY    . CHOLECYSTECTOMY    . CRANIOTOMY  09/13/2015   with excision of acoustic neuroma  . IMPLANTATION BONE ANCHORED HEARING AID    . MOUTH SURGERY  2015   3 implants  . OVARIAN CYST REMOVAL     x4  . THYROID LOBECTOMY Right 04/28/2015   Partial   . TOE FUSION Left     There were no vitals filed for this visit.  Subjective Assessment - 07/07/19 1710    Subjective   Pt. noting she received steriod shot on 06/29/19 which provided some relief from back pain.    Pertinent History  chronic LBP with worsening since December 2020 and acute exacerbation 06/27/19; remote h/o lumbar surgery; craniotomy for excision of acoustic neuroma 2017 - deaf in L ear & intermittent vertigo; h/o thyroid cancer 2016 s/p thyroid lobectomy    Diagnostic tests  Lumbar MRI 06/29/19: Multilevel lumbar spondylosis, greatest at L5-S1. At L5-S1, disc bulge with circumferential osteophyte ridge and facet arthrosis contribute to bilateral neural foraminal narrowing (moderate right, moderate/severe left). Correlate for left L5 radiculopathy. Mild bilateral subarticular narrowing also present at this level.  No more than mild spinal canal or neural foraminal narrowing at the remaining levels.  Lumbar x-ray 06/29/19: No fracture or spondylolisthesis is noted. Moderate degenerative disc disease is noted at L1-2, L2-3 and L5-S1.    Patient Stated Goals  "to get back better so I can resume my daily activities inlcuding working out and walking more"    Currently in Pain?  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 provided desk posture handout (see pt. education) and pt. verbalizing that she will check her desk setup at home to reduce lumbar strain     Other Self-Care Comments   Discussed proper electrode placement with use of home TENS unit as pt. unsure regarding need for "criss cross" electrode channel pattern  for maximal pain relief       Lumbar Exercises: Stretches   Passive Hamstring Stretch  Left;30 seconds;1 rep    Passive Hamstring Stretch Limitations  supine with strap     Single Knee to Chest Stretch  Right;Left;30 seconds;1 rep    Single Knee to Chest Stretch Limitations  opp LE straight     Lower Trunk Rotation Limitations  5" x 10 reps     ITB Stretch  Left;Right;1 rep;30 seconds   Heavy cueing required for UE support of LE with strap    ITB Stretch Limitations  supine with strap       Lumbar Exercises: Aerobic   Nustep  Lvl 4, 6 min (LE only)      Lumbar Exercises: Supine   Pelvic Tilt  10 reps;5 seconds    Pelvic Tilt Limitations  good pelvic tilt motion; cues provided not to bridge hips       Manual Therapy   Manual Therapy  Taping    Kinesiotex  Inhibit Muscle      Kinesiotix   Inhibit Muscle   B thoracolumbosacral paraspinals 30% + 30% perpendicular strip at PSIS level             PT Education - 07/07/19 1738    Education Details  Instruction in proper desk posture;  instruction in proper electrode placement with home TENS unit    Person(s) Educated  Patient    Methods  Explanation;Demonstration;Handout    Comprehension  Verbalized understanding;Returned demonstration;Verbal cues required       PT Short Term Goals - 07/07/19 1730      PT SHORT TERM GOAL #1   Title  Patient will be independent with initial HEP    Status  On-going    Target Date  07/15/19      PT SHORT TERM GOAL #2   Title  Patient will verbalize/demonstrate understanding of neutral spine posture and proper body mechanics to reduce strain on spine    Status  On-going    Target Date  07/15/19        PT Long Term Goals - 07/07/19 1804      PT LONG TERM GOAL #1   Title  Patient will be independent with ongoing/advanced HEP +/- gym program    Status  On-going      PT LONG TERM GOAL #2   Title  Patient to demonstrate appropriate posture and body mechanics needed for daily  activities     Status  On-going      PT LONG TERM GOAL #3   Title  Lumbar ROM WFL w/o restriction d/t pain    Status  On-going      PT LONG TERM GOAL #4   Title  B hip strength >/= 4+/5 w/o increased pain    Status  On-going      PT LONG TERM GOAL #5   Title  Patient to report ability to perform ADLs, household and work-related tasks without increased pain    Status  On-going      PT LONG TERM GOAL #6   Title  Patient to return to walking for exercise and working out w/o limitation due to back pain    Status  On-going            Plan - 07/07/19 1802    Clinical Impression Statement  Heartly reporting she has been performing HEP daily.  Did require cueing to avoid bridging with pelvic tilt however was able to demo proper technique following instruction.  Did have to cue pt. to support LE with UE/strap with supine ITB stretch with improved stretch sensation felt with this adjustment.  Initiated instruction in desk posture/positioning to reduce lumbar strain.  Pt. 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 verbalizing she plans to adjust this element of her desk positioning at home.  Will plan to continue posture and body mechanics instruction with household tasks to reduce lumbar strain in coming sessions.     Comorbidities  chronic LBP with worsening since December 2020 and acute exacerbation 06/27/19; remote h/o lumbar surgery; craniotomy for excision of acoustic neuroma 2017 - deaf in L ear & intermittent vertigo; h/o thyroid cancer 2016 s/p thyroid lobectomy    Rehab Potential  Good    PT Treatment/Interventions  ADLs/Self Care Home Management;Cryotherapy;Electrical Stimulation;Iontophoresis 4mg /ml Dexamethasone;Moist Heat;Traction;Ultrasound;Gait training;Functional mobility training;Therapeutic activities;Therapeutic exercise;Balance training;Neuromuscular  re-education;Patient/family education;Manual techniques;Passive range of motion;Dry needling;Taping;Spinal Manipulations;Joint Manipulations    PT Next Visit Plan  Posture and body mechanics education; thoracolumbopelvic flexibility and strengthening (extension preference if tolerated); manual therapy and modalities PRN    PT Home Exercise Plan  07/01/19 - HS, ITB, hip flexor & SKTC stretches, LTR, pelvic tilts    Consulted and Agree with Plan of Care  Patient       Patient will benefit from skilled therapeutic intervention in order to improve the following deficits and impairments:  Decreased activity tolerance, Decreased balance, Decreased endurance, Decreased knowledge of precautions, Decreased mobility, Decreased range of motion, Decreased safety awareness, Decreased strength, Difficulty walking, Hypomobility, Increased fascial restricitons, Increased muscle spasms, Impaired perceived functional ability, Impaired flexibility, Impaired sensation, Improper body mechanics, Postural dysfunction, Pain  Visit Diagnosis: Chronic bilateral low back pain with left-sided sciatica  Muscle spasm of back  Muscle weakness (generalized)  Difficulty in walking, not elsewhere classified     Problem List Patient Active Problem List   Diagnosis Date Noted  . Atrophic vaginitis 06/03/2019  . Family history of osteoporosis 03/05/2019  . Post-menopausal 03/05/2019  . Motion sickness 12/26/2017  . 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 Fatty liver disease, nonalcoholic 123456  . Left renal mass 03/26/2017  . Obesity (BMI 30.0-34.9) 03/26/2017  . Chronic left-sided low back pain 09/19/2016  . 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 Hashimoto's thyroiditis  06/14/2015  . 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 Hypokalemia 02/13/2015  . 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 ADD (attention deficit disorder)  05/11/2014  . Chronic constipation 05/11/2014  . Essential (primary) hypertension 05/11/2014  . Gastro-esophageal reflux disease without esophagitis 05/11/2014  . Asthma, mild persistent 05/11/2014  . Interdigital neuralgia 04/14/2013  . Iron deficiency anemia, unspecified 02/18/2013    Bess Harvest, PTA 07/07/19 6:04 PM   [facility] [address]  [place], [place], [address] Phone: [phone]   Fax:  [phone]  Name: Wandalee Spadea MRN: [id number] Date of Birth: 01-03-1961

## 2019-07-08 ENCOUNTER — Ambulatory Visit (INDEPENDENT_AMBULATORY_CARE_PROVIDER_SITE_OTHER): Payer: BC Managed Care – PPO

## 2019-07-08 DIAGNOSIS — Z1239 Encounter for other screening for malignant neoplasm of breast: Secondary | ICD-10-CM | POA: Diagnosis not present

## 2019-07-08 DIAGNOSIS — Z1231 Encounter for screening mammogram for malignant neoplasm of breast: Secondary | ICD-10-CM | POA: Diagnosis not present

## 2019-07-08 DIAGNOSIS — Z780 Asymptomatic menopausal state: Secondary | ICD-10-CM

## 2019-07-08 IMAGING — MG DIGITAL SCREENING BILAT W/ TOMO W/ CAD
6 of 12 series · 6 of 36 positions shown · non-contrast
Comparison: Previous exam(s).

CLINICAL DATA: Screening.

EXAM:
DIGITAL SCREENING BILATERAL MAMMOGRAM WITH TOMO AND CAD

[R XCCL synth-2D]
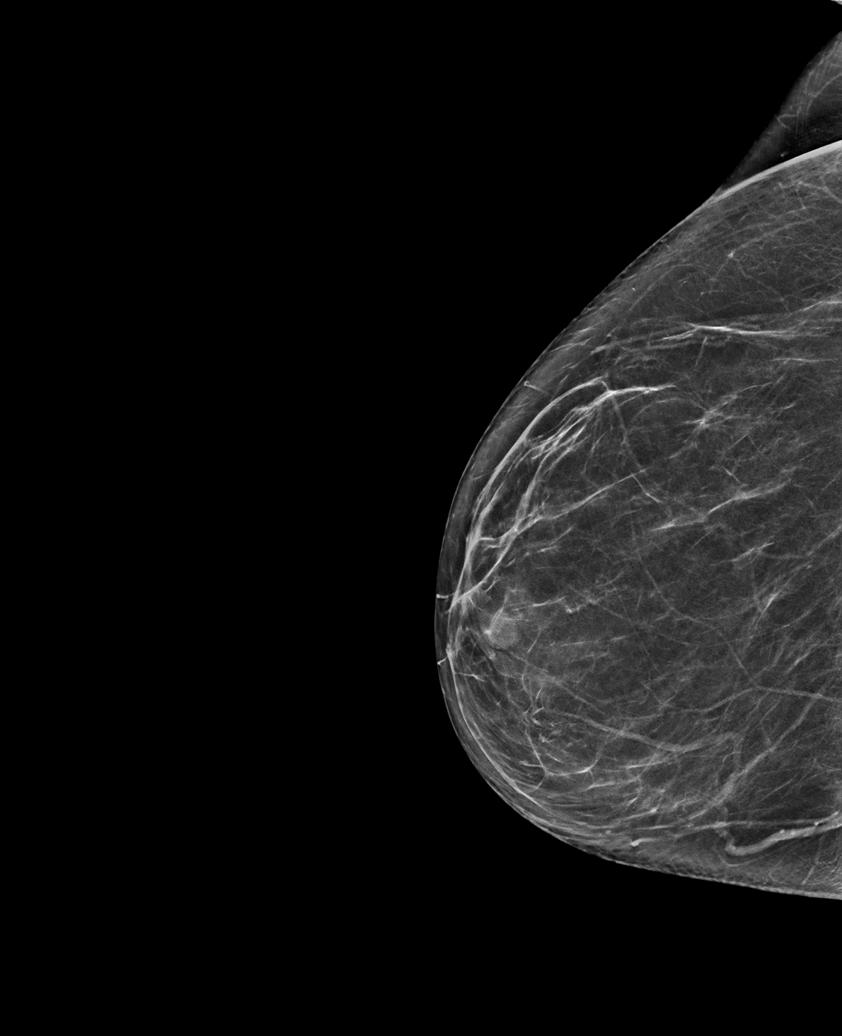

[L MLO synth-2D]
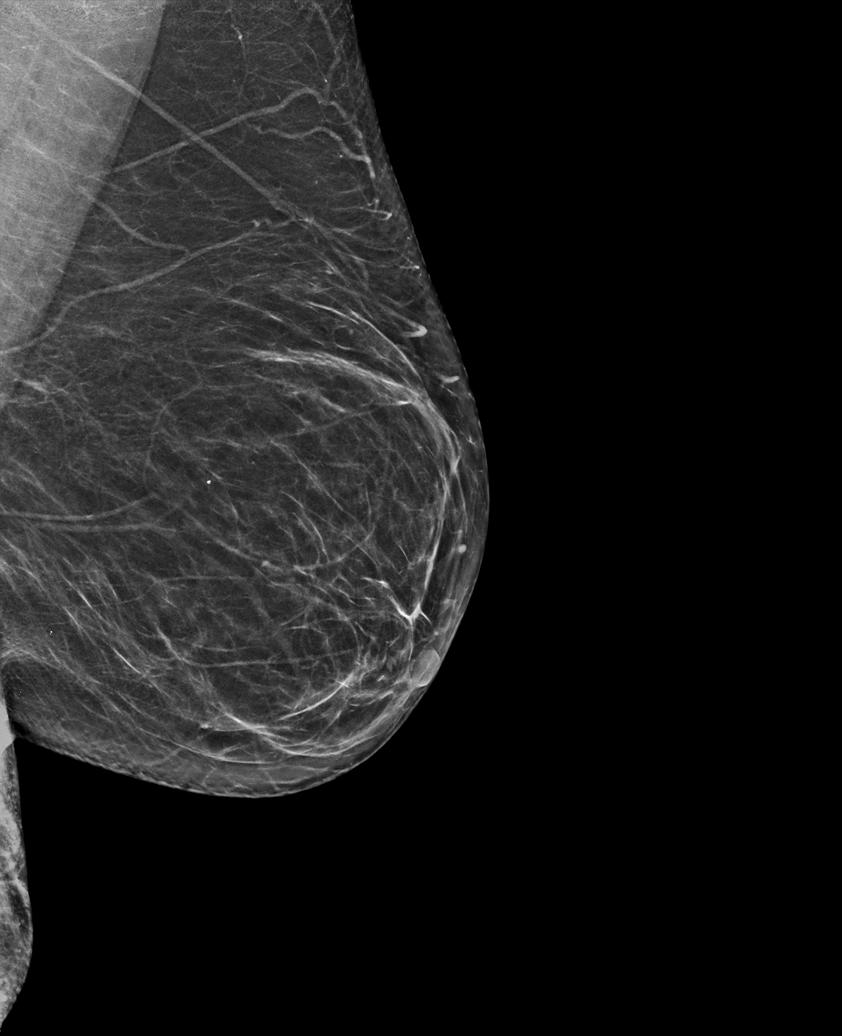

[R CC synth-2D]
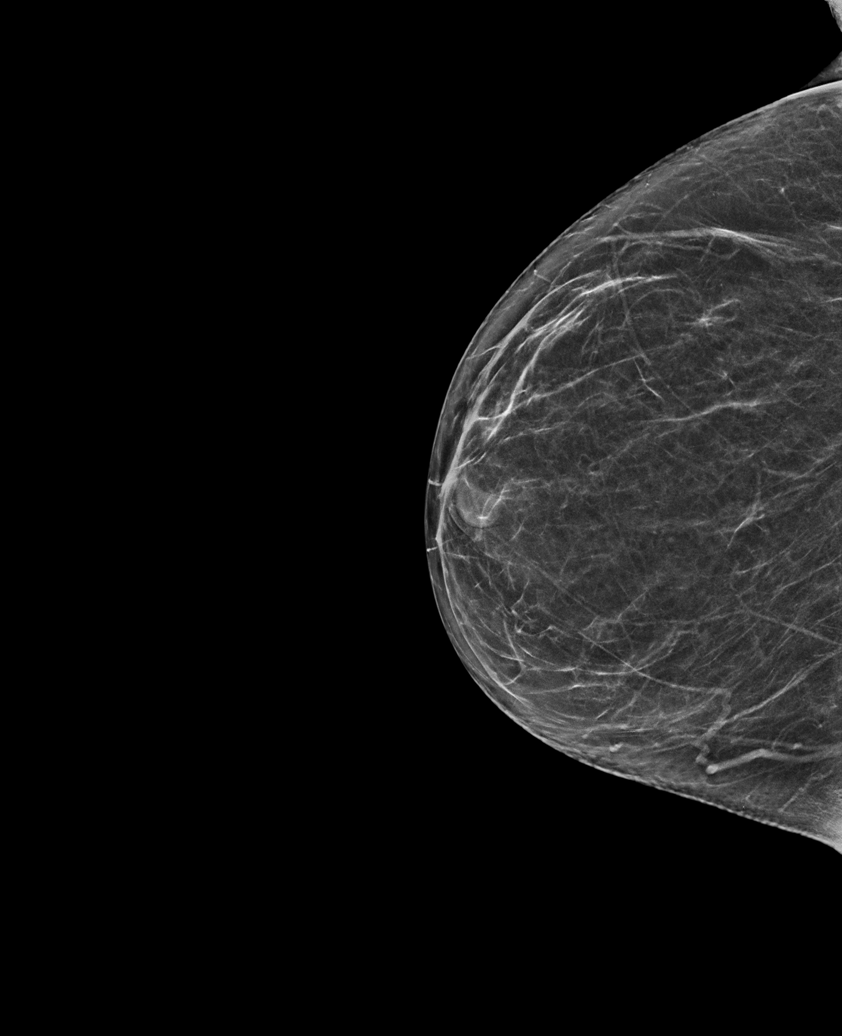

[R MLO synth-2D (1 of 2)]
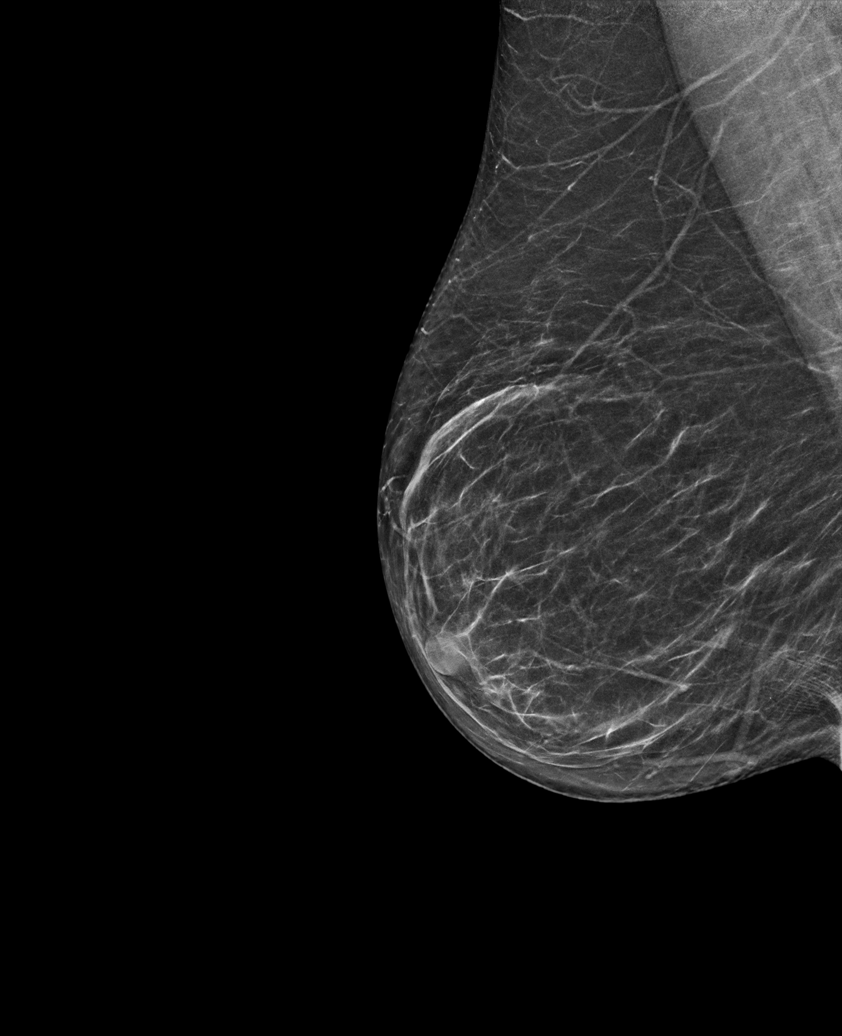

[R MLO synth-2D (2 of 2)]
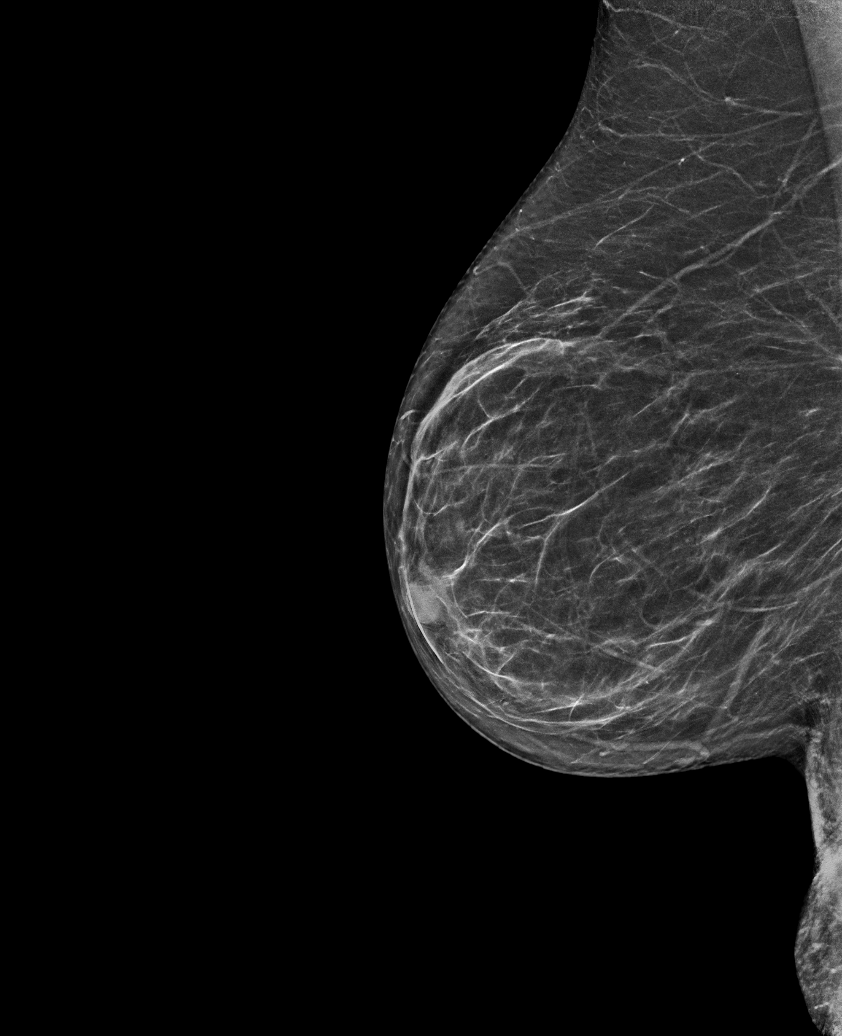

[L CC synth-2D]
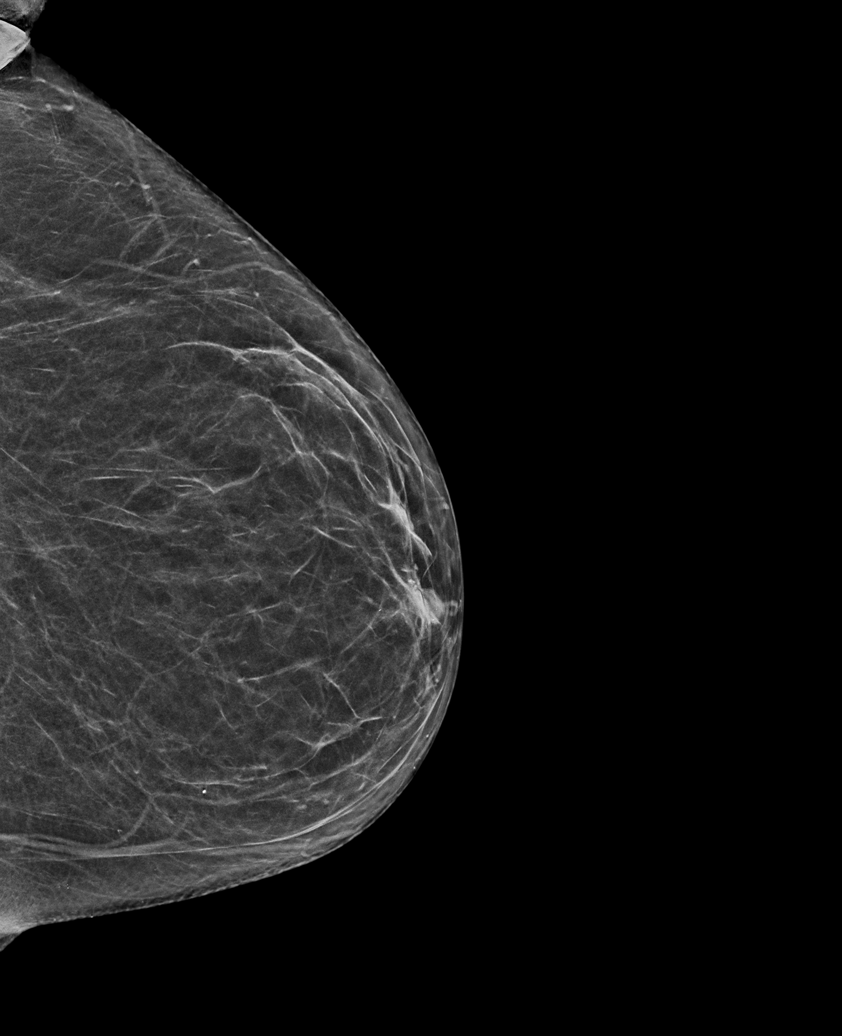

[6 of 36 positions shown; findings below may reference images not displayed]

ACR Breast Density Category b: There are scattered areas of
fibroglandular density.
FINDINGS: There are no findings suspicious for malignancy. Images were
processed with CAD.
IMPRESSION: No mammographic evidence of malignancy. A result letter of this
screening mammogram will be mailed directly to the patient.

RECOMMENDATION:
Screening mammogram in one year. (Code:[TQ])

BI-RADS CATEGORY  1: Negative.

## 2019-07-08 NOTE — Progress Notes (Signed)
Normal bone density. Recheck in 2 years. Continue vitamin D and calcium

## 2019-07-09 ENCOUNTER — Other Ambulatory Visit: Payer: Self-pay

## 2019-07-09 ENCOUNTER — Ambulatory Visit: Payer: BC Managed Care – PPO

## 2019-07-09 DIAGNOSIS — G8929 Other chronic pain: Secondary | ICD-10-CM

## 2019-07-09 DIAGNOSIS — M62830 Muscle spasm of back: Secondary | ICD-10-CM

## 2019-07-09 DIAGNOSIS — R262 Difficulty in walking, not elsewhere classified: Secondary | ICD-10-CM

## 2019-07-09 DIAGNOSIS — M5442 Lumbago with sciatica, left side: Secondary | ICD-10-CM | POA: Diagnosis not present

## 2019-07-09 DIAGNOSIS — M6281 Muscle weakness (generalized): Secondary | ICD-10-CM | POA: Diagnosis not present

## 2019-07-09 NOTE — Progress Notes (Deleted)
59 y.o. G0P0000 Married {Race/ethnicity:17218} female here for annual exam.    PCP:     No LMP recorded. Patient has had an ablation.           Sexually active: {yes no:314532}  The current method of family planning is {contraception:315051}.    Exercising: {yes no:314532}  {types:19826} Smoker:  {YES NO:22349}  Health Maintenance: Pap:  *** History of abnormal Pap:  {YES NO:22349} MMG:  *** Colonoscopy:  *** BMD:   ***  Result  *** TDaP:  *** Gardasil:   {YES NO:22349} HIV: Hep C: Screening Labs:  Hb today: ***, Urine today: ***   reports that she has never smoked. She has never used smokeless tobacco. She reports current alcohol use of about 1.0 - 2.0 standard drinks of alcohol per week. She reports that she does not use drugs.  Past Medical History:  Diagnosis Date  . Acoustic neuroma ([place])   . ADD (attention deficit disorder)    and OCD-on Vyvanse  . Asthma    w/out status asthmaticus  . Awareness under anesthesia   . Balance problem    after acoustic neuroma excision  . Benign brain tumor ([place])   . Chronic back pain   . Constipation, chronic    IBS, CIC  . Diabetes mellitus   . Difficult airway   . 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 GERD (gastroesophageal reflux disease)   . Iron deficiency anemia, unspecified 02/18/2013  . Obesity    296 lbs (highest weight in 2012)   . Tendonitis, Achilles, left     Past Surgical History:  Procedure Laterality Date  . ABLATION     and bladder sling  . ACHILLES TENDON REPAIR     x2  . ANAL FISSURE REPAIR  2012  . BACK SURGERY    . CHOLECYSTECTOMY    . CRANIOTOMY  09/13/2015   with excision of acoustic neuroma  . IMPLANTATION BONE ANCHORED HEARING AID    . MOUTH SURGERY  2015   3 implants  . OVARIAN CYST REMOVAL     x4  . THYROID LOBECTOMY Right 04/28/2015   Partial   . TOE FUSION Left     Current Outpatient Medications  Medication Sig Dispense Refill  . amphetamine-dextroamphetamine  (ADDERALL XR) 30 MG 24 hr capsule Take 1 capsule (30 mg total) by mouth every morning. 30 capsule 0  . [START ON 08/02/2019] amphetamine-dextroamphetamine (ADDERALL XR) 30 MG 24 hr capsule Take 1 capsule (30 mg total) by mouth every morning. 30 capsule 0  . amphetamine-dextroamphetamine (ADDERALL XR) 30 MG 24 hr capsule Take 1 capsule (30 mg total) by mouth every morning. 30 capsule 0  . buPROPion (WELLBUTRIN XL) 300 MG 24 hr tablet TAKE 1 TABLET DAILY 90 tablet 1  . celecoxib (CELEBREX) 200 MG capsule Take 1 capsule (200 mg total) by mouth 2 (two) times daily. 180 capsule 3  . cyclobenzaprine (FLEXERIL) 10 MG tablet TAKE 1 TABLET AT BEDTIME 90 tablet 3  . dexlansoprazole (DEXILANT) 60 MG capsule Take 60 mg by mouth daily.    [name] Kitchen docusate sodium (COLACE) 50 MG capsule Take 100 mg by mouth at bedtime.    [name] Kitchen estradiol (ESTRACE) 0.1 MG/GM vaginal cream 1 gram vaginally twice weekly 42.5 g 6  . levothyroxine (SYNTHROID) 75 MCG tablet Take 1 tablet (75 mcg total) by mouth daily. 90 tablet 3  . LINZESS 290 MCG CAPS capsule Take 1 capsule by mouth daily.    [name] Kitchen  triamterene-hydrochlorothiazide (MAXZIDE-25) 37.5-25 MG tablet TAKE 1 TABLET DAILY 90 tablet 3  . valACYclovir (VALTREX) 1000 MG tablet TAKE 1 TAB TWICE A DAY FOR 3 DAYS THEN DAILY (Patient taking differently: 1,000 mg as needed. TAKE 1 TAB TWICE A DAY FOR 3 DAYS THEN DAILY) 30 tablet 1  . VENTOLIN HFA 108 (90 Base) MCG/ACT inhaler TAKE 2 PUFFS BY MOUTH EVERY 6 HOURS AS NEEDED FOR WHEEZE OR SHORTNESS OF BREATH 18 g 2   No current facility-administered medications for this visit.    Family History  Problem Relation Age of Onset  . Thyroid disease Mother   . Heart Problems Mother        vavle issue  . Dystonia Mother   . Hypercholesterolemia Mother   . Heart attack Father   . Cancer Father        esophageal  . Heart disease Father   . Lung cancer Paternal Grandfather   . Heart disease Paternal Grandfather   . Heart disease Maternal Grandfather    . Heart disease Paternal Grandmother   . Heart disease Other        paternal and maternal side  . Cancer Brother        esophageal/stomach cancer    Review of Systems  Exam:   There were no vitals taken for this visit.    General appearance: alert, cooperative and appears stated age Head: normocephalic, without obvious abnormality, atraumatic Neck: no adenopathy, supple, symmetrical, trachea midline and thyroid normal to inspection and palpation Lungs: clear to auscultation bilaterally Breasts: normal appearance, no masses or tenderness, No nipple retraction or dimpling, No nipple discharge or bleeding, No axillary adenopathy Heart: regular rate and rhythm Abdomen: soft, non-tender; no masses, no organomegaly Extremities: extremities normal, atraumatic, no cyanosis or edema Skin: skin color, texture, turgor normal. No rashes or lesions Lymph nodes: cervical, supraclavicular, and axillary nodes normal. Neurologic: grossly normal  Pelvic: External genitalia:  no lesions              No abnormal inguinal nodes palpated.              Urethra:  normal appearing urethra with no masses, tenderness or lesions              Bartholins and Skenes: normal                 Vagina: normal appearing vagina with normal color and discharge, no lesions              Cervix: no lesions              Pap taken: {yes no:314532} Bimanual Exam:  Uterus:  normal size, contour, position, consistency, mobility, non-tender              Adnexa: no mass, fullness, tenderness              Rectal exam: {yes no:314532}.  Confirms.              Anus:  normal sphincter tone, no lesions  Chaperone was present for exam.  Assessment:   Well woman visit with normal exam.   Plan: Mammogram screening discussed. Self breast awareness reviewed. Pap and HR HPV as above. Guidelines for Calcium, Vitamin D, regular exercise program including cardiovascular and weight bearing exercise.   Follow up annually and prn.    Additional counseling given.  {yes [id number]. _______ minutes face to face time of which over 50% was spent in counseling.  After visit summary provided.

## 2019-07-09 NOTE — Therapy (Signed)
[facility] [address]  [place], [place], [address] Phone: [phone]   Fax:  [phone]  Physical Therapy Treatment  Patient Details  Name: Cynthia [name] MRN: [id number] Date of Birth: 01-18-1961 Referring Provider (PT): Aundria Mems, MD   Encounter Date: 07/09/2019  PT End of Session - 07/09/19 1709    Visit Number  3    Number of Visits  12    Date for PT Re-Evaluation  08/12/19    Authorization Type  BC[employer] - VL: 11    Authorization - Number of Visits  60    PT Start Time  1701    PT Stop Time  1740    PT Time Calculation (min)  39 min    Activity Tolerance  Patient tolerated treatment well    Behavior During Therapy  [facility] for tasks assessed/performed       Past Medical History:  Diagnosis Date  . Acoustic neuroma ([place])   . ADD (attention deficit disorder)    and OCD-on Vyvanse  . Asthma    w/out status asthmaticus  . Awareness under anesthesia   . Balance problem    after acoustic neuroma excision  . Benign brain tumor ([place])   . Chronic back pain   . Constipation, chronic    IBS, CIC  . Diabetes mellitus   . Difficult airway   . 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 GERD (gastroesophageal reflux disease)   . Iron deficiency anemia, unspecified 02/18/2013  . Obesity    296 lbs (highest weight in 2012)   . Tendonitis, Achilles, left     Past Surgical History:  Procedure Laterality Date  . ABLATION     and bladder sling  . ACHILLES TENDON REPAIR     x2  . ANAL FISSURE REPAIR  2012  . BACK SURGERY    . CHOLECYSTECTOMY    . CRANIOTOMY  09/13/2015   with excision of acoustic neuroma  . IMPLANTATION BONE ANCHORED HEARING AID    . MOUTH SURGERY  2015   3 implants  . OVARIAN CYST REMOVAL     x4  . THYROID LOBECTOMY Right 04/28/2015   Partial   . TOE FUSION Left     There were no vitals filed for this visit.  Subjective Assessment - 07/09/19 1709    Subjective   Pt. noting LBP has been pretty good over the past two days.    Pertinent History  chronic LBP with worsening since December 2020 and acute exacerbation 06/27/19; remote h/o lumbar surgery; craniotomy for excision of acoustic neuroma 2017 - deaf in L ear & intermittent vertigo; h/o thyroid cancer 2016 s/p thyroid lobectomy    Diagnostic tests  Lumbar MRI 06/29/19: Multilevel lumbar spondylosis, greatest at L5-S1. At L5-S1, disc bulge with circumferential osteophyte ridge and facet arthrosis contribute to bilateral neural foraminal narrowing (moderate right, moderate/severe left). Correlate for left L5 radiculopathy. Mild bilateral subarticular narrowing also present at this level.  No more than mild spinal canal or neural foraminal narrowing at the remaining levels.  Lumbar x-ray 06/29/19: No fracture or spondylolisthesis is noted. Moderate degenerative disc disease is noted at L1-2, L2-3 and L5-S1.    Patient Stated Goals  "to get back better so I can resume my daily activities inlcuding working out and walking more"    Currently in Pain?  No/denies    Pain Score  0-No pain   up to 1/10  at most yesterday   Pain Location  Back    Pain Orientation  Lower;Mid;Right;Left    Pain Descriptors / Indicators  Aching;Tightness    Pain Type  Acute pain;Chronic pain    Pain Radiating Towards  denies today    Pain Frequency  Intermittent    Multiple Pain Sites  No                       [facility] Adult PT Treatment/Exercise - 07/09/19 0001      Self-Care   Self-Care  Other Self-Care Comments    Posture  Further reviewed pt. desk setup/posture; pt. has adjusted her seat height and lowered this along with armrests and desk in order to reduce cervical/lumbar strain     Other Self-Care Comments   Discussed proper posture and body mechanics with daily activities including sleeping positioning, laundry, sweeping, moping, vacuuming, getting in and out of car, putting on shoes/socks, etc to reduce lumbar  strain with daily tasks       Lumbar Exercises: Stretches   Single Knee to Chest Stretch  Right;Left;30 seconds;1 rep    Single Knee to Chest Stretch Limitations  opp LE straight     ITB Stretch  Left;Right;1 rep;30 seconds   Improved LE relaxation with less cueing    ITB Stretch Limitations  supine with strap       Lumbar Exercises: Aerobic   Nustep  Lvl 4, 6 min (LE only)      Lumbar Exercises: Supine   Pelvic Tilt  10 reps;5 seconds    Pelvic Tilt Limitations  Improved technique with no bridging motion after visual demonstration from therapist                PT Short Term Goals - 07/09/19 1753      PT SHORT TERM GOAL #1   Title  Patient will be independent with initial HEP    Status  Achieved   07/09/19   Target Date  07/15/19      PT SHORT TERM GOAL #2   Title  Patient will verbalize/demonstrate understanding of neutral spine posture and proper body mechanics to reduce strain on spine    Status  On-going    Target Date  07/15/19        PT Long Term Goals - 07/07/19 1804      PT LONG TERM GOAL #1   Title  Patient will be independent with ongoing/advanced HEP +/- gym program    Status  On-going      PT LONG TERM GOAL #2   Title  Patient to demonstrate appropriate posture and body mechanics needed for daily activities    Status  On-going      PT LONG TERM GOAL #3   Title  Lumbar ROM WFL w/o restriction d/t pain    Status  On-going      PT LONG TERM GOAL #4   Title  B hip strength >/= 4+/5 w/o increased pain    Status  On-going      PT LONG TERM GOAL #5   Title  Patient to report ability to perform ADLs, household and work-related tasks without increased pain    Status  On-going      PT LONG TERM GOAL #6   Title  Patient to return to walking for exercise and working out w/o limitation due to back pain    Status  On-going  Plan - 07/09/19 1710    Clinical Impression Statement  Cynthia [name] reporting improved pain levels over last few days.   Did comment that she has been skipping doing laundry as this is normally a triggering task for LBP.  Session today focused on review of proper posture and body mechanics with daily tasks and household chores.  Instruction focused on body mechanics with laundry, sweeping, vacuuming, kitchen work, sleeping positions, bed mobility for reduction in lumbar strain.  Therex focused on LE stretching with review of pelvic tilt and supine ITB stretch with strap with pt. able to demo improved technique with these activities.  STG #1 now achieved.  Ended session with pt. pain free thus modalities deferred.    Comorbidities  chronic LBP with worsening since December 2020 and acute exacerbation 06/27/19; remote h/o lumbar surgery; craniotomy for excision of acoustic neuroma 2017 - deaf in L ear & intermittent vertigo; h/o thyroid cancer 2016 s/p thyroid lobectomy    Rehab Potential  Good    PT Treatment/Interventions  ADLs/Self Care Home Management;Cryotherapy;Electrical Stimulation;Iontophoresis 4mg /ml Dexamethasone;Moist Heat;Traction;Ultrasound;Gait training;Functional mobility training;Therapeutic activities;Therapeutic exercise;Balance training;Neuromuscular re-education;Patient/family education;Manual techniques;Passive range of motion;Dry needling;Taping;Spinal Manipulations;Joint Manipulations    PT Next Visit Plan  Posture and body mechanics education; thoracolumbopelvic flexibility and strengthening (extension preference if tolerated); manual therapy and modalities PRN    PT Home Exercise Plan  07/01/19 - HS, ITB, hip flexor & SKTC stretches, LTR, pelvic tilts    Consulted and Agree with Plan of Care  Patient       Patient will benefit from skilled therapeutic intervention in order to improve the following deficits and impairments:  Decreased activity tolerance, Decreased balance, Decreased endurance, Decreased knowledge of precautions, Decreased mobility, Decreased range of motion, Decreased safety awareness,  Decreased strength, Difficulty walking, Hypomobility, Increased fascial restricitons, Increased muscle spasms, Impaired perceived functional ability, Impaired flexibility, Impaired sensation, Improper body mechanics, Postural dysfunction, Pain  Visit Diagnosis: Chronic bilateral low back pain with left-sided sciatica  Muscle spasm of back  Muscle weakness (generalized)  Difficulty in walking, not elsewhere classified     Problem List Patient Active Problem List   Diagnosis Date Noted  . Atrophic vaginitis 06/03/2019  . Family history of osteoporosis 03/05/2019  . Post-menopausal 03/05/2019  . Motion sickness 12/26/2017  . 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 Fatty liver disease, nonalcoholic 123456  . Left renal mass 03/26/2017  . Obesity (BMI 30.0-34.9) 03/26/2017  . Chronic left-sided low back pain 09/19/2016  . 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 Hashimoto's thyroiditis 06/14/2015  . 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 Hypokalemia 02/13/2015  . 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[place]) 02/11/2015  . 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 ADD (attention deficit disorder) 05/11/2014  . Chronic constipation 05/11/2014  . Essential (primary) hypertension 05/11/2014  . Gastro-esophageal reflux disease without esophagitis 05/11/2014  . Asthma, mild persistent 05/11/2014  . Interdigital neuralgia 04/14/2013  . Iron deficiency anemia, unspecified 02/18/2013    Bess Harvest, PTA 07/09/19 5:54 PM   [facility] [address]  [place], [place], [address] Phone: [phone]   Fax:  [phone]  Name: Cynthia [name] MRN: [id number] Date of Birth: 03-27-1961

## 2019-07-09 NOTE — Patient Instructions (Signed)

## 2019-07-10 NOTE — Progress Notes (Signed)
Normal mammogram. Follow up in 1 year.

## 2019-07-13 ENCOUNTER — Encounter: Payer: Self-pay | Admitting: Obstetrics & Gynecology

## 2019-07-13 ENCOUNTER — Ambulatory Visit (INDEPENDENT_AMBULATORY_CARE_PROVIDER_SITE_OTHER): Payer: BC Managed Care – PPO | Admitting: Obstetrics & Gynecology

## 2019-07-13 ENCOUNTER — Other Ambulatory Visit: Payer: Self-pay

## 2019-07-13 VITALS — BP 120/72 | HR 80 | Temp 97.9°F | Resp 10 | Ht 66.75 in | Wt 198.0 lb

## 2019-07-13 DIAGNOSIS — Z01419 Encounter for gynecological examination (general) (routine) without abnormal findings: Secondary | ICD-10-CM

## 2019-07-13 DIAGNOSIS — N952 Postmenopausal atrophic vaginitis: Secondary | ICD-10-CM

## 2019-07-13 DIAGNOSIS — Z780 Asymptomatic menopausal state: Secondary | ICD-10-CM | POA: Diagnosis not present

## 2019-07-13 MED ORDER — VALACYCLOVIR HCL 1 G PO TABS: ORAL_TABLET | ORAL | 1 refills | Status: DC

## 2019-07-13 MED ORDER — ESTRADIOL 0.1 MG/GM VA CREA: TOPICAL_CREAM | VAGINAL | 6 refills | Status: DC

## 2019-07-13 NOTE — Progress Notes (Addendum)
59 y.o. G0P0000 Married White or Caucasian female here for annual exam.  She hurt her back about two weeks ago.  Had a steroid injection and has started PT now.  She is already better.    Denies vaginal bleeding.    Mother is moving to [place] from [place], [place].  Move date if 08/03/2019.  Brother has esophageal cancer and is having immunotherapy.  Family is being very careful.    No LMP recorded. Patient has had an ablation.          Sexually active: Yes.    The current method of family planning is vasectomy and postmenopausal.    Exercising: No.  Exercise is limited by back pain. Smoker:  no  Health Maintenance: Pap:   03/03/18 Neg   08/12/15 Neg. HR HPV:neg              04/16/13 Neg  History of abnormal Pap:  no MMG:  07/08/19 BIRADS 1 negative/density b Colonoscopy:  12/15/10 f/u 10 years, endoscopy was negative 2019 (done due to family hx of esophogeal cancer) BMD:  07/08/19 Normal.  Follow up 5 years.   TDaP:  07/20/16  Pneumonia vaccine(s):  n/a Shingrix:   Completed Hep C testing: 11/30/16 Neg Screening Labs: PCP   reports that she has never smoked. She has never used smokeless tobacco. She reports current alcohol use of about 1.0 - 2.0 standard drinks of alcohol per week. She reports that she does not use drugs.  Past Medical History:  Diagnosis Date  . Acoustic neuroma ([place])   . ADD (attention deficit disorder)    and OCD-on Vyvanse  . Asthma    w/out status asthmaticus  . Awareness under anesthesia   . Balance problem    after acoustic neuroma excision  . Benign brain tumor ([place])   . Chronic back pain   . Constipation, chronic    IBS, CIC  . Diabetes mellitus   . Difficult airway   . 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 GERD (gastroesophageal reflux disease)   . Iron deficiency anemia, unspecified 02/18/2013  . Obesity    296 lbs (highest weight in 2012)   . Tendonitis, Achilles, left     Past Surgical History:  Procedure Laterality Date  .  ABLATION     and bladder sling  . ACHILLES TENDON REPAIR     x2  . ANAL FISSURE REPAIR  2012  . BACK SURGERY    . CHOLECYSTECTOMY    . CRANIOTOMY  09/13/2015   with excision of acoustic neuroma  . IMPLANTATION BONE ANCHORED HEARING AID    . MOUTH SURGERY  2015   3 implants  . OVARIAN CYST REMOVAL     x4  . THYROID LOBECTOMY Right 04/28/2015   Partial   . TOE FUSION Left     Current Outpatient Medications  Medication Sig Dispense Refill  . Albuterol Sulfate (PROAIR RESPICLICK) 123XX123 (90 Base) MCG/ACT AEPB Inhale into the lungs.    Derrill Memo ON 08/02/2019] amphetamine-dextroamphetamine (ADDERALL XR) 30 MG 24 hr capsule Take 1 capsule (30 mg total) by mouth every morning. 30 capsule 0  . buPROPion (WELLBUTRIN XL) 300 MG 24 hr tablet TAKE 1 TABLET DAILY 90 tablet 1  . celecoxib (CELEBREX) 200 MG capsule Take 1 capsule (200 mg total) by mouth 2 (two) times daily. 180 capsule 3  . cyclobenzaprine (FLEXERIL) 10 MG tablet TAKE 1 TABLET AT BEDTIME 90 tablet 3  .  dexlansoprazole (DEXILANT) 60 MG capsule Take 60 mg by mouth daily.    [name] Kitchen docusate sodium (COLACE) 50 MG capsule Take 100 mg by mouth at bedtime.    [name] Kitchen estradiol (ESTRACE) 0.1 MG/GM vaginal cream 1 gram vaginally twice weekly 42.5 g 6  . levothyroxine (SYNTHROID) 75 MCG tablet Take 1 tablet (75 mcg total) by mouth daily. 90 tablet 3  . LINZESS 290 MCG CAPS capsule Take 1 capsule by mouth daily.    [name] Kitchen triamterene-hydrochlorothiazide (MAXZIDE-25) 37.5-25 MG tablet TAKE 1 TABLET DAILY 90 tablet 3  . valACYclovir (VALTREX) 1000 MG tablet TAKE 1 TAB TWICE A DAY FOR 3 DAYS THEN DAILY (Patient taking differently: 1,000 mg as needed. TAKE 1 TAB TWICE A DAY FOR 3 DAYS THEN DAILY) 30 tablet 1  . VENTOLIN HFA 108 (90 Base) MCG/ACT inhaler TAKE 2 PUFFS BY MOUTH EVERY 6 HOURS AS NEEDED FOR WHEEZE OR SHORTNESS OF BREATH 18 g 2   No current facility-administered medications for this visit.    Family History  Problem Relation Age of Onset  . Thyroid  disease Mother   . Heart Problems Mother        vavle issue  . Dystonia Mother   . Hypercholesterolemia Mother   . Heart attack Father   . Cancer Father        esophageal  . Heart disease Father   . Lung cancer Paternal Grandfather   . Heart disease Paternal Grandfather   . Heart disease Maternal Grandfather   . Heart disease Paternal Grandmother   . Heart disease Other        paternal and maternal side  . Cancer Brother        esophageal/stomach cancer    Review of Systems  All other systems reviewed and are negative.   Exam:   BP 120/72 (BP Location: Right Arm, Patient Position: Sitting, Cuff Size: Normal)   Pulse 80   Temp 97.9 F (36.6 C) (Temporal)   Resp 10   Ht 5' 6.75" (1.695 m)   Wt 198 lb (89.8 kg)   BMI 31.24 kg/m   Height: 5' 6.75" (169.5 cm)  Ht Readings from Last 3 Encounters:  07/13/19 5' 6.75" (1.695 m)  06/03/19 5\' 7"  (1.702 m)  03/04/19 5\' 7"  (1.702 m)   General appearance: alert, cooperative and appears stated age Head: Normocephalic, without obvious abnormality, atraumatic Neck: no adenopathy, supple, symmetrical, trachea midline and thyroid normal to inspection and palpation Lungs: clear to auscultation bilaterally Breasts: normal appearance, no masses or tenderness Heart: regular rate and rhythm Abdomen: soft, non-tender; bowel sounds normal; no masses,  no organomegaly Extremities: extremities normal, atraumatic, no cyanosis or edema Skin: Skin color, texture, turgor normal. No rashes or lesions Lymph nodes: Cervical, supraclavicular, and axillary nodes normal. No abnormal inguinal nodes palpated Neurologic: Grossly normal   Pelvic: External genitalia:  no lesions              Urethra:  normal appearing urethra with no masses, tenderness or lesions              Bartholins and Skenes: normal                 Vagina: normal appearing vagina with normal color and discharge, no lesions              Cervix: no lesions              Pap taken:  No. Bimanual Exam:  Uterus:  normal size, contour,  position, consistency, mobility, non-tender              Adnexa: normal adnexa and no mass, fullness, tenderness               Rectovaginal: declines               Anus:  no visible lesions  Chaperone, Terence Lux, CMA, was present for exam.  A:  Well Woman with normal exam PMP, no HRT H/o endometrial ablation H/o recurrent BV that has resolved with menopause H/o acoustic neuroma excision 2017 with subsequent hearing loss.  Had bone anchored hearing aid placed (BAHA). H/o right thyroid lobectomy Two family members with esophageal cancer  P:   Mammogram guidelines reviewed.  Just had this done. BMD normal and just done Colonoscopy due this year pap smear with neg HR HPV 2017.  2020 guidelines reviewed.  No hx high grade cervical dysplasia hx.  Will repeat in 2022.   RF estrace vaginal cream 1 gm pv twice weekly.  #42.5gm/4RF Valtrex 1gm po daily and then BID x 3 days with outbreaks.  #30/1RF. Lab work UTD with [place] [facility] in December Return annually or prn

## 2019-07-14 ENCOUNTER — Ambulatory Visit: Payer: BC Managed Care – PPO

## 2019-07-14 DIAGNOSIS — M5442 Lumbago with sciatica, left side: Secondary | ICD-10-CM | POA: Diagnosis not present

## 2019-07-14 DIAGNOSIS — G8929 Other chronic pain: Secondary | ICD-10-CM | POA: Diagnosis not present

## 2019-07-14 DIAGNOSIS — R262 Difficulty in walking, not elsewhere classified: Secondary | ICD-10-CM

## 2019-07-14 DIAGNOSIS — M6281 Muscle weakness (generalized): Secondary | ICD-10-CM

## 2019-07-14 DIAGNOSIS — M62830 Muscle spasm of back: Secondary | ICD-10-CM

## 2019-07-14 NOTE — Therapy (Signed)
[facility] [address]  [place], [place], [address] Phone: [phone]   Fax:  [phone]  Physical Therapy Treatment  Patient Details  Name: Cynthia [name] MRN: [id number] Date of Birth: Nov 10, 1960 Referring Provider (PT): Aundria Mems, MD   Encounter Date: 07/14/2019  PT End of Session - 07/14/19 1704    Visit Number  4    Number of Visits  12    Date for PT Re-Evaluation  08/12/19    Authorization Type  BCBS - VL: 15    Authorization - Number of Visits  71    PT Start Time  [id number]    PT Stop Time  1742    PT Time Calculation (min)  44 min    Activity Tolerance  Patient tolerated treatment well    Behavior During Therapy  [facility] for tasks assessed/performed       Past Medical History:  Diagnosis Date  . Acoustic neuroma ([place])   . ADD (attention deficit disorder)    and OCD-on Vyvanse  . Asthma    w/out status asthmaticus  . Awareness under anesthesia   . Balance problem    after acoustic neuroma excision  . Benign brain tumor ([place])   . Chronic back pain   . Constipation, chronic    IBS, CIC  . Diabetes mellitus   . Difficult airway   . 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 GERD (gastroesophageal reflux disease)   . Iron deficiency anemia, unspecified 02/18/2013  . Obesity    296 lbs (highest weight in 2012)   . Tendonitis, Achilles, left     Past Surgical History:  Procedure Laterality Date  . ABLATION     and bladder sling  . ACHILLES TENDON REPAIR     x2  . ANAL FISSURE REPAIR  2012  . BACK SURGERY    . CHOLECYSTECTOMY    . CRANIOTOMY  09/13/2015   with excision of acoustic neuroma  . IMPLANTATION BONE ANCHORED HEARING AID    . MOUTH SURGERY  2015   3 implants  . OVARIAN CYST REMOVAL     x4  . THYROID LOBECTOMY Right 04/28/2015   Partial   . TOE FUSION Left     There were no vitals filed for this visit.  Subjective Assessment - 07/14/19 1702    Subjective   Pt. reporting she has not had any significant back pain since Thursday of last week.    Pertinent History  chronic LBP with worsening since December 2020 and acute exacerbation 06/27/19; remote h/o lumbar surgery; craniotomy for excision of acoustic neuroma 2017 - deaf in L ear & intermittent vertigo; h/o thyroid cancer 2016 s/p thyroid lobectomy    How long can you stand comfortably?  15-20 min    How long can you walk comfortably?  30 min    Diagnostic tests  Lumbar MRI 06/29/19: Multilevel lumbar spondylosis, greatest at L5-S1. At L5-S1, disc bulge with circumferential osteophyte ridge and facet arthrosis contribute to bilateral neural foraminal narrowing (moderate right, moderate/severe left). Correlate for left L5 radiculopathy. Mild bilateral subarticular narrowing also present at this level.  No more than mild spinal canal or neural foraminal narrowing at the remaining levels.  Lumbar x-ray 06/29/19: No fracture or spondylolisthesis is noted. Moderate degenerative disc disease is noted at L1-2, L2-3 and L5-S1.    Patient Stated Goals  "to get back better so I can resume my daily activities  inlcuding working out and walking more"    Currently in Pain?  No/denies    Pain Score  0-No pain    Pain Location  Back                       [facility] Adult PT Treatment/Exercise - 07/14/19 0001      Self-Care   Self-Care  Other Self-Care Comments    Other Self-Care Comments   Instruction on proper progression of walking       Lumbar Exercises: Stretches   Single Knee to Chest Stretch  Right;Left;2 reps;30 seconds    Single Knee to Chest Stretch Limitations  opp LE straight     Lower Trunk Rotation Limitations  5" x 10 reps     Piriformis Stretch  Right;Left;2 reps;30 seconds    Piriformis Stretch Limitations  B    Figure 4 Stretch  2 reps;30 seconds;Supine    Figure 4 Stretch Limitations  towel assistance       Lumbar Exercises: Aerobic   Nustep  Lvl 4, 7 min (LE/UE)      Lumbar  Exercises: Standing   Other Standing Lumbar Exercises  Alternating hip extension x 10 reps leaning over green p-ball       Lumbar Exercises: Seated   Sit to Stand  10 reps    Sit to Stand Limitations  red TB at knees       Lumbar Exercises: Supine   Clam  10 reps;3 seconds    Clam Limitations  red TB     Bent Knee Raise  10 reps;3 seconds    Bent Knee Raise Limitations  brace march; red TB at knees     Bridge  10 reps;3 seconds    Bridge Limitations  mid ROM - well tolerated     Bridge with clamshell  10 reps;3 seconds   + isometric hip abd/ER into red TB at knees      Lumbar Exercises: Sidelying   Clam  10 reps;Right;Left    Clam Limitations  no resistance     Other Sidelying Lumbar Exercises  B "Open Book" stretch 5" x 5 reps                PT Short Term Goals - 07/14/19 1705      PT SHORT TERM GOAL #1   Title  Patient will be independent with initial HEP    Status  Achieved   07/09/19   Target Date  07/15/19      PT SHORT TERM GOAL #2   Title  Patient will verbalize/demonstrate understanding of neutral spine posture and proper body mechanics to reduce strain on spine    Status  Achieved    Target Date  07/15/19        PT Long Term Goals - 07/07/19 1804      PT LONG TERM GOAL #1   Title  Patient will be independent with ongoing/advanced HEP +/- gym program    Status  On-going      PT LONG TERM GOAL #2   Title  Patient to demonstrate appropriate posture and body mechanics needed for daily activities    Status  On-going      PT LONG TERM GOAL #3   Title  Lumbar ROM WFL w/o restriction d/t pain    Status  On-going      PT LONG TERM GOAL #4   Title  B hip strength >/= 4+/5 w/o increased pain  Status  On-going      PT LONG TERM GOAL #5   Title  Patient to report ability to perform ADLs, household and work-related tasks without increased pain    Status  On-going      PT LONG TERM GOAL #6   Title  Patient to return to walking for exercise and  working out w/o limitation due to back pain    Status  On-going            Plan - 07/14/19 1705    Clinical Impression Statement  Pt. reporting she has adjusted her desk positioning and body mechanics with household tasks with good reduction in back pain.  STG #2 achieved.  Pt. reporting she has recently increased her walking distance over this past week and noted point tenderness over R GT area today with palpation.  Pt. educated on idea of progressive walking program being careful to avoid "overdoing it" and increasing walking distance too quickly as to avoid hip irritation.  Pt. verbalized understanding.  Tolerated increased focus on proximal hip strengthening without LBP today and remained pain free throughout session. Will plan to monitor tolerance to today's there and update HEP in coming session.    Comorbidities  chronic LBP with worsening since December 2020 and acute exacerbation 06/27/19; remote h/o lumbar surgery; craniotomy for excision of acoustic neuroma 2017 - deaf in L ear & intermittent vertigo; h/o thyroid cancer 2016 s/p thyroid lobectomy    Rehab Potential  Good    PT Treatment/Interventions  ADLs/Self Care Home Management;Cryotherapy;Electrical Stimulation;Iontophoresis 4mg /ml Dexamethasone;Moist Heat;Traction;Ultrasound;Gait training;Functional mobility training;Therapeutic activities;Therapeutic exercise;Balance training;Neuromuscular re-education;Patient/family education;Manual techniques;Passive range of motion;Dry needling;Taping;Spinal Manipulations;Joint Manipulations    PT Next Visit Plan  Thoracolumbopelvic flexibility and strengthening (extension preference if tolerated); manual therapy and modalities PRN    PT Home Exercise Plan  07/01/19 - HS, ITB, hip flexor & SKTC stretches, LTR, pelvic tilts    Consulted and Agree with Plan of Care  Patient       Patient will benefit from skilled therapeutic intervention in order to improve the following deficits and  impairments:  Decreased activity tolerance, Decreased balance, Decreased endurance, Decreased knowledge of precautions, Decreased mobility, Decreased range of motion, Decreased safety awareness, Decreased strength, Difficulty walking, Hypomobility, Increased fascial restricitons, Increased muscle spasms, Impaired perceived functional ability, Impaired flexibility, Impaired sensation, Improper body mechanics, Postural dysfunction, Pain  Visit Diagnosis: Chronic bilateral low back pain with left-sided sciatica  Muscle spasm of back  Muscle weakness (generalized)  Difficulty in walking, not elsewhere classified     Problem List Patient Active Problem List   Diagnosis Date Noted  . Atrophic vaginitis 06/03/2019  . Family history of osteoporosis 03/05/2019  . Post-menopausal 03/05/2019  . Motion sickness 12/26/2017  . 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 Fatty liver disease, nonalcoholic 123456  . Left renal mass 03/26/2017  . Obesity (BMI 30.0-34.9) 03/26/2017  . Chronic left-sided low back pain 09/19/2016  . 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 Hashimoto's thyroiditis 06/14/2015  . 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 Hypokalemia 02/13/2015  . 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[place]) 02/11/2015  . 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 ADD (attention deficit disorder) 05/11/2014  . Chronic constipation 05/11/2014  . Essential (primary) hypertension 05/11/2014  . Gastro-esophageal reflux disease without esophagitis 05/11/2014  . Asthma, mild persistent 05/11/2014  . Interdigital neuralgia 04/14/2013  . Iron deficiency anemia, unspecified 02/18/2013    Bess Harvest, PTA 07/14/19 5:51 PM  [facility] [address]  [place], [place], [address] Phone: [phone]   Fax:  [phone]  Name: Cynthia [name] MRN: [id number] Date of Birth: 05-02-1961

## 2019-07-16 ENCOUNTER — Other Ambulatory Visit: Payer: Self-pay

## 2019-07-16 ENCOUNTER — Ambulatory Visit: Payer: BC Managed Care – PPO

## 2019-07-16 DIAGNOSIS — M5442 Lumbago with sciatica, left side: Secondary | ICD-10-CM | POA: Diagnosis not present

## 2019-07-16 DIAGNOSIS — M6281 Muscle weakness (generalized): Secondary | ICD-10-CM | POA: Diagnosis not present

## 2019-07-16 DIAGNOSIS — G8929 Other chronic pain: Secondary | ICD-10-CM | POA: Diagnosis not present

## 2019-07-16 DIAGNOSIS — M62830 Muscle spasm of back: Secondary | ICD-10-CM

## 2019-07-16 DIAGNOSIS — R262 Difficulty in walking, not elsewhere classified: Secondary | ICD-10-CM | POA: Diagnosis not present

## 2019-07-16 NOTE — Therapy (Signed)
[facility] [address]  [place], [place], [address] Phone: [phone]   Fax:  [phone]  Physical Therapy Treatment  Patient Details  Name: Cynthia [name] MRN: [id number] Date of Birth: 1961-01-21 Referring Provider (PT): Aundria Mems, MD   Encounter Date: 07/16/2019  PT End of Session - 07/16/19 1710    Visit Number  5    Number of Visits  12    Date for PT Re-Evaluation  08/12/19    Authorization Type  BCBS - VL: 60    Authorization - Number of Visits  60    PT Start Time  1707    PT Stop Time  1803    PT Time Calculation (min)  56 min    Activity Tolerance  Patient tolerated treatment well    Behavior During Therapy  [facility] for tasks assessed/performed       Past Medical History:  Diagnosis Date  . Acoustic neuroma ([place])   . ADD (attention deficit disorder)    and OCD-on Vyvanse  . Asthma    w/out status asthmaticus  . Awareness under anesthesia   . Balance problem    after acoustic neuroma excision  . Benign brain tumor ([place])   . Chronic back pain   . Constipation, chronic    IBS, CIC  . Diabetes mellitus   . Difficult airway   . 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 GERD (gastroesophageal reflux disease)   . Iron deficiency anemia, unspecified 02/18/2013  . Obesity    296 lbs (highest weight in 2012)   . Tendonitis, Achilles, left     Past Surgical History:  Procedure Laterality Date  . ABLATION     and bladder sling  . ACHILLES TENDON REPAIR     x2  . ANAL FISSURE REPAIR  2012  . BACK SURGERY    . CHOLECYSTECTOMY    . CRANIOTOMY  09/13/2015   with excision of acoustic neuroma  . IMPLANTATION BONE ANCHORED HEARING AID    . MOUTH SURGERY  2015   3 implants  . OVARIAN CYST REMOVAL     x4  . THYROID LOBECTOMY Right 04/28/2015   Partial   . TOE FUSION Left     There were no vitals filed for this visit.  Subjective Assessment - 07/16/19 1709    Subjective   Pt. reporting stress at work with "month end" and feels this may have caused some L-LBP after prolonged poor sitting posture.    Pertinent History  chronic LBP with worsening since December 2020 and acute exacerbation 06/27/19; remote h/o lumbar surgery; craniotomy for excision of acoustic neuroma 2017 - deaf in L ear & intermittent vertigo; h/o thyroid cancer 2016 s/p thyroid lobectomy    Diagnostic tests  Lumbar MRI 06/29/19: Multilevel lumbar spondylosis, greatest at L5-S1. At L5-S1, disc bulge with circumferential osteophyte ridge and facet arthrosis contribute to bilateral neural foraminal narrowing (moderate right, moderate/severe left). Correlate for left L5 radiculopathy. Mild bilateral subarticular narrowing also present at this level.  No more than mild spinal canal or neural foraminal narrowing at the remaining levels.  Lumbar x-ray 06/29/19: No fracture or spondylolisthesis is noted. Moderate degenerative disc disease is noted at L1-2, L2-3 and L5-S1.    Patient Stated Goals  "to get back better so I can resume my daily activities inlcuding working out and walking more"    Currently in Pain?  Yes    Pain  Score  2     Pain Location  Back    Pain Orientation  Left;Lower    Pain Descriptors / Indicators  Sore    Pain Type  Acute pain;Chronic pain    Multiple Pain Sites  No                       [facility] Adult PT Treatment/Exercise - 07/16/19 0001      Lumbar Exercises: Stretches   Figure 4 Stretch  2 reps;30 seconds;Supine    Figure 4 Stretch Limitations  R - Figure-4      Lumbar Exercises: Aerobic   Nustep  Lvl 4, 7 min (LE/UE)      Lumbar Exercises: Standing   Row  Both;10 reps;Strengthening;Theraband    Theraband Level (Row)  Level 2 (Red)    Row Limitations  cues for full scapular retraction/ depression required     Other Standing Lumbar Exercises  Alternating hip extension x 10 reps leaning over chair     Other Standing Lumbar Exercises  Standing B Pallof press with  red TB closed in door x 10 reps       Lumbar Exercises: Supine   Clam  10 reps;3 seconds    Clam Limitations  Alternating clam shell; red TB at knees     Bridge with Diona Foley Squeeze  10 reps;3 seconds    Bridge with [employer] Limitations  + adductin ball squeeze     Bridge with clamshell  10 reps;3 seconds   + isometric hip abd/ER into red looped TB at knees      Lumbar Exercises: Quadruped   Straight Leg Raise  10 reps;3 seconds      Modalities   Modalities  Electrical Stimulation;Moist Heat      Moist Heat Therapy   Number Minutes Moist Heat  15 Minutes    Moist Heat Location  Lumbar Spine      Electrical Stimulation   Electrical Stimulation Location  lumbar spine     Electrical Stimulation Action  IFC    Electrical Stimulation Parameters  80-150Hz , intensity to pt. tolerance, 15'             PT Education - 07/16/19 1802    Education Details  HEP update;  bridge/adduction, bridge/clam red TB issued to pt., sidelying clam with red band, supine alternating clam with red TB    Person(s) Educated  Patient    Methods  Explanation;Demonstration;Verbal cues;Handout    Comprehension  Verbalized understanding;Returned demonstration;Verbal cues required       PT Short Term Goals - 07/14/19 1705      PT SHORT TERM GOAL #1   Title  Patient will be independent with initial HEP    Status  Achieved   07/09/19   Target Date  07/15/19      PT SHORT TERM GOAL #2   Title  Patient will verbalize/demonstrate understanding of neutral spine posture and proper body mechanics to reduce strain on spine    Status  Achieved    Target Date  07/15/19        PT Long Term Goals - 07/07/19 1804      PT LONG TERM GOAL #1   Title  Patient will be independent with ongoing/advanced HEP +/- gym program    Status  On-going      PT LONG TERM GOAL #2   Title  Patient to demonstrate appropriate posture and body mechanics needed for daily activities  Status  On-going      PT LONG TERM  GOAL #3   Title  Lumbar ROM WFL w/o restriction d/t pain    Status  On-going      PT LONG TERM GOAL #4   Title  B hip strength >/= 4+/5 w/o increased pain    Status  On-going      PT LONG TERM GOAL #5   Title  Patient to report ability to perform ADLs, household and work-related tasks without increased pain    Status  On-going      PT LONG TERM GOAL #6   Title  Patient to return to walking for exercise and working out w/o limitation due to back pain    Status  On-going            Plan - 07/16/19 [place] with complaint of initial 2/10 L-sided LBP which she feels may have started with prolonged poor sitting posture with work from home.  Was able to tolerated progression of extension strengthening activities in today's session well and ended visit with trial of E-stim/moist heat to lumbar spine for pain relief with good response.  Updated HEP with mat level hip strengthening activities.  Will monitor tolerance to updated HEP in coming session.    Comorbidities  chronic LBP with worsening since December 2020 and acute exacerbation 06/27/19; remote h/o lumbar surgery; craniotomy for excision of acoustic neuroma 2017 - deaf in L ear & intermittent vertigo; h/o thyroid cancer 2016 s/p thyroid lobectomy    Rehab Potential  Good    PT Treatment/Interventions  ADLs/Self Care Home Management;Cryotherapy;Electrical Stimulation;Iontophoresis 4mg /ml Dexamethasone;Moist Heat;Traction;Ultrasound;Gait training;Functional mobility training;Therapeutic activities;Therapeutic exercise;Balance training;Neuromuscular re-education;Patient/family education;Manual techniques;Passive range of motion;Dry needling;Taping;Spinal Manipulations;Joint Manipulations    PT Next Visit Plan  Thoracolumbopelvic flexibility and strengthening (extension preference if tolerated); manual therapy and modalities PRN    PT Home Exercise Plan  07/01/19 - HS, ITB, hip flexor & SKTC stretches, LTR,  pelvic tilts; 07/16/19 - bridge/adduction, bridge/clam red TB, sidelying clam with red band, supine alternating clam with red TB    Consulted and Agree with Plan of Care  Patient       Patient will benefit from skilled therapeutic intervention in order to improve the following deficits and impairments:  Decreased activity tolerance, Decreased balance, Decreased endurance, Decreased knowledge of precautions, Decreased mobility, Decreased range of motion, Decreased safety awareness, Decreased strength, Difficulty walking, Hypomobility, Increased fascial restricitons, Increased muscle spasms, Impaired perceived functional ability, Impaired flexibility, Impaired sensation, Improper body mechanics, Postural dysfunction, Pain  Visit Diagnosis: Chronic bilateral low back pain with left-sided sciatica  Muscle spasm of back  Muscle weakness (generalized)  Difficulty in walking, not elsewhere classified     Problem List Patient Active Problem List   Diagnosis Date Noted  . Atrophic vaginitis 06/03/2019  . Family history of osteoporosis 03/05/2019  . Post-menopausal 03/05/2019  . Motion sickness 12/26/2017  . 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 Fatty liver disease, nonalcoholic 123456  . Left renal mass 03/26/2017  . Obesity (BMI 30.0-34.9) 03/26/2017  . Chronic left-sided low back pain 09/19/2016  . 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[place]) 12/06/2015  . 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 Hashimoto's thyroiditis 06/14/2015  . 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 Hypokalemia 02/13/2015  . 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 ADD (attention deficit disorder) 05/11/2014  . Chronic constipation 05/11/2014  . Essential (primary) hypertension 05/11/2014  .  Gastro-esophageal reflux disease without esophagitis 05/11/2014  . Asthma, mild persistent 05/11/2014  . Interdigital neuralgia 04/14/2013  . Iron deficiency anemia, unspecified 02/18/2013     Bess Harvest, PTA 07/16/19 6:04 PM   [facility] [address]  [place], [place], [address] Phone: [phone]   Fax:  [phone]  Name: Cynthia [name] MRN: [id number] Date of Birth: 11/01/1960

## 2019-07-21 ENCOUNTER — Ambulatory Visit: Payer: BC Managed Care – PPO | Attending: Sports Medicine | Admitting: Physical Therapy

## 2019-07-21 ENCOUNTER — Encounter: Payer: Self-pay | Admitting: Physical Therapy

## 2019-07-21 ENCOUNTER — Other Ambulatory Visit: Payer: Self-pay

## 2019-07-21 DIAGNOSIS — G8929 Other chronic pain: Secondary | ICD-10-CM

## 2019-07-21 DIAGNOSIS — M62830 Muscle spasm of back: Secondary | ICD-10-CM | POA: Insufficient documentation

## 2019-07-21 DIAGNOSIS — M5442 Lumbago with sciatica, left side: Secondary | ICD-10-CM | POA: Diagnosis not present

## 2019-07-21 DIAGNOSIS — M6281 Muscle weakness (generalized): Secondary | ICD-10-CM | POA: Diagnosis present

## 2019-07-21 DIAGNOSIS — R262 Difficulty in walking, not elsewhere classified: Secondary | ICD-10-CM | POA: Diagnosis present

## 2019-07-21 NOTE — Patient Instructions (Signed)
    Home exercise program created by Lemmie Steinhaus, PT.  For questions, please contact Agapito Hanway via phone at [phone] or email at [email or web address]  [facility] [address]  [address] [place], [place], [address] Phone: [phone]   Fax:  [phone]    

## 2019-07-21 NOTE — Therapy (Signed)
[facility] [address]  [place], [place], [address] Phone: [phone]   Fax:  [phone]  Physical Therapy Treatment  Patient Details  Name: Cynthia [name] MRN: [id number] Date of Birth: 12/01/1960 Referring Provider (PT): Aundria Mems, MD   Encounter Date: 07/21/2019  PT End of Session - 07/21/19 1655    Visit Number  6    Number of Visits  12    Date for PT Re-Evaluation  08/12/19    Authorization Type  BCBS - VL: 36    Authorization - Number of Visits  60    PT Start Time  8921    PT Stop Time  1743    PT Time Calculation (min)  48 min    Activity Tolerance  Patient tolerated treatment well    Behavior During Therapy  [facility] for tasks assessed/performed       Past Medical History:  Diagnosis Date  . Acoustic neuroma ([place])   . ADD (attention deficit disorder)    and OCD-on Vyvanse  . Asthma    w/out status asthmaticus  . Awareness under anesthesia   . Balance problem    after acoustic neuroma excision  . Benign brain tumor ([place])   . Chronic back pain   . Constipation, chronic    IBS, CIC  . Diabetes mellitus   . Difficult airway   . 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 GERD (gastroesophageal reflux disease)   . Iron deficiency anemia, unspecified 02/18/2013  . Obesity    296 lbs (highest weight in 2012)   . Tendonitis, Achilles, left     Past Surgical History:  Procedure Laterality Date  . ABLATION     and bladder sling  . ACHILLES TENDON REPAIR     x2  . ANAL FISSURE REPAIR  2012  . BACK SURGERY    . CHOLECYSTECTOMY    . CRANIOTOMY  09/13/2015   with excision of acoustic neuroma  . IMPLANTATION BONE ANCHORED HEARING AID    . MOUTH SURGERY  2015   3 implants  . OVARIAN CYST REMOVAL     x4  . THYROID LOBECTOMY Right 04/28/2015   Partial   . TOE FUSION Left     There were no vitals filed for this visit.  Subjective Assessment - 07/21/19 1657    Subjective  Pt  reporting her back pain is much better - now only noting an occasional twinge.    Pertinent History  chronic LBP with worsening since December 2020 and acute exacerbation 06/27/19; remote h/o lumbar surgery; craniotomy for excision of acoustic neuroma 2017 - deaf in L ear & intermittent vertigo; h/o thyroid cancer 2016 s/p thyroid lobectomy    Diagnostic tests  Lumbar MRI 06/29/19: Multilevel lumbar spondylosis, greatest at L5-S1. At L5-S1, disc bulge with circumferential osteophyte ridge and facet arthrosis contribute to bilateral neural foraminal narrowing (moderate right, moderate/severe left). Correlate for left L5 radiculopathy. Mild bilateral subarticular narrowing also present at this level.  No more than mild spinal canal or neural foraminal narrowing at the remaining levels.  Lumbar x-ray 06/29/19: No fracture or spondylolisthesis is noted. Moderate degenerative disc disease is noted at L1-2, L2-3 and L5-S1.    Patient Stated Goals  "to get back better so I can resume my daily activities inlcuding working out and walking more"    Currently in Pain?  No/denies  [facility] Adult PT Treatment/Exercise - 07/21/19 1655      Exercises   Exercises  Lumbar      Lumbar Exercises: Stretches   Prone on Elbows Stretch  30 seconds;1 rep    Press Ups  5 reps;5 seconds    Quadruped Mid Back Stretch  30 seconds;3 reps    Quadruped Mid Back Stretch Limitations  3-way prayer stretch - seated & quadruped      Lumbar Exercises: Aerobic   Elliptical  L2.0 x 6 min      Lumbar Exercises: Standing   Row  Both;10 reps;Strengthening;Theraband    Theraband Level (Row)  Level 2 (Red)    Row Limitations  cues for abdominal bracing (staggered stance) + scap retraction & depression avoiding excessive shoulder extension    Shoulder Extension  Both;10 reps;Strengthening;Theraband    Theraband Level (Shoulder Extension)  Level 2 (Red)    Shoulder Extension Limitations  cues for  abdominal bracing (staggered stance) + scap retraction & depression avoiding excessive shoulder extension    Other Standing Lumbar Exercises  B pallof press with red TB x 10 each      Lumbar Exercises: Supine   Clam  10 reps;3 seconds    Clam Limitations  alt hip ABD/ER with green TB    Bridge  10 reps;3 seconds    Bridge Limitations  + hip ABD isometric with green TB    Bridge with Ball Squeeze  10 reps;3 seconds      Lumbar Exercises: Sidelying   Clam  Right;Left;10 reps;3 seconds    Clam Limitations  green TB             PT Education - 07/21/19 1740    Education Details  HEP update - red TB rows/ retraction with abd bracing, red TB pallof press, prayer stretch, McKenzie POE/prone press-ups    Person(s) Educated  Patient    Methods  Explanation;Demonstration;Verbal cues;Handout    Comprehension  Verbalized understanding;Returned demonstration;Verbal cues required;Need further instruction       PT Short Term Goals - 07/21/19 1700      PT SHORT TERM GOAL #1   Title  Patient will be independent with initial HEP    Status  Achieved   07/09/19     PT SHORT TERM GOAL #2   Title  Patient will verbalize/demonstrate understanding of neutral spine posture and proper body mechanics to reduce strain on spine    Status  Achieved   07/14/19       PT Long Term Goals - 07/21/19 1700      PT LONG TERM GOAL #1   Title  Patient will be independent with ongoing/advanced HEP +/- gym program    Status  Partially Met    Target Date  08/12/19      PT LONG TERM GOAL #2   Title  Patient to demonstrate appropriate posture and body mechanics needed for daily activities    Status  Partially Met    Target Date  08/12/19      PT LONG TERM GOAL #3   Title  Lumbar ROM WFL w/o restriction d/t pain    Status  On-going    Target Date  08/12/19      PT LONG TERM GOAL #4   Title  B hip strength >/= 4+/5 w/o increased pain    Status  On-going    Target Date  08/12/19      PT LONG TERM  GOAL #5   Title  Patient  to report ability to perform ADLs, household and work-related tasks without increased pain    Status  Partially Met    Target Date  08/12/19      PT LONG TERM GOAL #6   Title  Patient to return to walking for exercise and working out w/o limitation due to back pain    Status  On-going            Plan - 07/21/19 1701    Clinical Impression Statement  Teryn reporting no pain today with only mild "twinges" recently. She reports better awareness of posture and good compliance with HEP and feel that these are helping, allowing her to do more around the house. Given her current progress, she has expressed interest in finishing up with PT soon, therefore reviewed HEP providing green TB for progression of current supine lumbopelvic exercises as she felt red Tb was getting easy. Also added stretching and strengthening targeting mid/upper back and periscapular areas at patient request along with review of McKenzie extension exercises as she feels these have helped her in the past. Will plan for formal goal assessment next visit and anticipate transition to HEP with possible 30-day hold next visit.    Comorbidities  chronic LBP with worsening since December 2020 and acute exacerbation 06/27/19; remote h/o lumbar surgery; craniotomy for excision of acoustic neuroma 2017 - deaf in L ear & intermittent vertigo; h/o thyroid cancer 2016 s/p thyroid lobectomy    Rehab Potential  Good    PT Frequency  2x / week    PT Duration  6 weeks    PT Treatment/Interventions  ADLs/Self Care Home Management;Cryotherapy;Electrical Stimulation;Iontophoresis 33m/ml Dexamethasone;Moist Heat;Traction;Ultrasound;Gait training;Functional mobility training;Therapeutic activities;Therapeutic exercise;Balance training;Neuromuscular re-education;Patient/family education;Manual techniques;Passive range of motion;Dry needling;Taping;Spinal Manipulations;Joint Manipulations    PT Next Visit Plan  FOTO & Goal  assessment with probable transition to HEP +/- 30-day hold; Thoracolumbopelvic flexibility and strengthening (extension preference if tolerated); manual therapy and modalities PRN    PT Home Exercise Plan  07/01/19 - HS, ITB, hip flexor & SKTC stretches, LTR, pelvic tilts; 07/16/19 - bridge/adduction, bridge/clam red TB, sidelying clam with red band, supine alternating clam with red TB; 07/21/19 - red TB rows/ retraction with abd bracing, red TB pallof press, prayer stretch, McKenzie POE/prone press-ups    Consulted and Agree with Plan of Care  Patient       Patient will benefit from skilled therapeutic intervention in order to improve the following deficits and impairments:  Decreased activity tolerance, Decreased balance, Decreased endurance, Decreased knowledge of precautions, Decreased mobility, Decreased range of motion, Decreased safety awareness, Decreased strength, Difficulty walking, Hypomobility, Increased fascial restricitons, Increased muscle spasms, Impaired perceived functional ability, Impaired flexibility, Impaired sensation, Improper body mechanics, Postural dysfunction, Pain  Visit Diagnosis: Chronic bilateral low back pain with left-sided sciatica  Muscle spasm of back  Muscle weakness (generalized)  Difficulty in walking, not elsewhere classified     Problem List Patient Active Problem List   Diagnosis Date Noted  . Atrophic vaginitis 06/03/2019  . Family history of osteoporosis 03/05/2019  . Post-menopausal 03/05/2019  . Motion sickness 12/26/2017  . 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 Fatty liver disease, nonalcoholic 182/50/5397 . Left renal mass 03/26/2017  . Obesity (BMI 30.0-34.9) 03/26/2017  . Chronic left-sided low back pain 09/19/2016  . 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[place] 12/06/2015  . 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 Hashimoto's thyroiditis 06/14/2015  . 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 Hypokalemia 02/13/2015  . Left acoustic  neuroma  ([place]) 02/11/2015  . 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 ADD (attention deficit disorder) 05/11/2014  . Chronic constipation 05/11/2014  . Essential (primary) hypertension 05/11/2014  . Gastro-esophageal reflux disease without esophagitis 05/11/2014  . Asthma, mild persistent 05/11/2014  . Interdigital neuralgia 04/14/2013  . Iron deficiency anemia, unspecified 02/18/2013    Percival Spanish, PT, MPT 07/21/2019, 6:00 PM  [facility] [address]  [place], [place], [address] Phone: [phone]   Fax:  [phone]  Name: Athea Haley MRN: [id number] Date of Birth: [birth year]

## 2019-07-23 ENCOUNTER — Other Ambulatory Visit: Payer: Self-pay

## 2019-07-23 ENCOUNTER — Encounter: Payer: Self-pay | Admitting: Physical Therapy

## 2019-07-23 ENCOUNTER — Ambulatory Visit: Payer: BC Managed Care – PPO | Admitting: Physical Therapy

## 2019-07-23 DIAGNOSIS — M62830 Muscle spasm of back: Secondary | ICD-10-CM

## 2019-07-23 DIAGNOSIS — M6281 Muscle weakness (generalized): Secondary | ICD-10-CM

## 2019-07-23 DIAGNOSIS — R262 Difficulty in walking, not elsewhere classified: Secondary | ICD-10-CM

## 2019-07-23 DIAGNOSIS — G8929 Other chronic pain: Secondary | ICD-10-CM

## 2019-07-23 DIAGNOSIS — M5442 Lumbago with sciatica, left side: Secondary | ICD-10-CM | POA: Diagnosis not present

## 2019-07-23 NOTE — Therapy (Addendum)
[facility] [address]  [place], [place], [address] Phone: [phone]   Fax:  [phone]  Physical Therapy Treatment / Progress Note / Discharge Summary  Patient Details  Name: Dalya Maselli MRN: [id number] Date of Birth: 21-Sep-1960 Referring Provider (PT): Aundria Mems, MD   Encounter Date: 07/23/2019  PT End of Session - 07/23/19 1700    Visit Number  7    Number of Visits  12    Date for PT Re-Evaluation  08/12/19    Authorization Type  BCBS - VL: 68    Authorization - Number of Visits  60    PT Start Time  1700    PT Stop Time  1732    PT Time Calculation (min)  32 min    Activity Tolerance  Patient tolerated treatment well    Behavior During Therapy  [facility] for tasks assessed/performed       Past Medical History:  Diagnosis Date  . Acoustic neuroma ([place])   . ADD (attention deficit disorder)    and OCD-on Vyvanse  . Asthma    w/out status asthmaticus  . Awareness under anesthesia   . Balance problem    after acoustic neuroma excision  . Benign brain tumor ([place])   . Chronic back pain   . Constipation, chronic    IBS, CIC  . Diabetes mellitus   . Difficult airway   . 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 GERD (gastroesophageal reflux disease)   . Iron deficiency anemia, unspecified 02/18/2013  . Obesity    296 lbs (highest weight in 2012)   . Tendonitis, Achilles, left     Past Surgical History:  Procedure Laterality Date  . ABLATION     and bladder sling  . ACHILLES TENDON REPAIR     x2  . ANAL FISSURE REPAIR  2012  . BACK SURGERY    . CHOLECYSTECTOMY    . CRANIOTOMY  09/13/2015   with excision of acoustic neuroma  . IMPLANTATION BONE ANCHORED HEARING AID    . MOUTH SURGERY  2015   3 implants  . OVARIAN CYST REMOVAL     x4  . THYROID LOBECTOMY Right 04/28/2015   Partial   . TOE FUSION Left     There were no vitals filed for this visit.  Subjective Assessment  - 07/23/19 1704    Subjective  Pt reporting no recent "twinges" and feels ready to proceed with transition to HEP as planned last visit.    Pertinent History  chronic LBP with worsening since December 2020 and acute exacerbation 06/27/19; remote h/o lumbar surgery; craniotomy for excision of acoustic neuroma 2017 - deaf in L ear & intermittent vertigo; h/o thyroid cancer 2016 s/p thyroid lobectomy    Diagnostic tests  Lumbar MRI 06/29/19: Multilevel lumbar spondylosis, greatest at L5-S1. At L5-S1, disc bulge with circumferential osteophyte ridge and facet arthrosis contribute to bilateral neural foraminal narrowing (moderate right, moderate/severe left). Correlate for left L5 radiculopathy. Mild bilateral subarticular narrowing also present at this level.  No more than mild spinal canal or neural foraminal narrowing at the remaining levels.  Lumbar x-ray 06/29/19: No fracture or spondylolisthesis is noted. Moderate degenerative disc disease is noted at L1-2, L2-3 and L5-S1.    Patient Stated Goals  "to get back better so I can resume my daily activities inlcuding working out and walking more"    Currently in Pain?  No/denies  [facility] PT Assessment - 07/23/19 1700      Assessment   Medical Diagnosis  Chronic LBP with L sciatica    Referring Provider (PT)  Aundria Mems, MD    Onset Date/Surgical Date  06/27/19   chronic - acute exacerbation   Next MD Visit  07/30/19      Observation/Other Assessments   Focus on Therapeutic Outcomes (FOTO)   Lumbar - 79% (21% limitation)      AROM   Lumbar Flexion  WFL - hands to ankles    Lumbar Extension  WFL    Lumbar - Right Side Bend  [facility]    Lumbar - Left Side Bend  WFL    Lumbar - Right Rotation  [facility]    Lumbar - Left Rotation  [facility]      Strength   Right Hip Flexion  4+/5    Right Hip Extension  4+/5    Right Hip External Rotation   4+/5    Right Hip Internal Rotation  5/5    Right Hip ABduction  4+/5    Right Hip ADduction  4/5    Left  Hip Flexion  4/5    Left Hip Extension  4/5    Left Hip External Rotation  4+/5    Left Hip Internal Rotation  5/5    Left Hip ABduction  4+/5    Left Hip ADduction  4-/5    Right Knee Flexion  5/5    Right Knee Extension  5/5    Left Knee Flexion  5/5    Left Knee Extension  5/5    Right Ankle Dorsiflexion  5/5    Left Ankle Dorsiflexion  5/5                   [facility] Adult PT Treatment/Exercise - 07/23/19 1700      Exercises   Exercises  Lumbar             PT Education - 07/23/19 1730    Education Details  Review of current status, recommended plan for HEP continuation and 30-day hold status    Person(s) Educated  Patient    Methods  Explanation    Comprehension  Verbalized understanding       PT Short Term Goals - 07/21/19 1700      PT SHORT TERM GOAL #1   Title  Patient will be independent with initial HEP    Status  Achieved   07/09/19     PT SHORT TERM GOAL #2   Title  Patient will verbalize/demonstrate understanding of neutral spine posture and proper body mechanics to reduce strain on spine    Status  Achieved   07/14/19       PT Long Term Goals - 07/23/19 1705      PT LONG TERM GOAL #1   Title  Patient will be independent with ongoing/advanced HEP +/- gym program    Status  Achieved      PT LONG TERM GOAL #2   Title  Patient to demonstrate appropriate posture and body mechanics needed for daily activities    Status  Achieved      PT LONG TERM GOAL #3   Title  Lumbar ROM WFL w/o restriction d/t pain    Status  Achieved      PT LONG TERM GOAL #4   Title  B hip strength >/= 4+/5 w/o increased pain    Status  Partially Met  PT LONG TERM GOAL #5   Title  Patient to report ability to perform ADLs, household and work-related tasks without increased pain    Status  Achieved      PT LONG TERM GOAL #6   Title  Patient to return to walking for exercise and working out w/o limitation due to back pain    Status  Achieved             Plan - 07/23/19 1705    Clinical Impression Statement  Lalena remains pleased with her progress with PT, reporting no recent low back pain or "twinges". She now demonstrates full lumbar AROM w/o pain and has shown significant improvement in her strength with average B LE MMT 4/5 to 5/5 (slightly weaker in R hip vs L). Functionally she reports improved awareness of proper posture and body mechanics and denies any limitations with her typical daily household or work activities. She has been able to resume daily walking for exercise and hopes to soon resume her kickboxing. Her FOTO has exceeded the predicted level of functional limitation of 33% with current limitation only 21%. All goals are now met with exception of strength goal only partially met. Patient feels confident with transitioning to the HEP at this point and understands how to continue to progress the HEP exercises which will continue to address her remaining strength deficits, therefore will procced with transition to HEP and place her on a 30-day hold for PT.    Comorbidities  chronic LBP with worsening since December 2020 and acute exacerbation 06/27/19; remote h/o lumbar surgery; craniotomy for excision of acoustic neuroma 2017 - deaf in L ear & intermittent vertigo; h/o thyroid cancer 2016 s/p thyroid lobectomy    Rehab Potential  Good    PT Frequency  --    PT Duration  --    PT Treatment/Interventions  ADLs/Self Care Home Management;Cryotherapy;Electrical Stimulation;Iontophoresis 19m/ml Dexamethasone;Moist Heat;Traction;Ultrasound;Gait training;Functional mobility training;Therapeutic activities;Therapeutic exercise;Balance training;Neuromuscular re-education;Patient/family education;Manual techniques;Passive range of motion;Dry needling;Taping;Spinal Manipulations;Joint Manipulations    PT Next Visit Plan  30-day hold    PT Home Exercise Plan  07/01/19 - HS, ITB, hip flexor & SKTC stretches, LTR, pelvic tilts; 07/16/19 -  bridge/adduction, bridge/clam red TB, sidelying clam with red band, supine alternating clam with red TB; 07/21/19 - red TB rows/ retraction with abd bracing, red TB pallof press, prayer stretch, McKenzie POE/prone press-ups    Consulted and Agree with Plan of Care  Patient       Patient will benefit from skilled therapeutic intervention in order to improve the following deficits and impairments:  Decreased activity tolerance, Decreased balance, Decreased endurance, Decreased knowledge of precautions, Decreased mobility, Decreased range of motion, Decreased safety awareness, Decreased strength, Difficulty walking, Hypomobility, Increased fascial restricitons, Increased muscle spasms, Impaired perceived functional ability, Impaired flexibility, Impaired sensation, Improper body mechanics, Postural dysfunction, Pain  Visit Diagnosis: Chronic bilateral low back pain with left-sided sciatica  Muscle spasm of back  Muscle weakness (generalized)  Difficulty in walking, not elsewhere classified     Problem List Patient Active Problem List   Diagnosis Date Noted  . Atrophic vaginitis 06/03/2019  . Family history of osteoporosis 03/05/2019  . Post-menopausal 03/05/2019  . Motion sickness 12/26/2017  . 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 Fatty liver disease, nonalcoholic 107/37/1062 . Left renal mass 03/26/2017  . Obesity (BMI 30.0-34.9) 03/26/2017  . Chronic left-sided low back pain 09/19/2016  . 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[place] 12/06/2015  . 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  Hashimoto's thyroiditis 06/14/2015  . 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 Hypokalemia 02/13/2015  . 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 ADD (attention deficit disorder) 05/11/2014  . Chronic constipation 05/11/2014  . Essential (primary) hypertension 05/11/2014  . Gastro-esophageal reflux disease without esophagitis 05/11/2014  . Asthma, mild persistent  05/11/2014  . Interdigital neuralgia 04/14/2013  . Iron deficiency anemia, unspecified 02/18/2013    Percival Spanish, PT, MPT 07/23/2019, 5:52 PM  [facility] [address]  [place], [place], [address] Phone: [phone]   Fax:  [phone]  Name: Nyala Kirchner MRN: [id number] Date of Birth: [birth year]  PHYSICAL THERAPY DISCHARGE SUMMARY  Visits from Start of Care: 7  Current functional level related to goals / functional outcomes:   Refer to above clinical impression for status as of last visit on 07/23/2019. Patient was placed on hold for 30 days and has not needed to return to PT, therefore will proceed with discharge from PT for this episode.   Remaining deficits:   As above.    Education / Equipment:   HEP  Plan: Patient agrees to discharge.  Patient goals were mostly met. Patient is being discharged due to being pleased with the current functional level.  ?????     Percival Spanish, PT, MPT 08/28/19, 9:51 AM  [facility] [address]  [place], [place], [address] Phone: [phone]   Fax:  [phone]

## 2019-07-28 ENCOUNTER — Ambulatory Visit: Payer: BC Managed Care – PPO | Admitting: Physical Therapy

## 2019-07-28 ENCOUNTER — Ambulatory Visit: Payer: BC Managed Care – PPO | Admitting: Internal Medicine

## 2019-07-30 ENCOUNTER — Encounter: Payer: BC Managed Care – PPO | Admitting: Physical Therapy

## 2019-07-30 ENCOUNTER — Ambulatory Visit: Payer: BC Managed Care – PPO | Admitting: Sports Medicine

## 2019-07-31 ENCOUNTER — Other Ambulatory Visit: Payer: Self-pay

## 2019-07-31 ENCOUNTER — Ambulatory Visit: Payer: BC Managed Care – PPO | Admitting: Sports Medicine

## 2019-07-31 DIAGNOSIS — M545 Low back pain, unspecified: Secondary | ICD-10-CM

## 2019-07-31 DIAGNOSIS — G8929 Other chronic pain: Secondary | ICD-10-CM

## 2019-07-31 NOTE — Progress Notes (Signed)
    Procedures performed today:    None.  Independent interpretation of tests performed by another provider:   None.  Impression and Recommendations:    Chronic left-sided low back pain Cynthia [name] returns, she is a pleasant 59 year old female, I injected her left sacroiliac joint at the last visit, she returns today essentially pain-free. She had some other degenerative findings on her MRI that can be used in future interventional targets if needed.    ___________________________________________ Gwen Her. Dianah Field, M.D., ABFM., CAQSM. Primary Care and [place] Instructor of [employer] [employer] [facility] [employer]

## 2019-07-31 NOTE — Assessment & Plan Note (Signed)
Cynthia [name] returns, she is a pleasant 59 year old female, I injected her left sacroiliac joint at the last visit, she returns today essentially pain-free. She had some other degenerative findings on her MRI that can be used in future interventional targets if needed.

## 2019-08-24 ENCOUNTER — Ambulatory Visit: Payer: BC Managed Care – PPO | Admitting: Physician Assistant

## 2019-08-24 ENCOUNTER — Other Ambulatory Visit: Payer: Self-pay

## 2019-08-24 ENCOUNTER — Encounter: Payer: Self-pay | Admitting: Physician Assistant

## 2019-08-24 DIAGNOSIS — F900 Attention-deficit hyperactivity disorder, predominantly inattentive type: Secondary | ICD-10-CM

## 2019-08-24 MED ORDER — AMPHETAMINE-DEXTROAMPHET ER 30 MG PO CP24: 30.0000 mg | ORAL_CAPSULE | ORAL | 0 refills | Status: DC

## 2019-08-24 MED ORDER — AMPHETAMINE-DEXTROAMPHET ER 30 MG PO CP24: 30.0000 mg | ORAL_CAPSULE | Freq: Every day | ORAL | 0 refills | Status: DC

## 2019-08-24 NOTE — Progress Notes (Signed)
Subjective:    Patient ID: Cynthia [name], female    DOB: Jul 20, 1960, 59 y.o.   MRN: [id number]  HPI Pt is a 59 yo female who presents to the clinic for ADHD follow up.   Pt is doing great with no concerns or complaints. Pt is sleeping well. Denies any increase in anxiety, palpitations, headaches. Doing well at work. No problems..    .. Active Ambulatory Problems    Diagnosis Date Noted  . Iron deficiency anemia, unspecified 02/18/2013  . Interdigital neuralgia 04/14/2013  . 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 ADD (attention deficit disorder) 05/11/2014  . Chronic constipation 05/11/2014  . Essential (primary) hypertension 05/11/2014  . Gastro-esophageal reflux disease without esophagitis 05/11/2014  . Asthma, mild persistent 05/11/2014  . 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 Hypokalemia 02/13/2015  . 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 Hashimoto's thyroiditis 06/14/2015  . 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 Chronic left-sided low back pain 09/19/2016  . Fatty liver disease, nonalcoholic 123456  . Left renal mass 03/26/2017  . Obesity (BMI 30.0-34.9) 03/26/2017  . 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 Motion sickness 12/26/2017  . Family history of osteoporosis 03/05/2019  . Post-menopausal 03/05/2019  . Atrophic vaginitis 06/03/2019   Resolved Ambulatory Problems    Diagnosis Date Noted  . IFG (impaired fasting glucose) 09/13/2013  . Backache 09/13/2013  . Wheezing 09/13/2013  . Swelling of limb 09/13/2013  . Obesity, unspecified 09/13/2013  . Low back pain 05/11/2014  . Thyromegaly 02/13/2015  . Subconjunctival hemorrhage of right eye 06/28/2015  . Acoustic neuroma ([place]) 12/10/2014  . Left ear hearing loss 10/14/2015  . Costochondritis 03/21/2016  . Irritable 10/17/2016  . Thyroid adenoma 05/03/2015  . Exposure to influenza 05/07/2018  . Accidental fall 10/27/2018   Past Medical History:  Diagnosis  Date  . Asthma   . Awareness under anesthesia   . Balance problem   . Benign brain tumor ([place])   . Chronic back pain   . Constipation, chronic   . Diabetes mellitus   . Difficult airway   . 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  . GERD (gastroesophageal reflux disease)   . Obesity   . Tendonitis, Achilles, left       Review of Systems  All other systems reviewed and are negative.     Objective:   Physical Exam Vitals reviewed.  Constitutional:      Appearance: Normal appearance. She is obese.  HENT:     Head: Normocephalic.  Cardiovascular:     Rate and Rhythm: Normal rate and regular rhythm.  Pulmonary:     Effort: Pulmonary effort is normal.     Breath sounds: Normal breath sounds.  Neurological:     General: No focal deficit present.     Mental Status: She is alert and oriented to person, place, and time.  Psychiatric:        Mood and Affect: Mood normal.           Assessment & Plan:  [name] KitchenMarland KitchenQuisha was seen today for adhd.  Diagnoses and all orders for this visit:  Attention deficit hyperactivity disorder (ADHD), predominantly inattentive type -     amphetamine-dextroamphetamine (ADDERALL XR) 30 MG 24 hr capsule; Take 1 capsule (30 mg total) by mouth every morning. -     amphetamine-dextroamphetamine (ADDERALL XR) 30 MG 24 hr capsule; Take 1 capsule (30 mg total) by mouth daily. -     amphetamine-dextroamphetamine (ADDERALL XR) 30  MG 24 hr capsule; Take 1 capsule (30 mg total) by mouth daily.   Refilled for 3 months.

## 2019-08-25 DIAGNOSIS — K219 Gastro-esophageal reflux disease without esophagitis: Secondary | ICD-10-CM | POA: Diagnosis not present

## 2019-08-25 DIAGNOSIS — K449 Diaphragmatic hernia without obstruction or gangrene: Secondary | ICD-10-CM | POA: Diagnosis not present

## 2019-08-25 DIAGNOSIS — R1011 Right upper quadrant pain: Secondary | ICD-10-CM | POA: Diagnosis not present

## 2019-08-25 DIAGNOSIS — K5904 Chronic idiopathic constipation: Secondary | ICD-10-CM | POA: Diagnosis not present

## 2019-08-26 DIAGNOSIS — H90A22 Sensorineural hearing loss, unilateral, left ear, with restricted hearing on the contralateral side: Secondary | ICD-10-CM | POA: Diagnosis not present

## 2019-09-01 ENCOUNTER — Ambulatory Visit: Payer: BC Managed Care – PPO | Admitting: Physician Assistant

## 2019-09-08 DIAGNOSIS — H90A21 Sensorineural hearing loss, unilateral, right ear, with restricted hearing on the contralateral side: Secondary | ICD-10-CM | POA: Diagnosis not present

## 2019-10-05 ENCOUNTER — Other Ambulatory Visit: Payer: Self-pay | Admitting: Physician Assistant

## 2019-10-05 DIAGNOSIS — E063 Autoimmune thyroiditis: Secondary | ICD-10-CM

## 2019-10-21 ENCOUNTER — Other Ambulatory Visit: Payer: Self-pay

## 2019-10-21 ENCOUNTER — Ambulatory Visit: Payer: BC Managed Care – PPO | Admitting: Sports Medicine

## 2019-10-21 ENCOUNTER — Encounter: Payer: Self-pay | Admitting: Sports Medicine

## 2019-10-21 DIAGNOSIS — M545 Low back pain, unspecified: Secondary | ICD-10-CM

## 2019-10-21 DIAGNOSIS — L989 Disorder of the skin and subcutaneous tissue, unspecified: Secondary | ICD-10-CM

## 2019-10-21 DIAGNOSIS — G8929 Other chronic pain: Secondary | ICD-10-CM | POA: Diagnosis not present

## 2019-10-21 DIAGNOSIS — C44619 Basal cell carcinoma of skin of left upper limb, including shoulder: Secondary | ICD-10-CM | POA: Insufficient documentation

## 2019-10-21 NOTE — Assessment & Plan Note (Signed)
Cynthia [name] returns, she is a pleasant 59 year old female with chronic low back pain. She has several degenerative processes on the lumbar spine MRI which was done at [facility]. Months ago she had pain referable to her left sacroiliac joint, we did an injection that provided good relief for approximately 4 to 5 months. She is now having a recurrence of pain albeit not as bad, she continues with her rehab exercises. Today I performed a left sacroiliac joint injection under ultrasound guidance, we discussed blocks and ablation in the future if needed. Return as needed for this.

## 2019-10-21 NOTE — Assessment & Plan Note (Signed)
Batina also has a skin lesion on her left upper arm, she does tend to tan a lot. It is concerning for a squamous cell carcinoma so I would like her to come back for a shave biopsy in a 15-minute slot. She will also continue to wear sunscreen, she has a trip coming up to the beach tomorrow.

## 2019-10-21 NOTE — Progress Notes (Signed)
    Procedures performed today:    Procedure: Real-time Ultrasound Guided injection of the left sacroiliac joint Device: Samsung [id number]  Verbal informed consent obtained.  Time-out conducted.  Noted no overlying erythema, induration, or other signs of local infection.  Skin prepped in a sterile fashion.  Local anesthesia: Topical Ethyl chloride.  With sterile technique and under real time ultrasound guidance: 1 cc Kenalog 40, 2 cc lidocaine, 2 cc bupivacaine injected easily Completed without difficulty  Pain immediately resolved suggesting accurate placement of the medication.  Advised to call if fevers/chills, erythema, induration, drainage, or persistent bleeding.  Images permanently stored and available for review in the ultrasound unit.  Impression: Technically successful ultrasound guided injection.  Independent interpretation of notes and tests performed by another provider:   None.  Brief History, Exam, Impression, and Recommendations:    Chronic left-sided low back pain Cynthia [name] returns, she is a pleasant 59 year old female with chronic low back pain. She has several degenerative processes on the lumbar spine MRI which was done at [facility]. Months ago she had pain referable to her left sacroiliac joint, we did an injection that provided good relief for approximately 4 to 5 months. She is now having a recurrence of pain albeit not as bad, she continues with her rehab exercises. Today I performed a left sacroiliac joint injection under ultrasound guidance, we discussed blocks and ablation in the future if needed. Return as needed for this.  Skin lesion of left arm Cynthia [name] also has a skin lesion on her left upper arm, she does tend to tan a lot. It is concerning for a squamous cell carcinoma so I would like her to come back for a shave biopsy in a 15-minute slot. She will also continue to wear sunscreen, she has a trip coming up to the beach  tomorrow.    ___________________________________________ Gwen Her. Dianah Field, M.D., ABFM., CAQSM. Primary Care and [place] Instructor of [place] of [employer] of [employer]

## 2019-10-22 ENCOUNTER — Ambulatory Visit: Payer: BC Managed Care – PPO | Admitting: Sports Medicine

## 2019-11-02 ENCOUNTER — Other Ambulatory Visit: Payer: Self-pay | Admitting: Sports Medicine

## 2019-11-02 ENCOUNTER — Other Ambulatory Visit: Payer: Self-pay

## 2019-11-02 ENCOUNTER — Ambulatory Visit: Payer: BC Managed Care – PPO | Admitting: Sports Medicine

## 2019-11-02 DIAGNOSIS — L989 Disorder of the skin and subcutaneous tissue, unspecified: Secondary | ICD-10-CM | POA: Diagnosis not present

## 2019-11-02 DIAGNOSIS — C44619 Basal cell carcinoma of skin of left upper limb, including shoulder: Secondary | ICD-10-CM | POA: Diagnosis not present

## 2019-11-02 NOTE — Progress Notes (Addendum)
    Procedures performed today:    Procedure: Shave biopsy of left lateral elbow suspicious skin lesion, 1 cm Risks, benefits, and alternatives explained and consent obtained. Time out conducted. Surface prepped with alcohol. 5cc lidocaine with epinephine infiltrated in a field block. Adequate anesthesia ensured. Area prepped and draped in a sterile fashion. Excision performed with: Using a derma blade I made a shave into the deep dermis, then achieved hemostasis with a Hyfrecator. Hemostasis achieved. Pt stable.  Independent interpretation of notes and tests performed by another provider:   None.  Brief History, Exam, Impression, and Recommendations:    Basal cell carcinoma of left upper arm Shave biopsy as above. Question squamous cell carcinoma, no follow-up needed, plan will depend on biopsy results, she does have another lesion somewhat more proximally on the upper arm that we will take if this is malignant.  Update: Skin lesion was a basal cell carcinoma, this is a skin cancer, she needs to return for shave biopsy of the other one    ___________________________________________ Gwen Her. Dianah Field, M.D., ABFM., CAQSM. Primary Care and [place] Instructor of [place] of [facility] of Medicine

## 2019-11-02 NOTE — Assessment & Plan Note (Addendum)
Shave biopsy as above. Question squamous cell carcinoma, no follow-up needed, plan will depend on biopsy results, she does have another lesion somewhat more proximally on the upper arm that we will take if this is malignant.  Update: Skin lesion was a basal cell carcinoma, this is a skin cancer, she needs to return for shave biopsy of the other one

## 2019-11-06 DIAGNOSIS — H90A21 Sensorineural hearing loss, unilateral, right ear, with restricted hearing on the contralateral side: Secondary | ICD-10-CM | POA: Diagnosis not present

## 2019-11-10 DIAGNOSIS — C44619 Basal cell carcinoma of skin of left upper limb, including shoulder: Secondary | ICD-10-CM | POA: Diagnosis not present

## 2019-11-10 DIAGNOSIS — L570 Actinic keratosis: Secondary | ICD-10-CM | POA: Diagnosis not present

## 2019-11-10 DIAGNOSIS — D2361 Other benign neoplasm of skin of right upper limb, including shoulder: Secondary | ICD-10-CM | POA: Diagnosis not present

## 2019-11-24 ENCOUNTER — Other Ambulatory Visit: Payer: Self-pay | Admitting: Physician Assistant

## 2019-11-24 ENCOUNTER — Encounter: Payer: Self-pay | Admitting: Physician Assistant

## 2019-11-24 ENCOUNTER — Ambulatory Visit: Payer: BC Managed Care – PPO | Admitting: Physician Assistant

## 2019-11-24 VITALS — BP 124/72 | HR 74 | Temp 98.3°F | Wt 205.5 lb

## 2019-11-24 DIAGNOSIS — I10 Essential (primary) hypertension: Secondary | ICD-10-CM

## 2019-11-24 DIAGNOSIS — M545 Low back pain, unspecified: Secondary | ICD-10-CM

## 2019-11-24 DIAGNOSIS — R454 Irritability and anger: Secondary | ICD-10-CM

## 2019-11-24 DIAGNOSIS — D497 Neoplasm of unspecified behavior of endocrine glands and other parts of nervous system: Secondary | ICD-10-CM | POA: Diagnosis not present

## 2019-11-24 DIAGNOSIS — F900 Attention-deficit hyperactivity disorder, predominantly inattentive type: Secondary | ICD-10-CM

## 2019-11-24 DIAGNOSIS — E063 Autoimmune thyroiditis: Secondary | ICD-10-CM

## 2019-11-24 DIAGNOSIS — G8929 Other chronic pain: Secondary | ICD-10-CM

## 2019-11-24 DIAGNOSIS — H9191 Unspecified hearing loss, right ear: Secondary | ICD-10-CM

## 2019-11-24 MED ORDER — BUPROPION HCL ER (XL) 300 MG PO TB24: 300.0000 mg | ORAL_TABLET | Freq: Every day | ORAL | 3 refills | Status: DC

## 2019-11-24 MED ORDER — AMPHETAMINE-DEXTROAMPHET ER 30 MG PO CP24: 30.0000 mg | ORAL_CAPSULE | ORAL | 0 refills | Status: DC

## 2019-11-24 MED ORDER — CELECOXIB 200 MG PO CAPS: 200.0000 mg | ORAL_CAPSULE | Freq: Two times a day (BID) | ORAL | 3 refills | Status: DC

## 2019-11-24 NOTE — Progress Notes (Addendum)
Subjective:    Patient ID: Cynthia [name], female    DOB: 09-12-1960, 59 y.o.   MRN: [id number]  HPI  Cynthia [name] is a 59 year old female presenting for medication follow up. She is doing well with medications and does not note any side effects.   ADHD- Patient states she is tolerating Adderall well, denies any trouble with appetite, chest pain, or palpitations. States she is sleeping well   Hashimoto's thyroiditis - Patient endorses increased fatigue. Synthroid dose was increased 6 months ago and patient states she felt much better immediately. She states she would like her TSH rechecked to see if Synthroid needs to be increased again.  Chronic back pain - states she recently got a back injection which has helped her pain significantly. Continues to take Celebrex.   She is down somewhat about losing hearing in her right ear. She continues to see neurology and ENT.   Review of Systems  Constitutional: Positive for fatigue.  HENT: Negative.   Eyes: Negative.   Respiratory: Negative for shortness of breath and wheezing.   Cardiovascular: Negative for chest pain and palpitations.  Gastrointestinal: Positive for constipation (chronic).  Endocrine: Negative for cold intolerance.  Genitourinary: Negative.   Musculoskeletal: Positive for back pain (chronic ).  Skin: Positive for wound (recent excision of basal cell carcinoma on left upper arm ).  Neurological: Negative.   Psychiatric/Behavioral: Negative.        Objective:   Physical Exam Constitutional:      Appearance: Normal appearance.  HENT:     Head: Normocephalic and atraumatic.     Nose: Nose normal.     Mouth/Throat:     Mouth: Mucous membranes are moist.  Eyes:     Extraocular Movements: Extraocular movements intact.     Pupils: Pupils are equal, round, and reactive to light.  Cardiovascular:     Rate and Rhythm: Normal rate and regular rhythm.     Pulses: Normal pulses.  Pulmonary:     Effort: Pulmonary effort is  normal.     Breath sounds: Normal breath sounds. No wheezing.  Skin:    General: Skin is warm and dry.  Neurological:     General: No focal deficit present.     Mental Status: She is alert and oriented to person, place, and time.  Psychiatric:        Mood and Affect: Mood normal.        Behavior: Behavior normal.      Assessment & Plan:   [name] KitchenMarland KitchenBijou was seen today for adhd.  Diagnoses and all orders for this visit:  Attention deficit hyperactivity disorder (ADHD), predominantly inattentive type -     amphetamine-dextroamphetamine (ADDERALL XR) 30 MG 24 hr capsule; Take 1 capsule (30 mg total) by mouth every morning. -     amphetamine-dextroamphetamine (ADDERALL XR) 30 MG 24 hr capsule; Take 1 capsule (30 mg total) by mouth every morning. -     amphetamine-dextroamphetamine (ADDERALL XR) 30 MG 24 hr capsule; Take 1 capsule (30 mg total) by mouth every morning.  Chronic left-sided low back pain without sciatica -     celecoxib (CELEBREX) 200 MG capsule; Take 1 capsule (200 mg total) by mouth 2 (two) times daily.  Irritable -     buPROPion (WELLBUTRIN XL) 300 MG 24 hr tablet; Take 1 tablet (300 mg total) by mouth daily.  Hashimoto's thyroiditis -     TSH -     COMPLETE METABOLIC PANEL WITH GFR  Follicular neoplasm  of thyroid -     TSH  Essential (primary) hypertension -     COMPLETE METABOLIC PANEL WITH GFR  Hearing loss of right ear, unspecified hearing loss type    . ADHD - Continue Adderall XR 30MG  once daily.    Hashimoto's thyroiditis - Patient noticing increased fatigue - Patient to have TSH labs drawn today.  - Currently managed on Synthroid 75MCG. Will adjust dose if needed after labs reviewed.   BP to goal.   Refilled celebrex for pain control.   Refilled for three months.   [name] KitchenVernetta Honey PA-C, have reviewed and agree with the above documentation in it's entirety.

## 2019-11-25 ENCOUNTER — Other Ambulatory Visit: Payer: Self-pay | Admitting: Physician Assistant

## 2019-11-25 LAB — TSH: TSH: 2.09 mIU/L (ref 0.40–4.50)

## 2019-11-25 LAB — COMPLETE METABOLIC PANEL WITH GFR
AG Ratio: 1.9 (calc) (ref 1.0–2.5)
ALT: 12 U/L (ref 6–29)
AST: 17 U/L (ref 10–35)
Albumin: 4.5 g/dL (ref 3.6–5.1)
Alkaline phosphatase (APISO): 64 U/L (ref 37–153)
BUN/Creatinine Ratio: 28 (calc) — ABNORMAL HIGH (ref 6–22)
BUN: 26 mg/dL — ABNORMAL HIGH (ref 7–25)
CO2: 28 mmol/L (ref 20–32)
Calcium: 8.6 mg/dL (ref 8.6–10.4)
Chloride: 101 mmol/L (ref 98–110)
Creat: 0.94 mg/dL (ref 0.50–1.05)
GFR, Est African American: 79 mL/min/{1.73_m2} (ref >=60)
GFR, Est Non African American: 68 mL/min/{1.73_m2} (ref >=60)
Globulin: 2.4 g/dL (calc) (ref 1.9–3.7)
Glucose, Bld: 96 mg/dL (ref 65–99)
Potassium: 3.5 mmol/L (ref 3.5–5.3)
Sodium: 138 mmol/L (ref 135–146)
Total Bilirubin: 0.7 mg/dL (ref 0.2–1.2)
Total Protein: 6.9 g/dL (ref 6.1–8.1)

## 2019-11-25 NOTE — Progress Notes (Signed)
Cynthia [name],   Thyroid looks stable. Labs look great.

## 2020-02-09 DIAGNOSIS — D2271 Melanocytic nevi of right lower limb, including hip: Secondary | ICD-10-CM | POA: Diagnosis not present

## 2020-02-09 DIAGNOSIS — D229 Melanocytic nevi, unspecified: Secondary | ICD-10-CM | POA: Diagnosis not present

## 2020-02-09 DIAGNOSIS — L570 Actinic keratosis: Secondary | ICD-10-CM | POA: Diagnosis not present

## 2020-02-09 DIAGNOSIS — D692 Other nonthrombocytopenic purpura: Secondary | ICD-10-CM | POA: Diagnosis not present

## 2020-02-24 ENCOUNTER — Ambulatory Visit: Payer: BC Managed Care – PPO | Admitting: Physician Assistant

## 2020-02-26 ENCOUNTER — Encounter: Payer: Self-pay | Admitting: Physician Assistant

## 2020-02-26 ENCOUNTER — Ambulatory Visit: Payer: BC Managed Care – PPO | Admitting: Physician Assistant

## 2020-02-26 ENCOUNTER — Other Ambulatory Visit: Payer: Self-pay

## 2020-02-26 VITALS — BP 123/75 | HR 73 | Ht 66.75 in | Wt 209.0 lb

## 2020-02-26 DIAGNOSIS — F900 Attention-deficit hyperactivity disorder, predominantly inattentive type: Secondary | ICD-10-CM

## 2020-02-26 DIAGNOSIS — G8929 Other chronic pain: Secondary | ICD-10-CM

## 2020-02-26 DIAGNOSIS — R251 Tremor, unspecified: Secondary | ICD-10-CM

## 2020-02-26 DIAGNOSIS — Z23 Encounter for immunization: Secondary | ICD-10-CM

## 2020-02-26 DIAGNOSIS — M545 Low back pain, unspecified: Secondary | ICD-10-CM

## 2020-02-26 DIAGNOSIS — I10 Essential (primary) hypertension: Secondary | ICD-10-CM | POA: Diagnosis not present

## 2020-02-26 MED ORDER — TRIAMTERENE-HCTZ 37.5-25 MG PO TABS: 1.0000 | ORAL_TABLET | Freq: Every day | ORAL | 3 refills | Status: DC

## 2020-02-26 MED ORDER — AMPHETAMINE-DEXTROAMPHET ER 30 MG PO CP24: 30.0000 mg | ORAL_CAPSULE | ORAL | 0 refills | Status: DC

## 2020-02-26 MED ORDER — CYCLOBENZAPRINE HCL 10 MG PO TABS: 10.0000 mg | ORAL_TABLET | Freq: Every day | ORAL | 3 refills | Status: DC

## 2020-02-26 NOTE — Patient Instructions (Signed)

## 2020-02-26 NOTE — Progress Notes (Signed)
Subjective:    Patient ID: Cynthia [name], female    DOB: [birth year], 59 y.o.   MRN: [id number]  HPI  Patient is a 59 year old female with hypertension, GERD, asthma, ADD, Hashimoto's thyroiditis, history of left acoustic neuroma who presents to the clinic for medication refills.  ADD-patient is doing well with work and medication.  She denies any increase in anxiety, insomnia, headaches, palpitations.  No problems or concerns.  Hypertension-patient is taking her triamterene daily.  She denies any chest pain, palpitations, dizziness, vision changes.  She does not check her blood pressures at home.  Her overall left-sided back pain has improved significantly.  She continues to use Flexeril regularly.  She would like a refill.  Patient has follow-up with neurology on her left acoustic neuroma in March.  She does mention some right hand tremors.  Usually she notices it when she is using her fine motor skills.  Such as when she is playing with her grandson or trying to do something with detail.  Some days it is worse than others.  She denies any knowledge of tremor at rest.  She denies any gait changes, strength changes, pain, numbness or tingling.   .. Active Ambulatory Problems    Diagnosis Date Noted  . Iron deficiency anemia, unspecified 02/18/2013  . Interdigital neuralgia 04/14/2013  . 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 ADD (attention deficit disorder) 05/11/2014  . Chronic constipation 05/11/2014  . Essential (primary) hypertension 05/11/2014  . Gastro-esophageal reflux disease without esophagitis 05/11/2014  . Asthma, mild persistent 05/11/2014  . 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 Hypokalemia 02/13/2015  . 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 Hashimoto's thyroiditis 06/14/2015  . 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 Chronic left-sided low back pain 09/19/2016  . Fatty liver disease, nonalcoholic 97/07/6376  . Left renal mass  03/26/2017  . Obesity (BMI 30.0-34.9) 03/26/2017  . 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 Motion sickness 12/26/2017  . Family history of osteoporosis 03/05/2019  . Post-menopausal 03/05/2019  . Atrophic vaginitis 06/03/2019  . Basal cell carcinoma of left upper arm 10/21/2019  . Hearing loss of right ear 11/24/2019  . Tremor of right hand 02/26/2020   Resolved Ambulatory Problems    Diagnosis Date Noted  . IFG (impaired fasting glucose) 09/13/2013  . Backache 09/13/2013  . Wheezing 09/13/2013  . Swelling of limb 09/13/2013  . Obesity, unspecified 09/13/2013  . Low back pain 05/11/2014  . Thyromegaly 02/13/2015  . Subconjunctival hemorrhage of right eye 06/28/2015  . Acoustic neuroma ([place]) 12/10/2014  . Left ear hearing loss 10/14/2015  . Costochondritis 03/21/2016  . Irritable 10/17/2016  . Thyroid adenoma 05/03/2015  . Exposure to influenza 05/07/2018  . Accidental fall 10/27/2018   Past Medical History:  Diagnosis Date  . Asthma   . Awareness under anesthesia   . Balance problem   . Benign brain tumor ([place])   . Chronic back pain   . Constipation, chronic   . Diabetes mellitus   . Difficult airway   . 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  . GERD (gastroesophageal reflux disease)   . Obesity   . Tendonitis, Achilles, left      Review of Systems    see HPI.  Objective:   Physical Exam Vitals reviewed.  Constitutional:      Appearance: Normal appearance. She is obese.  HENT:     Head: Normocephalic.  Cardiovascular:     Rate and Rhythm: Normal rate and regular  rhythm.     Pulses: Normal pulses.  Pulmonary:     Effort: Pulmonary effort is normal.  Musculoskeletal:     Comments: Right intention tremor.  Normal ROM of right hand/wrist.  Negative tinels/phalens.  Strength 5/5 hand grip.   Neurological:     General: No focal deficit present.     Mental Status: She is alert and oriented to person, place, and time.     Cranial Nerves: No cranial nerve deficit.      Motor: No weakness.     Gait: Gait normal.  Psychiatric:        Mood and Affect: Mood normal.           Assessment & Plan:  [name] KitchenMarland KitchenCaasi was seen today for adhd.  Diagnoses and all orders for this visit:  Attention deficit hyperactivity disorder (ADHD), predominantly inattentive type -     amphetamine-dextroamphetamine (ADDERALL XR) 30 MG 24 hr capsule; Take 1 capsule (30 mg total) by mouth every morning. -     amphetamine-dextroamphetamine (ADDERALL XR) 30 MG 24 hr capsule; Take 1 capsule (30 mg total) by mouth every morning. -     amphetamine-dextroamphetamine (ADDERALL XR) 30 MG 24 hr capsule; Take 1 capsule (30 mg total) by mouth every morning.  Flu vaccine need -     Flu Vaccine QUAD 36+ mos IM  Essential (primary) hypertension -     triamterene-hydrochlorothiazide (MAXZIDE-25) 37.5-25 MG tablet; Take 1 tablet by mouth daily.  Chronic left-sided low back pain without sciatica -     cyclobenzaprine (FLEXERIL) 10 MG tablet; Take 1 tablet (10 mg total) by mouth at bedtime.  Tremor of right hand   Refilled Adderall for 3 months  Blood pressure stable refilled Maxide.  Refill Flexeril for chronic left-sided back pain.  Back pain is stable with conservative treatment.  Discussed right hand tremor.  Seems likely to be essential tremor.  Handout given.  There is no significant dysfunction and patient declines medication.  If worsens could consider early follow-up with neurology since she does have a past medical history of acoustic neuroma.  No other neurological symptoms.   Pt had problem with flu shot in deltoid and request in gluteus maximus.

## 2020-03-10 ENCOUNTER — Encounter: Payer: Self-pay | Admitting: Physician Assistant

## 2020-04-23 ENCOUNTER — Other Ambulatory Visit: Payer: Self-pay | Admitting: Physician Assistant

## 2020-04-23 DIAGNOSIS — G8929 Other chronic pain: Secondary | ICD-10-CM

## 2020-04-23 DIAGNOSIS — M545 Low back pain, unspecified: Secondary | ICD-10-CM

## 2020-05-03 ENCOUNTER — Other Ambulatory Visit: Payer: Self-pay | Admitting: Gastroenterology

## 2020-05-03 DIAGNOSIS — K5904 Chronic idiopathic constipation: Secondary | ICD-10-CM | POA: Diagnosis not present

## 2020-05-03 DIAGNOSIS — K600 Acute anal fissure: Secondary | ICD-10-CM | POA: Diagnosis not present

## 2020-05-03 DIAGNOSIS — K219 Gastro-esophageal reflux disease without esophagitis: Secondary | ICD-10-CM | POA: Diagnosis not present

## 2020-05-03 DIAGNOSIS — K625 Hemorrhage of anus and rectum: Secondary | ICD-10-CM | POA: Diagnosis not present

## 2020-05-03 DIAGNOSIS — R110 Nausea: Secondary | ICD-10-CM | POA: Diagnosis not present

## 2020-05-03 DIAGNOSIS — R1012 Left upper quadrant pain: Secondary | ICD-10-CM

## 2020-05-20 ENCOUNTER — Ambulatory Visit: Payer: BC Managed Care – PPO | Admitting: Sports Medicine

## 2020-05-20 ENCOUNTER — Other Ambulatory Visit: Payer: Self-pay

## 2020-05-20 DIAGNOSIS — G8929 Other chronic pain: Secondary | ICD-10-CM | POA: Diagnosis not present

## 2020-05-20 DIAGNOSIS — M545 Low back pain, unspecified: Secondary | ICD-10-CM

## 2020-05-20 MED ORDER — PREDNISONE 50 MG PO TABS: ORAL_TABLET | ORAL | 0 refills | Status: DC

## 2020-05-20 MED ORDER — PREGABALIN 75 MG PO CAPS: ORAL_CAPSULE | ORAL | 3 refills | Status: DC

## 2020-05-20 NOTE — Assessment & Plan Note (Signed)
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 No red flag symptoms. We will start conservatively with prednisone, Lyrica, home exercises, she did not tolerate gabapentin in the past. Return to see me in a month, we can proceed with a left L5-S1 transforaminal epidural if no better.

## 2020-05-20 NOTE — Progress Notes (Signed)
    Procedures performed today:    None.  Independent interpretation of notes and tests performed by another provider:   Lumbar spine MRI personally reviewed, multilevel degenerative changes worse at the L5-S1 level with multifactorial left-sided L5 5 S1 neuroforaminal stenosis causing left L5 nerve root impingement.  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 No red flag symptoms. We will start conservatively with prednisone, Lyrica, home exercises, she did not tolerate gabapentin in the past. Return to see me in a month, we can proceed with a left L5-S1 transforaminal epidural if no better.    ___________________________________________ Gwen Her. Dianah Field, M.D., ABFM., CAQSM. Primary Care and [place] Instructor of [place] of [facility] of Medicine

## 2020-05-23 ENCOUNTER — Ambulatory Visit
Admission: RE | Admit: 2020-05-23 | Discharge: 2020-05-23 | Disposition: A | Discharge status: Home / Self Care | Payer: BC Managed Care – PPO | Source: Ambulatory Visit | Attending: Gastroenterology | Admitting: Gastroenterology

## 2020-05-23 DIAGNOSIS — K449 Diaphragmatic hernia without obstruction or gangrene: Secondary | ICD-10-CM | POA: Diagnosis not present

## 2020-05-23 DIAGNOSIS — R110 Nausea: Secondary | ICD-10-CM | POA: Diagnosis not present

## 2020-05-23 DIAGNOSIS — Z85850 Personal history of malignant neoplasm of thyroid: Secondary | ICD-10-CM | POA: Diagnosis not present

## 2020-05-23 DIAGNOSIS — R1012 Left upper quadrant pain: Secondary | ICD-10-CM

## 2020-05-23 DIAGNOSIS — Z8742 Personal history of other diseases of the female genital tract: Secondary | ICD-10-CM | POA: Diagnosis not present

## 2020-05-23 IMAGING — CT CT ABD-PELV W/ CM
1 of 3 series · 13 of 32 positions shown, 19 images · IV contrast (APPLIED)
Comparison: [DATE] abdominal ultrasound. [DATE]
abdominopelvic CT, report only.

CLINICAL DATA: Left upper quadrant pain and nausea. Prior
cholecystectomy and anal fissure repair. Thyroid cancer with partial
thyroidectomy. Ovarian cyst resection.

EXAM:
CT ABDOMEN AND PELVIS WITH CONTRAST
TECHNIQUE: Multidetector CT imaging of the abdomen and pelvis was performed
using the standard protocol following bolus administration of
intravenous contrast.
CONTRAST:  100mL [AR] IOPAMIDOL ([AR]) INJECTION 61%

[Series 2: abd/pelvis w/cm · axial · 0.81mm/px · z∈[-496,-71]mm · 13 of 99 slices shown, 19 images]
[im 7/99  soft-tissue]
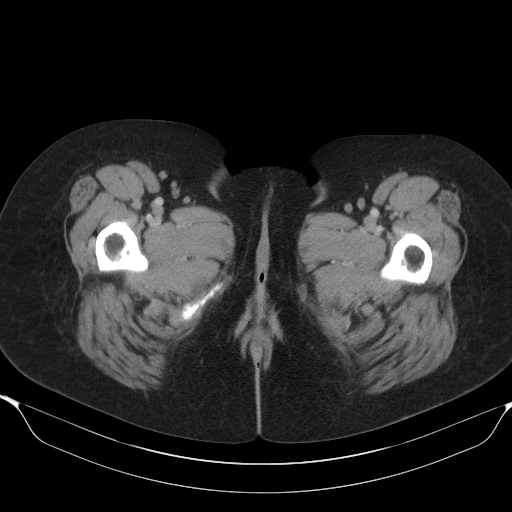
[im 7/99  bone]
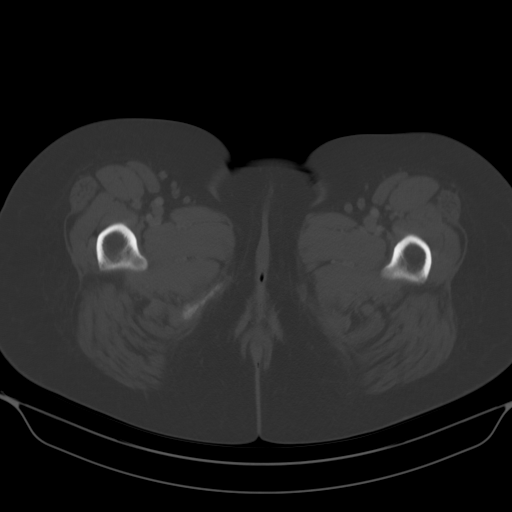
[im 14/99  soft-tissue]
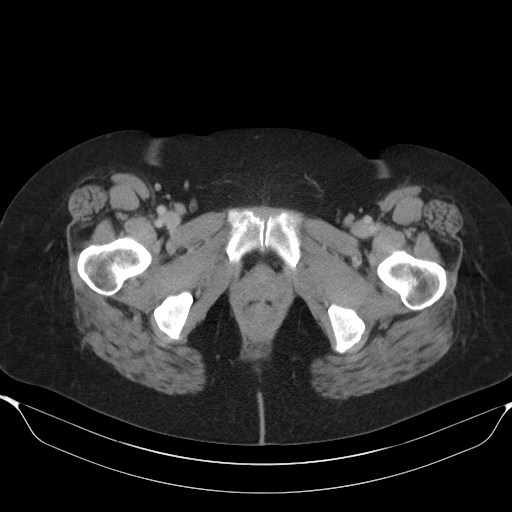
[im 20/99  soft-tissue]
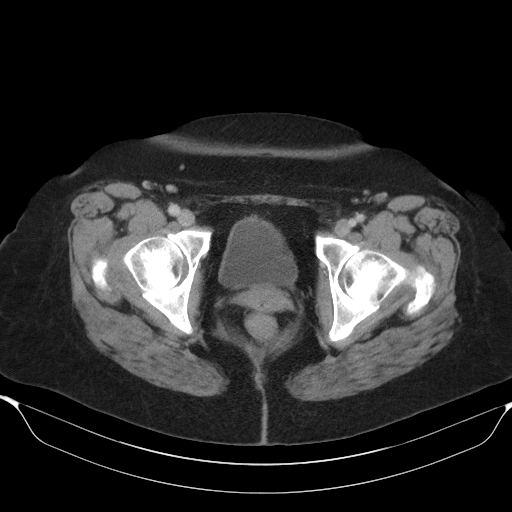
[im 27/99  soft-tissue]
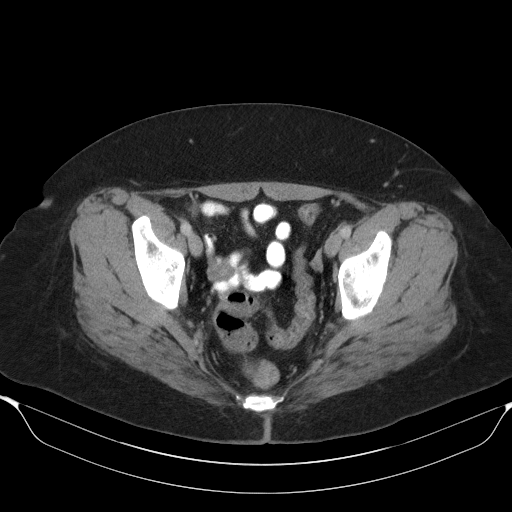
[im 33/99  soft-tissue]
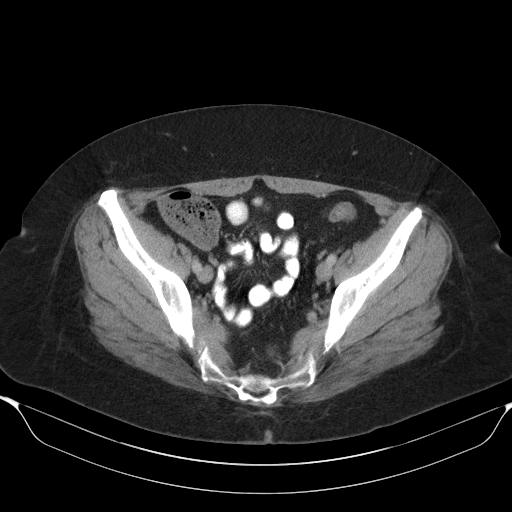
[im 40/99  soft-tissue]
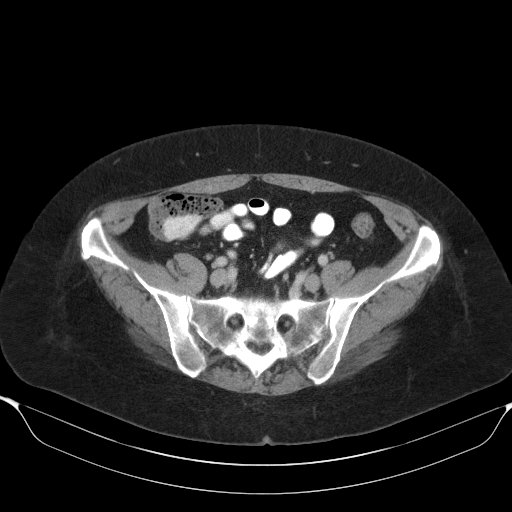
[im 53/99  soft-tissue]
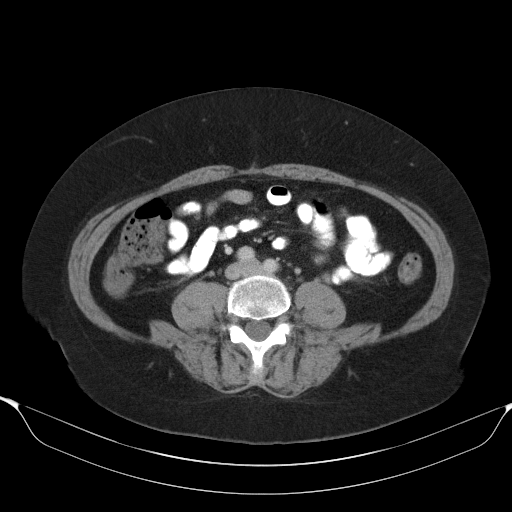
[im 59/99  soft-tissue]
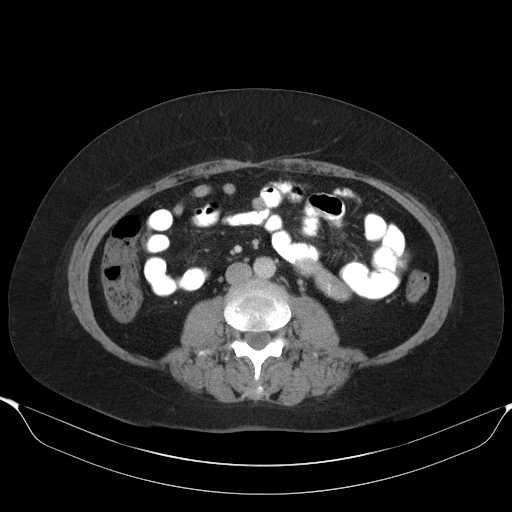
[im 66/99  soft-tissue]
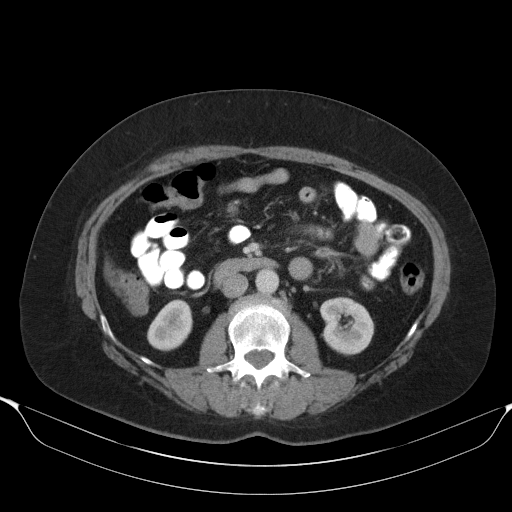
[im 66/99  bone]
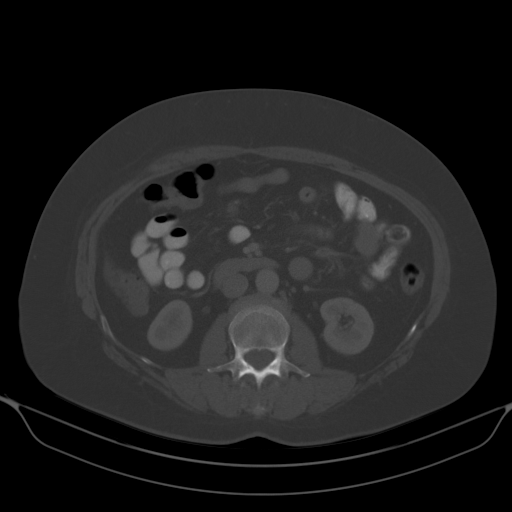
[im 72/99  soft-tissue]
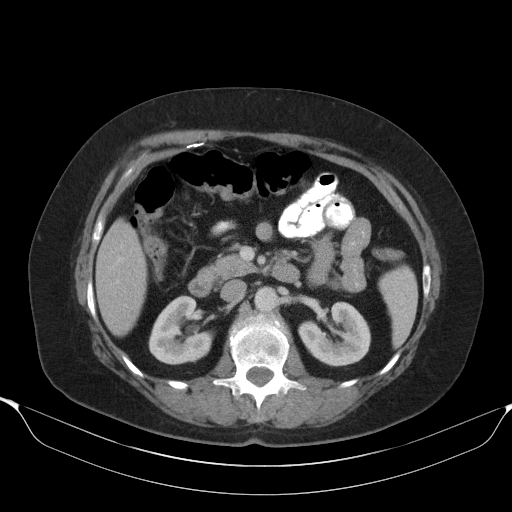
[im 72/99  lung]
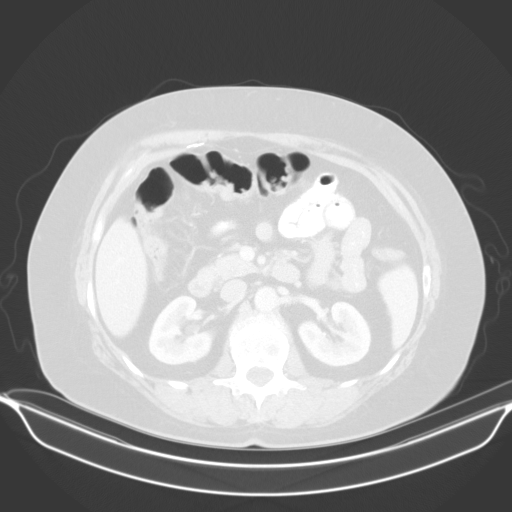
[im 79/99  soft-tissue]
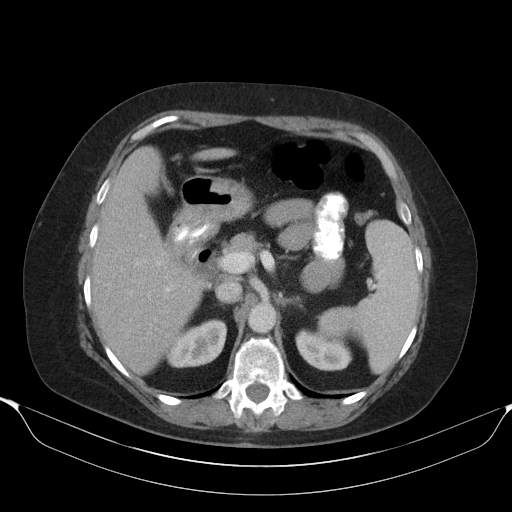
[im 79/99  lung]
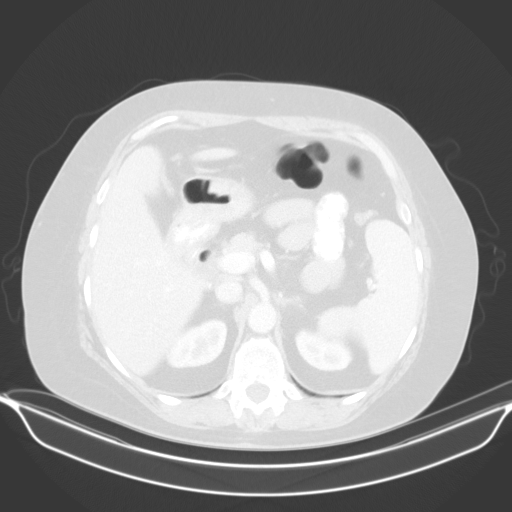
[im 85/99  soft-tissue]
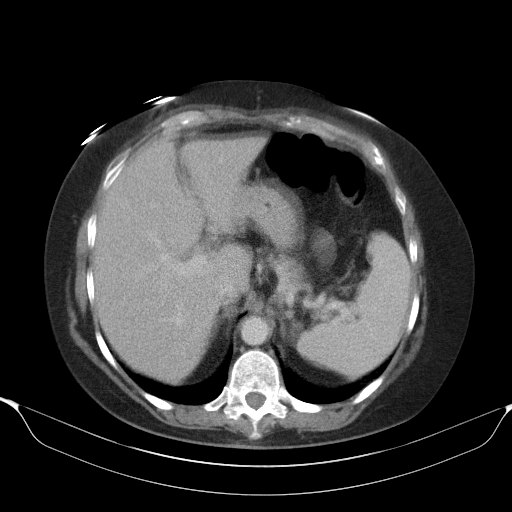
[im 85/99  lung]
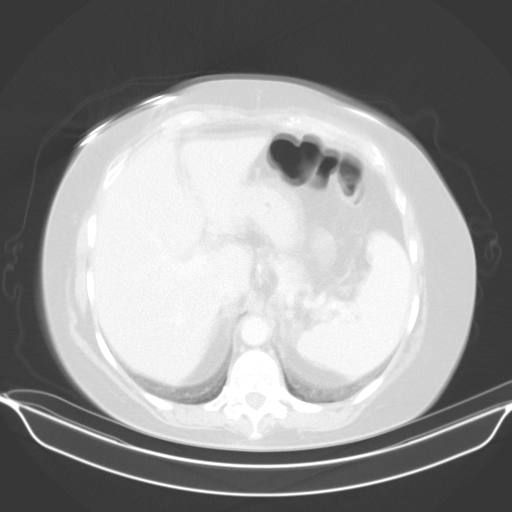
[im 92/99  soft-tissue]
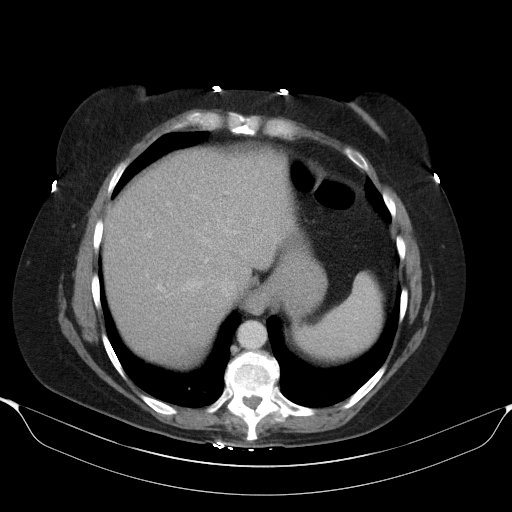
[im 92/99  lung]
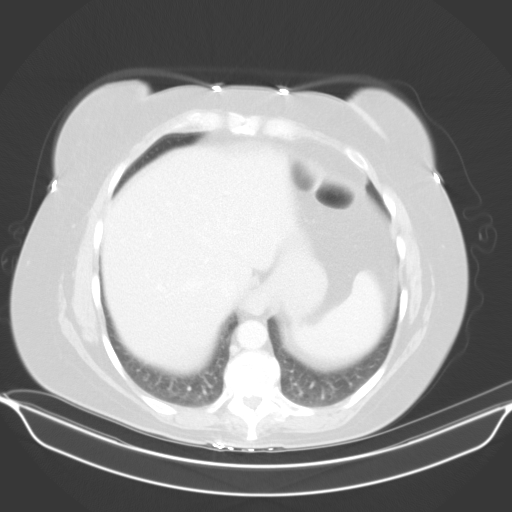

[13 of 32 positions shown; findings below may reference images not displayed]

FINDINGS: Lower chest: Motion degradation within the lower chest and upper
abdomen is mild. Clear lung bases. Normal heart size without
pericardial or pleural effusion. Tiny hiatal hernia.

Hepatobiliary: Normal liver. Cholecystectomy, without biliary ductal
dilatation.

Pancreas: Normal, without mass or ductal dilatation.

Spleen: Normal in size, without focal abnormality.

Adrenals/Urinary Tract: Normal adrenal glands. Normal kidneys,
without hydronephrosis. Normal urinary bladder.

Stomach/Bowel: The stomach is underdistended. Apparent proximal wall
thickening is at least partially secondary. Example [DATE]. Apparent
wall thickness of up to 2.5 cm on delayed image [DATE]. Normal colon
and terminal ileum. Normal small bowel.

Vascular/Lymphatic: Normal caliber of the aorta and branch vessels.
No abdominopelvic adenopathy.

Reproductive: Normal uterus and adnexa.

Other: No significant free fluid.

Musculoskeletal: No acute osseous abnormality. Degenerative disc
disease involves the lumbosacral junction. Disc bulge at L4-5.
IMPRESSION: 1. No acute process in the abdomen or pelvis.
2. Tiny hiatal hernia. Apparent proximal gastric wall thickening is
at least partially secondary to underdistention. Correlate with
symptoms to suggest gastritis or other gastric pathology. Consider
endoscopy.
3. Mild motion degradation within the lower chest and upper abdomen.

## 2020-05-23 MED ORDER — IOPAMIDOL (ISOVUE-300) INJECTION 61%
100.0000 mL | Freq: Once | INTRAVENOUS | Status: AC | PRN
  Administered 2020-05-23: 100 mL via INTRAVENOUS

## 2020-05-27 ENCOUNTER — Other Ambulatory Visit: Payer: Self-pay

## 2020-05-27 ENCOUNTER — Encounter: Payer: Self-pay | Admitting: Physician Assistant

## 2020-05-27 ENCOUNTER — Ambulatory Visit: Payer: BC Managed Care – PPO | Admitting: Physician Assistant

## 2020-05-27 VITALS — BP 118/66 | HR 80 | Ht 66.75 in | Wt 212.0 lb

## 2020-05-27 DIAGNOSIS — K449 Diaphragmatic hernia without obstruction or gangrene: Secondary | ICD-10-CM | POA: Insufficient documentation

## 2020-05-27 DIAGNOSIS — F900 Attention-deficit hyperactivity disorder, predominantly inattentive type: Secondary | ICD-10-CM | POA: Diagnosis not present

## 2020-05-27 DIAGNOSIS — R110 Nausea: Secondary | ICD-10-CM | POA: Insufficient documentation

## 2020-05-27 DIAGNOSIS — K2950 Unspecified chronic gastritis without bleeding: Secondary | ICD-10-CM | POA: Diagnosis not present

## 2020-05-27 MED ORDER — AMPHETAMINE-DEXTROAMPHET ER 30 MG PO CP24: 30.0000 mg | ORAL_CAPSULE | ORAL | 0 refills | Status: DC

## 2020-05-27 NOTE — Progress Notes (Signed)
Subjective:    Patient ID: Cynthia [name], female    DOB: 04/07/1961, 59 y.o.   MRN: [id number]  HPI  Patient is a 59 year old female with ADHD, hypertension, chronic left-sided low back pain, GERD who presents to the clinic for 10-month follow-up.  She was recently seen by gastroenterology for ongoing nausea. CT of the abdomen was done and found some inflammation of the stomach as well as hiatal hernia. She is scheduled to have an endoscopy.  She recently had to see Dr. Donn[facility] [place] sports medicine for her exacerbation of chronic left low back pain radiating into leg. She was started on Lyrica. She continues on Celebrex.  Her focus is doing well. She denies any problems or concerns. She is doing well at work although busy. She denies any increase in anxiety or insomnia due to medication. She denies any palpitations or chest pains or headaches.  .. Active Ambulatory Problems    Diagnosis Date Noted  . Iron deficiency anemia, unspecified 02/18/2013  . Interdigital neuralgia 04/14/2013  . 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 ADD (attention deficit disorder) 05/11/2014  . Chronic constipation 05/11/2014  . Essential (primary) hypertension 05/11/2014  . Gastro-esophageal reflux disease without esophagitis 05/11/2014  . Asthma, mild persistent 05/11/2014  . 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 Hypokalemia 02/13/2015  . 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 Hashimoto's thyroiditis 06/14/2015  . 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 Chronic left-sided low back pain 09/19/2016  . Fatty liver disease, nonalcoholic 99/37/1696  . Left renal mass 03/26/2017  . Obesity (BMI 30.0-34.9) 03/26/2017  . 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 Motion sickness 12/26/2017  . Family history of osteoporosis 03/05/2019  . Post-menopausal 03/05/2019  . Atrophic vaginitis 06/03/2019  . Basal cell carcinoma of left upper arm 10/21/2019  . Hearing loss of right  ear 11/24/2019  . Tremor of right hand 02/26/2020  . Chronic gastritis without bleeding 05/27/2020  . Hiatal hernia 05/27/2020  . Nausea 05/27/2020   Resolved Ambulatory Problems    Diagnosis Date Noted  . IFG (impaired fasting glucose) 09/13/2013  . Backache 09/13/2013  . Wheezing 09/13/2013  . Swelling of limb 09/13/2013  . Obesity, unspecified 09/13/2013  . Low back pain 05/11/2014  . Thyromegaly 02/13/2015  . Subconjunctival hemorrhage of right eye 06/28/2015  . Acoustic neuroma ([place]) 12/10/2014  . Left ear hearing loss 10/14/2015  . Costochondritis 03/21/2016  . Irritable 10/17/2016  . Thyroid adenoma 05/03/2015  . Exposure to influenza 05/07/2018  . Accidental fall 10/27/2018   Past Medical History:  Diagnosis Date  . Asthma   . Awareness under anesthesia   . Balance problem   . Benign brain tumor ([place])   . Chronic back pain   . Constipation, chronic   . Diabetes mellitus   . Difficult airway   . 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  . GERD (gastroesophageal reflux disease)   . Obesity   . Tendonitis, Achilles, left      Review of Systems  All other systems reviewed and are negative.      Objective:   Physical Exam Vitals reviewed.  Constitutional:      Appearance: Normal appearance. She is obese.  Cardiovascular:     Rate and Rhythm: Normal rate and regular rhythm.     Pulses: Normal pulses.  Pulmonary:     Effort: Pulmonary effort is normal.     Breath sounds: Normal breath sounds.  Neurological:     General: No  focal deficit present.     Mental Status: She is alert and oriented to person, place, and time.  Psychiatric:        Mood and Affect: Mood normal.           Assessment & Plan:  [name] KitchenMarland KitchenRoselinda was seen today for adhd.  Diagnoses and all orders for this visit:  Attention deficit hyperactivity disorder (ADHD), predominantly inattentive type -     amphetamine-dextroamphetamine (ADDERALL XR) 30 MG 24 hr capsule; Take 1 capsule (30 mg total) by mouth every  morning. -     amphetamine-dextroamphetamine (ADDERALL XR) 30 MG 24 hr capsule; Take 1 capsule (30 mg total) by mouth every morning. -     amphetamine-dextroamphetamine (ADDERALL XR) 30 MG 24 hr capsule; Take 1 capsule (30 mg total) by mouth every morning.  Nausea  Hiatal hernia  Chronic gastritis without bleeding, unspecified gastritis type   Refilled adderall. [place] for 6 month follow up. Pt has been well controlled for a while now.   Concern for NSAID induced gastritis. Consider stopping celebrex. Continue follow up with GI. Continue dexilant. Eat small meals.

## 2020-06-08 ENCOUNTER — Other Ambulatory Visit: Payer: Self-pay | Admitting: Physician Assistant

## 2020-06-08 DIAGNOSIS — E063 Autoimmune thyroiditis: Secondary | ICD-10-CM

## 2020-06-15 ENCOUNTER — Ambulatory Visit: Payer: BC Managed Care – PPO | Admitting: Sports Medicine

## 2020-06-15 ENCOUNTER — Other Ambulatory Visit: Payer: Self-pay

## 2020-06-15 DIAGNOSIS — G8929 Other chronic pain: Secondary | ICD-10-CM

## 2020-06-15 DIAGNOSIS — M545 Low back pain, unspecified: Secondary | ICD-10-CM

## 2020-06-15 NOTE — Progress Notes (Signed)
    Procedures performed today:    None.  Independent interpretation of notes and tests performed by another provider:   None.  Brief History, Exam, Impression, and Recommendations:    Chronic left-sided low back pain Cynthia [name] returns, she is a pleasant 59 year old female with a history of an L5-S1 microdiscectomy in the past, more recently increasing low back pain with radiation down the left leg, MRI earlier this year showed an L5-S1 degenerative disc disease with foraminal stenosis affecting the left L5 nerve root. She did not but better with prednisone, Lyrica has not been helpful so we will discontinue it, she did some home physical therapy, did not tolerate gabapentin in the past. As she is having persistent discomfort we are going to now proceed with a left L5-S1 transforaminal epidural with Dr. Laurian Brim. Return to see me 1 month after the injection.    ___________________________________________ Cynthia [name]. Cynthia [name], M.D., ABFM., CAQSM. Primary Care and Sports Medicine [facility]  Adjunct Instructor of Family Medicine  [employer]

## 2020-06-15 NOTE — Assessment & Plan Note (Signed)
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 She did not but better with prednisone, Lyrica has not been helpful so we will discontinue it, she did some home physical therapy, did not tolerate gabapentin in the past. As she is having persistent discomfort we are going to now proceed with a left L5-S1 transforaminal epidural with Dr. Laurian Brim. Return to see me 1 month after the injection.

## 2020-06-20 ENCOUNTER — Ambulatory Visit: Payer: BC Managed Care – PPO | Admitting: Sports Medicine

## 2020-06-22 ENCOUNTER — Other Ambulatory Visit: Payer: Self-pay | Admitting: Gastroenterology

## 2020-06-24 ENCOUNTER — Telehealth: Payer: Self-pay | Admitting: Sports Medicine

## 2020-06-24 DIAGNOSIS — G8929 Other chronic pain: Secondary | ICD-10-CM

## 2020-06-24 DIAGNOSIS — M545 Low back pain, unspecified: Secondary | ICD-10-CM

## 2020-06-24 NOTE — Telephone Encounter (Signed)
Dr. Ala Dach called stating she hadn't heard from Dr. Isabelle Course office so she called had to leave a message they finally called her back and stated they did not receive the referral. I refaxed it 3 times then yesterday she sat on hold for 45 minutes for them to tell her they have to review the referral before it can be scheduled.  She is requesting it be sent somewhere else she is in pain and would like to have her injections.. Please advise.   Jenny Reichmann

## 2020-06-24 NOTE — Telephone Encounter (Signed)
Dr. Francesco Runner called me and told me he would be out for medical reasons for several months, please contact [facility] to schedule her there.  Please also let [place] know that this is the case.

## 2020-06-24 NOTE — Telephone Encounter (Signed)
Left message for patient that Dr. Lyla Son was going to be out of the office for some time and we changed her referral to [place]. She should expect a call from [place] today or Monday about her injection.  Call to [facility] [place] to give patient information.

## 2020-06-29 ENCOUNTER — Ambulatory Visit
Admission: RE | Admit: 2020-06-29 | Discharge: 2020-06-29 | Disposition: A | Discharge status: Home / Self Care | Payer: BC Managed Care – PPO | Source: Ambulatory Visit | Attending: Sports Medicine | Admitting: Sports Medicine

## 2020-06-29 ENCOUNTER — Other Ambulatory Visit: Payer: Self-pay

## 2020-06-29 DIAGNOSIS — M545 Low back pain, unspecified: Secondary | ICD-10-CM | POA: Diagnosis not present

## 2020-06-29 DIAGNOSIS — G8929 Other chronic pain: Secondary | ICD-10-CM

## 2020-06-29 IMAGING — XA DG EPIDURAL NERVE ROOT
3 series · 3 of 3 positions shown · IV contrast (omnipaque)
Comparison: none

CLINICAL DATA: Previous disc surgery L5-S1. Low back and left lower
extremity pain.

EXAM:
SELECTIVE NERVE ROOT BLOCK AND TRANSFORAMINAL EPIDURAL STEROID
INJECTION UNDER FLUOROSCOPY
FLUOROSCOPY TIME:  29 seconds; 33 [Z8] DAP
TECHNIQUE: The procedure, risks (including but not limited to bleeding,
infection, organ damage ), benefits, and alternatives were explained
to the patient. Questions regarding the procedure were encouraged
and answered. The patient understands and consents to the procedure.
An appropriate skin entry site was determined under fluoroscopy.
Operator donned sterile gloves and mask. Site was marked, prepped
with Betadine, draped in usual sterile fashion, infiltrated locally
with 1% lidocaine. A 5-inch 22 gauge spinal needle was advanced to
the superior ventral margin of the left L5-S1 neural foramen,
confirmed in 3 projections. Diagnostic injection of 2 ml Omnipaque
180 showed partial outlining of the exiting nerve root as well as
epidural extension of contrast, with no intravascular or
subarachnoid component. 120 mg Depo-Medrol in 3 ml lidocaine 1% was
administered. The patient tolerated procedure well, with no
immediate complication.

[Series 1: ortho standard · 1 of 1 slices shown (1 of 3)]
[im 1/1]
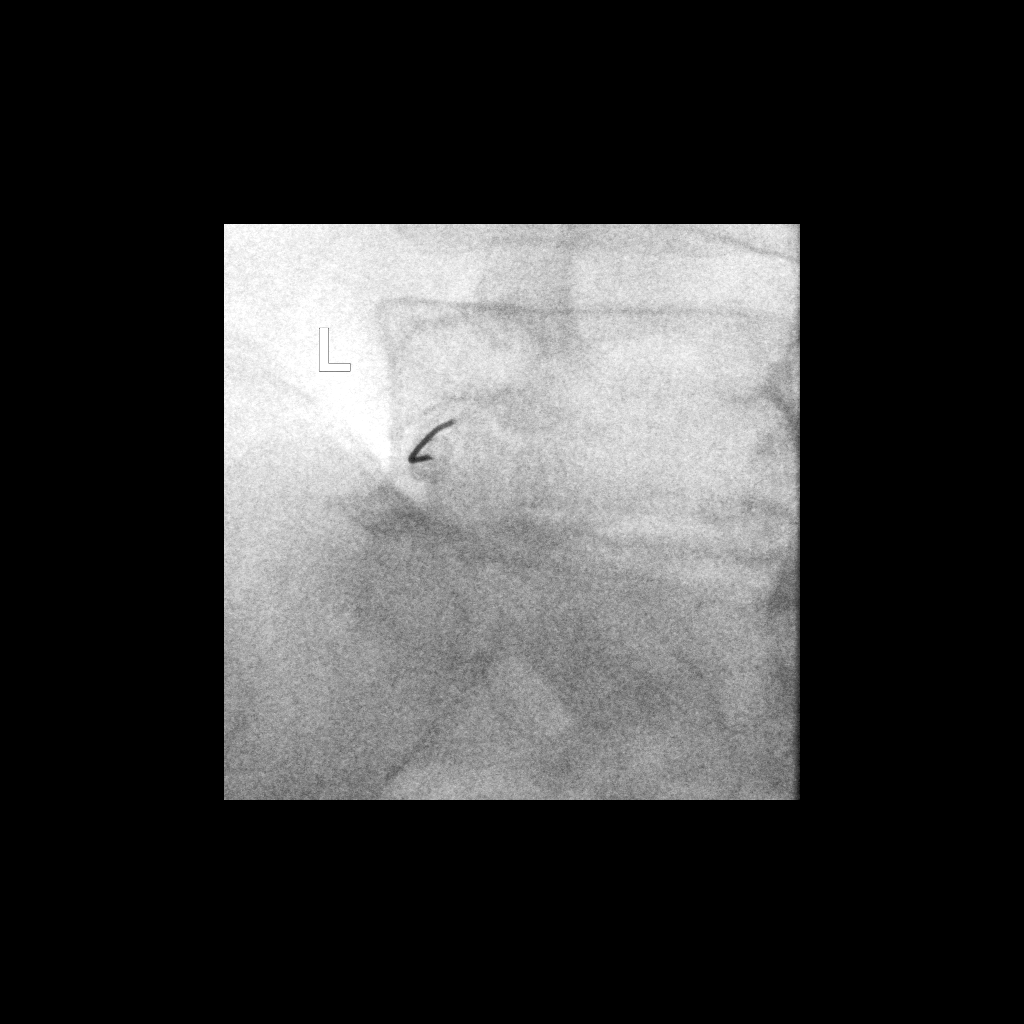

[Series 2: ortho standard · 1 of 1 slices shown (2 of 3)]
[im 1/1]
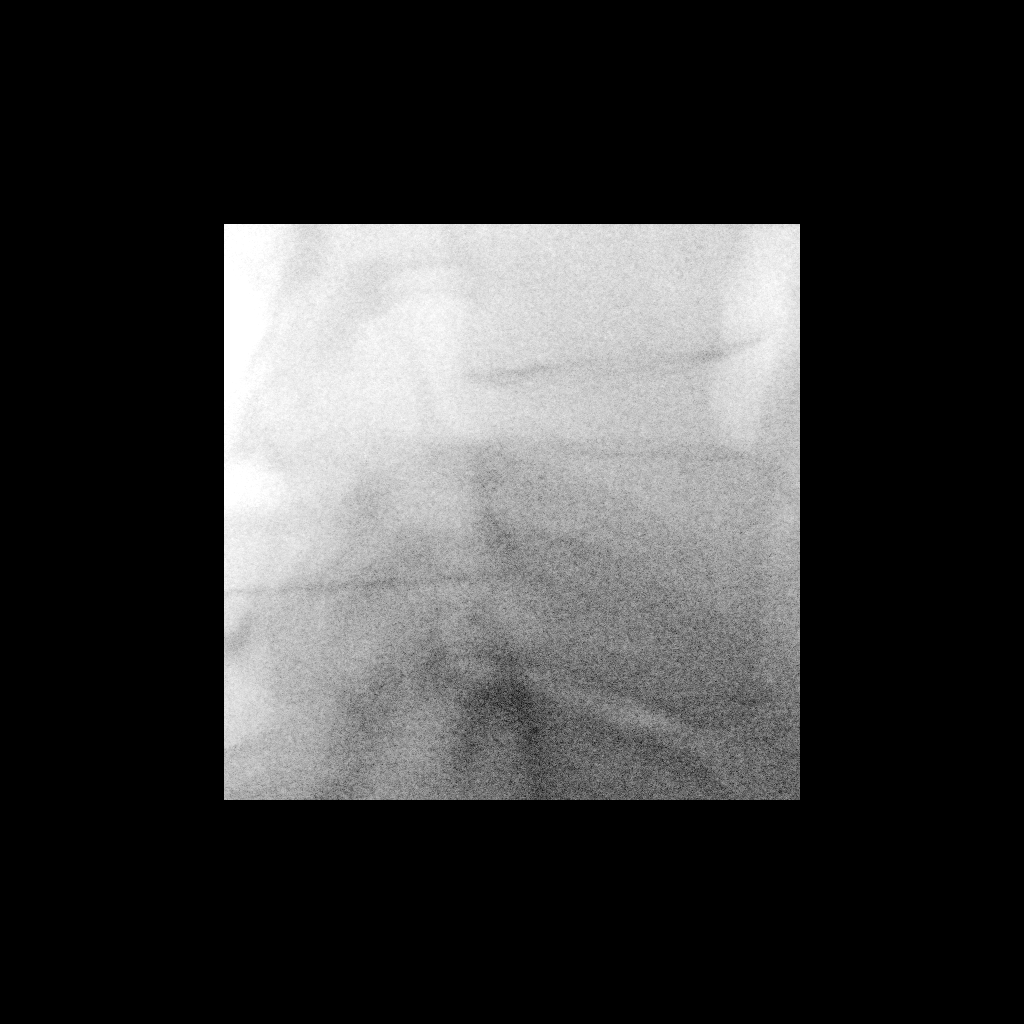

[Series 3: ortho standard · 1 of 1 slices shown (3 of 3)]
[im 1/1]
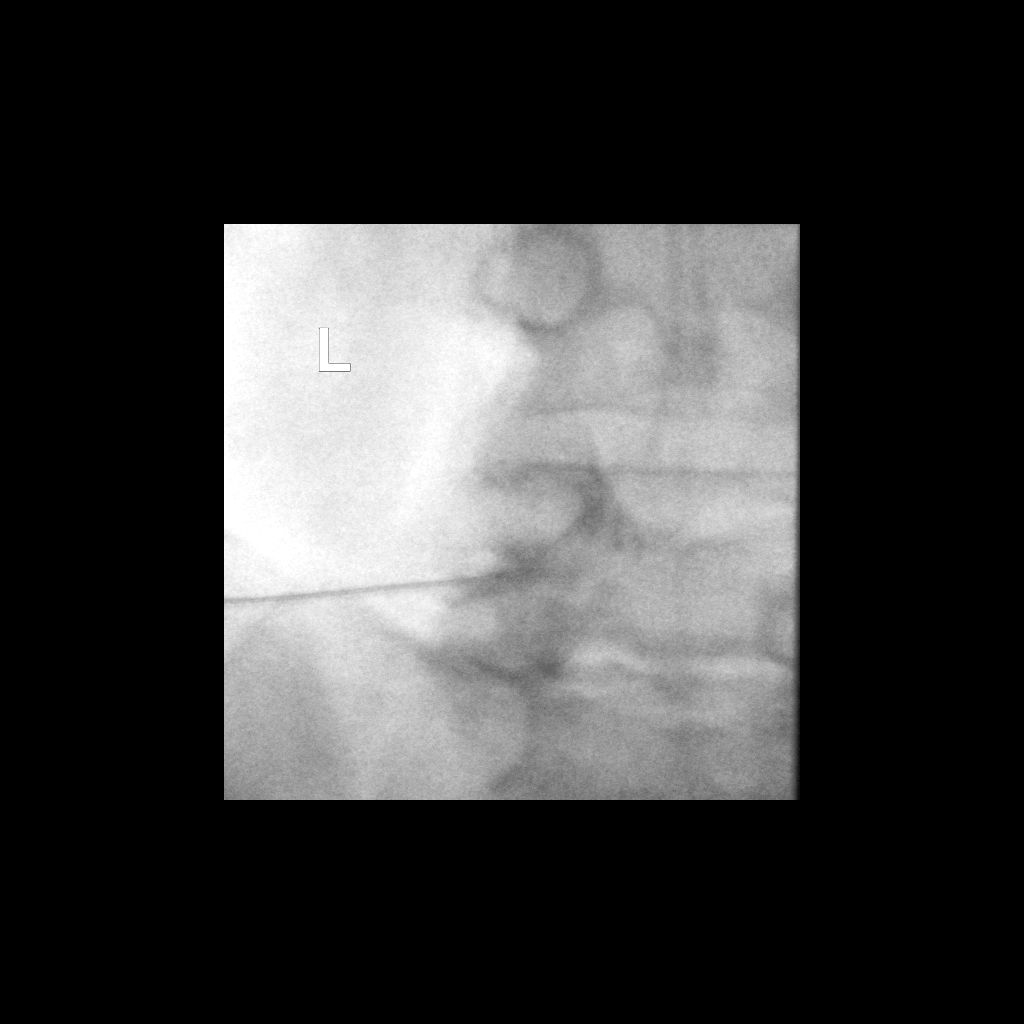

[3 of 3 positions shown; findings below may reference images not displayed]

IMPRESSION: 1. Technically successful left L5 selective nerve root block and
transforaminal epidural steroid injection

## 2020-06-29 MED ORDER — METHYLPREDNISOLONE ACETATE 40 MG/ML INJ SUSP (RADIOLOG
120.0000 mg | Freq: Once | INTRAMUSCULAR | Status: AC
  Administered 2020-06-29: 120 mg via EPIDURAL

## 2020-06-29 MED ORDER — IOPAMIDOL (ISOVUE-M 200) INJECTION 41%
1.0000 mL | Freq: Once | INTRAMUSCULAR | Status: AC
  Administered 2020-06-29: 1 mL via EPIDURAL

## 2020-06-29 NOTE — Discharge Instructions (Signed)

## 2020-07-13 ENCOUNTER — Ambulatory Visit: Payer: BC Managed Care – PPO | Admitting: Sports Medicine

## 2020-07-19 ENCOUNTER — Other Ambulatory Visit (HOSPITAL_COMMUNITY)
Admission: RE | Admit: 2020-07-19 | Discharge: 2020-07-19 | Disposition: A | Discharge status: Home / Self Care | Payer: BC Managed Care – PPO | Source: Ambulatory Visit | Attending: Gastroenterology | Admitting: Gastroenterology

## 2020-07-19 DIAGNOSIS — Z20822 Contact with and (suspected) exposure to covid-19: Secondary | ICD-10-CM | POA: Insufficient documentation

## 2020-07-19 DIAGNOSIS — K449 Diaphragmatic hernia without obstruction or gangrene: Secondary | ICD-10-CM | POA: Diagnosis not present

## 2020-07-19 DIAGNOSIS — Z8349 Family history of other endocrine, nutritional and metabolic diseases: Secondary | ICD-10-CM | POA: Diagnosis not present

## 2020-07-19 DIAGNOSIS — E119 Type 2 diabetes mellitus without complications: Secondary | ICD-10-CM | POA: Diagnosis not present

## 2020-07-19 DIAGNOSIS — Z01812 Encounter for preprocedural laboratory examination: Secondary | ICD-10-CM | POA: Insufficient documentation

## 2020-07-19 DIAGNOSIS — Z800 Family history of malignant neoplasm of digestive organs: Secondary | ICD-10-CM | POA: Diagnosis not present

## 2020-07-19 DIAGNOSIS — Z8249 Family history of ischemic heart disease and other diseases of the circulatory system: Secondary | ICD-10-CM | POA: Diagnosis not present

## 2020-07-19 DIAGNOSIS — Z801 Family history of malignant neoplasm of trachea, bronchus and lung: Secondary | ICD-10-CM | POA: Diagnosis not present

## 2020-07-19 DIAGNOSIS — R933 Abnormal findings on diagnostic imaging of other parts of digestive tract: Secondary | ICD-10-CM | POA: Diagnosis not present

## 2020-07-19 DIAGNOSIS — Z888 Allergy status to other drugs, medicaments and biological substances status: Secondary | ICD-10-CM | POA: Diagnosis not present

## 2020-07-19 LAB — SARS CORONAVIRUS 2 (TAT 6-24 HRS): SARS Coronavirus 2: NEGATIVE

## 2020-07-21 NOTE — Anesthesia Preprocedure Evaluation (Addendum)
Anesthesia Evaluation  Patient identified by MRN, date of birth, ID band Patient awake    Reviewed: Allergy & Precautions, NPO status , Patient's Chart, lab work & pertinent test results  History of Anesthesia Complications (+) DIFFICULT AIRWAY  Airway Mallampati: II  TM Distance: >3 FB Neck ROM: Full   Comment: Hx of difficult airway Dental no notable dental hx. (+) Teeth Intact, Dental Advisory Given, Implants   Pulmonary asthma ,  COPD inhaler,    Pulmonary exam normal breath sounds clear to auscultation       Cardiovascular hypertension, Normal cardiovascular exam Rhythm:Regular Rate:Normal     Neuro/Psych negative neurological ROS     GI/Hepatic Neg liver ROS,   Endo/Other  diabetesHypothyroidism   Renal/GU negative Renal ROS     Musculoskeletal negative musculoskeletal ROS (+)   Abdominal   Peds  Hematology   Anesthesia Other Findings   Reproductive/Obstetrics                            Anesthesia Physical Anesthesia Plan  ASA: II  Anesthesia Plan: MAC   Post-op Pain Management:    Induction:   PONV Risk Score and Plan:   Airway Management Planned: Nasal Cannula and Natural Airway  Additional Equipment: None  Intra-op Plan:   Post-operative Plan:   Informed Consent: I have reviewed the patients History and Physical, chart, labs and discussed the procedure including the risks, benefits and alternatives for the proposed anesthesia with the patient or authorized representative who has indicated his/her understanding and acceptance.     Dental advisory given  Plan Discussed with: CRNA and Anesthesiologist  Anesthesia Plan Comments: (EGD for abnormal CT and Gastric wall thickening)       Anesthesia Quick Evaluation

## 2020-07-22 ENCOUNTER — Ambulatory Visit (HOSPITAL_COMMUNITY): Payer: BC Managed Care – PPO | Admitting: Anesthesiology

## 2020-07-22 ENCOUNTER — Encounter (HOSPITAL_COMMUNITY): Payer: Self-pay | Admitting: Gastroenterology

## 2020-07-22 ENCOUNTER — Ambulatory Visit (HOSPITAL_COMMUNITY)
Admission: RE | Admit: 2020-07-22 | Discharge: 2020-07-22 | Disposition: A | Discharge status: Home / Self Care | Payer: BC Managed Care – PPO | Attending: Gastroenterology | Admitting: Gastroenterology

## 2020-07-22 ENCOUNTER — Encounter (HOSPITAL_COMMUNITY)
Admission: RE | Disposition: A | Discharge status: Home / Self Care | Payer: Self-pay | Source: Home / Self Care | Attending: Gastroenterology

## 2020-07-22 ENCOUNTER — Other Ambulatory Visit: Payer: Self-pay

## 2020-07-22 DIAGNOSIS — Z8249 Family history of ischemic heart disease and other diseases of the circulatory system: Secondary | ICD-10-CM | POA: Insufficient documentation

## 2020-07-22 DIAGNOSIS — Z800 Family history of malignant neoplasm of digestive organs: Secondary | ICD-10-CM | POA: Diagnosis not present

## 2020-07-22 DIAGNOSIS — Z20822 Contact with and (suspected) exposure to covid-19: Secondary | ICD-10-CM | POA: Diagnosis not present

## 2020-07-22 DIAGNOSIS — Z888 Allergy status to other drugs, medicaments and biological substances status: Secondary | ICD-10-CM | POA: Diagnosis not present

## 2020-07-22 DIAGNOSIS — E876 Hypokalemia: Secondary | ICD-10-CM | POA: Diagnosis not present

## 2020-07-22 DIAGNOSIS — Z8349 Family history of other endocrine, nutritional and metabolic diseases: Secondary | ICD-10-CM | POA: Insufficient documentation

## 2020-07-22 DIAGNOSIS — R933 Abnormal findings on diagnostic imaging of other parts of digestive tract: Secondary | ICD-10-CM | POA: Insufficient documentation

## 2020-07-22 DIAGNOSIS — K449 Diaphragmatic hernia without obstruction or gangrene: Secondary | ICD-10-CM | POA: Insufficient documentation

## 2020-07-22 DIAGNOSIS — Z801 Family history of malignant neoplasm of trachea, bronchus and lung: Secondary | ICD-10-CM | POA: Insufficient documentation

## 2020-07-22 DIAGNOSIS — J449 Chronic obstructive pulmonary disease, unspecified: Secondary | ICD-10-CM | POA: Diagnosis not present

## 2020-07-22 DIAGNOSIS — E119 Type 2 diabetes mellitus without complications: Secondary | ICD-10-CM | POA: Diagnosis not present

## 2020-07-22 DIAGNOSIS — D509 Iron deficiency anemia, unspecified: Secondary | ICD-10-CM | POA: Diagnosis not present

## 2020-07-22 HISTORY — DX: Other complications of anesthesia, initial encounter: T88.59XA

## 2020-07-22 HISTORY — PX: ESOPHAGOGASTRODUODENOSCOPY (EGD) WITH PROPOFOL: SHX5813

## 2020-07-22 SURGERY — ESOPHAGOGASTRODUODENOSCOPY (EGD) WITH PROPOFOL
Anesthesia: Monitor Anesthesia Care

## 2020-07-22 MED ORDER — PROPOFOL 500 MG/50ML IV EMUL
INTRAVENOUS | Status: DC | PRN
  Administered 2020-07-22: 125 ug/kg/min via INTRAVENOUS

## 2020-07-22 MED ORDER — LIDOCAINE 2% (20 MG/ML) 5 ML SYRINGE
INTRAMUSCULAR | Status: DC | PRN
  Administered 2020-07-22: 100 mg via INTRAVENOUS

## 2020-07-22 MED ORDER — LACTATED RINGERS IV SOLN: INTRAVENOUS | Status: DC

## 2020-07-22 MED ORDER — PROPOFOL 10 MG/ML IV BOLUS
INTRAVENOUS | Status: DC | PRN
  Administered 2020-07-22 (×3): 20 mg via INTRAVENOUS

## 2020-07-22 MED ORDER — SODIUM CHLORIDE 0.9 % IV SOLN: INTRAVENOUS | Status: DC

## 2020-07-22 MED ORDER — PROPOFOL 500 MG/50ML IV EMUL
INTRAVENOUS | Status: AC
  Filled 2020-07-22: qty 50

## 2020-07-22 SURGICAL SUPPLY — 14 items

## 2020-07-22 NOTE — Op Note (Signed)
[facility] Patient Name: Cynthia [name] Procedure Date: 07/22/2020 MRN: [id number] Attending MD: Carol Ada , MD Date of Birth: [birth year] CSN: [id number] Age: 60 Admit Type: Outpatient Procedure:                Upper GI endoscopy Indications:              Abnormal CT of the GI tract Providers:                Carol Ada, MD, Doristine Johns, RN, Cletis [place], Technician Referring MD:              Medicines:                Propofol per Anesthesia Complications:            No immediate complications. Estimated Blood Loss:     Estimated blood loss: none. Procedure:                Pre-Anesthesia Assessment:                           - Prior to the procedure, a History and Physical                            was performed, and patient medications and                            allergies were reviewed. The patient's tolerance of                            previous anesthesia was also reviewed. The risks                            and benefits of the procedure and the sedation                            options and risks were discussed with the patient.                            All questions were answered, and informed consent                            was obtained. Prior Anticoagulants: The patient has                            taken no previous anticoagulant or antiplatelet                            agents. ASA Grade Assessment: III - A patient with                            severe systemic disease. After reviewing the risks  and benefits, the patient was deemed in                            satisfactory condition to undergo the procedure.                           - Sedation was administered by an anesthesia                            professional. Deep sedation was attained.                           After obtaining informed consent, the endoscope was                            passed under direct vision.  Throughout the                            procedure, the patient's blood pressure, pulse, and                            oxygen saturations were monitored continuously. The                            [id number]-[id number]) Olympus gastroscope was                            introduced through the mouth, and advanced to the                            second part of duodenum. The upper GI endoscopy was                            accomplished without difficulty. The patient                            tolerated the procedure well. Scope In: Scope Out: Findings:      A 2 cm hiatal hernia was present.      The esophagus was normal.      The stomach was normal.      The examined duodenum was normal. Impression:               - 2 cm hiatal hernia.                           - Normal esophagus.                           - Normal stomach.                           - Normal examined duodenum.                           - No specimens collected. Moderate Sedation:      Not Applicable - Patient had  care per Anesthesia. Recommendation:           - Patient has a contact number available for                            emergencies. The signs and symptoms of potential                            delayed complications were discussed with the                            patient. Return to normal activities tomorrow.                            Written discharge instructions were provided to the                            patient.                           - Resume previous diet.                           - Continue present medications.                           - Return to GI clinic in 4 weeks with Dr. Collene Mares. Procedure Code(s):        --- Professional ---                           [phone], Esophagogastroduodenoscopy, flexible,                            transoral; diagnostic, including collection of                            specimen(s) by brushing or washing, when performed                            (separate  procedure) Diagnosis Code(s):        --- Professional ---                           R93.3, Abnormal findings on diagnostic imaging of                            other parts of digestive tract                           K44.9, Diaphragmatic hernia without obstruction or                            gangrene CPT copyright 2019 American Medical Association. All rights reserved. The codes documented in this report are preliminary and upon coder review may  be revised to meet current compliance requirements. Carol Ada, MD Carol Ada, MD 07/22/2020 10:27:20 AM This report has been signed electronically.  Number of Addenda: 0 

## 2020-07-22 NOTE — Anesthesia Procedure Notes (Signed)
Date/Time: 07/22/2020 10:15 AM Performed by: Sharlette Dense, CRNA Oxygen Delivery Method: Simple face mask

## 2020-07-22 NOTE — Anesthesia Postprocedure Evaluation (Signed)
Anesthesia Post Note  Patient: Cynthia [name]  Procedure(s) Performed: ESOPHAGOGASTRODUODENOSCOPY (EGD) WITH PROPOFOL (N/A )     Patient location during evaluation: Endoscopy Anesthesia Type: MAC Level of consciousness: awake and alert Pain management: pain level controlled Vital Signs Assessment: post-procedure vital signs reviewed and stable Respiratory status: spontaneous breathing, nonlabored ventilation, respiratory function stable and patient connected to nasal cannula oxygen Cardiovascular status: blood pressure returned to baseline and stable Postop Assessment: no apparent nausea or vomiting Anesthetic complications: no   No complications documented.  Last Vitals:  Vitals:   07/22/20 1031 07/22/20 1040  BP: 123/66   Pulse: 71 81  Resp: 16 (!) 27  Temp:    SpO2:  99%    Last Pain:  Vitals:   07/22/20 1030  TempSrc: Axillary  PainSc:                  [place]

## 2020-07-22 NOTE — H&P (Signed)
  Cynthia [name]   HPI: A CT scan of the ABD on 05/23/2020 for complaints of LUQ pain showed a gastric wall thickening. The thickening was measured to be 2.5 cm. It was not clear if the thickening was secondary to under distension.    Past Medical History:  Diagnosis Date  . Acoustic neuroma ([place])   . ADD (attention deficit disorder)    and OCD-on Vyvanse  . Asthma    w/out status asthmaticus  . Awareness under anesthesia   . Balance problem    after acoustic neuroma excision  . Benign brain tumor ([place])   . Chronic back pain   . Constipation, chronic    IBS, CIC  . Diabetes mellitus   . Difficult airway   . 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 GERD (gastroesophageal reflux disease)   . Iron deficiency anemia, unspecified 02/18/2013  . Obesity    296 lbs (highest weight in 2012)   . Tendonitis, Achilles, left     Past Surgical History:  Procedure Laterality Date  . ABLATION     and bladder sling  . ACHILLES TENDON REPAIR     x2  . ANAL FISSURE REPAIR  2012  . BACK SURGERY    . CHOLECYSTECTOMY    . CRANIOTOMY  09/13/2015   with excision of acoustic neuroma  . IMPLANTATION BONE ANCHORED HEARING AID    . MOUTH SURGERY  2015   3 implants  . OVARIAN CYST REMOVAL     x4  . THYROID LOBECTOMY Right 04/28/2015   Partial   . TOE FUSION Left     Family History  Problem Relation Age of Onset  . Thyroid disease Mother   . Heart Problems Mother        vavle issue  . Dystonia Mother   . Hypercholesterolemia Mother   . Heart attack Father   . Cancer Father        esophageal  . Heart disease Father   . Lung cancer Paternal Grandfather   . Heart disease Paternal Grandfather   . Heart disease Maternal Grandfather   . Heart disease Paternal Grandmother   . Heart disease Other        paternal and maternal side  . Cancer Brother        esophageal/stomach cancer    Social History:  reports that she has never smoked. She has never used smokeless tobacco. She  reports current alcohol use of about 1.0 - 2.0 standard drink of alcohol per week. She reports that she does not use drugs.  Allergies:  Allergies  Allergen Reactions  . Mobic [Meloxicam] Nausea And Vomiting  . Multivitamins Other (See Comments)    Medications:  Scheduled:  Continuous: . sodium chloride    . lactated ringers      No results found for this or any previous visit (from the past 24 hour(s)).   No results found.  ROS:  As stated above in the HPI otherwise negative.  There were no vitals taken for this visit.    PE: Gen: NAD, Alert and Oriented HEENT:  [place]/AT, EOMI Neck: Supple, no LAD Lungs: CTA Bilaterally CV: RRR without M/G/R ABD: Soft, NTND, +BS Ext: No C/C/E  Assessment/Plan: 1) Abnormal CT scan - EGD  Jasper Ruminski D 07/22/2020, 9:33 AM

## 2020-07-22 NOTE — Transfer of Care (Signed)
Immediate Anesthesia Transfer of Care Note  Patient: Cynthia [name]  Procedure(s) Performed: ESOPHAGOGASTRODUODENOSCOPY (EGD) WITH PROPOFOL (N/A )  Patient Location: Endoscopy Unit  Anesthesia Type:MAC  Level of Consciousness: awake and alert   Airway & Oxygen Therapy: Patient Spontanous Breathing and Patient connected to face mask oxygen  Post-op Assessment: Report given to RN and Post -op Vital signs reviewed and stable  Post vital signs: Reviewed and stable  Last Vitals:  Vitals Value Taken Time  BP    Temp    Pulse    Resp    SpO2      Last Pain:  Vitals:   07/22/20 0941  TempSrc: Oral  PainSc: 0-No pain         Complications: No complications documented.

## 2020-07-22 NOTE — Discharge Instructions (Signed)
YOU HAD AN ENDOSCOPIC PROCEDURE TODAY: Refer to the procedure report and other information in the discharge instructions given to you for any specific questions about what was found during the examination. If this information does not answer your questions, please call [facility] at [phone] to clarify.   YOU SHOULD EXPECT: Some feelings of bloating in the abdomen. Passage of more gas than usual. Walking can help get rid of the air that was put into your GI tract during the procedure and reduce the bloating. If you had a lower endoscopy (such as a colonoscopy or flexible sigmoidoscopy) you may notice spotting of blood in your stool or on the toilet paper. Some abdominal soreness may be present for a day or two, also.  DIET: Your first meal following the procedure should be a light meal and then it is ok to progress to your normal diet. A half-sandwich or bowl of soup is an example of a good first meal. Heavy or fried foods are harder to digest and may make you feel nauseous or bloated. Drink plenty of fluids but you should avoid alcoholic beverages for 24 hours. If you had an esophageal dilation, please see attached information for diet.   ACTIVITY: Your care partner should take you home directly after the procedure. You should plan to take it easy, moving slowly for the rest of the day. You can resume normal activity the day after the procedure however YOU SHOULD NOT DRIVE, use power tools, machinery or perform tasks that involve climbing or major physical exertion for 24 hours (because of the sedation medicines used during the test).   SYMPTOMS TO REPORT IMMEDIATELY: A gastroenterologist can be reached at any hour. Please call [phone]  for any of the following symptoms:  Following lower endoscopy (colonoscopy, flexible sigmoidoscopy) Excessive amounts of blood in the stool  Significant tenderness, worsening of abdominal pains  Swelling of the abdomen that is new, acute  Fever of  100 or higher  Following upper endoscopy (EGD, EUS, ERCP, esophageal dilation) Vomiting of blood or coffee ground material  New, significant abdominal pain  New, significant chest pain or pain under the shoulder blades  Painful or persistently difficult swallowing  New shortness of breath  Black, tarry-looking or red, bloody stools  FOLLOW UP:  If any biopsies were taken you will be contacted by phone or by letter within the next 1-3 weeks. Call [phone]  if you have not heard about the biopsies in 3 weeks.  Please also call with any specific questions about appointments or follow up tests. YOU HAD AN ENDOSCOPIC PROCEDURE TODAY: Refer to the procedure report and other information in the discharge instructions given to you for any specific questions about what was found during the examination. If this information does not answer your questions, please call [facility] at [phone] to clarify.   YOU SHOULD EXPECT: Some feelings of bloating in the abdomen. Passage of more gas than usual. Walking can help get rid of the air that was put into your GI tract during the procedure and reduce the bloating. If you had a lower endoscopy (such as a colonoscopy or flexible sigmoidoscopy) you may notice spotting of blood in your stool or on the toilet paper. Some abdominal soreness may be present for a day or two, also.  DIET: Your first meal following the procedure should be a light meal and then it is ok to progress to your normal diet. A half-sandwich or bowl of soup is an example   of a good first meal. Heavy or fried foods are harder to digest and may make you feel nauseous or bloated. Drink plenty of fluids but you should avoid alcoholic beverages for 24 hours. If you had an esophageal dilation, please see attached information for diet.   ACTIVITY: Your care partner should take you home directly after the procedure. You should plan to take it easy, moving slowly for the rest of the day. You  can resume normal activity the day after the procedure however YOU SHOULD NOT DRIVE, use power tools, machinery or perform tasks that involve climbing or major physical exertion for 24 hours (because of the sedation medicines used during the test).   SYMPTOMS TO REPORT IMMEDIATELY: A gastroenterologist can be reached at any hour. Please call [phone]  for any of the following symptoms:  Following lower endoscopy (colonoscopy, flexible sigmoidoscopy) Excessive amounts of blood in the stool  Significant tenderness, worsening of abdominal pains  Swelling of the abdomen that is new, acute  Fever of 100 or higher  Following upper endoscopy (EGD, EUS, ERCP, esophageal dilation) Vomiting of blood or coffee ground material  New, significant abdominal pain  New, significant chest pain or pain under the shoulder blades  Painful or persistently difficult swallowing  New shortness of breath  Black, tarry-looking or red, bloody stools  FOLLOW UP:  If any biopsies were taken you will be contacted by phone or by letter within the next 1-3 weeks. Call [phone]  if you have not heard about the biopsies in 3 weeks.  Please also call with any specific questions about appointments or follow up tests.  

## 2020-07-26 ENCOUNTER — Other Ambulatory Visit: Payer: Self-pay

## 2020-07-26 ENCOUNTER — Ambulatory Visit (INDEPENDENT_AMBULATORY_CARE_PROVIDER_SITE_OTHER): Payer: BC Managed Care – PPO | Admitting: Sports Medicine

## 2020-07-26 DIAGNOSIS — M545 Low back pain, unspecified: Secondary | ICD-10-CM | POA: Diagnosis not present

## 2020-07-26 DIAGNOSIS — G8929 Other chronic pain: Secondary | ICD-10-CM | POA: Diagnosis not present

## 2020-07-26 NOTE — Progress Notes (Signed)
    Procedures performed today:    None.  Independent interpretation of notes and tests performed by another provider:   None.  Brief History, Exam, Impression, and Recommendations:    Chronic left-sided low back pain This is a pleasant 60 year old female post L5-S1 microdiscectomy, recent MRI showed L5-S1 DDD with foraminal stenosis affecting the left L5 nerve root, she did have a left L5-S1 transforaminal epidural that provided essentially complete relief. Return as needed. Of note she did have some periscapular pain so I added some cervical spine rehab exercises.     ___________________________________________ Gwen Her. Dianah Field, M.D., ABFM., CAQSM. Primary Care and [place] Instructor of [place] of [facility] of Medicine

## 2020-07-26 NOTE — Assessment & Plan Note (Signed)
This is a pleasant 60 year old female post L5-S1 microdiscectomy, recent MRI showed L5-S1 DDD with foraminal stenosis affecting the left L5 nerve root, she did have a left L5-S1 transforaminal epidural that provided essentially complete relief. Return as needed. Of note she did have some periscapular pain so I added some cervical spine rehab exercises.

## 2020-08-22 DIAGNOSIS — D2271 Melanocytic nevi of right lower limb, including hip: Secondary | ICD-10-CM | POA: Diagnosis not present

## 2020-08-22 DIAGNOSIS — D229 Melanocytic nevi, unspecified: Secondary | ICD-10-CM | POA: Diagnosis not present

## 2020-08-22 DIAGNOSIS — L718 Other rosacea: Secondary | ICD-10-CM | POA: Diagnosis not present

## 2020-08-22 DIAGNOSIS — K130 Diseases of lips: Secondary | ICD-10-CM | POA: Diagnosis not present

## 2020-09-01 DIAGNOSIS — D333 Benign neoplasm of cranial nerves: Secondary | ICD-10-CM | POA: Diagnosis not present

## 2020-09-02 DIAGNOSIS — R29818 Other symptoms and signs involving the nervous system: Secondary | ICD-10-CM | POA: Diagnosis not present

## 2020-09-02 DIAGNOSIS — D333 Benign neoplasm of cranial nerves: Secondary | ICD-10-CM | POA: Diagnosis not present

## 2020-09-02 DIAGNOSIS — H903 Sensorineural hearing loss, bilateral: Secondary | ICD-10-CM | POA: Diagnosis not present

## 2020-09-08 ENCOUNTER — Other Ambulatory Visit: Payer: Self-pay | Admitting: Neurology

## 2020-09-08 DIAGNOSIS — F900 Attention-deficit hyperactivity disorder, predominantly inattentive type: Secondary | ICD-10-CM

## 2020-09-08 NOTE — Telephone Encounter (Signed)
Patient left vm stating she was told to call when time for Adderall refill, that she only needed appt every 6 months, not every 3 months.  Last visit 05/27/2020. Note states the same. Adderall pended.

## 2020-09-09 MED ORDER — AMPHETAMINE-DEXTROAMPHET ER 30 MG PO CP24: 30.0000 mg | ORAL_CAPSULE | Freq: Every day | ORAL | 0 refills | Status: DC

## 2020-09-09 MED ORDER — AMPHETAMINE-DEXTROAMPHET ER 30 MG PO CP24: 30.0000 mg | ORAL_CAPSULE | ORAL | 0 refills | Status: DC

## 2020-09-09 NOTE — Telephone Encounter (Signed)
LMOM letting patient know prescriptions have been sent to pharmacy.

## 2020-09-13 ENCOUNTER — Other Ambulatory Visit: Payer: Self-pay

## 2020-09-13 ENCOUNTER — Ambulatory Visit: Payer: BC Managed Care – PPO | Admitting: Physician Assistant

## 2020-09-13 VITALS — BP 133/61 | HR 81 | Ht 66.75 in | Wt 212.0 lb

## 2020-09-13 DIAGNOSIS — G8929 Other chronic pain: Secondary | ICD-10-CM | POA: Diagnosis not present

## 2020-09-13 DIAGNOSIS — M545 Low back pain, unspecified: Secondary | ICD-10-CM | POA: Diagnosis not present

## 2020-09-13 MED ORDER — PREDNISONE 50 MG PO TABS: 50.0000 mg | ORAL_TABLET | Freq: Every day | ORAL | 0 refills | Status: DC

## 2020-09-13 MED ORDER — KETOROLAC TROMETHAMINE 60 MG/2ML IM SOLN
60.0000 mg | Freq: Once | INTRAMUSCULAR | Status: AC
  Administered 2020-09-13: 60 mg via INTRAMUSCULAR

## 2020-09-13 MED ORDER — TRAMADOL HCL 50 MG PO TABS: 50.0000 mg | ORAL_TABLET | Freq: Four times a day (QID) | ORAL | 0 refills | Status: AC | PRN

## 2020-09-13 NOTE — Patient Instructions (Signed)
Take prednisone daily for 5 days and tramadol as needed for pain.  Continue conservative management. Follow up with Dr. Darene Lamer.

## 2020-09-13 NOTE — Progress Notes (Signed)
Subjective:    Patient ID: Cynthia [name], female    DOB: 08/10/1960, 60 y.o.   MRN: [id number]  HPI  Pt is a 60 yo female with chronic left sided low back pain due to L5/S1 stenosis and degeneration with previous microdiscectomy.  Epidural injection on 06/29/2020 gave her a lot of relief and resolved any radicular pain. She has been doing her exercises daily and yesterday bent down to pick something up and "felt like her low back tore".  She is in a lot of pain and feels like her back is catching. Prednisone has been helpful in the past. lyrica and gabapentin have not. She has flexeril but only used at bedtime. No radicular symptoms, saddle anesthesia, bladder or bowel dysfunction. She is using tens unit, ice, celebrex. She could not get in with Cynthia [name] today.  .. Active Ambulatory Problems    Diagnosis Date Noted  . Iron deficiency anemia, unspecified 02/18/2013  . Interdigital neuralgia 04/14/2013  . 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 ADD (attention deficit disorder) 05/11/2014  . Chronic constipation 05/11/2014  . Essential (primary) hypertension 05/11/2014  . Gastro-esophageal reflux disease without esophagitis 05/11/2014  . Asthma, mild persistent 05/11/2014  . 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 Hypokalemia 02/13/2015  . 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 Hashimoto's thyroiditis 06/14/2015  . 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 Chronic left-sided low back pain 09/19/2016  . Fatty liver disease, nonalcoholic 73/53/2992  . Left renal mass 03/26/2017  . Obesity (BMI 30.0-34.9) 03/26/2017  . 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 Motion sickness 12/26/2017  . Family history of osteoporosis 03/05/2019  . Post-menopausal 03/05/2019  . Atrophic vaginitis 06/03/2019  . Basal cell carcinoma of left upper arm 10/21/2019  . Hearing loss of right ear 11/24/2019  . Tremor of right hand 02/26/2020  . Chronic gastritis without  bleeding 05/27/2020  . Hiatal hernia 05/27/2020  . Nausea 05/27/2020   Resolved Ambulatory Problems    Diagnosis Date Noted  . IFG (impaired fasting glucose) 09/13/2013  . Backache 09/13/2013  . Wheezing 09/13/2013  . Swelling of limb 09/13/2013  . Obesity, unspecified 09/13/2013  . Low back pain 05/11/2014  . Thyromegaly 02/13/2015  . Subconjunctival hemorrhage of right eye 06/28/2015  . Acoustic neuroma ([place]) 12/10/2014  . Left ear hearing loss 10/14/2015  . Costochondritis 03/21/2016  . Irritable 10/17/2016  . Thyroid adenoma 05/03/2015  . Exposure to influenza 05/07/2018  . Accidental fall 10/27/2018   Past Medical History:  Diagnosis Date  . Asthma   . Awareness under anesthesia   . Balance problem   . Benign brain tumor ([place])   . Chronic back pain   . Complication of anesthesia   . Constipation, chronic   . Diabetes mellitus   . Difficult airway   . 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  . GERD (gastroesophageal reflux disease)   . Obesity   . Tendonitis, Achilles, left      Review of Systems See HPI.     Objective:   Physical Exam Vitals reviewed.  Constitutional:      Appearance: Normal appearance. She is obese.  Cardiovascular:     Rate and Rhythm: Normal rate.  Pulmonary:     Effort: Pulmonary effort is normal.  Musculoskeletal:     Right lower leg: No edema.     Left lower leg: No edema.     Comments: Limited ROM at waist due to pain.  Pain worse with  forward flexion and better with extension.  Strength lower ext 5/5.  Negative SLR, bilaterally.  No pain over lumbar spine to palpation but L5/S1 left paraspinal tenderness noted to palpation.   Neurological:     Mental Status: She is alert.           Assessment & Plan:  [name] KitchenMarland KitchenColey was seen today for back pain.  Diagnoses and all orders for this visit:  Chronic left-sided low back pain without sciatica -     traMADol (ULTRAM) 50 MG tablet; Take 1 tablet (50 mg total) by mouth every 6 (six) hours as needed  for up to 5 days. -     predniSONE (DELTASONE) 50 MG tablet; Take 1 tablet (50 mg total) by mouth daily. -     ketorolac (TORADOL) injection 60 mg   Toradol IM in office today.  Start prednisone in am for 5 days.  Continue flexeril as needed up to three times a day if not making you sleepy.  celebrex regularly.  Tramadol as needed for moderate to severe pain.  [name] Kitchen.PDMP reviewed during this encounter.  Consider massage and continue exercises.  Avoid lifting, bending, twisting.  Follow up with Cynthia [name] in 1 week.

## 2020-09-14 ENCOUNTER — Encounter: Payer: Self-pay | Admitting: Physician Assistant

## 2020-09-14 ENCOUNTER — Ambulatory Visit: Payer: BC Managed Care – PPO | Admitting: Sports Medicine

## 2020-09-19 ENCOUNTER — Ambulatory Visit: Payer: BC Managed Care – PPO | Admitting: Sports Medicine

## 2020-09-22 ENCOUNTER — Telehealth: Payer: Self-pay | Admitting: Neurology

## 2020-09-22 NOTE — Telephone Encounter (Signed)
Patient called, she is going out of town tomorrow for a week and only has 3 Adderall left, would like early refill. No history of early refills. She last filled Adderall on 08/26/2020, even though next refill sent on 09/09/2020 (she called [place] for next 3 months of refills), confirmed this date with pharmacy. Soonest she could fill is the 9th without authorization. Called and gave auth for early refill to [facility]. [place].

## 2020-09-23 NOTE — Telephone Encounter (Signed)
Thank you. Agree with above plan.

## 2020-10-05 ENCOUNTER — Ambulatory Visit: Payer: BC Managed Care – PPO | Admitting: Sports Medicine

## 2020-10-05 ENCOUNTER — Other Ambulatory Visit: Payer: Self-pay

## 2020-10-05 DIAGNOSIS — G8929 Other chronic pain: Secondary | ICD-10-CM | POA: Diagnosis not present

## 2020-10-05 DIAGNOSIS — M545 Low back pain, unspecified: Secondary | ICD-10-CM

## 2020-10-05 NOTE — Progress Notes (Signed)
    Procedures performed today:    None.  Independent interpretation of notes and tests performed by another provider:   None.  Brief History, Exam, Impression, and Recommendations:    Chronic left-sided low back pain This is a pleasant 60 year old female, she has a history of a L5-S1 hemilaminectomy at [facility] over a decade ago. She was doing relatively well until recently, had increasing low back pain with radiation down the left leg in an L5 distribution. Ultimately MRI showed multiple pathologic findings, dominant finding was spondylosis worse at the L5-S1 level with clear left L5-S1 foraminal stenosis. Ultimately we did a left L5-S1 transforaminal epidural which provided [name] fairly good relief. She is now having recurrence of discomfort, oral medications as well as injectables or not efficacious, proceeding with a second left L5-S1 transforaminal epidural, she is interested in getting a neurosurgical opinion, she does have an appointment with Cynthia [name] at [facility] neurosurgery which I think is appropriate. I did advise [name] that in [name] case, without any red flag symptoms it was probably best to treat [name] nonsurgically as long as possible.    ___________________________________________ Cynthia [name]. Cynthia [name], M.D., ABFM., CAQSM. Primary Care and [place] Instructor of [place] of [facility] of Medicine

## 2020-10-05 NOTE — Assessment & Plan Note (Addendum)
This is a pleasant 60 year old female, she has a history of a L5-S1 hemilaminectomy at [facility] over a decade ago. She was doing relatively well until recently, had increasing low back pain with radiation down the left leg in an L5 distribution. Ultimately MRI showed multiple pathologic findings, dominant finding was spondylosis worse at the L5-S1 level with clear left L5-S1 foraminal stenosis. Ultimately we did a left L5-S1 transforaminal epidural which provided her fairly good relief. She is now having recurrence of discomfort, oral medications as well as injectables or not efficacious, proceeding with a second left L5-S1 transforaminal epidural, she is interested in getting a neurosurgical opinion, she does have an appointment with Dr. Saintclair Halsted at [facility] neurosurgery which I think is appropriate. I did advise her that in her case, without any red flag symptoms it was probably best to treat her nonsurgically as long as possible.

## 2020-10-06 ENCOUNTER — Ambulatory Visit: Payer: BC Managed Care – PPO

## 2020-10-06 DIAGNOSIS — M5416 Radiculopathy, lumbar region: Secondary | ICD-10-CM | POA: Diagnosis not present

## 2020-10-06 DIAGNOSIS — M5440 Lumbago with sciatica, unspecified side: Secondary | ICD-10-CM | POA: Diagnosis not present

## 2020-10-07 ENCOUNTER — Other Ambulatory Visit: Payer: Self-pay | Admitting: Neurosurgery

## 2020-10-07 ENCOUNTER — Other Ambulatory Visit: Payer: BC Managed Care – PPO

## 2020-10-07 ENCOUNTER — Ambulatory Visit
Admission: RE | Admit: 2020-10-07 | Discharge: 2020-10-07 | Disposition: A | Discharge status: Home / Self Care | Payer: BC Managed Care – PPO | Source: Ambulatory Visit | Attending: Sports Medicine | Admitting: Sports Medicine

## 2020-10-07 ENCOUNTER — Other Ambulatory Visit: Payer: Self-pay

## 2020-10-07 DIAGNOSIS — M47817 Spondylosis without myelopathy or radiculopathy, lumbosacral region: Secondary | ICD-10-CM | POA: Diagnosis not present

## 2020-10-07 DIAGNOSIS — M545 Low back pain, unspecified: Secondary | ICD-10-CM

## 2020-10-07 DIAGNOSIS — M5440 Lumbago with sciatica, unspecified side: Secondary | ICD-10-CM

## 2020-10-07 DIAGNOSIS — G8929 Other chronic pain: Secondary | ICD-10-CM

## 2020-10-07 IMAGING — XA DG EPIDURAL NERVE ROOT
2 series · 2 of 2 positions shown · non-contrast
Comparison: none

CLINICAL DATA: Lumbosacral spondylosis without myelopathy. Left hip
and lateral leg pain in an L5 distribution. Severe left
neuroforaminal stenosis at L5-S1. 75% relief for 2 months after left
L5 nerve root block in [REDACTED]. Symptoms have recurred.

[Series 1: ortho standard · 1 of 1 slices shown (1 of 2)]
[im 1/1]
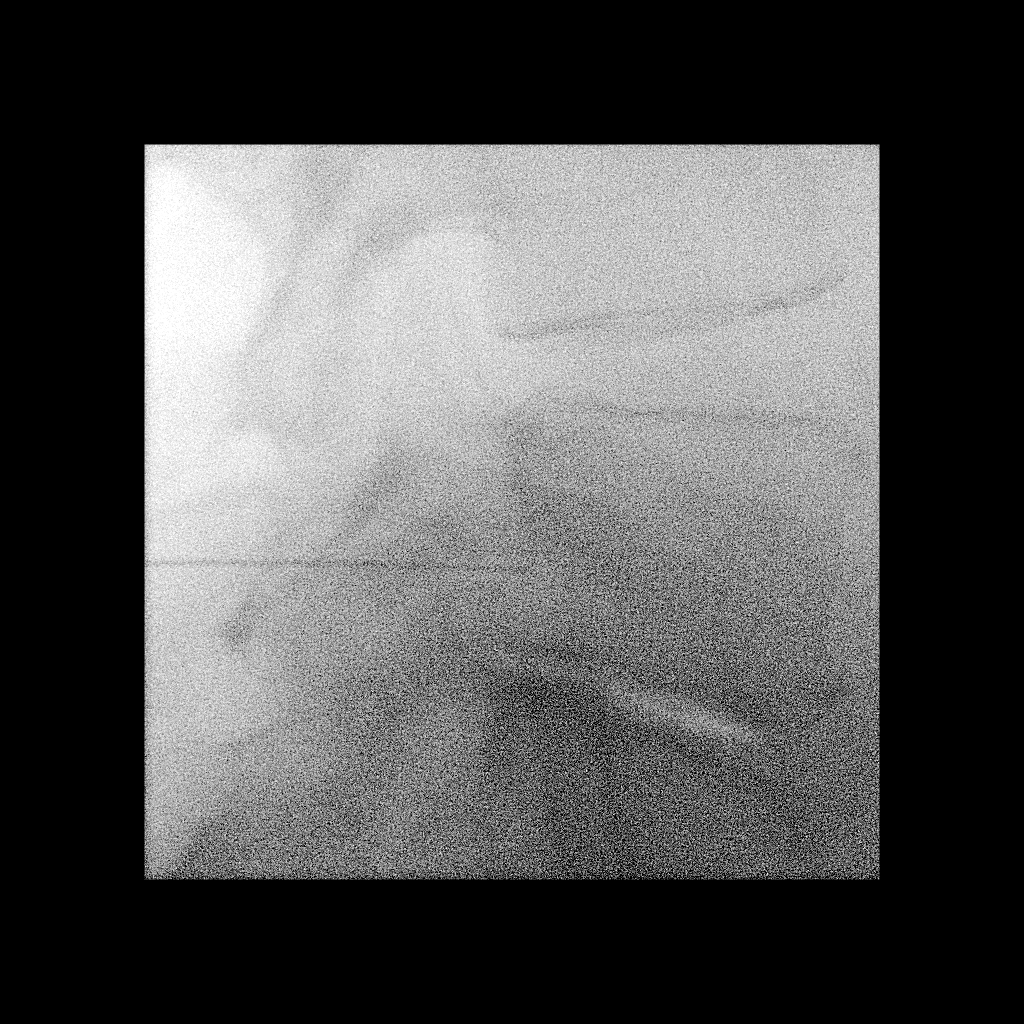

[Series 2: ortho standard · 1 of 1 slices shown (2 of 2)]
[im 1/1]
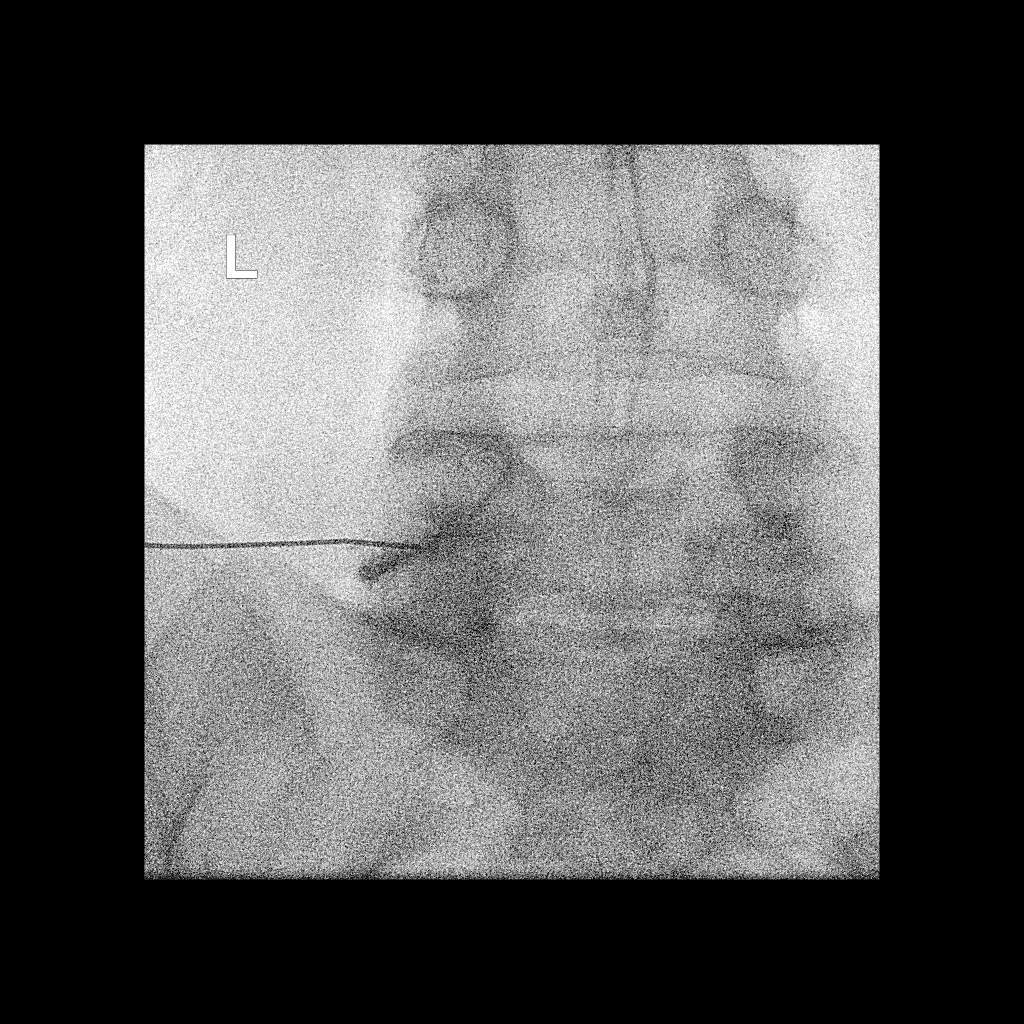

[2 of 2 positions shown; findings below may reference images not displayed]

EXAM:
EPIDURAL/NERVE ROOT

FLUOROSCOPY TIME:  Radiation Exposure Index (as provided by the
fluoroscopic device): 3.2 mGy

Fluoroscopy Time:  28 seconds

Number of Acquired Images:  0

PROCEDURE:
The procedure, risks, benefits, and alternatives were explained to
the patient. Questions regarding the procedure were encouraged and
answered. The patient understands and consents to the procedure.

LEFT L5 NERVE ROOT BLOCK AND TRANSFORAMINAL EPIDURAL: A posterior
oblique approach was taken to the intervertebral foramen on the left
at L5-S1 using a curved 5 inch 22 gauge spinal needle. Injection of
Isovue M 200 outlined the left L5 nerve root and showed good
epidural spread. No vascular opacification is seen. 80 mg of
Depo-Medrol mixed with 2 mL of 1% lidocaine were instilled. The
procedure was well-tolerated, and the patient was discharged thirty
minutes following the injection in good condition.

COMPLICATIONS:
None immediate.
IMPRESSION: Technically successful injection consisting of a left L5 nerve root
block and transforaminal epidural.

## 2020-10-07 MED ORDER — METHYLPREDNISOLONE ACETATE 40 MG/ML INJ SUSP (RADIOLOG
80.0000 mg | Freq: Once | INTRAMUSCULAR | Status: AC
  Administered 2020-10-07: 80 mg via EPIDURAL

## 2020-10-07 MED ORDER — IOPAMIDOL (ISOVUE-M 200) INJECTION 41%
1.0000 mL | Freq: Once | INTRAMUSCULAR | Status: AC
  Administered 2020-10-07: 1 mL via EPIDURAL

## 2020-10-07 NOTE — Discharge Instructions (Signed)

## 2020-10-24 DIAGNOSIS — L718 Other rosacea: Secondary | ICD-10-CM | POA: Diagnosis not present

## 2020-10-28 ENCOUNTER — Encounter (HOSPITAL_BASED_OUTPATIENT_CLINIC_OR_DEPARTMENT_OTHER): Payer: Self-pay | Admitting: Obstetrics & Gynecology

## 2020-10-28 ENCOUNTER — Other Ambulatory Visit (HOSPITAL_COMMUNITY)
Admission: RE | Admit: 2020-10-28 | Discharge: 2020-10-28 | Disposition: A | Discharge status: Home / Self Care | Payer: BC Managed Care – PPO | Source: Ambulatory Visit | Attending: Obstetrics & Gynecology | Admitting: Obstetrics & Gynecology

## 2020-10-28 ENCOUNTER — Other Ambulatory Visit: Payer: Self-pay

## 2020-10-28 ENCOUNTER — Ambulatory Visit (INDEPENDENT_AMBULATORY_CARE_PROVIDER_SITE_OTHER): Payer: BC Managed Care – PPO | Admitting: Obstetrics & Gynecology

## 2020-10-28 VITALS — BP 126/76 | HR 75 | Ht 66.5 in | Wt 205.0 lb

## 2020-10-28 DIAGNOSIS — Z780 Asymptomatic menopausal state: Secondary | ICD-10-CM

## 2020-10-28 DIAGNOSIS — Z01419 Encounter for gynecological examination (general) (routine) without abnormal findings: Secondary | ICD-10-CM | POA: Diagnosis not present

## 2020-10-28 DIAGNOSIS — Z8262 Family history of osteoporosis: Secondary | ICD-10-CM | POA: Diagnosis not present

## 2020-10-28 DIAGNOSIS — B009 Herpesviral infection, unspecified: Secondary | ICD-10-CM

## 2020-10-28 DIAGNOSIS — E890 Postprocedural hypothyroidism: Secondary | ICD-10-CM

## 2020-10-28 DIAGNOSIS — Z124 Encounter for screening for malignant neoplasm of cervix: Secondary | ICD-10-CM | POA: Diagnosis not present

## 2020-10-28 DIAGNOSIS — Z9889 Other specified postprocedural states: Secondary | ICD-10-CM | POA: Insufficient documentation

## 2020-10-28 DIAGNOSIS — N952 Postmenopausal atrophic vaginitis: Secondary | ICD-10-CM

## 2020-10-28 MED ORDER — VALACYCLOVIR HCL 1 G PO TABS: 1000.0000 mg | ORAL_TABLET | Freq: Three times a day (TID) | ORAL | 1 refills | Status: DC

## 2020-10-28 NOTE — Progress Notes (Signed)
60 y.o. G0P0000 Married White or Caucasian female here for annual exam.  Doing well except for back issues and some hearing loss.  ENT at [facility] now is going to see her again in 5 years.   Denies vaginal bleeding.     No LMP recorded. Patient has had an ablation.          Sexually active: Yes.    The current method of family planning is post menopausal status.    Exercising: Yes.    gyn Smoker:  no  Health Maintenance: Pap:  02/2018 History of abnormal Pap:  no MMG:  06/2019 Colonoscopy:  Scheduled with Dr. Benson [place] BMD:   07/08/2019, normal TDaP:  2018 Pneumonia vaccine(s):  No Shingrix:   complete Hep C testing: 11/2016 Screening Labs: [place] done in June   reports that she has never smoked. She has never used smokeless tobacco. She reports current alcohol use of about 1.0 - 2.0 standard drink of alcohol per week. She reports that she does not use drugs.  Past Medical History:  Diagnosis Date  . Acoustic neuroma ([place])   . ADD (attention deficit disorder)    and OCD-on Vyvanse  . Asthma    w/out status asthmaticus  . Awareness under anesthesia   . Balance problem    after acoustic neuroma excision  . Benign brain tumor ([place])   . Chronic back pain   . Complication of anesthesia    unrecognized difficult intubation  . Constipation, chronic    IBS, CIC  . Diabetes mellitus    Pt said No  . Difficult airway   . 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 GERD (gastroesophageal reflux disease)   . Iron deficiency anemia, unspecified 02/18/2013  . Obesity    296 lbs (highest weight in 2012)   . Tendonitis, Achilles, left     Past Surgical History:  Procedure Laterality Date  . ABLATION     and bladder sling  . ACHILLES TENDON REPAIR     x2  . ANAL FISSURE REPAIR  2012  . BACK SURGERY    . CHOLECYSTECTOMY    . CRANIOTOMY  09/13/2015   with excision of acoustic neuroma  . ESOPHAGOGASTRODUODENOSCOPY (EGD) WITH PROPOFOL N/A 07/22/2020   Procedure:  ESOPHAGOGASTRODUODENOSCOPY (EGD) WITH PROPOFOL;  Surgeon: Carol Ada, MD;  Location: [facility];  Service: Endoscopy;  Laterality: N/A;  . IMPLANTATION BONE ANCHORED HEARING AID    . MOUTH SURGERY  2015   3 implants  . OVARIAN CYST REMOVAL     x4  . THYROID LOBECTOMY Right 04/28/2015   Partial   . TOE FUSION Left     Current Outpatient Medications  Medication Sig Dispense Refill  . amphetamine-dextroamphetamine (ADDERALL XR) 30 MG 24 hr capsule Take 1 capsule (30 mg total) by mouth every morning. 30 capsule 0  . amphetamine-dextroamphetamine (ADDERALL XR) 30 MG 24 hr capsule Take 1 capsule (30 mg total) by mouth daily. 30 capsule 0  . [START ON 11/06/2020] amphetamine-dextroamphetamine (ADDERALL XR) 30 MG 24 hr capsule Take 1 capsule (30 mg total) by mouth daily. 30 capsule 0  . buPROPion (WELLBUTRIN XL) 300 MG 24 hr tablet Take 1 tablet (300 mg total) by mouth daily. 90 tablet 3  . celecoxib (CELEBREX) 200 MG capsule Take 1 capsule (200 mg total) by mouth 2 (two) times daily. 180 capsule 3  . cyclobenzaprine (FLEXERIL) 10 MG tablet Take 1 tablet (10 mg total) by mouth at bedtime.  90 tablet 3  . docusate sodium (COLACE) 50 MG capsule Take 100 mg by mouth at bedtime.    [name] Kitchen levothyroxine (SYNTHROID) 75 MCG tablet TAKE 1 TABLET DAILY (Patient taking differently: Take 75 mcg by mouth daily before breakfast.) 90 tablet 1  . LINZESS 290 MCG CAPS capsule Take 290 mcg by mouth daily.    [name] Kitchen triamterene-hydrochlorothiazide (MAXZIDE-25) 37.5-25 MG tablet Take 1 tablet by mouth daily. 90 tablet 3  . VENTOLIN HFA 108 (90 Base) MCG/ACT inhaler TAKE 2 PUFFS BY MOUTH EVERY 6 HOURS AS NEEDED FOR WHEEZE OR SHORTNESS OF BREATH (Patient taking differently: Inhale 2 puffs into the lungs every 6 (six) hours as needed for wheezing or shortness of breath.) 18 g 2  . dexlansoprazole (DEXILANT) 60 MG capsule Take 60 mg by mouth daily. (Patient not taking: Reported on 10/28/2020)     No current facility-administered  medications for this visit.    Family History  Problem Relation Age of Onset  . Thyroid disease Mother   . Heart Problems Mother        vavle issue  . Dystonia Mother   . Hypercholesterolemia Mother   . Heart attack Father   . Cancer Father        esophageal  . Heart disease Father   . Lung cancer Paternal Grandfather   . Heart disease Paternal Grandfather   . Heart disease Maternal Grandfather   . Heart disease Paternal Grandmother   . Heart disease Other        paternal and maternal side  . Cancer Brother        esophageal/stomach cancer    Review of Systems  Constitutional: Negative.   Gastrointestinal: Negative.   Genitourinary: Negative.     Exam:   BP 126/76   Pulse 75   Ht 5' 6.5" (1.689 m)   Wt 205 lb (93 kg)   BMI 32.59 kg/m   Height: 5' 6.5" (168.9 cm)  General appearance: alert, cooperative and appears stated age Head: Normocephalic, without obvious abnormality, atraumatic Neck: no adenopathy, supple, symmetrical, trachea midline and thyroid normal to inspection and palpation Lungs: clear to auscultation bilaterally Breasts: normal appearance, no masses or tenderness Heart: regular rate and rhythm Abdomen: soft, non-tender; bowel sounds normal; no masses,  no organomegaly Extremities: extremities normal, atraumatic, no cyanosis or edema Skin: Skin color, texture, turgor normal. No rashes or lesions Lymph nodes: Cervical, supraclavicular, and axillary nodes normal. No abnormal inguinal nodes palpated Neurologic: Grossly normal   Pelvic: External genitalia:  no lesions              Urethra:  normal appearing urethra with no masses, tenderness or lesions              Bartholins and Skenes: normal                 Vagina: normal appearing vagina with normal color and no discharge, no lesions              Cervix: no lesions              Pap taken: Yes.   Bimanual Exam:  Uterus:  normal size, contour, position, consistency, mobility, non-tender               Adnexa: normal adnexa and no mass, fullness, tenderness               Rectovaginal: Declines rectal exam  Anus:  no vaginal lesions  Chaperone, Octaviano Batty, CMA, was present for exam.  Assessment/Plan: 1. Well woman exam with routine gynecological exam - pap smear obtained today - colonoscopy is scheduled for July - BMD done 2021 - MMG due - vaccines updates - lab work done with [employer]  2. HSV infection - rx for valtrex to pharmacy  3. Postmenopausal - no HRT  4. Family history of osteoporosis  5. Atrophic vaginitis  6. History of partial thyroidectomy  7. History of endometrial ablation

## 2020-11-01 LAB — CYTOLOGY - PAP
Comment: NEGATIVE
Diagnosis: NEGATIVE
High risk HPV: NEGATIVE

## 2020-11-04 ENCOUNTER — Other Ambulatory Visit: Payer: Self-pay

## 2020-11-04 ENCOUNTER — Ambulatory Visit
Admission: RE | Admit: 2020-11-04 | Discharge: 2020-11-04 | Disposition: A | Discharge status: Home / Self Care | Payer: BC Managed Care – PPO | Source: Ambulatory Visit | Attending: Neurosurgery | Admitting: Neurosurgery

## 2020-11-04 DIAGNOSIS — M5126 Other intervertebral disc displacement, lumbar region: Secondary | ICD-10-CM | POA: Diagnosis not present

## 2020-11-04 DIAGNOSIS — M5440 Lumbago with sciatica, unspecified side: Secondary | ICD-10-CM

## 2020-11-04 IMAGING — MR MR LUMBAR SPINE WO/W CM
4 of 7 series · 22 of 48 positions shown · IV contrast (multihance)
Comparison: Lumbar radiographs and MRI [DATE].

CLINICAL DATA: 59-year-old female with low back pain radiating to
the left leg for over 1 year. Spinal injection 3 weeks ago. Remote
surgery in [F4].

EXAM:
MRI LUMBAR SPINE WITHOUT AND WITH CONTRAST
TECHNIQUE: Multiplanar and multiecho pulse sequences of the lumbar spine were
obtained without and with intravenous contrast.
CONTRAST:  19mL MULTIHANCE GADOBENATE DIMEGLUMINE 529 MG/ML IV SOLN

[Series 3: T1 · sagittal · 4.0mm · 0.88mm/px · 3 of 14 slices shown (1 of 2)]
[im 1/14]
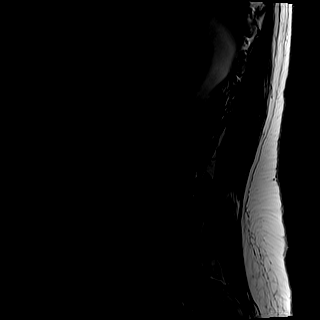
[im 7/14]
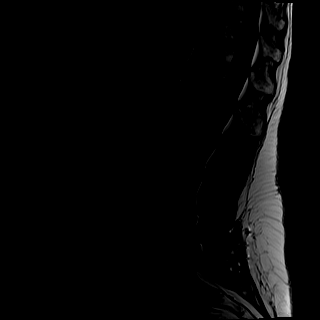
[im 14/14]
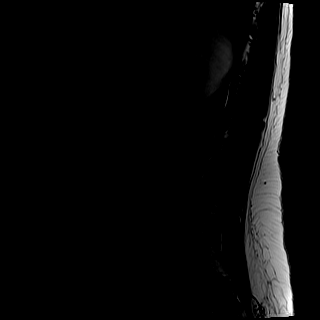

[Series 5: T2 · axial · 4.0mm · 0.39mm/px · z∈[+9,+225]mm · 11 of 39 slices shown (1 of 2)]
[im 1/39]
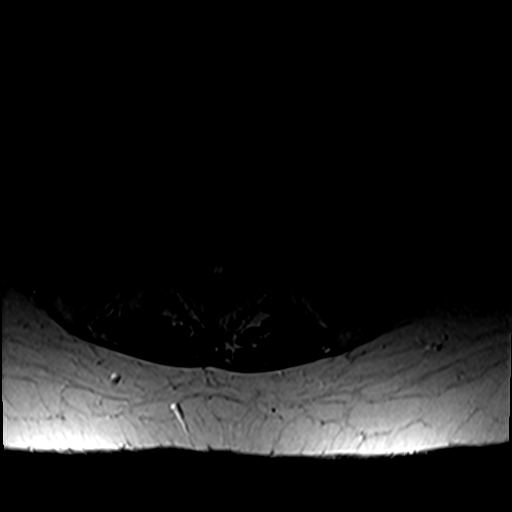
[im 4/39]
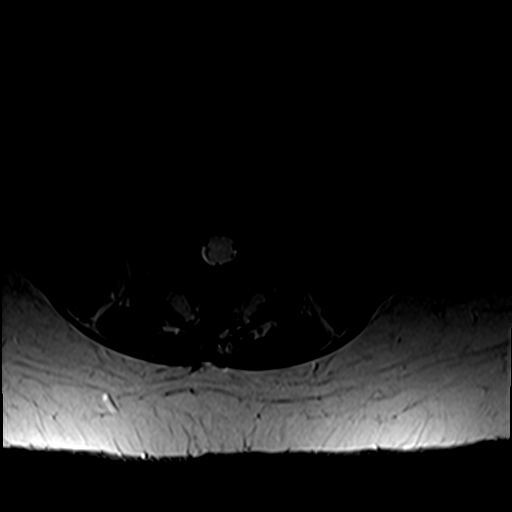
[im 8/39]
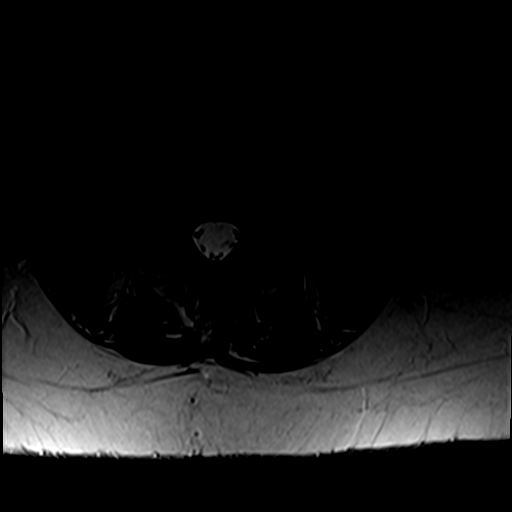
[im 12/39]
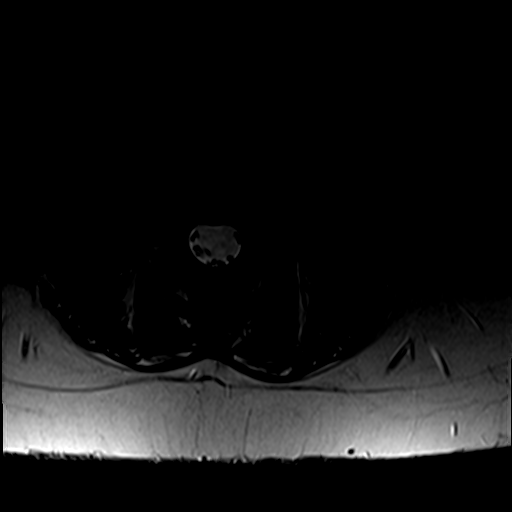
[im 16/39]
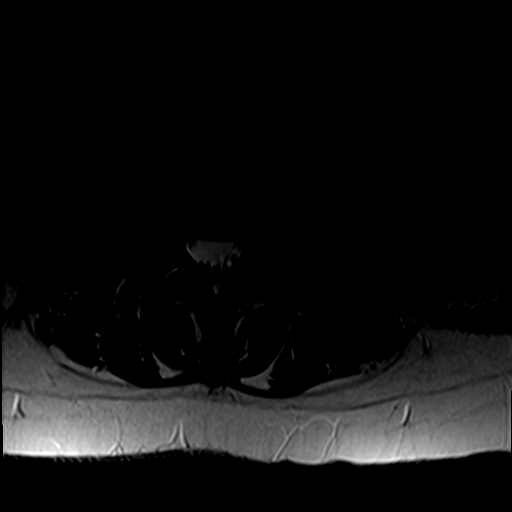
[im 20/39]
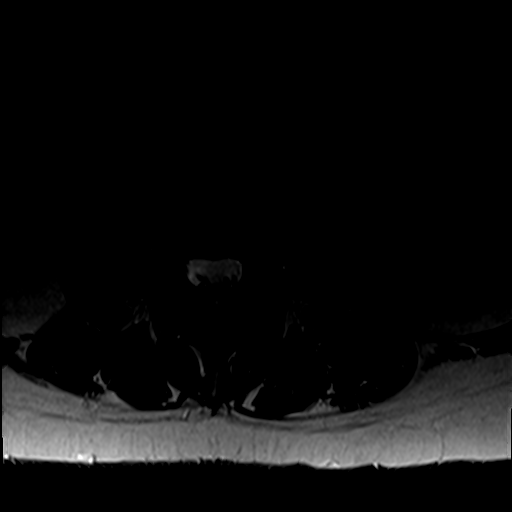
[im 23/39]
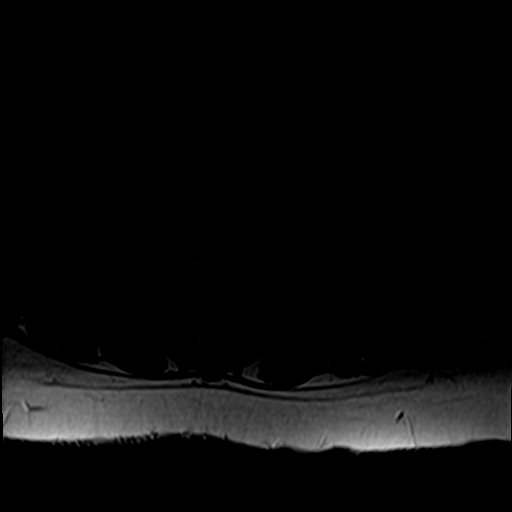
[im 27/39]
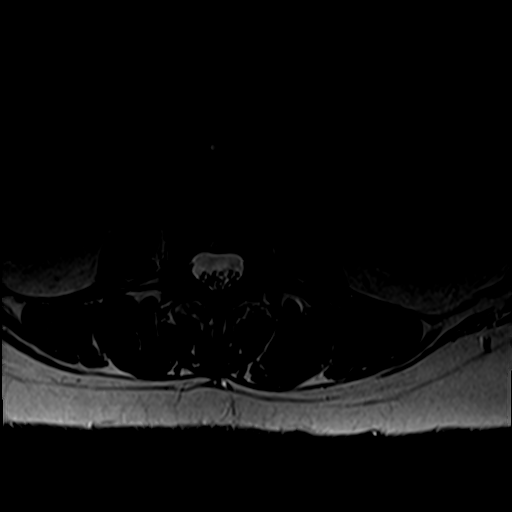
[im 31/39]
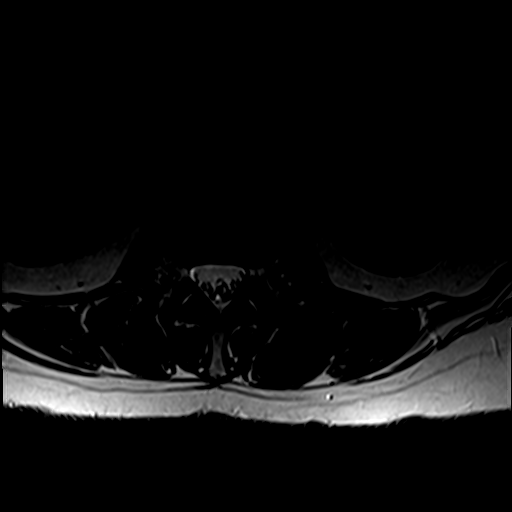
[im 35/39]
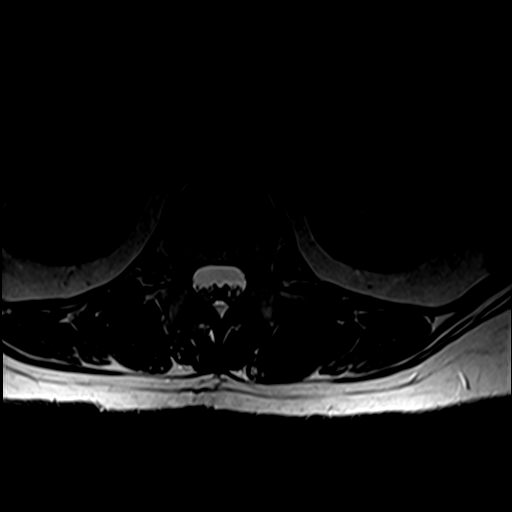
[im 39/39]
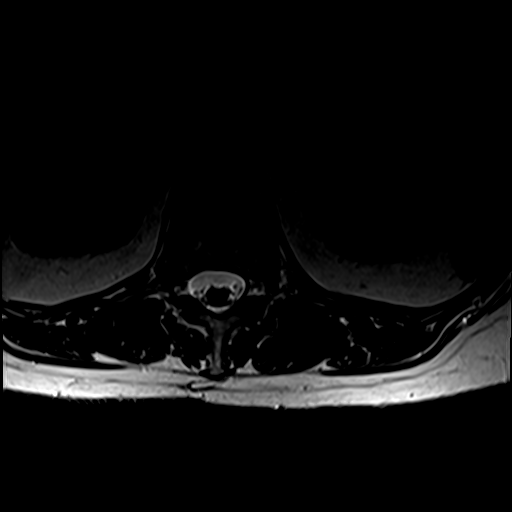

[Series 6: T1 · axial · 4.0mm · 0.39mm/px · z∈[+9,+207]mm · 4 of 39 slices shown (2 of 2)]
[im 1/39]
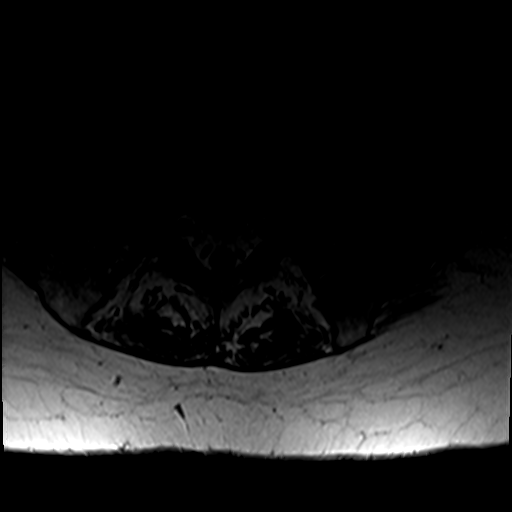
[im 4/39]
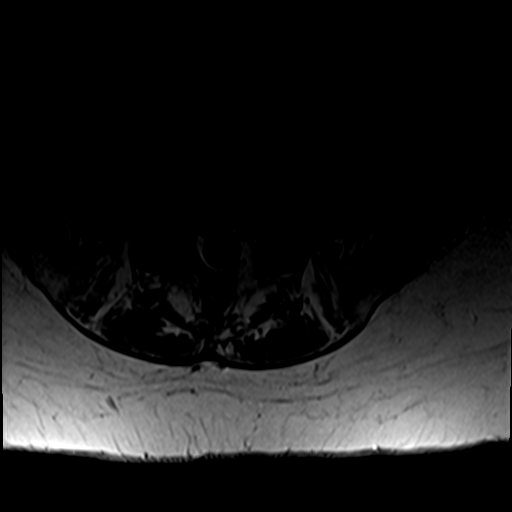
[im 20/39]
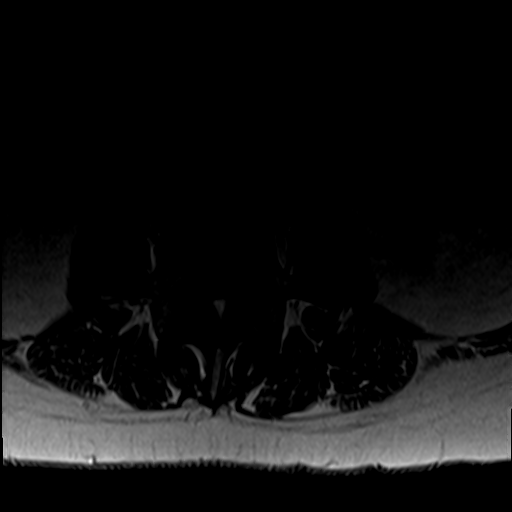
[im 35/39]
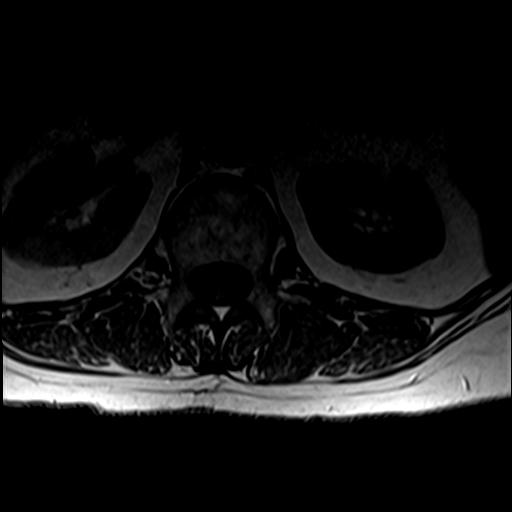

[Series 7: T2 · sagittal · 4.0mm · 1.09mm/px · 4 of 14 slices shown (2 of 2)]
[im 1/14]
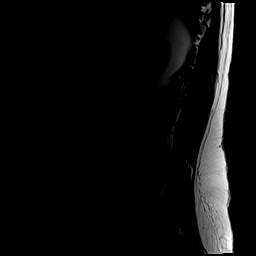
[im 5/14]
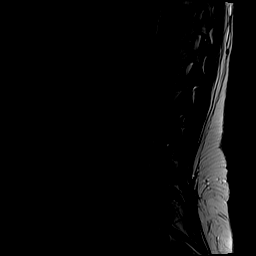
[im 9/14]
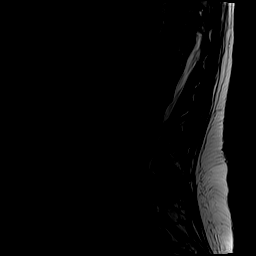
[im 14/14]
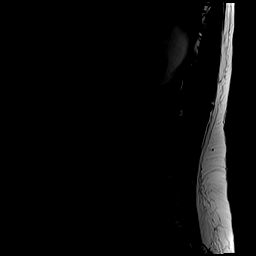

[22 of 48 positions shown; findings below may reference images not displayed]

FINDINGS: Segmentation: Normal on the comparison radiographs which is the same
numbering system used on the prior MRI.

Alignment: Stable lumbar lordosis from last year. There is subtle
retrolisthesis at both L2-L3 and L5-S1.

Vertebrae: Small benign hemangioma of the left L4 pedicle is stable.
Similar small hemangioma in the T12 superior body is stable. Subtle
hemangiomas at other lumbar levels. Normal background bone marrow
signal. No acute osseous abnormality identified. Intact visible
sacrum and SI joints.

Conus medullaris and cauda equina: Conus extends to the L1 level. No
lower spinal cord or conus signal abnormality. No abnormal
intradural enhancement. No dural thickening. Cauda equina nerve
roots appear normal.

Paraspinal and other soft tissues: Negative abdominal viscera.
Subtle postoperative changes to the left lower lumbar paraspinal
soft tissues.

Disc levels:

T10-T11: Disc desiccation with leftward mild disc bulge or
protrusion. Mild left T10 foraminal stenosis.

T11-T12: Disc desiccation.  Mild facet hypertrophy but no stenosis.

T12-L1:  Negative.

L1-L2: Disc desiccation and disc space loss with circumferential
disc bulge. Broad-based posterior component. No significant
stenosis.

L2-L3: Disc desiccation with circumferential disc bulge. Broad-based
component of disc protrusion. Mild facet and ligament flavum
hypertrophy appears increased from last year. New mild spinal
stenosis. Mild bilateral lateral recess and L2 foraminal stenosis.
This level has progressed.

L3-L4: Negative disc. Mild facet and ligament flavum hypertrophy. No
stenosis.

L4-L5: Disc desiccation with stable broad-based central disc
protrusion although increased appearance of associated annular
fissure (series 4, image 7). Stable mild facet and ligament flavum
hypertrophy. Disc material in proximity to the lateral recesses but
no convincing stenosis.

L5-S1: Chronic postoperative changes to the left lamina and left
lateral recess with mild architectural distortion. Chronic disc
space loss with circumferential disc osteophyte complex and moderate
facet hypertrophy. No spinal or convincing lateral recess stenosis.
Moderate to severe bilateral L5 foraminal stenosis is stable.
IMPRESSION: 1. Chronic postoperative changes at L5-S1 with architectural
distortion at the left lateral recess and stable multifactorial
moderate to severe bilateral L5 foraminal stenosis.
2. Progressed broad-based disc and posterior element degeneration at
L2-L3 since last year with new mild spinal stenosis, increased mild
lateral recess, and foraminal stenosis.
3. L4-L5 disc degeneration with increased annular fissure associated
with small central disc protrusion. This might be a source for L5
radiculitis at the lateral recesses, but there is no convincing
stenosis.

## 2020-11-04 MED ORDER — GADOBENATE DIMEGLUMINE 529 MG/ML IV SOLN
19.0000 mL | Freq: Once | INTRAVENOUS | Status: AC | PRN
  Administered 2020-11-04: 19 mL via INTRAVENOUS

## 2020-11-06 ENCOUNTER — Other Ambulatory Visit: Payer: BC Managed Care – PPO

## 2020-11-08 DIAGNOSIS — M5440 Lumbago with sciatica, unspecified side: Secondary | ICD-10-CM | POA: Diagnosis not present

## 2020-11-18 ENCOUNTER — Other Ambulatory Visit: Payer: Self-pay | Admitting: Physician Assistant

## 2020-11-18 DIAGNOSIS — R454 Irritability and anger: Secondary | ICD-10-CM

## 2020-11-25 ENCOUNTER — Ambulatory Visit: Payer: BC Managed Care – PPO | Admitting: Physician Assistant

## 2020-11-28 ENCOUNTER — Encounter: Payer: Self-pay | Admitting: Physician Assistant

## 2020-11-28 ENCOUNTER — Other Ambulatory Visit: Payer: Self-pay

## 2020-11-28 ENCOUNTER — Ambulatory Visit: Payer: BC Managed Care – PPO | Admitting: Physician Assistant

## 2020-11-28 VITALS — BP 118/54 | HR 73 | Ht 66.5 in | Wt 203.0 lb

## 2020-11-28 DIAGNOSIS — I10 Essential (primary) hypertension: Secondary | ICD-10-CM

## 2020-11-28 DIAGNOSIS — R0789 Other chest pain: Secondary | ICD-10-CM | POA: Insufficient documentation

## 2020-11-28 DIAGNOSIS — F900 Attention-deficit hyperactivity disorder, predominantly inattentive type: Secondary | ICD-10-CM | POA: Diagnosis not present

## 2020-11-28 DIAGNOSIS — Z13220 Encounter for screening for lipoid disorders: Secondary | ICD-10-CM

## 2020-11-28 DIAGNOSIS — E039 Hypothyroidism, unspecified: Secondary | ICD-10-CM | POA: Diagnosis not present

## 2020-11-28 DIAGNOSIS — M545 Low back pain, unspecified: Secondary | ICD-10-CM

## 2020-11-28 DIAGNOSIS — E063 Autoimmune thyroiditis: Secondary | ICD-10-CM

## 2020-11-28 DIAGNOSIS — Z79899 Other long term (current) drug therapy: Secondary | ICD-10-CM

## 2020-11-28 DIAGNOSIS — R454 Irritability and anger: Secondary | ICD-10-CM

## 2020-11-28 DIAGNOSIS — G8929 Other chronic pain: Secondary | ICD-10-CM

## 2020-11-28 LAB — CBC WITH DIFFERENTIAL/PLATELET
Absolute Monocytes: 373 cells/uL (ref 200–950)
Basophils Absolute: 11 cells/uL (ref 0–200)
Basophils Relative: 0.2 %
Eosinophils Absolute: 103 cells/uL (ref 15–500)
Eosinophils Relative: 1.9 %
HCT: 38.9 % (ref 35.0–45.0)
Hemoglobin: 13.8 g/dL (ref 11.7–15.5)
Lymphs Abs: 1264 cells/uL (ref 850–3900)
MCH: 29.7 pg (ref 27.0–33.0)
MCHC: 35.5 g/dL (ref 32.0–36.0)
MCV: 83.8 fL (ref 80.0–100.0)
MPV: 8.9 fL (ref 7.5–12.5)
Monocytes Relative: 6.9 %
Neutro Abs: 3650 cells/uL (ref 1500–7800)
Neutrophils Relative %: 67.6 %
Platelets: 167 10*3/uL (ref 140–400)
RBC: 4.64 10*6/uL (ref 3.80–5.10)
RDW: 12.4 % (ref 11.0–15.0)
Total Lymphocyte: 23.4 %
WBC: 5.4 10*3/uL (ref 3.8–10.8)

## 2020-11-28 LAB — COMPLETE METABOLIC PANEL WITH GFR
AG Ratio: 2 (calc) (ref 1.0–2.5)
ALT: 14 U/L (ref 6–29)
AST: 17 U/L (ref 10–35)
Albumin: 4.7 g/dL (ref 3.6–5.1)
Alkaline phosphatase (APISO): 54 U/L (ref 37–153)
BUN: 18 mg/dL (ref 7–25)
CO2: 29 mmol/L (ref 20–32)
Calcium: 9.4 mg/dL (ref 8.6–10.4)
Chloride: 102 mmol/L (ref 98–110)
Creat: 0.95 mg/dL (ref 0.50–1.05)
GFR, Est African American: 77 mL/min/{1.73_m2} (ref >=60)
GFR, Est Non African American: 66 mL/min/{1.73_m2} (ref >=60)
Globulin: 2.4 g/dL (calc) (ref 1.9–3.7)
Glucose, Bld: 89 mg/dL (ref 65–99)
Potassium: 3.2 mmol/L — ABNORMAL LOW (ref 3.5–5.3)
Sodium: 140 mmol/L (ref 135–146)
Total Bilirubin: 1.1 mg/dL (ref 0.2–1.2)
Total Protein: 7.1 g/dL (ref 6.1–8.1)

## 2020-11-28 LAB — LIPID PANEL W/REFLEX DIRECT LDL
Cholesterol: 188 mg/dL (ref <200)
HDL: 71 mg/dL (ref >=50)
LDL Cholesterol (Calc): 98 mg/dL (calc)
Non-HDL Cholesterol (Calc): 117 mg/dL (calc) (ref <130)
Total CHOL/HDL Ratio: 2.6 (calc) (ref <5.0)
Triglycerides: 98 mg/dL (ref <150)

## 2020-11-28 LAB — TSH: TSH: 1.92 mIU/L (ref 0.40–4.50)

## 2020-11-28 MED ORDER — LEVOTHYROXINE SODIUM 75 MCG PO TABS
75.0000 ug | ORAL_TABLET | Freq: Every day | ORAL | 1 refills | Status: DC
Start: 2020-11-28 — End: 2020-12-09

## 2020-11-28 MED ORDER — TRIAMTERENE-HCTZ 37.5-25 MG PO TABS: 1.0000 | ORAL_TABLET | Freq: Every day | ORAL | 3 refills | Status: DC

## 2020-11-28 MED ORDER — AMPHETAMINE-DEXTROAMPHET ER 30 MG PO CP24: 30.0000 mg | ORAL_CAPSULE | ORAL | 0 refills | Status: DC

## 2020-11-28 MED ORDER — CELECOXIB 200 MG PO CAPS
200.0000 mg | ORAL_CAPSULE | Freq: Two times a day (BID) | ORAL | 3 refills | Status: DC
Start: 2020-11-28 — End: 2021-01-20

## 2020-11-28 MED ORDER — AMPHETAMINE-DEXTROAMPHET ER 30 MG PO CP24: 30.0000 mg | ORAL_CAPSULE | Freq: Every day | ORAL | 0 refills | Status: DC

## 2020-11-28 MED ORDER — BUPROPION HCL ER (XL) 300 MG PO TB24
1.0000 | ORAL_TABLET | Freq: Every day | ORAL | 1 refills | Status: DC
Start: 2020-11-28 — End: 2021-02-16

## 2020-11-28 NOTE — Progress Notes (Signed)
Subjective:    Patient ID: Cynthia [name], female    DOB: 1960/11/07, 60 y.o.   MRN: [id number]  HPI Pt is a 60 yo obese female with ADHD, HTN, hypothyroidism, irritability who presents to the clinic for medication refills.   She is doing well with medication. She needs refills. Mood is good. No SI/HC. Denies any CP, palpitations, headaches or vision changes. No problems sleeping or with anxiety.   She is having some right sided chest wall pain since this morning. No known injury. Not done anything for it. Worst with touch and moving right arm.    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[place]) 12/06/2015   Deafness in left ear 12/06/2015   Chronic left-sided low back pain 09/19/2016   Fatty liver disease, nonalcoholic 32/20/2542   Left renal mass 03/26/2017   Obesity (BMI 30.0-34.9) 03/26/2017   Compression of right radial nerve 06/26/2017   Motion sickness 12/26/2017   Family history of osteoporosis 03/05/2019   Post-menopausal 03/05/2019   Atrophic vaginitis 06/03/2019   Basal cell carcinoma of left upper arm 10/21/2019   Hearing loss of right ear 11/24/2019   Tremor of right hand 02/26/2020   Chronic gastritis without bleeding 05/27/2020   Hiatal hernia 05/27/2020   Nausea 05/27/2020   History of endometrial ablation 10/28/2020   History of partial thyroidectomy 10/28/2020   Right-sided chest wall pain 11/28/2020   Resolved Ambulatory Problems    Diagnosis  Date Noted   IFG (impaired fasting glucose) 09/13/2013   Backache 09/13/2013   Wheezing 09/13/2013   Swelling of limb 09/13/2013   Obesity, unspecified 09/13/2013   Low back pain 05/11/2014   Thyromegaly 02/13/2015   Subconjunctival hemorrhage of right eye 06/28/2015   Acoustic neuroma ([place]) 12/10/2014   Left ear hearing loss 10/14/2015   Costochondritis 03/21/2016   Irritable 10/17/2016   Thyroid adenoma 05/03/2015   Exposure to influenza 05/07/2018   Accidental fall 10/27/2018   Past Medical History:  Diagnosis Date   Asthma    Awareness under anesthesia    Balance problem    Chronic back pain    Complication of anesthesia    Constipation, chronic    Difficult airway    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     Objective:   Physical Exam Vitals reviewed.  Constitutional:      Appearance: Normal appearance.  HENT:     Head: Normocephalic.  Cardiovascular:     Rate and Rhythm: Normal rate and regular rhythm.  Pulmonary:     Effort: Pulmonary effort is normal.     Breath sounds: Normal breath sounds.  Musculoskeletal:     Comments: Right pectoralis tenderness and discomfort with moving right arm.   Neurological:     General: No focal deficit present.     Mental Status: She is alert and  oriented to person, place, and time.  Psychiatric:        Mood and Affect: Mood normal.          Assessment & Plan:  [name] KitchenMarland KitchenMadi was seen today for follow-up.  Diagnoses and all orders for this visit:  Attention deficit hyperactivity disorder (ADHD), predominantly inattentive type -     amphetamine-dextroamphetamine (ADDERALL XR) 30 MG 24 hr capsule; Take 1 capsule (30 mg total) by mouth every morning. -     amphetamine-dextroamphetamine (ADDERALL XR) 30 MG 24 hr capsule; Take 1 capsule (30 mg total) by mouth  daily. -     amphetamine-dextroamphetamine (ADDERALL XR) 30 MG 24 hr capsule; Take 1 capsule (30 mg total) by mouth daily.  Essential (primary) hypertension -     COMPLETE METABOLIC PANEL WITH GFR -     triamterene-hydrochlorothiazide (MAXZIDE-25) 37.5-25 MG tablet; Take 1 tablet by mouth daily.  Hashimoto's thyroiditis -     TSH -     levothyroxine (SYNTHROID) 75 MCG tablet; Take 1 tablet (75 mcg total) by mouth daily before breakfast.  Screening for lipid disorders -     Lipid Panel w/reflex Direct LDL  Hypothyroidism, unspecified type -     TSH  Medication management -     CBC with Differential/Platelet -     COMPLETE METABOLIC PANEL WITH GFR -     Lipid Panel w/reflex Direct LDL -     TSH  Irritable -     buPROPion (WELLBUTRIN XL) 300 MG 24 hr tablet; Take 1 tablet (300 mg total) by mouth daily.  Chronic left-sided low back pain without sciatica -     celecoxib (CELEBREX) 200 MG capsule; Take 1 capsule (200 mg total) by mouth 2 (two) times daily.  Right-sided chest wall pain   Adderall refilled for 3 months. [place] for another 3 months. Follow up in 6 months.   Suspect right chest wall pain more muscular. Ice, celebrex, biofreeze, stretches and time. Follow up as needed if symptoms worsen or do not improve.   Recheck TSH. Refilled levothyroxine.  BP to goal. Refilled medication.   Needs colonoscopy after July 2022.  Follow up in 6 months.

## 2020-11-29 ENCOUNTER — Other Ambulatory Visit: Payer: Self-pay

## 2020-11-29 ENCOUNTER — Other Ambulatory Visit: Payer: Self-pay | Admitting: Neurology

## 2020-11-29 DIAGNOSIS — Z8639 Personal history of other endocrine, nutritional and metabolic disease: Secondary | ICD-10-CM

## 2020-11-29 NOTE — Progress Notes (Signed)
Cynthia [name],   Normal hemoglobin.  Great kidney and liver function.  Normal glucose.  Cholesterol looks great.  Thyroid wonderful. Potassium is low. Have you taken any potassium supplement in the past? I could give you 73meq potassium or you could start OTC and recheck in 1 week.

## 2020-12-05 ENCOUNTER — Other Ambulatory Visit: Payer: Self-pay | Admitting: Physician Assistant

## 2020-12-05 DIAGNOSIS — E063 Autoimmune thyroiditis: Secondary | ICD-10-CM

## 2020-12-09 ENCOUNTER — Other Ambulatory Visit: Payer: Self-pay | Admitting: Neurology

## 2020-12-09 DIAGNOSIS — E063 Autoimmune thyroiditis: Secondary | ICD-10-CM

## 2020-12-09 MED ORDER — LEVOTHYROXINE SODIUM 75 MCG PO TABS: 75.0000 ug | ORAL_TABLET | Freq: Every day | ORAL | 1 refills | Status: DC

## 2020-12-13 ENCOUNTER — Other Ambulatory Visit: Payer: Self-pay | Admitting: Gastroenterology

## 2020-12-22 ENCOUNTER — Telehealth (INDEPENDENT_AMBULATORY_CARE_PROVIDER_SITE_OTHER): Payer: BC Managed Care – PPO | Admitting: Family Medicine

## 2020-12-22 ENCOUNTER — Other Ambulatory Visit (HOSPITAL_BASED_OUTPATIENT_CLINIC_OR_DEPARTMENT_OTHER): Payer: Self-pay

## 2020-12-22 ENCOUNTER — Encounter: Payer: Self-pay | Admitting: Family Medicine

## 2020-12-22 DIAGNOSIS — U071 COVID-19: Secondary | ICD-10-CM | POA: Diagnosis not present

## 2020-12-22 DIAGNOSIS — J4530 Mild persistent asthma, uncomplicated: Secondary | ICD-10-CM | POA: Diagnosis not present

## 2020-12-22 MED ORDER — MOLNUPIRAVIR EUA 200MG CAPSULE
4.0000 | ORAL_CAPSULE | Freq: Two times a day (BID) | ORAL | 0 refills | Status: AC
  Filled 2020-12-22: qty 40, 5d supply, fill #0

## 2020-12-22 MED ORDER — ALBUTEROL SULFATE HFA 108 (90 BASE) MCG/ACT IN AERS: 2.0000 | INHALATION_SPRAY | Freq: Four times a day (QID) | RESPIRATORY_TRACT | 11 refills | Status: DC | PRN

## 2020-12-22 MED ORDER — BENZONATATE 100 MG PO CAPS
100.0000 mg | ORAL_CAPSULE | Freq: Two times a day (BID) | ORAL | 0 refills | Status: DC | PRN
  Filled 2020-12-22: qty 20, 10d supply, fill #0

## 2020-12-22 NOTE — Progress Notes (Signed)
Virtual Video Visit via MyChart Note  I connected with  [name] on 12/22/20 at  2:20 PM EDT by the video enabled telemedicine application for MyChart, and verified that I am speaking with the correct person using two identifiers.   I introduced myself as a Designer, jewellery with the practice. We discussed the limitations of evaluation and management by telemedicine and the availability of in person appointments. The patient expressed understanding and agreed to proceed.  Participating parties in this visit include: The patient and the nurse practitioner listed.  The patient is: At home I am: In the office - [place]  Subjective:    CC:  Chief Complaint  Patient presents with   Covid Positive    HPI: Cynthia [name] is a 60 y.o. year old female presenting today via [place] today for COVID infection.  Patient's husband had tested positive for COVID earlier this week.  She started showing symptoms on Tuesday with a sore throat.  Current symptoms include body aches, sore throat, dry cough, fatigue, chills.  She has since tested positive for COVID.  She is interested in trying the antiviral options.  She would also like benzonatate for cough as it has continued to worsen and keeps her from sleeping.  She does have a history of asthma and hypertension.  She denies any chest pain, difficulty breathing, wheezing, GI symptoms, loss of taste or smell, lethargy, signs of dehydration.   Past medical history, Surgical history, Family history not pertinant except as noted below, Social history, Allergies, and medications have been entered into the medical record, reviewed, and corrections made.   Review of Systems:  All review of systems negative except what is listed in the HPI   Objective:    General:  Speaking clearly in complete sentences. Absent shortness of breath noted.   Alert and oriented x3.   Normal judgment.  Absent acute distress.   Impression and  Recommendations:    1. Mild persistent asthma without complication 2. COVID-19  She is eligible for antiviral.  We will send in molnupiravir.  Discussed and attach a link to patient education via AVS on MyChart.  Sending in benzonatate for cough.  Can continue over-the-counter options including cough, cold, analgesics.  Refilling her albuterol inhaler.  Encourage supportive therapy including rest, hydration, warm compresses, salt water gargles, warm liquids with honey and lemon, humidifier. Patient aware of signs/symptoms requiring further/urgent evaluation.   - molnupiravir EUA 200 mg CAPS; Take 4 capsules (800 mg total) by mouth 2 (two) times daily for 5 days.  Dispense: 40 capsule; Refill: 0 - benzonatate (TESSALON) 100 MG capsule; Take 1 capsule (100 mg total) by mouth 2 (two) times daily as needed for cough.  Dispense: 20 capsule; Refill: 0 - albuterol (VENTOLIN HFA) 108 (90 Base) MCG/ACT inhaler; Inhale 2 puffs into the lungs every 6 (six) hours as needed for wheezing.  Dispense: 2 each; Refill: 11   Follow-up if symptoms worsen or fail to improve.     I discussed the assessment and treatment plan with the patient. The patient was provided an opportunity to ask questions and all were answered. The patient agreed with the plan and demonstrated an understanding of the instructions.   The patient was advised to call back or seek an in-person evaluation if the symptoms worsen or if the condition fails to improve as anticipated.  I provided 20 minutes of non-face-to-face interaction with this [place] visit including intake, same-day documentation, and chart review.   Purcell Nails  Olevia Bowens, DNP, FNP-C

## 2020-12-22 NOTE — Patient Instructions (Signed)
Link to [employer] information sheet: [email or web address]  Over the counter medications that may be helpful for symptoms:  Guaifenesin 1200 mg extended release tabs twice daily, with plenty of water For cough and congestion Brand name: Mucinex   Pseudoephedrine 30 mg, one or two tabs every 4 to 6 hours For sinus congestion Brand name: Sudafed You must get this from the pharmacy counter.  Oxymetazoline nasal spray each morning, one spray in each nostril, for NO MORE THAN 3 days  For nasal and sinus congestion Brand name: Afrin Saline nasal spray or Saline Nasal Irrigation 3-5 times a day For nasal and sinus congestion Brand names: Ocean or AYR Fluticasone nasal spray, one spray in each nostril, each morning (after oxymetazoline and saline, if used) For nasal and sinus congestion Brand name: Flonase Warm salt water gargles  For sore throat Every few hours as needed Alternate ibuprofen 400-600 mg and acetaminophen 1000 mg every 4-6 hours For fever, body aches, headache Brand names: Motrin or Advil and Tylenol Dextromethorphan 12-hour cough version 30 mg every 12 hours  For cough Brand name: Delsym Stop all other cold medications for now (Nyquil, Dayquil, Tylenol Cold, Theraflu, etc) and other non-prescription cough/cold preparations. Many of these have the same ingredients listed above and could cause an overdose of medication.   Herbal treatments that have been shown to be helpful in some patients include: Vitamin C 1000mg  per day Vitamin D 4000iU per day Zinc 100mg  per day Quercetin 25-500mg  twice a day Melatonin 5-10mg  at bedtime  General Instructions Allow your body to rest Drink PLENTY of fluids Isolate yourself from everyone, even family, until test results have returned  If your COVID-19 test is positive Then you ARE INFECTED and you can pass the virus to others You must quarantine from others for a minimum of  10 days since symptoms started  AND You are fever free for 24 hours WITHOUT any medication to reduce fever AND Your symptoms are improving Do not go to the store or other public areas Do not go around household members who are not known to be infected with COVID-19 If you MUST leave your area of quarantine (example: go to a bathroom you share with others in your home), you must Wear a mask Wash your hands thoroughly Wipe down any surfaces you touch Do not share food, drinks, towels, or other items with other persons Dispose of your own tissues, food containers, etc  Once you have recovered, please continue good preventive care measures, including:  wearing a mask when in public wash your hands frequently avoid touching your face/nose/eyes cover coughs/sneezes with the inside of your elbow stay out of crowds keep a 6 foot distance from others  If you develop severe shortness of breath, uncontrolled fevers, coughing up blood, confusion, chest pain, or signs of dehydration (such as significantly decreased urine amounts or dizziness with standing) please go to the ER.

## 2020-12-26 DIAGNOSIS — S62101A Fracture of unspecified carpal bone, right wrist, initial encounter for closed fracture: Secondary | ICD-10-CM | POA: Diagnosis not present

## 2020-12-27 DIAGNOSIS — M25531 Pain in right wrist: Secondary | ICD-10-CM | POA: Diagnosis not present

## 2021-01-19 ENCOUNTER — Encounter (HOSPITAL_COMMUNITY): Payer: Self-pay | Admitting: Gastroenterology

## 2021-01-20 ENCOUNTER — Other Ambulatory Visit: Payer: Self-pay

## 2021-01-20 ENCOUNTER — Encounter (HOSPITAL_COMMUNITY)
Admission: RE | Disposition: A | Discharge status: Home / Self Care | Payer: Self-pay | Source: Home / Self Care | Attending: Gastroenterology

## 2021-01-20 ENCOUNTER — Encounter (HOSPITAL_COMMUNITY): Payer: Self-pay | Admitting: Gastroenterology

## 2021-01-20 ENCOUNTER — Ambulatory Visit (HOSPITAL_COMMUNITY)
Admission: RE | Admit: 2021-01-20 | Discharge: 2021-01-20 | Disposition: A | Discharge status: Home / Self Care | Payer: BC Managed Care – PPO | Attending: Gastroenterology | Admitting: Gastroenterology

## 2021-01-20 ENCOUNTER — Ambulatory Visit (HOSPITAL_COMMUNITY): Payer: BC Managed Care – PPO | Admitting: Certified Registered"

## 2021-01-20 DIAGNOSIS — Z8349 Family history of other endocrine, nutritional and metabolic diseases: Secondary | ICD-10-CM | POA: Insufficient documentation

## 2021-01-20 DIAGNOSIS — Z800 Family history of malignant neoplasm of digestive organs: Secondary | ICD-10-CM | POA: Diagnosis not present

## 2021-01-20 DIAGNOSIS — Z1211 Encounter for screening for malignant neoplasm of colon: Secondary | ICD-10-CM | POA: Diagnosis not present

## 2021-01-20 DIAGNOSIS — Z801 Family history of malignant neoplasm of trachea, bronchus and lung: Secondary | ICD-10-CM | POA: Diagnosis not present

## 2021-01-20 DIAGNOSIS — K6389 Other specified diseases of intestine: Secondary | ICD-10-CM | POA: Insufficient documentation

## 2021-01-20 DIAGNOSIS — Z888 Allergy status to other drugs, medicaments and biological substances status: Secondary | ICD-10-CM | POA: Insufficient documentation

## 2021-01-20 DIAGNOSIS — Z8249 Family history of ischemic heart disease and other diseases of the circulatory system: Secondary | ICD-10-CM | POA: Diagnosis not present

## 2021-01-20 HISTORY — PX: COLONOSCOPY WITH PROPOFOL: SHX5780

## 2021-01-20 SURGERY — COLONOSCOPY WITH PROPOFOL
Anesthesia: Monitor Anesthesia Care

## 2021-01-20 MED ORDER — LACTATED RINGERS IV SOLN
INTRAVENOUS | Status: AC | PRN
  Administered 2021-01-20: 1000 mL via INTRAVENOUS

## 2021-01-20 MED ORDER — PHENYLEPHRINE HCL (PRESSORS) 10 MG/ML IV SOLN
INTRAVENOUS | Status: AC
  Filled 2021-01-20: qty 1

## 2021-01-20 MED ORDER — PROPOFOL 1000 MG/100ML IV EMUL
INTRAVENOUS | Status: AC
  Filled 2021-01-20: qty 200

## 2021-01-20 MED ORDER — PROPOFOL 500 MG/50ML IV EMUL
INTRAVENOUS | Status: AC
  Filled 2021-01-20: qty 100

## 2021-01-20 MED ORDER — SODIUM CHLORIDE 0.9 % IV SOLN: INTRAVENOUS | Status: DC

## 2021-01-20 MED ORDER — LIDOCAINE 2% (20 MG/ML) 5 ML SYRINGE
INTRAMUSCULAR | Status: DC | PRN
  Administered 2021-01-20: 40 mg via INTRAVENOUS

## 2021-01-20 MED ORDER — PROPOFOL 10 MG/ML IV BOLUS
INTRAVENOUS | Status: DC | PRN
  Administered 2021-01-20 (×3): 20 mg via INTRAVENOUS
  Administered 2021-01-20: 10 mg via INTRAVENOUS
  Administered 2021-01-20 (×2): 20 mg via INTRAVENOUS
  Administered 2021-01-20: 50 mg via INTRAVENOUS
  Administered 2021-01-20 (×4): 20 mg via INTRAVENOUS
  Administered 2021-01-20 (×2): 50 mg via INTRAVENOUS

## 2021-01-20 SURGICAL SUPPLY — 21 items

## 2021-01-20 NOTE — Anesthesia Postprocedure Evaluation (Signed)
Anesthesia Post Note  Patient: Cynthia [name]  Procedure(s) Performed: COLONOSCOPY WITH PROPOFOL     Patient location during evaluation: PACU Anesthesia Type: MAC Level of consciousness: awake and alert Pain management: pain level controlled Vital Signs Assessment: post-procedure vital signs reviewed and stable Respiratory status: spontaneous breathing, nonlabored ventilation and respiratory function stable Cardiovascular status: blood pressure returned to baseline and stable Postop Assessment: no apparent nausea or vomiting Anesthetic complications: no   No notable events documented.  Last Vitals:  Vitals:   01/20/21 0807 01/20/21 0817  BP: 99/68 110/65  Pulse: 67 61  Resp: 13 14  Temp:    SpO2: 100% 100%    Last Pain:  Vitals:   01/20/21 0817  TempSrc:   PainSc: 0-No pain                 Pervis Hocking

## 2021-01-20 NOTE — Anesthesia Procedure Notes (Signed)
Procedure Name: MAC Date/Time: 01/20/2021 7:30 AM Performed by: Cynda Familia, CRNA Pre-anesthesia Checklist: Patient identified, Emergency Drugs available, Suction available, Patient being monitored and Timeout performed Patient Re-evaluated:Patient Re-evaluated prior to induction Oxygen Delivery Method: Simple face mask Placement Confirmation: positive ETCO2 and breath sounds checked- equal and bilateral Dental Injury: Teeth and Oropharynx as per pre-operative assessment

## 2021-01-20 NOTE — Discharge Instructions (Signed)

## 2021-01-20 NOTE — Op Note (Addendum)
[facility] Patient Name: Cynthia [name] Procedure Date: 01/20/2021 MRN: [id number] Attending MD: Carol Ada , MD Date of Birth: 19-Feb-1961 CSN: [id number] Age: 60 Admit Type: Outpatient Procedure:                Colonoscopy Indications:              Screening for colorectal malignant neoplasm Providers:                Carol Ada, MD, Kary Kos RN, RN, Terrall Laity, Faustina Mbumina, Technician Referring MD:              Medicines:                Propofol per Anesthesia Complications:            No immediate complications. Estimated Blood Loss:     Estimated blood loss: none. Procedure:                Pre-Anesthesia Assessment:                           - Prior to the procedure, a History and Physical                            was performed, and patient medications and                            allergies were reviewed. The patient's tolerance of                            previous anesthesia was also reviewed. The risks                            and benefits of the procedure and the sedation                            options and risks were discussed with the patient.                            All questions were answered, and informed consent                            was obtained. Prior Anticoagulants: The patient has                            taken no previous anticoagulant or antiplatelet                            agents. ASA Grade Assessment: III - A patient with                            severe systemic disease. After reviewing the risks  and benefits, the patient was deemed in                            satisfactory condition to undergo the procedure.                           - Sedation was administered by an anesthesia                            professional. Deep sedation was attained.                           After obtaining informed consent, the colonoscope                            was  passed under direct vision. Throughout the                            procedure, the patient's blood pressure, pulse, and                            oxygen saturations were monitored continuously. The                            [id number]) Olympus colonoscope was                            introduced through the anus and advanced to the the                            cecum, identified by appendiceal orifice and                            ileocecal valve. The colonoscopy was performed                            without difficulty. The patient tolerated the                            procedure well. The quality of the bowel                            preparation was good. The terminal ileum, ileocecal                            valve, appendiceal orifice, and rectum were                            photographed. Scope In: 7:36:04 AM Scope Out: 7:51:12 AM Scope Withdrawal Time: 0 hours 10 minutes 18 seconds  Total Procedure Duration: 0 hours 15 minutes 8 seconds  Findings:      A diffuse area of moderate melanosis was found in the entire colon. Impression:               - Melanosis in the  colon.                           - No specimens collected. Moderate Sedation:      Not Applicable - Patient had care per Anesthesia. Recommendation:           - Patient has a contact number available for                            emergencies. The signs and symptoms of potential                            delayed complications were discussed with the                            patient. Return to normal activities tomorrow.                            Written discharge instructions were provided to the                            patient.                           - Resume previous diet.                           - Continue present medications.                           - Repeat colonoscopy in 10 years for screening                            purposes. Procedure Code(s):        --- Professional  ---                           [phone], Colonoscopy, flexible; diagnostic, including                            collection of specimen(s) by brushing or washing,                            when performed (separate procedure) Diagnosis Code(s):        --- Professional ---                           Z12.11, Encounter for screening for malignant                            neoplasm of colon                           K63.89, Other specified diseases of intestine CPT copyright 2019 [employer]. All rights reserved. The codes documented in this report are preliminary and upon coder review may  be revised to meet current compliance requirements. Carol Ada, MD Carol Ada, MD  01/20/2021 8:08:30 AM This report has been signed electronically. Number of Addenda: 0

## 2021-01-20 NOTE — H&P (Signed)
Cynthia [name] HPI: This 60 year old white female presents to the office for evaluation of rectal bleeding that she feels may be from an anal fissure. She denies having any rectal pain. She is taking Linzess 290 mcg per day. She has 3 BM's per day. She feels she has a sense of complete evacuation only after she has had 3 BM's. She has post-prandial nausea which started 2 months ago. She has persistent LUQ. She is taking Dexilant for acid reflux with good control. She has good appetite and her weight has been stable. She denies having any complaints of vomiting, dysphagia or odynophagia. She denies having a family history of colon cancer, celiac sprue or IBD. Her last colonoscopy done on 04/16/2011  revealed internal hemorrhoids and diffuse melanosis coli; the exam was otherwise normal.  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   Acustic Neuroma - Brain tumor    GERD (gastroesophageal reflux disease)    History of diabetes mellitus    was prediabetes, off metformin   Iron deficiency anemia, unspecified 02/18/2013   Obesity    296 lbs (highest weight in 2012)    Tendonitis, Achilles, left     Past Surgical History:  Procedure Laterality Date   ABLATION     and bladder sling   ACHILLES TENDON REPAIR     x2   ANAL FISSURE REPAIR  2012   BACK SURGERY     CHOLECYSTECTOMY     CRANIOTOMY  09/13/2015   with excision of acoustic neuroma   ESOPHAGOGASTRODUODENOSCOPY (EGD) WITH PROPOFOL N/A 07/22/2020   Procedure: ESOPHAGOGASTRODUODENOSCOPY (EGD) WITH PROPOFOL;  Surgeon: Carol Ada, MD;  Location: [facility];  Service: Endoscopy;   Laterality: N/A;   IMPLANTATION BONE ANCHORED HEARING AID     MOUTH SURGERY  2015   3 implants   OVARIAN CYST REMOVAL     x4   THYROID LOBECTOMY Right 04/28/2015   Partial    TOE FUSION Left     Family History  Problem Relation Age of Onset   Thyroid disease Mother    Heart Problems Mother        vavle issue   Dystonia Mother    Hypercholesterolemia Mother    Heart attack Father    Cancer Father        esophageal   Heart disease Father    Lung cancer Paternal Grandfather    Heart disease Paternal Grandfather    Heart disease Maternal Grandfather    Heart disease Paternal Grandmother    Heart disease Other        paternal and maternal side   Cancer Brother        esophageal/stomach cancer    Social History:  reports that she has never smoked. She has never used smokeless tobacco. She reports current alcohol use of about 1.0 - 2.0 standard drink of alcohol per week. She reports that she does not use drugs.  Allergies:  Allergies  Allergen Reactions   Mobic [Meloxicam] Nausea And Vomiting   Multivitamins Other (See Comments)  GI- upset    Medications: Scheduled: Continuous:  sodium chloride      No results found for this or any previous visit (from the past 24 hour(s)).   No results found.  ROS:  As stated above in the HPI otherwise negative.  Blood pressure 135/67, temperature 97.9 F (36.6 C), temperature source Temporal, resp. rate 14, height '5\' 8"'$  (1.727 m), weight 88.5 kg, SpO2 100 %.    PE: Gen: NAD, Alert and Oriented HEENT:  [place]/AT, EOMI Neck: Supple, no LAD Lungs: CTA Bilaterally CV: RRR without M/G/R ABD: Soft, NTND, +BS Ext: No C/C/E  Assessment/Plan: 1) Screening colonoscopy.  Andris Brothers D 01/20/2021, 7:02 AM

## 2021-01-20 NOTE — Transfer of Care (Signed)
Immediate Anesthesia Transfer of Care Note  Patient: Cynthia [name]  Procedure(s) Performed: COLONOSCOPY WITH PROPOFOL  Patient Location: PACU and Endoscopy Unit  Anesthesia Type:MAC  Level of Consciousness: sedated  Airway & Oxygen Therapy: Patient Spontanous Breathing and Patient connected to face mask oxygen  Post-op Assessment: Report given to RN and Post -op Vital signs reviewed and stable  Post vital signs: Reviewed and stable  Last Vitals:  Vitals Value Taken Time  BP 93/50 01/20/21 0800  Temp    Pulse 72 01/20/21 0802  Resp 15 01/20/21 0802  SpO2 100 % 01/20/21 0802  Vitals shown include unvalidated device data.  Last Pain:  Vitals:   01/20/21 0654  TempSrc: Temporal  PainSc: 0-No pain         Complications: No notable events documented.

## 2021-01-20 NOTE — Anesthesia Preprocedure Evaluation (Signed)
Anesthesia Evaluation  Patient identified by MRN, date of birth, ID band Patient awake    Reviewed: Allergy & Precautions, NPO status , Patient's Chart, lab work & pertinent test results  History of Anesthesia Complications (+) DIFFICULT AIRWAY, AWARENESS UNDER ANESTHESIA and history of anesthetic complications  Airway Mallampati: II  TM Distance: >3 FB Neck ROM: Full    Dental no notable dental hx.    Pulmonary asthma ,    Pulmonary exam normal breath sounds clear to auscultation       Cardiovascular hypertension (not on meds anymore), Normal cardiovascular exam Rhythm:Regular Rate:Normal     Neuro/Psych PSYCHIATRIC DISORDERS (ADD) Balance issues after acoustic neuroma     GI/Hepatic Neg liver ROS, hiatal hernia, GERD  Medicated and Controlled,  Endo/Other  diabetes (pre-diabetic)Hypothyroidism   Renal/GU negative Renal ROS  negative genitourinary   Musculoskeletal Chronic back pain   Abdominal   Peds negative pediatric ROS (+)  Hematology negative hematology ROS (+)   Anesthesia Other Findings   Reproductive/Obstetrics negative OB ROS                             Anesthesia Physical Anesthesia Plan  ASA: 2  Anesthesia Plan: MAC   Post-op Pain Management:    Induction:   PONV Risk Score and Plan: 2 and Propofol infusion and TIVA  Airway Management Planned: Natural Airway and Simple Face Mask  Additional Equipment: None  Intra-op Plan:   Post-operative Plan:   Informed Consent: I have reviewed the patients History and Physical, chart, labs and discussed the procedure including the risks, benefits and alternatives for the proposed anesthesia with the patient or authorized representative who has indicated his/her understanding and acceptance.       Plan Discussed with: CRNA  Anesthesia Plan Comments:         Anesthesia Quick Evaluation

## 2021-01-24 ENCOUNTER — Encounter (HOSPITAL_COMMUNITY): Payer: Self-pay | Admitting: Gastroenterology

## 2021-02-01 ENCOUNTER — Other Ambulatory Visit: Payer: Self-pay

## 2021-02-16 ENCOUNTER — Other Ambulatory Visit: Payer: Self-pay | Admitting: Physician Assistant

## 2021-02-16 DIAGNOSIS — R454 Irritability and anger: Secondary | ICD-10-CM

## 2021-03-16 ENCOUNTER — Ambulatory Visit (INDEPENDENT_AMBULATORY_CARE_PROVIDER_SITE_OTHER): Payer: BC Managed Care – PPO | Admitting: Physician Assistant

## 2021-03-16 ENCOUNTER — Other Ambulatory Visit: Payer: Self-pay

## 2021-03-16 ENCOUNTER — Encounter: Payer: Self-pay | Admitting: Physician Assistant

## 2021-03-16 VITALS — BP 116/66 | HR 70 | Ht 68.0 in | Wt 202.0 lb

## 2021-03-16 DIAGNOSIS — E876 Hypokalemia: Secondary | ICD-10-CM

## 2021-03-16 DIAGNOSIS — Z23 Encounter for immunization: Secondary | ICD-10-CM | POA: Diagnosis not present

## 2021-03-16 DIAGNOSIS — G5632 Lesion of radial nerve, left upper limb: Secondary | ICD-10-CM

## 2021-03-16 NOTE — Progress Notes (Signed)
Subjective:    Patient ID: Cynthia [name], female    DOB: 1961/04/05, 59 y.o.   MRN: [id number]  HPI Pt is a 60 yo female who presents to the clinic with left forearm pain and numbness into left thumb. She feel after covid infection in July on both wrist but broke her right wrist. She did not have this pain until 2-3 weeks ago. No recent trauma. She does notice it worsens with sustained flexion of the left wrist like with holding a book to read. No swelling. NSAIDs not really helping.   Last labs showed potassium to be low. Will recheck today.   .. 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   Hashimoto's thyroiditis 06/14/2015   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[place]) 12/06/2015   Deafness in left ear 12/06/2015   Chronic left-sided low back pain 09/19/2016   Fatty liver disease, nonalcoholic 47/82/9562   Left renal mass 03/26/2017   Obesity (BMI 30.0-34.9) 03/26/2017   Compression of right radial nerve 06/26/2017   Motion sickness 12/26/2017   Family history of osteoporosis 03/05/2019   Post-menopausal 03/05/2019   Atrophic vaginitis 06/03/2019   Basal cell carcinoma of left upper arm 10/21/2019   Hearing loss of right ear 11/24/2019   Tremor of right hand 02/26/2020   Chronic gastritis without bleeding 05/27/2020   Hiatal hernia 05/27/2020   Nausea 05/27/2020   History of endometrial ablation 10/28/2020   History of partial thyroidectomy 10/28/2020   Right-sided chest wall pain 11/28/2020   Irritation of left radial nerve 03/16/2021    Resolved Ambulatory Problems    Diagnosis Date Noted   IFG (impaired fasting glucose) 09/13/2013   Backache 09/13/2013   Wheezing 09/13/2013   Swelling of limb 09/13/2013   Obesity, unspecified 09/13/2013   Low back pain 05/11/2014   Thyromegaly 02/13/2015   Subconjunctival hemorrhage of right eye 06/28/2015   Acoustic neuroma ([place]) 12/10/2014   Left ear hearing loss 10/14/2015   Costochondritis 03/21/2016   Irritable 10/17/2016   Thyroid adenoma 05/03/2015   Exposure to influenza 05/07/2018   Accidental fall 10/27/2018   Past Medical History:  Diagnosis Date   Asthma    Awareness under anesthesia    Balance problem    Chronic back pain    Complication of anesthesia    Constipation, chronic    Difficult airway    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     Objective:   Physical Exam Vitals reviewed.  Constitutional:      Appearance: Normal appearance.  HENT:     Head: Normocephalic.  Cardiovascular:     Rate and Rhythm: Normal rate.  Musculoskeletal:     Comments: NROM of left wrist with no pain.  Discomfort in anterior left forearm with resisted flexion.  Pinpoint pain with radiation into left thumb with palpation over anterior left mid forearm.  Hand grip 5/5.  Negative phalens and tinels and finklestein.  No swelling,  redness, warmth.  No tenderness over left wrist or joints of thumb or hand.   Neurological:     Mental Status: She is alert.      Flu shot given in the right gluteus medius.     Assessment & Plan:  [name] KitchenMarland KitchenDonnice was seen today for hand pain.  Diagnoses and all orders for this visit:  Irritation of left radial nerve -     US SOFT TISSUE UPPER EXTREMITY LIMITED LEFT (NON-VASCULAR); Future  Flu vaccine need -     Flu Vaccine QUAD 52mo+IM (Fluarix, Fluzone & Alfiuria Quad PF)  Hypokalemia -      Potassium   Unclear what is causing the entrapment/injury/irritation of the radial nerve but there is pinpoint tenderness in the anterior forearm likely over flexor muscles.  Will get ultrasound to look for cyst/mass.  Use diclofenac gel over area of pain. [place] for oral NSAID as well. Pt is sensitive to NSAIDs. Ice area.  May need to consider immobilization at wrist to allow for healing.  May need to consider follow up with Dr. Darene Lamer.   Flu shot given.

## 2021-03-16 NOTE — Patient Instructions (Addendum)
Ultrasound and diclofenac gel.

## 2021-03-20 ENCOUNTER — Ambulatory Visit (INDEPENDENT_AMBULATORY_CARE_PROVIDER_SITE_OTHER): Payer: BC Managed Care – PPO

## 2021-03-20 ENCOUNTER — Other Ambulatory Visit: Payer: Self-pay

## 2021-03-20 ENCOUNTER — Encounter: Payer: Self-pay | Admitting: Physician Assistant

## 2021-03-20 DIAGNOSIS — M79632 Pain in left forearm: Secondary | ICD-10-CM

## 2021-03-20 DIAGNOSIS — G5632 Lesion of radial nerve, left upper limb: Secondary | ICD-10-CM

## 2021-03-20 IMAGING — US US EXTREM UP*L* LTD
1 series · 8 of 8 positions shown · non-contrast
Comparison: None.

CLINICAL DATA: Left anterior forearm pain

EXAM:
ULTRASOUND LEFT UPPER EXTREMITY LIMITED
TECHNIQUE: Ultrasound examination of the upper extremity soft tissues was
performed in the area of clinical concern.

[Series 1: us soft tissue upper extremity limited left (non-v · 8 acquisitions, 8 frames shown]
[im 1/8]
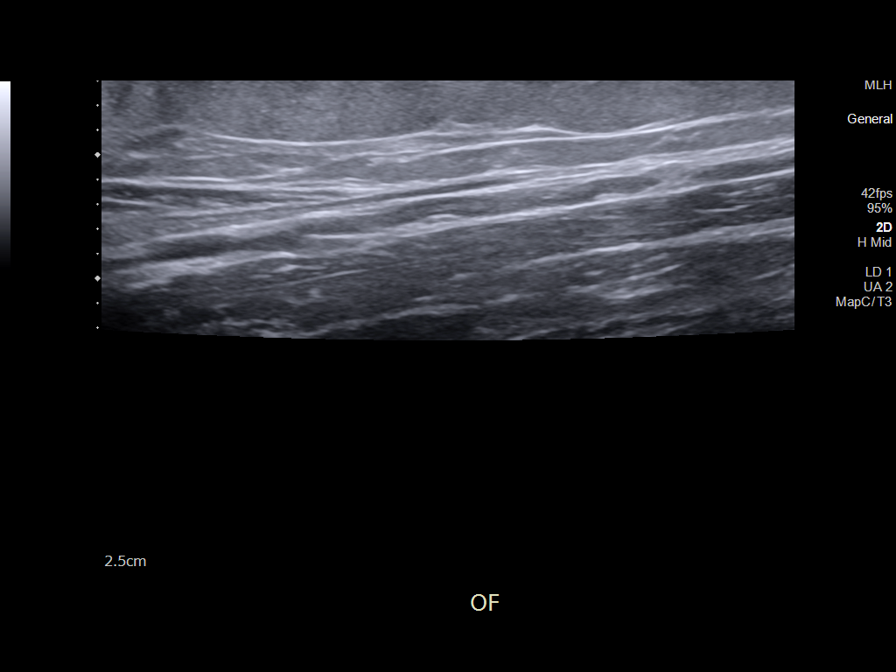
[im 2/8]
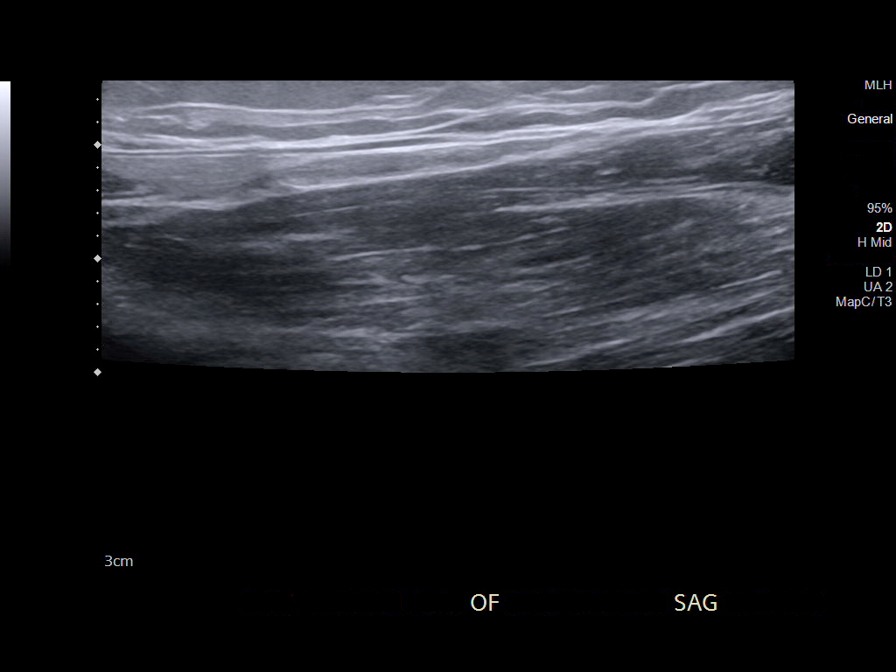
[im 3/8]
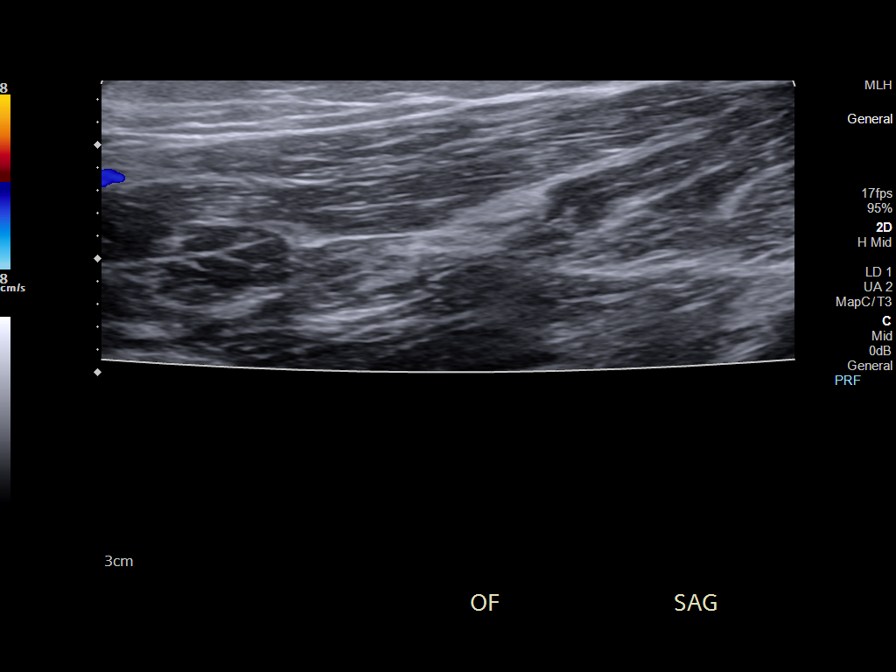
[im 4/8]
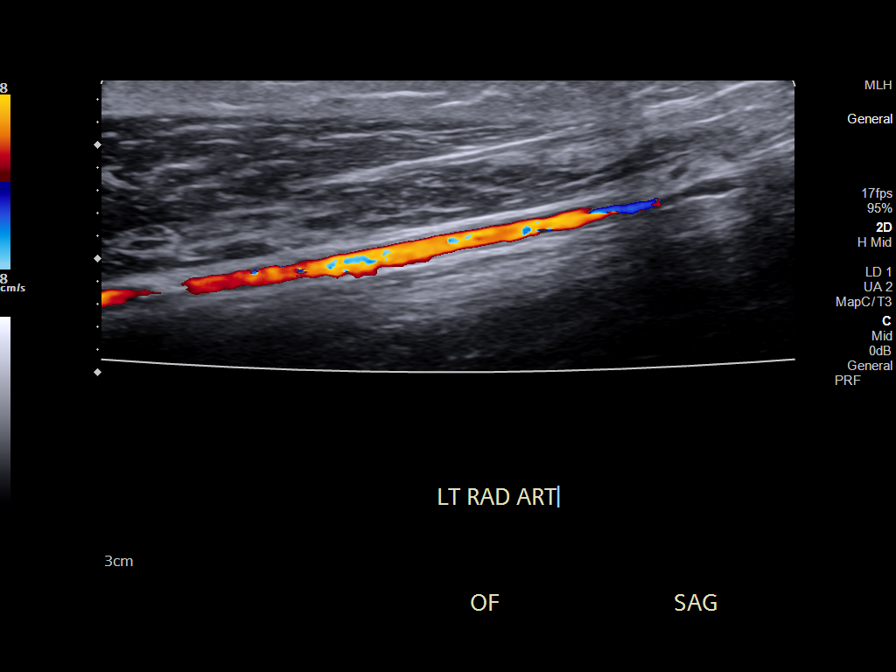
[im 5/8]
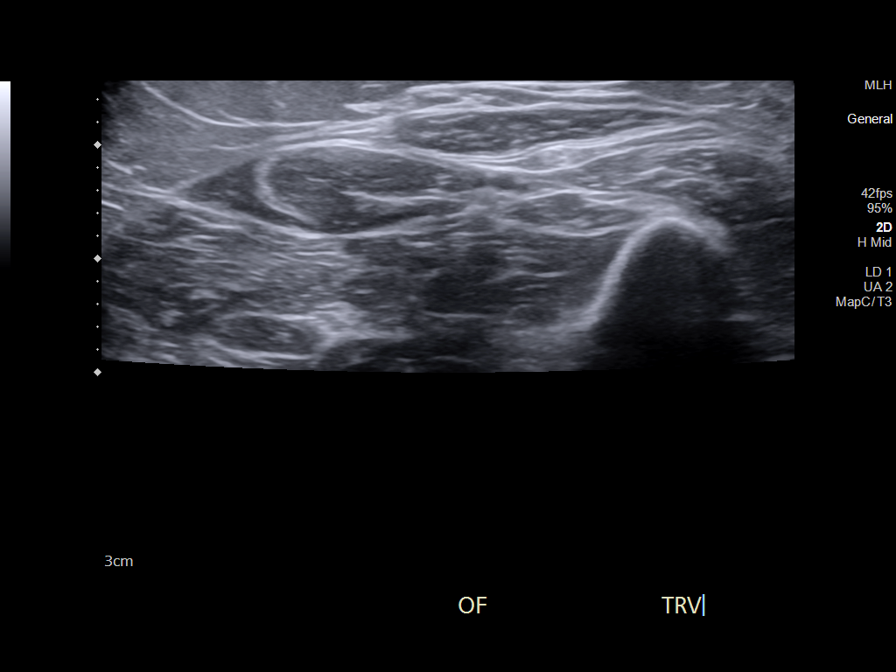
[im 6/8]
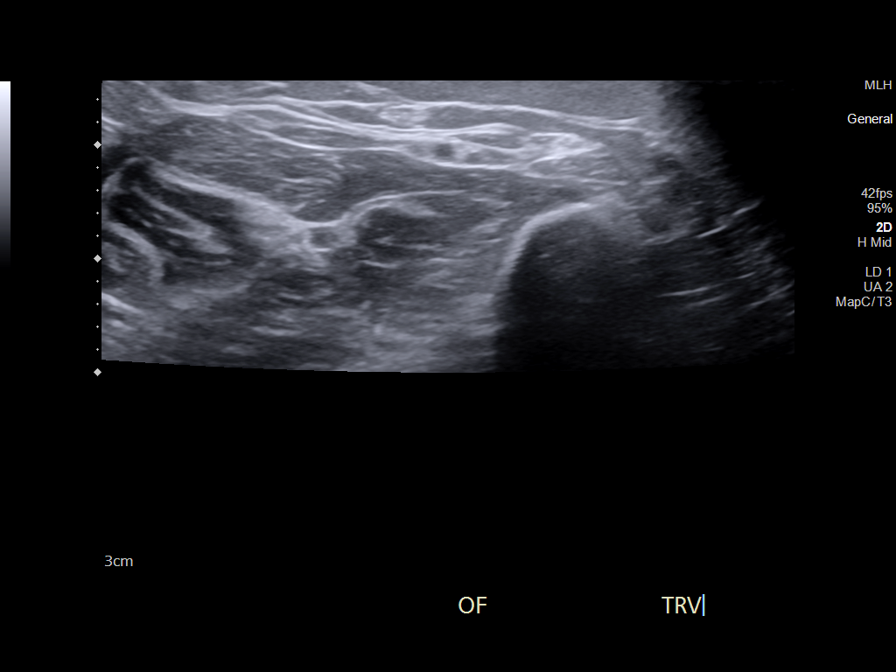
[im 7/8]
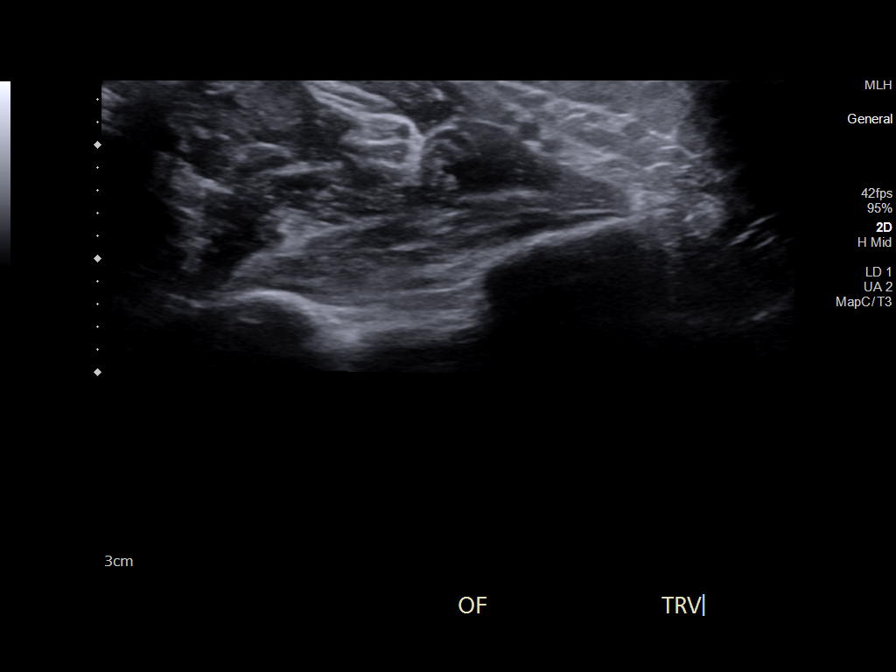
[im 8/8]
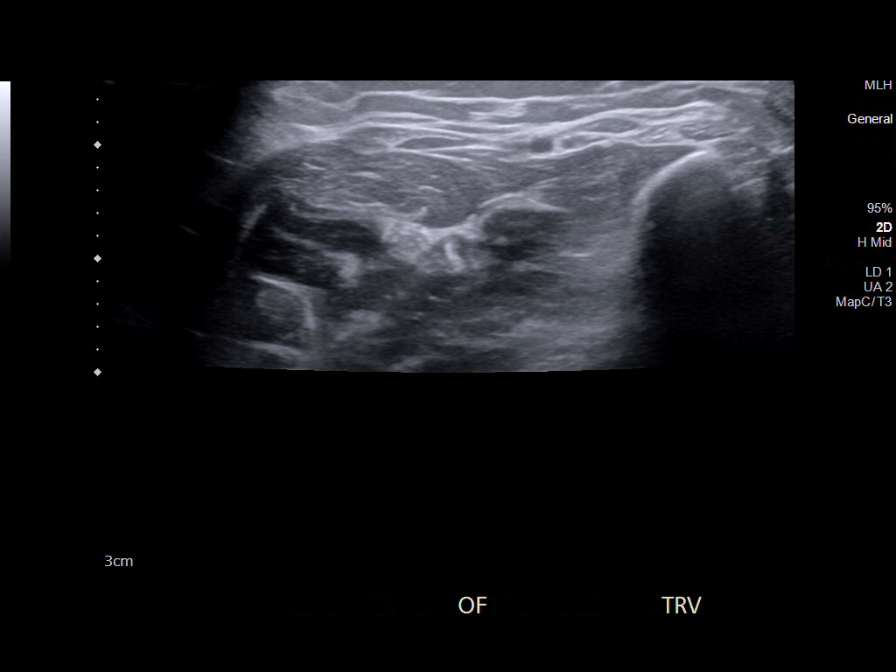

[8 of 8 positions shown; findings below may reference images not displayed]

FINDINGS: No solid or cystic mass.  No fluid collection or hematoma.
IMPRESSION: No focal sonographic abnormality in the left forearm.

## 2021-03-23 LAB — POTASSIUM: Potassium: 3.4 mmol/L — ABNORMAL LOW (ref 3.5–5.3)

## 2021-03-23 NOTE — Progress Notes (Signed)
Cynthia [name],   Good news is there is no solid or cystic mass. I still think your symptoms represent some radial nerve entrapment. Is the gel helping at all? I would get a splint that imbolizes your wrist and forearm(like one you wore with carpel tunnel). Ice and massage area of pain in forearm regularly. Consider using a tens unit over area. We could try some physical therapy as well. Can you take any NSAID without it bothering your stomach?

## 2021-03-23 NOTE — Progress Notes (Signed)
Potassium is still just a hair low. Start a potassium supplement 99mg  OTC to help give you extra potassium. Recheck at next office visit.

## 2021-03-24 ENCOUNTER — Encounter: Payer: Self-pay | Admitting: Physician Assistant

## 2021-03-30 ENCOUNTER — Other Ambulatory Visit: Payer: Self-pay | Admitting: Physician Assistant

## 2021-03-30 DIAGNOSIS — I10 Essential (primary) hypertension: Secondary | ICD-10-CM

## 2021-04-09 ENCOUNTER — Other Ambulatory Visit: Payer: Self-pay | Admitting: Physician Assistant

## 2021-04-11 ENCOUNTER — Other Ambulatory Visit: Payer: Self-pay

## 2021-04-11 DIAGNOSIS — R454 Irritability and anger: Secondary | ICD-10-CM

## 2021-04-11 MED ORDER — BUPROPION HCL ER (XL) 300 MG PO TB24: ORAL_TABLET | ORAL | 1 refills | Status: DC

## 2021-04-12 ENCOUNTER — Other Ambulatory Visit: Payer: Self-pay | Admitting: Physician Assistant

## 2021-04-12 DIAGNOSIS — M545 Low back pain, unspecified: Secondary | ICD-10-CM

## 2021-04-13 ENCOUNTER — Encounter: Payer: Self-pay | Admitting: Physician Assistant

## 2021-04-13 DIAGNOSIS — F900 Attention-deficit hyperactivity disorder, predominantly inattentive type: Secondary | ICD-10-CM

## 2021-04-13 NOTE — Telephone Encounter (Signed)
Patient also left vm requesting this late yesterday Last appt in September Last written 01/28/2021 #30 no refills

## 2021-04-14 MED ORDER — AMPHETAMINE-DEXTROAMPHET ER 30 MG PO CP24: 30.0000 mg | ORAL_CAPSULE | Freq: Every day | ORAL | 0 refills | Status: DC

## 2021-04-25 ENCOUNTER — Other Ambulatory Visit: Payer: Self-pay | Admitting: Neurosurgery

## 2021-04-25 DIAGNOSIS — M5416 Radiculopathy, lumbar region: Secondary | ICD-10-CM

## 2021-05-08 ENCOUNTER — Other Ambulatory Visit: Payer: Self-pay

## 2021-05-08 ENCOUNTER — Ambulatory Visit
Admission: RE | Admit: 2021-05-08 | Discharge: 2021-05-08 | Disposition: A | Discharge status: Home / Self Care | Payer: BC Managed Care – PPO | Source: Ambulatory Visit | Attending: Neurosurgery | Admitting: Neurosurgery

## 2021-05-08 ENCOUNTER — Telehealth: Payer: Self-pay

## 2021-05-08 DIAGNOSIS — M5416 Radiculopathy, lumbar region: Secondary | ICD-10-CM

## 2021-05-08 IMAGING — XA DG EPIDURAL NERVE ROOT
2 series · 2 of 2 positions shown · non-contrast
Comparison: none

CLINICAL DATA: Lumbosacral spondylosis without myelopathy with
radiculopathy. Left hip and leg pain. 100% relief for over 6 months
after left L5 nerve root block in [place]. Symptoms have recurred.

[Series 1: ortho standard · 1 of 1 slices shown (1 of 2)]
[im 1/1]
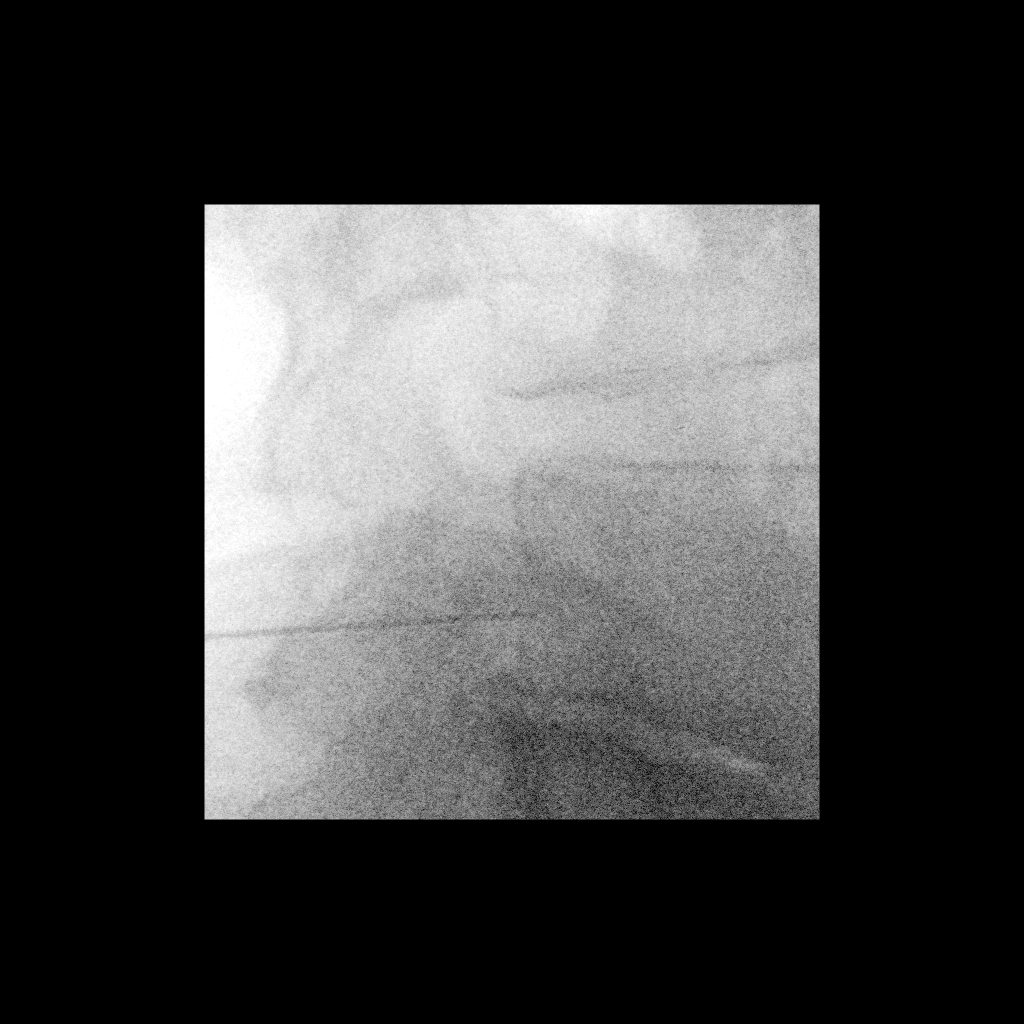

[Series 2: ortho standard · 1 of 1 slices shown (2 of 2)]
[im 1/1]
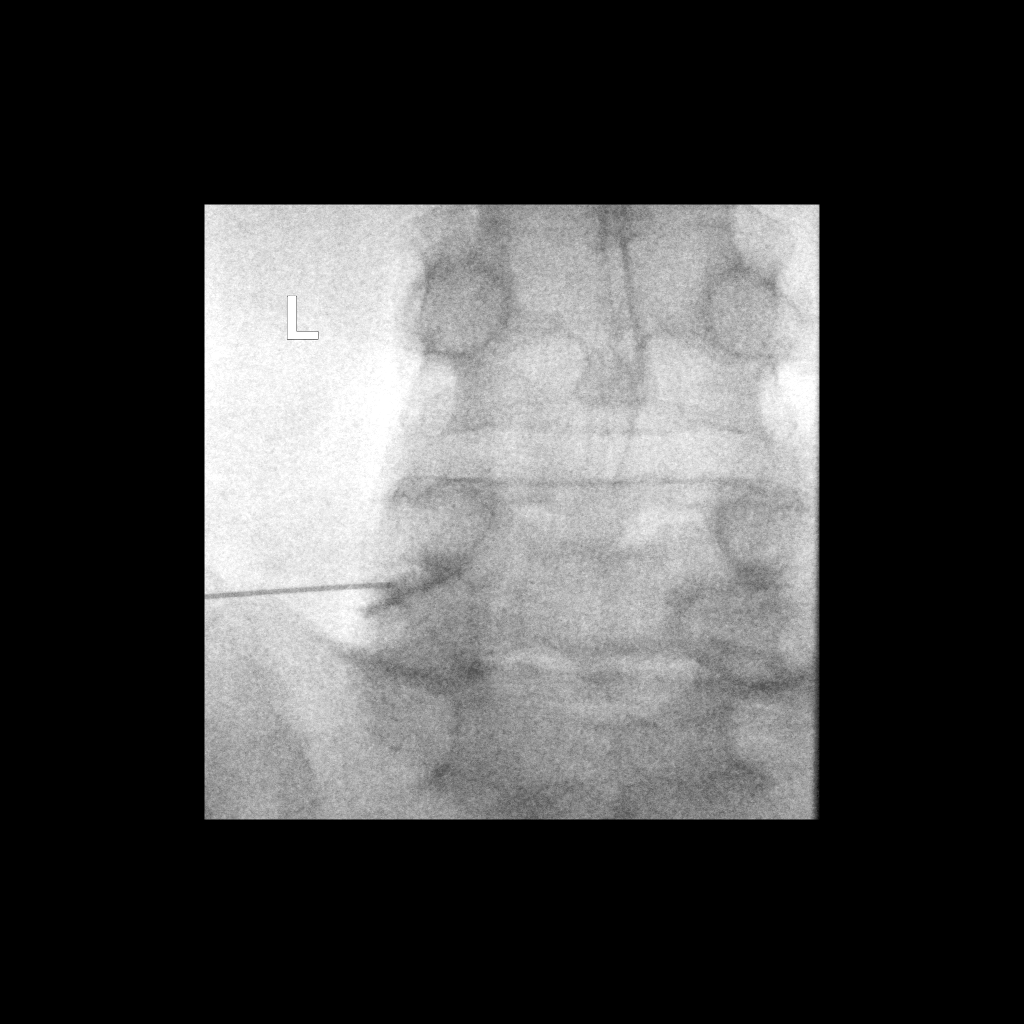

[2 of 2 positions shown; findings below may reference images not displayed]

EXAM:
EPIDURAL/NERVE ROOT

FLUOROSCOPY TIME:  Radiation Exposure Index (as provided by the
fluoroscopic device): 4.2 mGy

Fluoroscopy Time:  28 seconds

Number of Acquired Images:  0

PROCEDURE:
The procedure, risks, benefits, and alternatives were explained to
the patient. Questions regarding the procedure were encouraged and
answered. The patient understands and consents to the procedure.

LEFT L5 NERVE ROOT BLOCK AND TRANSFORAMINAL EPIDURAL: A posterior
oblique approach was taken to the intervertebral foramen on the left
at L5-S1 using a 5 inch 22 gauge spinal needle. Injection of Isovue
M 200 outlined the left L5 nerve root and showed good epidural
spread. No vascular opacification is seen. 80 mg of Depo-Medrol
mixed with 2 mL of 1% lidocaine were instilled. The procedure was
well-tolerated, and the patient was discharged thirty minutes
following the injection in good condition.

COMPLICATIONS:
None immediate.
IMPRESSION: Technically successful injection consisting of a left L5 nerve root
block and transforaminal epidural.

## 2021-05-08 MED ORDER — IOPAMIDOL (ISOVUE-M 200) INJECTION 41%: 1.0000 mL | Freq: Once | INTRAMUSCULAR | Status: DC

## 2021-05-08 MED ORDER — METHYLPREDNISOLONE ACETATE 40 MG/ML INJ SUSP (RADIOLOG: 80.0000 mg | Freq: Once | INTRAMUSCULAR | Status: DC

## 2021-05-08 NOTE — Telephone Encounter (Signed)
Medication: celecoxib (CELEBREX) 200 MG capsule Prior authorization submitted via CoverMyMeds on 05/08/2021 [place] submission pending

## 2021-05-08 NOTE — Discharge Instructions (Signed)

## 2021-05-30 ENCOUNTER — Encounter: Payer: Self-pay | Admitting: Physician Assistant

## 2021-05-30 ENCOUNTER — Ambulatory Visit: Payer: BC Managed Care – PPO | Admitting: Physician Assistant

## 2021-05-30 ENCOUNTER — Other Ambulatory Visit: Payer: Self-pay

## 2021-05-30 DIAGNOSIS — F900 Attention-deficit hyperactivity disorder, predominantly inattentive type: Secondary | ICD-10-CM

## 2021-05-30 DIAGNOSIS — G8929 Other chronic pain: Secondary | ICD-10-CM

## 2021-05-30 DIAGNOSIS — M545 Low back pain, unspecified: Secondary | ICD-10-CM

## 2021-05-30 DIAGNOSIS — R454 Irritability and anger: Secondary | ICD-10-CM | POA: Diagnosis not present

## 2021-05-30 DIAGNOSIS — E063 Autoimmune thyroiditis: Secondary | ICD-10-CM

## 2021-05-30 NOTE — Progress Notes (Signed)
Subjective:    Patient ID: Cynthia [name], female    DOB: 05/22/1961, 60 y.o.   MRN: [id number]  HPI Pt is a 60 yo obese female with ADD, HTN, Asthma, irritable, chronic right sided back pain and hypothyroidism who presents to the clinic for medication refills.   Hypothyroidism-taking medication and doing great. No concerns.   ADD- on adderall daily and no problems. Sleeping well and mood is good.   No problems breathing.   Needs refills on flexeril for prn with low ack pain.   Mood is better on wellbutrin. Needs refill.   .. 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   Hashimoto's thyroiditis 06/14/2015   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[place]) 12/06/2015   Deafness in left ear 12/06/2015   Chronic left-sided low back pain 09/19/2016   Fatty liver disease, nonalcoholic 84/13/2440   Left renal mass 03/26/2017   Obesity (BMI 30.0-34.9) 03/26/2017   Compression of right radial nerve 06/26/2017   Motion sickness 12/26/2017   Family history of osteoporosis 03/05/2019   Post-menopausal 03/05/2019   Atrophic vaginitis 06/03/2019   Basal cell carcinoma of left upper arm 10/21/2019   Hearing loss of right ear 11/24/2019   Tremor of right hand 02/26/2020   Chronic gastritis without bleeding 05/27/2020   Hiatal hernia 05/27/2020   Nausea 05/27/2020   History of endometrial ablation 10/28/2020   History of partial thyroidectomy 10/28/2020   Right-sided chest wall pain 11/28/2020   Irritation of left radial nerve 03/16/2021   Resolved  Ambulatory Problems    Diagnosis Date Noted   IFG (impaired fasting glucose) 09/13/2013   Backache 09/13/2013   Wheezing 09/13/2013   Swelling of limb 09/13/2013   Obesity, unspecified 09/13/2013   Low back pain 05/11/2014   Thyromegaly 02/13/2015   Subconjunctival hemorrhage of right eye 06/28/2015   Acoustic neuroma ([place]) 12/10/2014   Left ear hearing loss 10/14/2015   Costochondritis 03/21/2016   Irritable 10/17/2016   Thyroid adenoma 05/03/2015   Exposure to influenza 05/07/2018   Accidental fall 10/27/2018   Past Medical History:  Diagnosis Date   Asthma    Awareness under anesthesia    Balance problem    Chronic back pain    Complication of anesthesia    Constipation, chronic    Difficult airway    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     Objective:   Physical Exam Vitals reviewed.  Constitutional:      Appearance: Normal appearance. She is obese.  HENT:     Head: Normocephalic.  Neck:     Vascular: No carotid bruit.  Cardiovascular:     Rate and Rhythm: Normal rate and regular rhythm.     Pulses: Normal pulses.     Heart sounds: Normal heart sounds.  Pulmonary:     Effort: Pulmonary effort is normal.     Breath sounds: Normal breath sounds.  Musculoskeletal:     Cervical  back: No tenderness.  Lymphadenopathy:     Cervical: No cervical adenopathy.  Neurological:     General: No focal deficit present.     Mental Status: She is alert and oriented to person, place, and time.  Psychiatric:        Mood and Affect: Mood normal.   .. Depression screen [place] 2/9 05/30/2021 11/28/2020 05/27/2020 02/26/2020 08/24/2019  Decreased Interest 0 0 0 0 0  Down, Depressed, Hopeless 0 0 1 0 0  PHQ - 2 Score 0 0 1 0 0  Altered sleeping - 1 1 1 1   Tired, decreased energy - 0 0 2 1  Change in appetite - 0 1 1 1   Feeling bad or failure  about yourself  - 0 0 0 0  Trouble concentrating - 2 0 2 0  Moving slowly or fidgety/restless - 0 0 0 1  Suicidal thoughts - 0 0 0 0  PHQ-9 Score - 3 3 6 4   Difficult doing work/chores - Not difficult at all - Not difficult at all Not difficult at all   .[name] Kitchen          Assessment & Plan:  [name] KitchenMarland KitchenGeorgeanne was seen today for follow-up.  Diagnoses and all orders for this visit:  Attention deficit hyperactivity disorder (ADHD), predominantly inattentive type -     amphetamine-dextroamphetamine (ADDERALL XR) 30 MG 24 hr capsule; Take 1 capsule (30 mg total) by mouth daily. -     amphetamine-dextroamphetamine (ADDERALL XR) 30 MG 24 hr capsule; Take 1 capsule (30 mg total) by mouth daily. -     amphetamine-dextroamphetamine (ADDERALL XR) 30 MG 24 hr capsule; Take 1 capsule (30 mg total) by mouth daily.  Chronic left-sided low back pain without sciatica -     cyclobenzaprine (FLEXERIL) 10 MG tablet; Take 1 tablet (10 mg total) by mouth at bedtime.  Hashimoto's thyroiditis -     levothyroxine (SYNTHROID) 75 MCG tablet; Take 1 tablet (75 mcg total) by mouth daily before breakfast.  Irritable -     buPROPion (WELLBUTRIN XL) 300 MG 24 hr tablet; TAKE 1 TABLET DAILY .   Adderall refilled.  Follow up 6 months.   TSH up to date. Refilled levothyroxine.   Refilled wellbutrin. Mood controlled.

## 2021-06-05 ENCOUNTER — Other Ambulatory Visit: Payer: Self-pay | Admitting: Physician Assistant

## 2021-06-05 ENCOUNTER — Encounter: Payer: Self-pay | Admitting: Physician Assistant

## 2021-06-05 DIAGNOSIS — E063 Autoimmune thyroiditis: Secondary | ICD-10-CM

## 2021-06-05 MED ORDER — AMPHETAMINE-DEXTROAMPHET ER 30 MG PO CP24: 30.0000 mg | ORAL_CAPSULE | Freq: Every day | ORAL | 0 refills | Status: DC

## 2021-06-05 MED ORDER — BUPROPION HCL ER (XL) 300 MG PO TB24: ORAL_TABLET | ORAL | 1 refills | Status: DC

## 2021-06-05 MED ORDER — CYCLOBENZAPRINE HCL 10 MG PO TABS
10.0000 mg | ORAL_TABLET | Freq: Every day | ORAL | 1 refills | Status: DC
Start: 2021-06-05 — End: 2021-11-28

## 2021-06-05 MED ORDER — LEVOTHYROXINE SODIUM 75 MCG PO TABS: 75.0000 ug | ORAL_TABLET | Freq: Every day | ORAL | 1 refills | Status: DC

## 2021-07-10 ENCOUNTER — Encounter: Payer: Self-pay | Admitting: Physician Assistant

## 2021-07-10 MED ORDER — WEGOVY 1 MG/0.5ML ~~LOC~~ SOAJ: 1.0000 mg | SUBCUTANEOUS | 0 refills | Status: DC

## 2021-07-10 MED ORDER — WEGOVY 0.5 MG/0.5ML ~~LOC~~ SOAJ: 0.5000 mg | SUBCUTANEOUS | 0 refills | Status: DC

## 2021-07-10 MED ORDER — WEGOVY 0.25 MG/0.5ML ~~LOC~~ SOAJ: 0.2500 mg | SUBCUTANEOUS | 0 refills | Status: DC

## 2021-07-12 ENCOUNTER — Telehealth: Payer: Self-pay

## 2021-07-12 NOTE — Telephone Encounter (Signed)
Medication: Semaglutide-Weight Management (WEGOVY) 0.25 MG/0.5ML SOAJ Prior authorization submitted via CoverMyMeds on 07/12/2021 [place] submission pending

## 2021-07-12 NOTE — Telephone Encounter (Signed)
Medication: Semaglutide-Weight Management (WEGOVY) 0.25 MG/0.5ML SOAJ Prior authorization determination received Medication has been approved Approval dates: 06/12/2021-02/07/2022  Patient aware via: [place] aware: Yes Provider aware via this encounter

## 2021-07-24 ENCOUNTER — Other Ambulatory Visit: Payer: Self-pay | Admitting: Neurology

## 2021-07-24 NOTE — Telephone Encounter (Signed)
Patient called and left a vm stating her pharmacy does not have Adderall available. She has called around to several pharmacies and it is also unavailable. She is unsure what to do at this point. She takes Adderall 30 mg XR.

## 2021-07-25 MED ORDER — LISDEXAMFETAMINE DIMESYLATE 30 MG PO CAPS: 30.0000 mg | ORAL_CAPSULE | Freq: Every day | ORAL | 0 refills | Status: DC

## 2021-07-25 NOTE — Telephone Encounter (Signed)
Spoke with patient, she would like Vyvanse sent, she has been on this in the past and it worked "okay". She is okay with this in the interim during the shortage.

## 2021-08-02 ENCOUNTER — Emergency Department (HOSPITAL_BASED_OUTPATIENT_CLINIC_OR_DEPARTMENT_OTHER)
Admission: EM | Admit: 2021-08-02 | Discharge: 2021-08-02 | Disposition: A | Discharge status: Home / Self Care | Payer: BC Managed Care – PPO | Attending: Emergency Medicine | Admitting: Emergency Medicine

## 2021-08-02 ENCOUNTER — Other Ambulatory Visit: Payer: Self-pay

## 2021-08-02 ENCOUNTER — Encounter (HOSPITAL_BASED_OUTPATIENT_CLINIC_OR_DEPARTMENT_OTHER): Payer: Self-pay

## 2021-08-02 DIAGNOSIS — S01111A Laceration without foreign body of right eyelid and periocular area, initial encounter: Secondary | ICD-10-CM | POA: Diagnosis not present

## 2021-08-02 DIAGNOSIS — W01198A Fall on same level from slipping, tripping and stumbling with subsequent striking against other object, initial encounter: Secondary | ICD-10-CM | POA: Diagnosis not present

## 2021-08-02 DIAGNOSIS — S0993XA Unspecified injury of face, initial encounter: Secondary | ICD-10-CM | POA: Diagnosis present

## 2021-08-02 DIAGNOSIS — S0010XA Contusion of unspecified eyelid and periocular area, initial encounter: Secondary | ICD-10-CM

## 2021-08-02 MED ORDER — LIDOCAINE-EPINEPHRINE-TETRACAINE (LET) TOPICAL GEL: 3.0000 mL | Freq: Once | TOPICAL | Status: DC

## 2021-08-02 MED ORDER — LIDOCAINE-EPINEPHRINE (PF) 2 %-1:200000 IJ SOLN: 10.0000 mL | Freq: Once | INTRAMUSCULAR | Status: DC

## 2021-08-02 NOTE — ED Triage Notes (Signed)
Pt to er, pt states that she tripped and hit her head on a door, states that she has a hx of a brain tumor and has issues with balance, states that she didn't get dizzy or pass out.  Pt has aprox 3/4 inch lac to R upper orbit.

## 2021-08-02 NOTE — ED Provider Notes (Signed)
[facility] Provider Note   CSN: [id number] Arrival date & time: 08/02/21  0740     History  Chief Complaint  Patient presents with   Cynthia [name]    Cynthia [name] is a 61 y.o. female.   Fall  61 year old female with medical history significant for acoustic neuroma, vestibular schwannoma s/p resection with chronic balance issues and gait issues who presents to the emergency department after a mechanical fall.  Patient states that she experienced a mechanical fall when she tripped and struck her right face on a door.  No loss of consciousness.  She sustained a 2 cm laceration to her eyelid that is hemostatic.  Her last tetanus was in 2018 within the last 5 years.  She denies any other injuries or complaints.  She arrived ABC intact, GCS 15.    Home Medications Prior to Admission medications   Medication Sig Start Date End Date Taking? Authorizing Provider  albuterol (VENTOLIN HFA) 108 (90 Base) MCG/ACT inhaler Inhale 2 puffs into the lungs every 6 (six) hours as needed for wheezing. 12/22/20   Terrilyn Saver, NP  amphetamine-dextroamphetamine (ADDERALL XR) 30 MG 24 hr capsule Take 1 capsule (30 mg total) by mouth daily. 07/29/21   Breeback, Royetta Car, PA-C  amphetamine-dextroamphetamine (ADDERALL XR) 30 MG 24 hr capsule Take 1 capsule (30 mg total) by mouth daily. 06/29/21   Breeback, Royetta Car, PA-C  amphetamine-dextroamphetamine (ADDERALL XR) 30 MG 24 hr capsule Take 1 capsule (30 mg total) by mouth daily. 06/05/21   Breeback, Jade L, PA-C  buPROPion (WELLBUTRIN XL) 300 MG 24 hr tablet TAKE 1 TABLET DAILY . 06/05/21   Breeback, Royetta Car, PA-C  cyclobenzaprine (FLEXERIL) 10 MG tablet Take 1 tablet (10 mg total) by mouth at bedtime. 06/05/21   Breeback, Royetta Car, PA-C  dexlansoprazole (DEXILANT) 60 MG capsule Take 1 capsule by mouth daily. 02/27/21   [provider]  docusate sodium (COLACE) 50 MG capsule Take 50 mg by mouth daily as needed for moderate constipation.     [provider]  levothyroxine (SYNTHROID) 75 MCG tablet Take 1 tablet (75 mcg total) by mouth daily before breakfast. 06/05/21   Breeback, Jade L, PA-C  LINZESS 290 MCG CAPS capsule Take 290 mcg by mouth daily. 09/23/16   [provider]  lisdexamfetamine (VYVANSE) 30 MG capsule Take 1 capsule (30 mg total) by mouth daily. 07/25/21   Breeback, Royetta Car, PA-C  Semaglutide-Weight Management (WEGOVY) 0.25 MG/0.5ML SOAJ Inject 0.25 mg into the skin once a week. 07/10/21   Breeback, Royetta Car, PA-C  triamterene-hydrochlorothiazide (MAXZIDE-25) 37.5-25 MG tablet TAKE 1 TABLET DAILY 03/30/21   Breeback, Jade L, PA-C  WEGOVY 0.5 MG/0.5ML SOAJ Inject 0.5 mg into the skin once a week. Use this dose for 1 month (4 shots) and then increase to next higher dose. 07/10/21   Breeback, Jade L, PA-C  WEGOVY 1 MG/0.5ML SOAJ Inject 1 mg into the skin once a week. Use this dose for 1 month (4 shots) and then increase to next higher dose. 07/10/21   Breeback, Royetta Car, PA-C      Allergies    Mobic [meloxicam] and Multivitamins    Review of Systems   Review of Systems  Skin:  Positive for wound.   Physical Exam Updated Vital Signs BP (!) 156/82 (BP Location: Right Arm)    Pulse 80    Temp 98.5 F (36.9 C) (Oral)    Resp 18    Ht 5\' 8"  (1.727  m)    Wt 88.5 kg    SpO2 99%    BMI 29.65 kg/m  Physical Exam Vitals and nursing note reviewed.  Constitutional:      General: She is not in acute distress.    Appearance: She is well-developed.     Comments: GCS 15, ABC intact  HENT:     Head: Normocephalic and atraumatic.  Eyes:     Extraocular Movements: Extraocular movements intact.     Conjunctiva/sclera: Conjunctivae normal.     Pupils: Pupils are equal, round, and reactive to light.     Comments: Right eyelid hematoma with 2 cm linear laceration, hemostatic and well approximated  Neck:     Comments: No midline tenderness to palpation of the cervical spine.  Range of motion intact Cardiovascular:      Rate and Rhythm: Normal rate and regular rhythm.     Heart sounds: No murmur heard. Pulmonary:     Effort: Pulmonary effort is normal. No respiratory distress.     Breath sounds: Normal breath sounds.  Abdominal:     Palpations: Abdomen is soft.     Tenderness: There is no abdominal tenderness.  Musculoskeletal:     Cervical back: Neck supple.     Comments: No midline tenderness to palpation of the thoracic or lumbar spine.  Extremities atraumatic with intact range of motion  Skin:    General: Skin is warm and dry.  Neurological:     Mental Status: She is alert.     Comments: Cranial nerves II through XII grossly intact.  Moving all 4 extremities spontaneously.  Sensation grossly intact all 4 extremities    ED Results / Procedures / Treatments   Labs (all labs ordered are listed, but only abnormal results are displayed) Labs Reviewed - No data to display  EKG None  Radiology No results found.  Procedures .[name] KitchenLaceration Repair  Date/Time: 08/02/2021 8:37 AM Performed by: Regan Lemming, MD Authorized by: Regan Lemming, MD   Consent:    Consent obtained:  Verbal   Consent given by:  Patient   Risks discussed:  Infection, pain, poor cosmetic result and poor wound healing Universal protocol:    Patient identity confirmed:  Verbally with patient Anesthesia:    Anesthesia method:  None Laceration details:    Location:  Face   Face location:  R upper eyelid   Extent:  Partial thickness   Length (cm):  2 Treatment:    Area cleansed with:  Saline   Amount of cleaning:  Standard Skin repair:    Repair method:  Tissue adhesive Approximation:    Approximation:  Close Repair type:    Repair type:  Simple Post-procedure details:    Dressing:  Open (no dressing)   Procedure completion:  Tolerated    Medications Ordered in ED Medications - No data to display   ED Course/ Medical Decision Making/ A&P                           Medical Decision  Making Risk Prescription drug management.   61 year old female with medical history significant for acoustic neuroma, vestibular schwannoma s/p resection with chronic balance issues and gait issues who presents to the emergency department after a mechanical fall.  Patient states that she experienced a mechanical fall when she tripped and struck her right face on a door.  No loss of consciousness.  She sustained a 2 cm laceration to her eyelid that is hemostatic.  Her last tetanus was in 2018 within the last 5 years.  She denies any other injuries or complaints.  She arrived ABC intact, GCS 15.   No other injuries identified on primary or secondary survey.  Patient is cleared by [place] head CT and Nexus criteria.  No need for advanced imaging of the head or cervical spine at this time.  The patient's linear laceration upper eyelid was cleaned and approximated with Dermabond.  Wound care instructions provided. Overall stable for discharge.   Final Clinical Impression(s) / ED Diagnoses Final diagnoses:  Right eyelid laceration, initial encounter  Hematoma of eyelid    Rx / DC Orders ED Discharge Orders     None         Regan Lemming, MD 08/02/21 [phone]

## 2021-08-02 NOTE — ED Notes (Signed)
Facial lac was skin glued by provider, pt states that she is ready to go home, verbalized understanding d/c instructions and follow up, ambulatory from dpt with mom

## 2021-08-14 ENCOUNTER — Ambulatory Visit: Payer: BC Managed Care – PPO | Admitting: Physician Assistant

## 2021-08-14 ENCOUNTER — Encounter: Payer: Self-pay | Admitting: Physician Assistant

## 2021-08-14 ENCOUNTER — Other Ambulatory Visit: Payer: Self-pay

## 2021-08-14 VITALS — BP 137/87 | HR 76 | Ht 66.0 in | Wt 196.0 lb

## 2021-08-14 DIAGNOSIS — W19XXXD Unspecified fall, subsequent encounter: Secondary | ICD-10-CM

## 2021-08-14 DIAGNOSIS — I951 Orthostatic hypotension: Secondary | ICD-10-CM | POA: Diagnosis not present

## 2021-08-14 DIAGNOSIS — S01111D Laceration without foreign body of right eyelid and periocular area, subsequent encounter: Secondary | ICD-10-CM

## 2021-08-14 NOTE — Patient Instructions (Signed)
Orthostatic Hypotension Blood pressure is a measurement of how strongly, or weakly, your circulating blood is pressing against the walls of your arteries. Orthostatic hypotension is a drop in blood pressure that can happen when you change positions, such as when you go from lying down to standing. Arteries are blood vessels that carry blood from your heart throughout your body. When blood pressure is too low, you may not get enough blood to your brain or to the rest of your organs. Orthostatic hypotension can cause light-headedness, sweating, rapid heartbeat, blurred vision, and fainting. These symptoms require further investigation into the cause. What are the causes? Orthostatic hypotension can be caused by many things, including: Sudden changes in posture, such as standing up quickly after you have been sitting or lying down. Loss of blood (anemia) or loss of body fluids (dehydration). Heart problems, neurologic problems, or hormone problems. Pregnancy. Aging. The risk for this condition increases as you get older. Severe infection (sepsis). Certain medicines, such as medicines for high blood pressure or medicines that make the body lose excess fluids (diuretics). What are the signs or symptoms? Symptoms of this condition may include: Weakness, light-headedness, or dizziness. Sweating. Blurred vision. Tiredness (fatigue). Rapid heartbeat. Fainting, in severe cases. How is this diagnosed? This condition is diagnosed based on: Your symptoms and medical history. Your blood pressure measurements. Your health care provider will check your blood pressure when you are: Lying down. Sitting. Standing. A blood pressure reading is recorded as two numbers, such as "120 over 80" (or 120/80). The first ("top") number is called the systolic pressure. It is a measure of the pressure in your arteries as your heart beats. The second ("bottom") number is called the diastolic pressure. It is a measure of  the pressure in your arteries when your heart relaxes between beats. Blood pressure is measured in a unit called mmHg. Healthy blood pressure for most adults is 120/80 mmHg. Orthostatic hypotension is defined as a 20 mmHg drop in systolic pressure or a 10 mmHg drop in diastolic pressure within 3 minutes of standing. Other information or tests that may be used to diagnose orthostatic hypotension include: Your other vital signs, such as your heart rate and temperature. Blood tests. An electrocardiogram (ECG) or echocardiogram. A Holter monitor. This is a device you wear that records your heart rhythm continuously, usually for 24-48 hours. Tilt table test. For this test, you will be safely secured to a table that moves you from a lying position to an upright position. Your heart rhythm and blood pressure will be monitored during the test. How is this treated? This condition may be treated by: Changing your diet. This may involve eating more salt (sodium) or drinking more water. Changing the dosage of certain medicines you are taking that might be lowering your blood pressure. Correcting the underlying reason for the orthostatic hypotension. Wearing compression stockings. Taking medicines to raise your blood pressure. Avoiding actions that trigger symptoms. Follow these instructions at home: Medicines Take over-the-counter and prescription medicines only as told by your health care provider. Follow instructions from your health care provider about changing the dosage of your current medicines, if this applies. Do not stop or adjust any of your medicines on your own. Eating and drinking  Drink enough fluid to keep your urine pale yellow. Eat extra salt only as directed. Do not add extra salt to your diet unless advised by your health care provider. Eat frequent, small meals. Avoid standing up suddenly after eating. General instructions    Get up slowly from lying down or sitting positions. This  gives your blood pressure a chance to adjust. Avoid hot showers and excessive heat as directed by your health care provider. Engage in regular physical activity as directed by your health care provider. If you have compression stockings, wear them as told. Keep all follow-up visits. This is important. Contact a health care provider if: You have a fever for more than 2-3 days. You feel more thirsty than usual. You feel dizzy or weak. Get help right away if: You have chest pain. You have a fast or irregular heartbeat. You become sweaty or feel light-headed. You feel short of breath. You faint. You have any symptoms of a stroke. "BE FAST" is an easy way to remember the main warning signs of a stroke: B - Balance. Signs are dizziness, sudden trouble walking, or loss of balance. E - Eyes. Signs are trouble seeing or a sudden change in vision. F - Face. Signs are sudden weakness or numbness of the face, or the face or eyelid drooping on one side. A - Arms. Signs are weakness or numbness in an arm. This happens suddenly and usually on one side of the body. S - Speech. Signs are sudden trouble speaking, slurred speech, or trouble understanding what people say. T - Time. Time to call emergency services. Write down what time symptoms started. You have other signs of a stroke, such as: A sudden, severe headache with no known cause. Nausea or vomiting. Seizure. These symptoms may represent a serious problem that is an emergency. Do not wait to see if the symptoms will go away. Get medical help right away. Call your local emergency services (911 in the [place] Do not drive yourself to the hospital. Summary Orthostatic hypotension is a sudden drop in blood pressure. It can cause light-headedness, sweating, rapid heartbeat, blurred vision, and fainting. Orthostatic hypotension can be diagnosed by having your blood pressure taken while lying down, sitting, and then standing. Treatment may involve  changing your diet, wearing compression stockings, sitting up slowly, adjusting your medicines, or correcting the underlying reason for the orthostatic hypotension. Get help right away if you have chest pain, a fast or irregular heartbeat, or symptoms of a stroke. This information is not intended to replace advice given to you by your health care provider. Make sure you discuss any questions you have with your health care provider. Document Revised: 08/18/2020 Document Reviewed: 08/18/2020 Elsevier Patient Education  2022 [employer]  

## 2021-08-14 NOTE — Progress Notes (Signed)
Subjective:    Patient ID: Cynthia [name], female    DOB: 23-Nov-1960, 61 y.o.   MRN: [id number]  HPI Pt is a 61 yo female with hypothyroidism, ADD, left acoustic neuroma who presents to the clinic to follow up after ED visit on 2/15 for dizziness and fall that resulted in laceration to right upper eyelid. She had area glued. She has been having some dizziness episodes when she stands. She has some dizziness associated with acoustic neuromas. No CP, palpitations, headaches. Her last fall was during covid but similar symptoms.   .. 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   Hashimoto's thyroiditis 06/14/2015   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[place]) 12/06/2015   Deafness in left ear 12/06/2015   Chronic left-sided low back pain 09/19/2016   Fatty liver disease, nonalcoholic 83/41/9622   Left renal mass 03/26/2017   Obesity (BMI 30.0-34.9) 03/26/2017   Compression of right radial nerve 06/26/2017   Motion sickness 12/26/2017   Family history of osteoporosis 03/05/2019   Post-menopausal 03/05/2019   Atrophic vaginitis 06/03/2019   Basal cell carcinoma of left upper arm 10/21/2019   Hearing loss of right ear 11/24/2019   Tremor of right hand 02/26/2020   Chronic gastritis without bleeding 05/27/2020   Hiatal hernia 05/27/2020   Nausea 05/27/2020   History of endometrial ablation 10/28/2020   History of partial thyroidectomy 10/28/2020   Right-sided chest wall pain 11/28/2020   Irritation of left radial nerve 03/16/2021   Orthostatic  hypotension 08/14/2021   Resolved Ambulatory Problems    Diagnosis Date Noted   IFG (impaired fasting glucose) 09/13/2013   Backache 09/13/2013   Wheezing 09/13/2013   Swelling of limb 09/13/2013   Obesity, unspecified 09/13/2013   Low back pain 05/11/2014   Thyromegaly 02/13/2015   Subconjunctival hemorrhage of right eye 06/28/2015   Acoustic neuroma ([place]) 12/10/2014   Left ear hearing loss 10/14/2015   Costochondritis 03/21/2016   Irritable 10/17/2016   Thyroid adenoma 05/03/2015   Exposure to influenza 05/07/2018   Accidental fall 10/27/2018   Past Medical History:  Diagnosis Date   Asthma    Awareness under anesthesia    Balance problem    Chronic back pain    Complication of anesthesia    Constipation, chronic    Difficult airway    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     Objective:   Physical Exam Vitals reviewed.  Constitutional:      Appearance: Normal appearance. She is obese.  HENT:     Head: Normocephalic.  Cardiovascular:     Rate and Rhythm: Normal rate.  Pulmonary:     Effort: Pulmonary effort is normal.     Breath sounds: Normal breath sounds.  Musculoskeletal:     Right lower leg: No edema.     Left lower leg: No edema.  Skin:    Comments: Right eyelid 2cm laceration with glue over the top with no  signs of drainage, redness, or swelling. Slightly tender to touch. Resolving hematoma under right eye  Neurological:     General: No focal deficit present.     Mental Status: She is alert and oriented to person, place, and time.     Cranial Nerves: No cranial nerve deficit.     Sensory: No sensory deficit.     Motor: No weakness.     Coordination: Coordination normal.     Gait: Gait normal.     Deep Tendon Reflexes: Reflexes normal.     Comments: Dixhallpike negative bilaterally. Minima dizziness when sitting up.    Psychiatric:        Mood and Affect: Mood normal.      ..Orthostatic VS for the past 24 hrs (Last 3 readings):  BP- Lying Pulse- Lying BP- Sitting Pulse- Sitting BP- Standing at 0 minutes Pulse- Standing at 0 minutes  08/14/21 1020 131/70 70 122/72 67 103/67 85       Assessment & Plan:  [name] KitchenMarland KitchenFeather was seen today for laceration.  Diagnoses and all orders for this visit:  Orthostatic hypotension  Fall, subsequent encounter  Right eyelid laceration, subsequent encounter   Negative dixhalpike Orthostatic showed [id number] change in systolic number HO given Discussed prevention and wearing compression stockings No sign of infection over laceration Continue to ice Follow up as needed or if symptoms worsening or changing

## 2021-11-07 DIAGNOSIS — D2361 Other benign neoplasm of skin of right upper limb, including shoulder: Secondary | ICD-10-CM | POA: Diagnosis not present

## 2021-11-07 DIAGNOSIS — D2372 Other benign neoplasm of skin of left lower limb, including hip: Secondary | ICD-10-CM | POA: Diagnosis not present

## 2021-11-07 DIAGNOSIS — L503 Dermatographic urticaria: Secondary | ICD-10-CM | POA: Diagnosis not present

## 2021-11-07 DIAGNOSIS — L814 Other melanin hyperpigmentation: Secondary | ICD-10-CM | POA: Diagnosis not present

## 2021-11-28 ENCOUNTER — Ambulatory Visit: Payer: BC Managed Care – PPO | Admitting: Physician Assistant

## 2021-11-28 ENCOUNTER — Encounter: Payer: Self-pay | Admitting: Physician Assistant

## 2021-11-28 VITALS — BP 118/68 | HR 65 | Ht 66.0 in | Wt 198.0 lb

## 2021-11-28 DIAGNOSIS — M545 Low back pain, unspecified: Secondary | ICD-10-CM

## 2021-11-28 DIAGNOSIS — I10 Essential (primary) hypertension: Secondary | ICD-10-CM

## 2021-11-28 DIAGNOSIS — Z13220 Encounter for screening for lipoid disorders: Secondary | ICD-10-CM | POA: Diagnosis not present

## 2021-11-28 DIAGNOSIS — F900 Attention-deficit hyperactivity disorder, predominantly inattentive type: Secondary | ICD-10-CM

## 2021-11-28 DIAGNOSIS — Z79899 Other long term (current) drug therapy: Secondary | ICD-10-CM | POA: Diagnosis not present

## 2021-11-28 DIAGNOSIS — Z8669 Personal history of other diseases of the nervous system and sense organs: Secondary | ICD-10-CM

## 2021-11-28 DIAGNOSIS — G8929 Other chronic pain: Secondary | ICD-10-CM

## 2021-11-28 DIAGNOSIS — E063 Autoimmune thyroiditis: Secondary | ICD-10-CM

## 2021-11-28 DIAGNOSIS — Z1231 Encounter for screening mammogram for malignant neoplasm of breast: Secondary | ICD-10-CM

## 2021-11-28 DIAGNOSIS — R5383 Other fatigue: Secondary | ICD-10-CM

## 2021-11-28 DIAGNOSIS — E039 Hypothyroidism, unspecified: Secondary | ICD-10-CM

## 2021-11-28 MED ORDER — AMPHETAMINE-DEXTROAMPHET ER 30 MG PO CP24: 30.0000 mg | ORAL_CAPSULE | Freq: Every day | ORAL | 0 refills | Status: DC

## 2021-11-28 MED ORDER — TRIAMTERENE-HCTZ 37.5-25 MG PO TABS: 1.0000 | ORAL_TABLET | Freq: Every day | ORAL | 1 refills | Status: DC

## 2021-11-28 MED ORDER — LEVOTHYROXINE SODIUM 75 MCG PO TABS: 75.0000 ug | ORAL_TABLET | Freq: Every day | ORAL | 1 refills | Status: DC

## 2021-11-28 MED ORDER — CYCLOBENZAPRINE HCL 10 MG PO TABS: 10.0000 mg | ORAL_TABLET | Freq: Every day | ORAL | 1 refills | Status: DC

## 2021-11-28 NOTE — Progress Notes (Signed)
Established Patient Office Visit  Subjective   Patient ID: Cynthia [name], female    DOB: 1960-07-23  Age: 61 y.o. MRN: [id number]  Chief Complaint  Patient presents with   Follow-up    HPI Pt is a 61 yo obese female with HTN, hypothyroidism, ADHD, GERD, left acoustic neuroma who presents to the clinic for medication refills.   She is doing well on adderall. Work is going ok. She feels like she is going to be fired soon because of all her friends being fired. Focus is doing well. No major mood concerns.   She denies any CP, palpitations, headaches or vision changes.   She is more fatigued over the last month. Hx of sleep apnea but resolved with weight loss. She feels like she is sleeping ok. She does snore.   .. 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[place]) 12/06/2015   Deafness in left ear 12/06/2015   Chronic left-sided low back pain 09/19/2016   Fatty liver disease, nonalcoholic 92/33/0076   Left renal mass 03/26/2017   Obesity (BMI 30.0-34.9) 03/26/2017   Compression of right radial nerve 06/26/2017   Motion sickness 12/26/2017   Family history of osteoporosis 03/05/2019   Post-menopausal 03/05/2019   Atrophic vaginitis 06/03/2019   Basal cell carcinoma of left upper arm 10/21/2019   Hearing loss of right ear 11/24/2019   Tremor of right hand 02/26/2020   Chronic gastritis without bleeding 05/27/2020   Hiatal hernia 05/27/2020   Nausea 05/27/2020    History of endometrial ablation 10/28/2020   History of partial thyroidectomy 10/28/2020   Right-sided chest wall pain 11/28/2020   Irritation of left radial nerve 03/16/2021   Orthostatic hypotension 08/14/2021   No energy 11/29/2021   History of sleep apnea 11/29/2021   Resolved Ambulatory Problems    Diagnosis Date Noted   IFG (impaired fasting glucose) 09/13/2013   Backache 09/13/2013   Wheezing 09/13/2013   Swelling of limb 09/13/2013   Obesity, unspecified 09/13/2013   Low back pain 05/11/2014   Thyromegaly 02/13/2015   Subconjunctival hemorrhage of right eye 06/28/2015   Acoustic neuroma ([place]) 12/10/2014   Left ear hearing loss 10/14/2015   Costochondritis 03/21/2016   Irritable 10/17/2016   Thyroid adenoma 05/03/2015   Exposure to influenza 05/07/2018   Accidental fall 10/27/2018   Past Medical History:  Diagnosis Date   Asthma    Awareness under anesthesia    Balance problem    Chronic back pain    Complication of anesthesia    Constipation, chronic    Difficult airway    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   GERD (gastroesophageal reflux disease)    History of diabetes mellitus    Obesity    Tendonitis, Achilles, left     ROS See HPI.    Objective:     BP 118/68   Pulse 65   Ht '5\' 6"'$  (1.676 m)   Wt 198 lb (89.8 kg)   SpO2 100%   BMI 31.96 kg/m  BP Readings from Last 3 Encounters:  11/28/21 118/68  08/14/21 137/87  08/02/21 127/87   Wt Readings from Last 3 Encounters:  11/28/21 198 lb (89.8 kg)  08/14/21 196 lb (88.9 kg)  08/02/21 195 lb (88.5 kg)   ..    11/28/2021    9:27 AM 05/30/2021    2:16 PM 11/28/2020    9:51 AM 05/27/2020    8:18 AM 02/26/2020    8:45 AM  Depression screen PHQ 2/9  Decreased Interest 0 0 0 0 0  Down, Depressed, Hopeless 0 0 0 1 0  PHQ - 2 Score 0 0 0 1 0  Altered sleeping   '1 1 1  '$ Tired, decreased energy   0 0 2  Change in appetite   0 1 1  Feeling bad or failure about yourself    0 0 0  Trouble concentrating   2 0 2   Moving slowly or fidgety/restless   0 0 0  Suicidal thoughts   0 0 0  PHQ-9 Score   '3 3 6  '$ Difficult doing work/chores   Not difficult at all  Not difficult at all   .[name] Kitchen    11/28/2020    9:51 AM 05/27/2020    8:19 AM 02/26/2020    8:45 AM 08/24/2019    9:05 AM  GAD 7 : Generalized Anxiety Score  Nervous, Anxious, on Edge 1 0 0 0  Control/stop worrying 0 0 0 0  Worry too much - different things 0 1 0 0  Trouble relaxing '1 1 1 1  '$ Restless '1 1 2 1  '$ Easily annoyed or irritable '2 2 1 '$ 0  Afraid - awful might happen 0 0 0 0  Total GAD 7 Score '5 5 4 2  '$ Anxiety Difficulty Not difficult at all Not difficult at all Not difficult at all Not difficult at all       Physical Exam Vitals reviewed.  Constitutional:      Appearance: Normal appearance. She is obese.  HENT:     Head: Normocephalic.  Neck:     Vascular: No carotid bruit.  Cardiovascular:     Rate and Rhythm: Normal rate and regular rhythm.     Pulses: Normal pulses.     Heart sounds: Normal heart sounds.  Pulmonary:     Effort: Pulmonary effort is normal.     Breath sounds: Normal breath sounds.  Musculoskeletal:     Right lower leg: No edema.     Left lower leg: No edema.  Lymphadenopathy:     Cervical: No cervical adenopathy.  Neurological:     General: No focal deficit present.     Mental Status: She is alert and oriented to person, place, and time.  Psychiatric:        Mood and Affect: Mood normal.         Assessment & Plan:  [name] KitchenMarland KitchenAdore was seen today for follow-up.  Diagnoses and all orders for this visit:  Attention deficit hyperactivity disorder (ADHD), predominantly inattentive type -     amphetamine-dextroamphetamine (ADDERALL XR) 30 MG 24 hr capsule; Take 1 capsule (30 mg total) by mouth daily. -     amphetamine-dextroamphetamine (ADDERALL XR) 30 MG 24 hr capsule; Take 1 capsule (30 mg total) by mouth daily. -     amphetamine-dextroamphetamine (ADDERALL XR) 30 MG 24 hr capsule; Take 1 capsule (30 mg  total) by mouth daily.  Medication management -     TSH -     Lipid Panel w/reflex Direct LDL -  COMPLETE METABOLIC PANEL WITH GFR -     CBC with Differential/Platelet  Hypothyroidism, unspecified type -     TSH  Screening for lipid disorders -     Lipid Panel w/reflex Direct LDL  Essential (primary) hypertension -     COMPLETE METABOLIC PANEL WITH GFR -     triamterene-hydrochlorothiazide (MAXZIDE-25) 37.5-25 MG tablet; Take 1 tablet by mouth daily.  Chronic left-sided low back pain without sciatica -     cyclobenzaprine (FLEXERIL) 10 MG tablet; Take 1 tablet (10 mg total) by mouth at bedtime.  Hashimoto's thyroiditis -     levothyroxine (SYNTHROID) 75 MCG tablet; Take 1 tablet (75 mcg total) by mouth daily before breakfast. -     T4, free -     T3, free  Encounter for screening mammogram for malignant neoplasm of breast -     MM 3D SCREEN BREAST BILATERAL  No energy -     B12 and Folate Panel -     VITAMIN D 25 Hydroxy (Vit-D Deficiency, Fractures)  History of sleep apnea   Refilled adderall for 3 months Follow up in 6 months  Unclear etiology of fatigue Will get labs to recheck thyroid, vitamin D and b12 She is stressed some which could cause fatigue Discussed ways to improve mood Hx of sleep apnea consider sleep study to recheck     Return in about 6 months (around 05/30/2022).    Iran Planas, PA-C

## 2021-11-28 NOTE — Patient Instructions (Signed)
Fatigue If you have fatigue, you feel tired all the time and have a lack of energy or a lack of motivation. Fatigue may make it difficult to start or complete tasks because of exhaustion. Occasional or mild fatigue is often a normal response to activity or life. However, long-term (chronic) or extreme fatigue may be a symptom of a medical condition such as: Depression. Not having enough red blood cells or hemoglobin in the blood (anemia). A problem with a small gland located in the lower front part of the neck (thyroid disorder). Rheumatologic conditions. These are problems related to the body's defense system (immune system). Infections, especially certain viral infections. Fatigue can also lead to negative health outcomes over time. Follow these instructions at home: Medicines Take over-the-counter and prescription medicines only as told by your health care provider. Take a multivitamin if told by your health care provider. Do not use herbal or dietary supplements unless they are approved by your health care provider. Eating and drinking  Avoid heavy meals in the evening. Eat a well-balanced diet, which includes lean proteins, whole grains, plenty of fruits and vegetables, and low-fat dairy products. Avoid eating or drinking too many products with caffeine in them. Avoid alcohol. Drink enough fluid to keep your urine pale yellow. Activity  Exercise regularly, as told by your health care provider. Use or practice techniques to help you relax, such as yoga, tai chi, meditation, or massage therapy. Lifestyle Change situations that cause you stress. Try to keep your work and personal schedules in balance. Do not use recreational or illegal drugs. General instructions Monitor your fatigue for any changes. Go to bed and get up at the same time every day. Avoid fatigue by pacing yourself during the day and getting enough sleep at night. Maintain a healthy weight. Contact a health care  provider if: Your fatigue does not get better. You have a fever. You suddenly lose or gain weight. You have headaches. You have trouble falling asleep or sleeping through the night. You feel angry, guilty, anxious, or sad. You have swelling in your legs or another part of your body. Get help right away if: You feel confused, feel like you might faint, or faint. Your vision is blurry or you have a severe headache. You have severe pain in your abdomen, your back, or the area between your waist and hips (pelvis). You have chest pain, shortness of breath, or an irregular or fast heartbeat. You are unable to urinate, or you urinate less than normal. You have abnormal bleeding from the rectum, nose, lungs, nipples, or, if you are female, the vagina. You vomit blood. You have thoughts about hurting yourself or others. These symptoms may be an emergency. Get help right away. Call 911. Do not wait to see if the symptoms will go away. Do not drive yourself to the hospital. Get help right away if you feel like you may hurt yourself or others, or have thoughts about taking your own life. Go to your nearest emergency room or: Call 911. Call the [facility] at [phone] or 988. This is open 24 hours a day. Text the Crisis Text Line at 979-823-0385. Summary If you have fatigue, you feel tired all the time and have a lack of energy or a lack of motivation. Fatigue may make it difficult to start or complete tasks because of exhaustion. Long-term (chronic) or extreme fatigue may be a symptom of a medical condition. Exercise regularly, as told by your health care provider.  Change situations that cause you stress. Try to keep your work and personal schedules in balance. This information is not intended to replace advice given to you by your health care provider. Make sure you discuss any questions you have with your health care provider. Document Revised: 03/27/2021 Document  Reviewed: 03/27/2021 Elsevier Patient Education  2023 Elsevier Inc.  

## 2021-11-29 ENCOUNTER — Encounter: Payer: Self-pay | Admitting: Physician Assistant

## 2021-11-29 ENCOUNTER — Other Ambulatory Visit: Payer: Self-pay | Admitting: Neurology

## 2021-11-29 DIAGNOSIS — Z8669 Personal history of other diseases of the nervous system and sense organs: Secondary | ICD-10-CM | POA: Insufficient documentation

## 2021-11-29 DIAGNOSIS — E039 Hypothyroidism, unspecified: Secondary | ICD-10-CM

## 2021-11-29 DIAGNOSIS — R5383 Other fatigue: Secondary | ICD-10-CM | POA: Insufficient documentation

## 2021-11-29 DIAGNOSIS — E559 Vitamin D deficiency, unspecified: Secondary | ICD-10-CM

## 2021-11-29 DIAGNOSIS — E538 Deficiency of other specified B group vitamins: Secondary | ICD-10-CM

## 2021-11-29 LAB — LIPID PANEL W/REFLEX DIRECT LDL
Cholesterol: 188 mg/dL (ref <200)
HDL: 76 mg/dL (ref >=50)
LDL Cholesterol (Calc): 94 mg/dL (calc)
Non-HDL Cholesterol (Calc): 112 mg/dL (calc) (ref <130)
Total CHOL/HDL Ratio: 2.5 (calc) (ref <5.0)
Triglycerides: 86 mg/dL (ref <150)

## 2021-11-29 LAB — CBC WITH DIFFERENTIAL/PLATELET
Absolute Monocytes: 440 cells/uL (ref 200–950)
Basophils Absolute: 0 cells/uL (ref 0–200)
Basophils Relative: 0 %
Eosinophils Absolute: 32 cells/uL (ref 15–500)
Eosinophils Relative: 0.6 %
HCT: 39.3 % (ref 35.0–45.0)
Hemoglobin: 13.5 g/dL (ref 11.7–15.5)
Lymphs Abs: 1378 cells/uL (ref 850–3900)
MCH: 29.6 pg (ref 27.0–33.0)
MCHC: 34.4 g/dL (ref 32.0–36.0)
MCV: 86.2 fL (ref 80.0–100.0)
MPV: 9 fL (ref 7.5–12.5)
Monocytes Relative: 8.3 %
Neutro Abs: 3450 cells/uL (ref 1500–7800)
Neutrophils Relative %: 65.1 %
Platelets: 162 10*3/uL (ref 140–400)
RBC: 4.56 10*6/uL (ref 3.80–5.10)
RDW: 12.2 % (ref 11.0–15.0)
Total Lymphocyte: 26 %
WBC: 5.3 10*3/uL (ref 3.8–10.8)

## 2021-11-29 LAB — COMPLETE METABOLIC PANEL WITH GFR
AG Ratio: 1.9 (calc) (ref 1.0–2.5)
ALT: 13 U/L (ref 6–29)
AST: 18 U/L (ref 10–35)
Albumin: 4.5 g/dL (ref 3.6–5.1)
Alkaline phosphatase (APISO): 61 U/L (ref 37–153)
BUN: 18 mg/dL (ref 7–25)
CO2: 28 mmol/L (ref 20–32)
Calcium: 9 mg/dL (ref 8.6–10.4)
Chloride: 102 mmol/L (ref 98–110)
Creat: 0.99 mg/dL (ref 0.50–1.03)
Globulin: 2.4 g/dL (calc) (ref 1.9–3.7)
Glucose, Bld: 90 mg/dL (ref 65–99)
Potassium: 4 mmol/L (ref 3.5–5.3)
Sodium: 138 mmol/L (ref 135–146)
Total Bilirubin: 0.8 mg/dL (ref 0.2–1.2)
Total Protein: 6.9 g/dL (ref 6.1–8.1)
eGFR: 66 mL/min/{1.73_m2} (ref >=60)

## 2021-11-29 LAB — B12 AND FOLATE PANEL
Folate: 14.9 ng/mL
Vitamin B-12: 330 pg/mL (ref 200–1100)

## 2021-11-29 LAB — T3, FREE: T3, Free: 2.5 pg/mL (ref 2.3–4.2)

## 2021-11-29 LAB — VITAMIN D 25 HYDROXY (VIT D DEFICIENCY, FRACTURES): Vit D, 25-Hydroxy: 22 ng/mL — ABNORMAL LOW (ref 30–100)

## 2021-11-29 LAB — TSH: TSH: 1.93 mIU/L (ref 0.40–4.50)

## 2021-11-29 LAB — T4, FREE: Free T4: 1.3 ng/dL (ref 0.8–1.8)

## 2021-11-29 NOTE — Progress Notes (Signed)
Vitamin D low. Double what ever you are taking.  Thyroid looks great and stable.  Free T3 normal but on the lower side we could add some cytomel or T3 and see if that helps your fatigue. Are you ok with this?  Cholesterol looks fantastic.  Kidney, liver, glucose, electrolytes look great.  Normal WBC and hemoglobin.  B12 low normal. Take 1036mg daily.   Recheck TSH/free t3, vitamin D, B12 in 3 months.

## 2021-12-12 ENCOUNTER — Other Ambulatory Visit: Payer: Self-pay | Admitting: Neurosurgery

## 2021-12-12 DIAGNOSIS — M5440 Lumbago with sciatica, unspecified side: Secondary | ICD-10-CM

## 2021-12-18 ENCOUNTER — Ambulatory Visit
Admission: RE | Admit: 2021-12-18 | Discharge: 2021-12-18 | Disposition: A | Discharge status: Home / Self Care | Payer: BC Managed Care – PPO | Source: Ambulatory Visit | Attending: Neurosurgery | Admitting: Neurosurgery

## 2021-12-18 DIAGNOSIS — M5440 Lumbago with sciatica, unspecified side: Secondary | ICD-10-CM

## 2021-12-18 MED ORDER — IOPAMIDOL (ISOVUE-M 200) INJECTION 41%
1.0000 mL | Freq: Once | INTRAMUSCULAR | Status: AC
  Administered 2021-12-18: 1 mL via EPIDURAL

## 2021-12-18 MED ORDER — METHYLPREDNISOLONE ACETATE 40 MG/ML INJ SUSP (RADIOLOG
80.0000 mg | Freq: Once | INTRAMUSCULAR | Status: AC
  Administered 2021-12-18: 80 mg via EPIDURAL

## 2021-12-18 NOTE — Discharge Instructions (Signed)

## 2021-12-20 ENCOUNTER — Ambulatory Visit: Payer: BC Managed Care – PPO | Admitting: Sports Medicine

## 2021-12-21 ENCOUNTER — Ambulatory Visit (INDEPENDENT_AMBULATORY_CARE_PROVIDER_SITE_OTHER): Payer: BC Managed Care – PPO | Admitting: Obstetrics & Gynecology

## 2021-12-21 ENCOUNTER — Encounter (HOSPITAL_BASED_OUTPATIENT_CLINIC_OR_DEPARTMENT_OTHER): Payer: Self-pay | Admitting: Obstetrics & Gynecology

## 2021-12-21 VITALS — BP 126/84 | HR 69 | Ht 66.25 in | Wt 199.6 lb

## 2021-12-21 DIAGNOSIS — Z9889 Other specified postprocedural states: Secondary | ICD-10-CM | POA: Diagnosis not present

## 2021-12-21 DIAGNOSIS — Z01419 Encounter for gynecological examination (general) (routine) without abnormal findings: Secondary | ICD-10-CM

## 2021-12-21 DIAGNOSIS — B009 Herpesviral infection, unspecified: Secondary | ICD-10-CM

## 2021-12-21 DIAGNOSIS — N952 Postmenopausal atrophic vaginitis: Secondary | ICD-10-CM

## 2021-12-21 DIAGNOSIS — Z780 Asymptomatic menopausal state: Secondary | ICD-10-CM | POA: Diagnosis not present

## 2021-12-21 MED ORDER — VALACYCLOVIR HCL 500 MG PO TABS: ORAL_TABLET | ORAL | 0 refills | Status: DC

## 2021-12-21 NOTE — Progress Notes (Signed)
61 y.o. G0P0000 Married White or Caucasian female here for annual exam.  Doing well.  Did try Wegovy this past year.  Had severe constipation with it.    Denies vaginal bleeding.    Patient's last menstrual period was 06/18/2006 (approximate).          Sexually active: Yes.    The current method of family planning is post menopausal status.    Smoker:  no  Health Maintenance: Pap:  10/28/20 neg History of abnormal Pap:  no MMG:  07/08/19 Colonoscopy:  01/20/21, follow up 10 years BMD:   07/08/2019, normal Screening Labs: done 11/2021   reports that she has never smoked. She has never used smokeless tobacco. She reports current alcohol use of about 1.0 - 2.0 standard drink of alcohol per week. She reports that she does not use drugs.  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   Acustic Neuroma - Brain tumor    GERD (gastroesophageal reflux disease)    History of diabetes mellitus    was prediabetes, off metformin   Iron deficiency anemia, unspecified 02/18/2013   Obesity    296 lbs (highest weight in 2012)    Tendonitis, Achilles, left     Past Surgical History:  Procedure Laterality Date   ABLATION     and bladder sling   ACHILLES TENDON REPAIR     x2   ANAL FISSURE REPAIR  2012   BACK SURGERY     CHOLECYSTECTOMY     COLONOSCOPY WITH PROPOFOL N/A 01/20/2021   Procedure: COLONOSCOPY WITH PROPOFOL;  Surgeon: Carol Ada, MD;  Location: [facility];  Service: Endoscopy;  Laterality: N/A;   CRANIOTOMY  09/13/2015   with excision of acoustic neuroma   ESOPHAGOGASTRODUODENOSCOPY (EGD) WITH PROPOFOL N/A 07/22/2020    Procedure: ESOPHAGOGASTRODUODENOSCOPY (EGD) WITH PROPOFOL;  Surgeon: Carol Ada, MD;  Location: [facility];  Service: Endoscopy;  Laterality: N/A;   IMPLANTATION BONE ANCHORED HEARING AID     MOUTH SURGERY  2015   3 implants   OVARIAN CYST REMOVAL     x4   THYROID LOBECTOMY Right 04/28/2015   Partial    TOE FUSION Left     Current Outpatient Medications  Medication Sig Dispense Refill   albuterol (VENTOLIN HFA) 108 (90 Base) MCG/ACT inhaler Inhale 2 puffs into the lungs every 6 (six) hours as needed for wheezing. 2 each 11   amphetamine-dextroamphetamine (ADDERALL XR) 30 MG 24 hr capsule Take 1 capsule (30 mg total) by mouth daily. 30 capsule 0   buPROPion (WELLBUTRIN XL) 300 MG 24 hr tablet TAKE 1 TABLET DAILY . 90 tablet 1   cyclobenzaprine (FLEXERIL) 10 MG tablet Take 1 tablet (10 mg total) by mouth at bedtime. 90 tablet 1   dexlansoprazole (DEXILANT) 60 MG capsule Take 1 capsule by mouth daily.     docusate sodium (COLACE) 50 MG capsule Take 50 mg by mouth daily as needed for moderate constipation.     levothyroxine (SYNTHROID) 75 MCG tablet Take 1 tablet (75 mcg total) by mouth daily  before breakfast. 90 tablet 1   LINZESS 290 MCG CAPS capsule Take 290 mcg by mouth daily.     triamterene-hydrochlorothiazide (MAXZIDE-25) 37.5-25 MG tablet Take 1 tablet by mouth daily. 90 tablet 1   [START ON 12/27/2021] amphetamine-dextroamphetamine (ADDERALL XR) 30 MG 24 hr capsule Take 1 capsule (30 mg total) by mouth daily. (Patient not taking: Reported on 12/21/2021) 30 capsule 0   [START ON 01/26/2022] amphetamine-dextroamphetamine (ADDERALL XR) 30 MG 24 hr capsule Take 1 capsule (30 mg total) by mouth daily. (Patient not taking: Reported on 12/21/2021) 30 capsule 0   No current facility-administered medications for this visit.    Family History  Problem Relation Age of Onset   Thyroid disease Mother    Heart Problems Mother        vavle issue   Dystonia Mother    Hypercholesterolemia Mother     Heart attack Father    Cancer Father        esophageal   Heart disease Father    Lung cancer Paternal Grandfather    Heart disease Paternal Grandfather    Heart disease Maternal Grandfather    Heart disease Paternal Grandmother    Heart disease Other        paternal and maternal side   Cancer Brother        esophageal/stomach cancer   ROS: Genitourinary:negative  Exam:   BP 126/84   Pulse 69   Ht 5' 6.25" (1.683 m)   Wt 199 lb 9.6 oz (90.5 kg)   LMP 06/18/2006 (Approximate)   BMI 31.97 kg/m   Height: 5' 6.25" (168.3 cm)  General appearance: alert, cooperative and appears stated age Head: Normocephalic, without obvious abnormality, atraumatic Neck: no adenopathy, supple, symmetrical, trachea midline and thyroid normal to inspection and palpation Lungs: clear to auscultation bilaterally Breasts: normal appearance, no masses or tenderness Heart: regular rate and rhythm Abdomen: soft, non-tender; bowel sounds normal; no masses,  no organomegaly Extremities: extremities normal, atraumatic, no cyanosis or edema Skin: Skin color, texture, turgor normal. No rashes or lesions Lymph nodes: Cervical, supraclavicular, and axillary nodes normal. No abnormal inguinal nodes palpated Neurologic: Grossly normal   Pelvic: External genitalia:  no lesions              Urethra:  normal appearing urethra with no masses, tenderness or lesions              Bartholins and Skenes: normal                 Vagina: normal appearing vagina with normal color and no discharge, no lesions              Cervix: no lesions              Pap taken: No. Bimanual Exam:  Uterus:  normal size, contour, position, consistency, mobility, non-tender              Adnexa: normal adnexa and no mass, fullness, tenderness               Rectovaginal: Confirms               Anus:  no skin lesions, pt declined rectal exam  Chaperone, Octaviano Batty, CMA, was present for exam.  Assessment/Plan: 1. Well woman exam  with routine gynecological exam - pap and HR HPV neg with neg HR HPV 2022 - MMG 06/2019 - colonoscopy 2022, follow up 10 years - BMD 06/2019 - lab work done in June -  vaccines reviewed/updated  2. Postmenopausal - no HRT  3. History of endometrial ablation  4. HSV infection - printed rx for valtrex given to pt  5. Atrophic vaginitis

## 2022-01-19 ENCOUNTER — Telehealth: Payer: Self-pay | Admitting: Neurology

## 2022-01-19 MED ORDER — FLUCONAZOLE 150 MG PO TABS: 150.0000 mg | ORAL_TABLET | Freq: Once | ORAL | 0 refills | Status: AC

## 2022-01-19 NOTE — Telephone Encounter (Signed)
Patient left vm stating she is out of town, has been on Amoxicillin for 1 week for dental infection and now has a yeast infection. She is asking if we can give something for yeast. Please advise if okay to send Diflucan.

## 2022-01-19 NOTE — Telephone Encounter (Signed)
RX sent, patient made aware.

## 2022-01-24 ENCOUNTER — Other Ambulatory Visit (HOSPITAL_BASED_OUTPATIENT_CLINIC_OR_DEPARTMENT_OTHER): Payer: Self-pay | Admitting: Obstetrics & Gynecology

## 2022-01-24 NOTE — Telephone Encounter (Signed)
LMOVM for pt to call back regarding refill request

## 2022-02-05 ENCOUNTER — Ambulatory Visit (INDEPENDENT_AMBULATORY_CARE_PROVIDER_SITE_OTHER): Payer: BC Managed Care – PPO | Admitting: Family Medicine

## 2022-02-05 ENCOUNTER — Encounter: Payer: Self-pay | Admitting: Family Medicine

## 2022-02-05 VITALS — BP 118/82 | Ht 68.0 in | Wt 199.0 lb

## 2022-02-05 DIAGNOSIS — S39012A Strain of muscle, fascia and tendon of lower back, initial encounter: Secondary | ICD-10-CM | POA: Diagnosis not present

## 2022-02-05 MED ORDER — METHYLPREDNISOLONE ACETATE 40 MG/ML IJ SUSP
40.0000 mg | Freq: Once | INTRAMUSCULAR | Status: AC
  Administered 2022-02-05: 40 mg via INTRAMUSCULAR

## 2022-02-05 MED ORDER — KETOROLAC TROMETHAMINE 30 MG/ML IJ SOLN
30.0000 mg | Freq: Once | INTRAMUSCULAR | Status: AC
  Administered 2022-02-05: 30 mg via INTRAMUSCULAR

## 2022-02-05 NOTE — Assessment & Plan Note (Signed)
Acutely occurring.  Symptoms seem more associated with spasm at this point.  Does have underlying structural change appreciated on previous MRI but no radicular pain today. -Counseled on home exercise therapy and supportive care. -IM Depo-Medrol and Toradol. -Could consider physical therapy.

## 2022-02-05 NOTE — Progress Notes (Signed)
  Cynthia [name] - 61 y.o. female MRN [id number]  Date of birth: 03-31-1961  SUBJECTIVE:  Including CC & ROS.  No chief complaint on file.   Cynthia [name] is a 61 y.o. female that is presenting with acute low back pain.  The pain started a few days ago.  She felt that when she is bending over.  She has a long history of radicular pain but this feels different.  She has a trip to [place] upcoming.    Review of Systems See HPI   HISTORY: Past Medical, Surgical, Social, and Family History Reviewed & Updated per EMR.   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   Acustic Neuroma - Brain tumor    GERD (gastroesophageal reflux disease)    History of diabetes mellitus    was prediabetes, off metformin   Iron deficiency anemia, unspecified 02/18/2013   Obesity    296 lbs (highest weight in 2012)    Tendonitis, Achilles, left     Past Surgical History:  Procedure Laterality Date   ABLATION     and bladder sling   ACHILLES TENDON REPAIR     x2   ANAL FISSURE REPAIR  2012   BACK SURGERY     CHOLECYSTECTOMY     COLONOSCOPY WITH PROPOFOL N/A 01/20/2021   Procedure: COLONOSCOPY WITH PROPOFOL;  Surgeon: Carol Ada, MD;  Location: [facility];  Service: Endoscopy;  Laterality: N/A;   CRANIOTOMY  09/13/2015   with excision of acoustic neuroma   ESOPHAGOGASTRODUODENOSCOPY (EGD) WITH PROPOFOL N/A 07/22/2020   Procedure: ESOPHAGOGASTRODUODENOSCOPY (EGD) WITH PROPOFOL;  Surgeon: Carol Ada, MD;  Location: [facility];  Service: Endoscopy;  Laterality: N/A;   IMPLANTATION BONE ANCHORED  HEARING AID     MOUTH SURGERY  2015   3 implants   OVARIAN CYST REMOVAL     x4   THYROID LOBECTOMY Right 04/28/2015   Partial    TOE FUSION Left      PHYSICAL EXAM:  VS: BP 118/82 (BP Location: Left Arm, Patient Position: Sitting)   Ht '5\' 8"'$  (1.727 m)   Wt 199 lb (90.3 kg)   LMP 06/18/2006 (Approximate)   BMI 30.26 kg/m  Physical Exam Gen: NAD, alert, cooperative with exam, well-appearing MSK:  Neurovascularly intact       ASSESSMENT & PLAN:   Strain of lumbar region Acutely occurring.  Symptoms seem more associated with spasm at this point.  Does have underlying structural change appreciated on previous MRI but no radicular pain today. -Counseled on home exercise therapy and supportive care. -IM Depo-Medrol and Toradol. -Could consider physical therapy.

## 2022-02-05 NOTE — Patient Instructions (Signed)
Nice to meet you Please use heat  Please continue the exercises   Please send me a message in MyChart with any questions or updates.  Please follow up with Dr. Darene Lamer in 2-4 weeks if still having pain.   --Dr. Raeford Razor

## 2022-03-22 ENCOUNTER — Other Ambulatory Visit: Payer: Self-pay | Admitting: Physician Assistant

## 2022-03-22 DIAGNOSIS — F900 Attention-deficit hyperactivity disorder, predominantly inattentive type: Secondary | ICD-10-CM

## 2022-03-22 MED ORDER — AMPHETAMINE-DEXTROAMPHET ER 30 MG PO CP24: 30.0000 mg | ORAL_CAPSULE | Freq: Every day | ORAL | 0 refills | Status: DC

## 2022-03-22 NOTE — Telephone Encounter (Signed)
Last visit  for ADHD 11/28/21  Last fill 01/26/22 for #30

## 2022-04-27 ENCOUNTER — Encounter: Payer: Self-pay | Admitting: Student

## 2022-04-30 ENCOUNTER — Encounter: Payer: Self-pay | Admitting: Neurosurgery

## 2022-05-02 ENCOUNTER — Other Ambulatory Visit: Payer: Self-pay | Admitting: Student

## 2022-05-02 DIAGNOSIS — M5416 Radiculopathy, lumbar region: Secondary | ICD-10-CM

## 2022-05-14 ENCOUNTER — Ambulatory Visit
Admission: RE | Admit: 2022-05-14 | Discharge: 2022-05-14 | Disposition: A | Discharge status: Home / Self Care | Payer: BC Managed Care – PPO | Source: Ambulatory Visit | Attending: Student | Admitting: Student

## 2022-05-14 DIAGNOSIS — M5416 Radiculopathy, lumbar region: Secondary | ICD-10-CM

## 2022-05-14 MED ORDER — METHYLPREDNISOLONE ACETATE 40 MG/ML INJ SUSP (RADIOLOG
80.0000 mg | Freq: Once | INTRAMUSCULAR | Status: AC
  Administered 2022-05-14: 80 mg via EPIDURAL

## 2022-05-14 MED ORDER — IOPAMIDOL (ISOVUE-M 200) INJECTION 41%
1.0000 mL | Freq: Once | INTRAMUSCULAR | Status: AC
  Administered 2022-05-14: 1 mL via EPIDURAL

## 2022-05-14 NOTE — Discharge Instructions (Signed)

## 2022-05-15 ENCOUNTER — Ambulatory Visit: Payer: BC Managed Care – PPO | Admitting: Physician Assistant

## 2022-05-18 ENCOUNTER — Ambulatory Visit
Admission: RE | Admit: 2022-05-18 | Discharge: 2022-05-18 | Disposition: A | Discharge status: Home / Self Care | Payer: BC Managed Care – PPO | Source: Ambulatory Visit | Attending: Family Medicine | Admitting: Family Medicine

## 2022-05-18 VITALS — BP 122/82 | HR 73 | Temp 98.6°F | Resp 18 | Ht 68.0 in | Wt 195.0 lb

## 2022-05-18 DIAGNOSIS — R059 Cough, unspecified: Secondary | ICD-10-CM

## 2022-05-18 MED ORDER — PREDNISONE 50 MG PO TABS: ORAL_TABLET | ORAL | 0 refills | Status: DC

## 2022-05-18 MED ORDER — AZITHROMYCIN 250 MG PO TABS: 250.0000 mg | ORAL_TABLET | Freq: Every day | ORAL | 0 refills | Status: DC

## 2022-05-18 MED ORDER — BENZONATATE 200 MG PO CAPS: 200.0000 mg | ORAL_CAPSULE | Freq: Three times a day (TID) | ORAL | 0 refills | Status: AC | PRN

## 2022-05-18 MED ORDER — PROMETHAZINE-DM 6.25-15 MG/5ML PO SYRP: 5.0000 mL | ORAL_SOLUTION | Freq: Two times a day (BID) | ORAL | 0 refills | Status: DC | PRN

## 2022-05-18 NOTE — ED Provider Notes (Signed)
[facility]    CSN: [id number] Arrival date & time: 05/18/22  1058      History   Chief Complaint Chief Complaint  Patient presents with   Cough    I have lost my voice and dry cough and no congestion or fever.   I feel fine but can't quit coughing and having asthma attacks - Entered by patient    HPI Cynthia [name] is a 61 y.o. female.   HPI 61 year old female presents with dry cough for 4 to 5 days worse at night when laying down.  Unable to sleep due to cough.  MH significant for obesity, HTN, and OSA.  Past Medical History:  Diagnosis Date   Acoustic neuroma ([place])    ADD (attention deficit disorder)    and OCD-on Vyvanse   Asthma    w/out status asthmaticus   Awareness under anesthesia    Balance problem    after acoustic neuroma excision   Chronic back pain    Complication of anesthesia    unrecognized difficult intubation   Constipation, chronic    IBS, CIC   Difficult airway    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   Acustic Neuroma - Brain tumor    GERD (gastroesophageal reflux disease)    History of diabetes mellitus    was prediabetes, off metformin   Iron deficiency anemia, unspecified 02/18/2013   Obesity    296 lbs (highest weight in 2012)    Tendonitis, Achilles, left     Patient Active Problem List   Diagnosis Date Noted   Strain of lumbar region 02/05/2022   No energy 11/29/2021   History of sleep apnea 11/29/2021   Orthostatic hypotension 08/14/2021   Irritation of left radial nerve 03/16/2021   Right-sided chest wall pain 11/28/2020   History of endometrial ablation 10/28/2020   History of partial thyroidectomy 10/28/2020   Chronic gastritis without bleeding 05/27/2020   Hiatal hernia 05/27/2020   Nausea 05/27/2020   Tremor of right hand 02/26/2020   Hearing loss of right ear 11/24/2019   Basal cell carcinoma of left upper arm 10/21/2019   Atrophic vaginitis 06/03/2019   Family history of osteoporosis 03/05/2019   Post-menopausal 03/05/2019    Motion sickness 12/26/2017   Compression of right radial nerve 06/26/2017   Fatty liver disease, nonalcoholic 78/29/5621   Left renal mass 03/26/2017   Obesity (BMI 30.0-34.9) 03/26/2017   Chronic left-sided low back pain 09/19/2016   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[place]) 12/06/2015   Deafness in left ear 12/06/2015   Hashimoto's thyroiditis 30/86/5784   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   Hypokalemia 02/13/2015   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[place]) 02/11/2015   HSV infection 08/26/2014   ADD (attention deficit disorder) 05/11/2014   Chronic constipation 05/11/2014   Essential (primary) hypertension 05/11/2014   Gastro-esophageal reflux disease without esophagitis 05/11/2014   Asthma, mild persistent 05/11/2014   Interdigital neuralgia 04/14/2013   Iron deficiency anemia, unspecified 02/18/2013    Past Surgical History:  Procedure Laterality Date   ABLATION     and bladder sling   ACHILLES TENDON REPAIR     x2   ANAL FISSURE REPAIR  2012   BACK SURGERY     CHOLECYSTECTOMY     COLONOSCOPY WITH PROPOFOL N/A 01/20/2021   Procedure: COLONOSCOPY WITH PROPOFOL;  Surgeon: Carol Ada, MD;  Location: [facility];  Service: Endoscopy;  Laterality: N/A;   CRANIOTOMY  09/13/2015   with excision of acoustic neuroma   ESOPHAGOGASTRODUODENOSCOPY (EGD) WITH PROPOFOL N/A 07/22/2020  Procedure: ESOPHAGOGASTRODUODENOSCOPY (EGD) WITH PROPOFOL;  Surgeon: Carol Ada, MD;  Location: [facility];  Service: Endoscopy;  Laterality: N/A;   IMPLANTATION BONE ANCHORED HEARING AID     MOUTH SURGERY  2015   3 implants   OVARIAN CYST REMOVAL     x4   THYROID LOBECTOMY Right 04/28/2015   Partial    TOE FUSION Left     OB History     Gravida  0   Para  0   Term  0   Preterm  0   AB  0   Living  0      SAB  0   IAB  0   Ectopic  0   Multiple  0   Live Births               Home Medications    Prior to Admission medications   Medication Sig Start Date End Date Taking?  Authorizing Provider  albuterol (VENTOLIN HFA) 108 (90 Base) MCG/ACT inhaler Inhale 2 puffs into the lungs every 6 (six) hours as needed for wheezing. 12/22/20  Yes Terrilyn Saver, NP  amphetamine-dextroamphetamine (ADDERALL XR) 30 MG 24 hr capsule Take 1 capsule (30 mg total) by mouth daily. 05/22/22  Yes Breeback, Jade L, PA-C  amphetamine-dextroamphetamine (ADDERALL XR) 30 MG 24 hr capsule Take 1 capsule (30 mg total) by mouth daily. 04/22/22  Yes Breeback, Jade L, PA-C  amphetamine-dextroamphetamine (ADDERALL XR) 30 MG 24 hr capsule Take 1 capsule (30 mg total) by mouth daily. 03/22/22  Yes Breeback, Jade L, PA-C  azithromycin (ZITHROMAX) 250 MG tablet Take 1 tablet (250 mg total) by mouth daily. Take first 2 tablets together, then 1 every day until finished. 05/18/22  Yes Eliezer Lofts, FNP  benzonatate (TESSALON) 200 MG capsule Take 1 capsule (200 mg total) by mouth 3 (three) times daily as needed for up to 7 days. 05/18/22 05/25/22 Yes Eliezer Lofts, FNP  buPROPion (WELLBUTRIN XL) 300 MG 24 hr tablet TAKE 1 TABLET DAILY . 06/05/21  Yes Breeback, Jade L, PA-C  cyclobenzaprine (FLEXERIL) 10 MG tablet Take 1 tablet (10 mg total) by mouth at bedtime. 11/28/21  Yes Breeback, Jade L, PA-C  dexlansoprazole (DEXILANT) 60 MG capsule Take 1 capsule by mouth daily. 02/27/21  Yes [provider]  docusate sodium (COLACE) 50 MG capsule Take 50 mg by mouth daily as needed for moderate constipation.   Yes [provider]  levothyroxine (SYNTHROID) 75 MCG tablet Take 1 tablet (75 mcg total) by mouth daily before breakfast. 11/28/21  Yes Breeback, Jade L, PA-C  LINZESS 290 MCG CAPS capsule Take 290 mcg by mouth daily. 09/23/16  Yes [provider]  predniSONE (DELTASONE) 50 MG tablet Take 1 tab p.o. daily for 5 days. 05/18/22  Yes Eliezer Lofts, FNP  promethazine-dextromethorphan (PROMETHAZINE-DM) 6.25-15 MG/5ML syrup Take 5 mLs by mouth 2 (two) times daily as needed for cough. 05/18/22  Yes  Eliezer Lofts, FNP  triamterene-hydrochlorothiazide (MAXZIDE-25) 37.5-25 MG tablet Take 1 tablet by mouth daily. 11/28/21  Yes Breeback, Jade L, PA-C  valACYclovir (VALTREX) 500 MG tablet With new symptoms, take 1 tab BID x 3 days 12/21/21  Yes Megan Salon, MD    Family History Family History  Problem Relation Age of Onset   Thyroid disease Mother    Heart Problems Mother        vavle issue   Dystonia Mother    Hypercholesterolemia Mother    Heart attack Father    Cancer  Father        esophageal   Heart disease Father    Lung cancer Paternal Grandfather    Heart disease Paternal Grandfather    Heart disease Maternal Grandfather    Heart disease Paternal Grandmother    Heart disease Other        paternal and maternal side   Cancer Brother        esophageal/stomach cancer    Social History Social History   Tobacco Use   Smoking status: Never   Smokeless tobacco: Never  Vaping Use   Vaping Use: Never used  Substance Use Topics   Alcohol use: Yes    Alcohol/week: 1.0 - 2.0 standard drink of alcohol    Types: 1 - 2 Standard drinks or equivalent per week   Drug use: No     Allergies   Mobic [meloxicam] and Multivitamins   Review of Systems Review of Systems  HENT:  Positive for sore throat and voice change. Negative for congestion.   Respiratory:  Positive for cough.      Physical Exam Triage Vital Signs ED Triage Vitals  Enc Vitals Group     BP      Pulse      Resp      Temp      Temp src      SpO2      Weight      Height      Head Circumference      Peak Flow      Pain Score      Pain Loc      Pain Edu?      Excl. in [place]?    No data found.  Updated Vital Signs BP 122/82 (BP Location: Right Arm)   Pulse 73   Temp 98.6 F (37 C) (Oral)   Resp 18   Ht '5\' 8"'$  (1.727 m)   Wt 195 lb (88.5 kg)   LMP 06/18/2006 (Approximate)   SpO2 100%   BMI 29.65 kg/m    Physical Exam Vitals and nursing note reviewed.  Constitutional:      Appearance:  Normal appearance. She is obese. She is ill-appearing.  HENT:     Head: Normocephalic and atraumatic.     Right Ear: Tympanic membrane, ear canal and external ear normal.     Left Ear: Tympanic membrane, ear canal and external ear normal.     Nose: Nose normal.     Mouth/Throat:     Mouth: Mucous membranes are moist.     Pharynx: Oropharynx is clear.  Eyes:     Extraocular Movements: Extraocular movements intact.     Conjunctiva/sclera: Conjunctivae normal.     Pupils: Pupils are equal, round, and reactive to light.  Cardiovascular:     Rate and Rhythm: Normal rate and regular rhythm.     Pulses: Normal pulses.     Heart sounds: Normal heart sounds.  Pulmonary:     Effort: Pulmonary effort is normal.     Breath sounds: Normal breath sounds. No wheezing, rhonchi or rales.     Comments: Frequent nonproductive cough noted on exam Musculoskeletal:        General: Normal range of motion.     Cervical back: Normal range of motion and neck supple.  Skin:    General: Skin is warm and dry.  Neurological:     General: No focal deficit present.     Mental Status: She is alert and oriented to person, place,  and time. Mental status is at baseline.      UC Treatments / Results  Labs (all labs ordered are listed, but only abnormal results are displayed) Labs Reviewed - No data to display  EKG   Radiology No results found.  Procedures Procedures (including critical care time)  Medications Ordered in UC Medications - No data to display  Initial Impression / Assessment and Plan / UC Course  I have reviewed the triage vital signs and the nursing notes.  Pertinent labs & imaging results that were available during my care of the patient were reviewed by me and considered in my medical decision making (see chart for details).     MDM: 1. Cough-Advised patient to take medications as directed with food to completion.  Instructed patient to take prednisone with Zithromax daily for the  next 5 days.  Advised may use Tessalon daily or as needed for cough.  Advised may use Promethazine DM at night for cough due to sedative effects.  Encouraged patient to increase daily water intake to 64 ounces per day while taking these medications.  Advised if symptoms worsen and/or unresolved please follow-up with PCP or here for further evaluation. Final Clinical Impressions(s) / UC Diagnoses   Final diagnoses:  Cough, unspecified type     Discharge Instructions      Advised patient to take medications as directed with food to completion.  Instructed patient to take prednisone with Zithromax daily for the next 5 days.  Advised may use Tessalon daily or as needed for cough.  Advised may use Promethazine DM at night for cough due to sedative effects.  Encouraged patient to increase daily water intake to 64 ounces per day while taking these medications.  Advised if symptoms worsen and/or unresolved please follow-up with PCP or here for further evaluation.     ED Prescriptions     Medication Sig Dispense Auth. Provider   azithromycin (ZITHROMAX) 250 MG tablet Take 1 tablet (250 mg total) by mouth daily. Take first 2 tablets together, then 1 every day until finished. 6 tablet Eliezer Lofts, FNP   predniSONE (DELTASONE) 50 MG tablet Take 1 tab p.o. daily for 5 days. 5 tablet Eliezer Lofts, FNP   benzonatate (TESSALON) 200 MG capsule Take 1 capsule (200 mg total) by mouth 3 (three) times daily as needed for up to 7 days. 40 capsule Eliezer Lofts, FNP   promethazine-dextromethorphan (PROMETHAZINE-DM) 6.25-15 MG/5ML syrup Take 5 mLs by mouth 2 (two) times daily as needed for cough. 118 mL Eliezer Lofts, FNP      PDMP not reviewed this encounter.   Eliezer Lofts, [place] 05/18/22 1248

## 2022-05-18 NOTE — Discharge Instructions (Addendum)
Advised patient to take medications as directed with food to completion.  Instructed patient to take prednisone with Zithromax daily for the next 5 days.  Advised may use Tessalon daily or as needed for cough.  Advised may use Promethazine DM at night for cough due to sedative effects.  Encouraged patient to increase daily water intake to 64 ounces per day while taking these medications.  Advised if symptoms worsen and/or unresolved please follow-up with PCP or here for further evaluation.

## 2022-05-18 NOTE — ED Triage Notes (Signed)
Patient c/o dry cough x 4-5 days, worse at night when laying down.  Unable to sleep.  No other sx's.  Patient has taken Mucinex w/o relief.

## 2022-05-19 ENCOUNTER — Telehealth: Payer: Self-pay

## 2022-05-19 NOTE — Telephone Encounter (Signed)
TC to f/u with pt after yesterday's visit to [facility]. Pt states she is feeling somewhat better and reports no problems or questions.

## 2022-05-23 ENCOUNTER — Other Ambulatory Visit: Payer: Self-pay | Admitting: Physician Assistant

## 2022-05-25 ENCOUNTER — Telehealth: Payer: Self-pay | Admitting: Neurology

## 2022-05-25 NOTE — Telephone Encounter (Signed)
Patient called and LVM asking why her Celebrex was denied. Let her know this is no longer on her medication list. It was last prescribed in June 2022 and discontinued in August 2022.   Patient states she had stopped for awhile, but restarted due to back pain. She wants to stay off it over the weekend to see how she does and will discuss with Quinton Voth at her appt on Tuesday.   FYI.

## 2022-05-27 ENCOUNTER — Other Ambulatory Visit: Payer: Self-pay | Admitting: Physician Assistant

## 2022-05-27 DIAGNOSIS — M545 Low back pain, unspecified: Secondary | ICD-10-CM

## 2022-05-28 ENCOUNTER — Other Ambulatory Visit: Payer: Self-pay | Admitting: Physician Assistant

## 2022-05-28 DIAGNOSIS — E063 Autoimmune thyroiditis: Secondary | ICD-10-CM

## 2022-05-30 ENCOUNTER — Ambulatory Visit: Payer: BC Managed Care – PPO | Admitting: Physician Assistant

## 2022-05-30 VITALS — BP 128/76 | HR 76 | Ht 68.0 in | Wt 199.0 lb

## 2022-05-30 DIAGNOSIS — M545 Low back pain, unspecified: Secondary | ICD-10-CM | POA: Diagnosis not present

## 2022-05-30 DIAGNOSIS — J4530 Mild persistent asthma, uncomplicated: Secondary | ICD-10-CM

## 2022-05-30 DIAGNOSIS — F900 Attention-deficit hyperactivity disorder, predominantly inattentive type: Secondary | ICD-10-CM | POA: Diagnosis not present

## 2022-05-30 DIAGNOSIS — R058 Other specified cough: Secondary | ICD-10-CM | POA: Insufficient documentation

## 2022-05-30 DIAGNOSIS — G8929 Other chronic pain: Secondary | ICD-10-CM

## 2022-05-30 MED ORDER — AMPHETAMINE-DEXTROAMPHET ER 30 MG PO CP24: 30.0000 mg | ORAL_CAPSULE | Freq: Every day | ORAL | 0 refills | Status: DC

## 2022-05-30 MED ORDER — ALBUTEROL SULFATE HFA 108 (90 BASE) MCG/ACT IN AERS: 2.0000 | INHALATION_SPRAY | Freq: Four times a day (QID) | RESPIRATORY_TRACT | 11 refills | Status: AC | PRN

## 2022-05-30 MED ORDER — PREDNISONE 20 MG PO TABS: ORAL_TABLET | ORAL | 0 refills | Status: DC

## 2022-05-30 MED ORDER — CELECOXIB 200 MG PO CAPS: 200.0000 mg | ORAL_CAPSULE | Freq: Two times a day (BID) | ORAL | 3 refills | Status: DC

## 2022-05-30 NOTE — Progress Notes (Signed)
Established Patient Office Visit  Subjective   Patient ID: Cynthia [name], female    DOB: 1960/10/29  Age: 61 y.o. MRN: [id number]  Chief Complaint  Patient presents with   Follow-up    HPI Patient is a 61 year old obese female with hypertension, ADHD, Hashimoto's thyroiditis who presents to the clinic for refills.  She is doing well on her Adderall.  She has no concerns or complaints.  She is doing well at work.  She actually got a new job that she is very excited about.  She denies any increase in anxiety, headaches or insomnia.  She has had some upper respiratory symptoms for the last week.  She denies any fever, body aches, shortness of breath.  She has had a cough that is more dry.  She is taking over-the-counter Delsym with some relief. Hse has albuterol inhaler and also using some which is helping.   She has ongoing chronic left-sided back pain.  She denies any radiation into her legs but some into her buttocks.  She had ran out of Celebrex and she feels like she can tell the difference.  She would like to stay on it.   .. 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[place]) 12/06/2015   Deafness in left ear 12/06/2015   Chronic left-sided low back pain 09/19/2016   Fatty liver disease, nonalcoholic 00/86/7619   Left renal mass 03/26/2017   Obesity (BMI 30.0-34.9) 03/26/2017   Compression of right radial nerve 06/26/2017   Motion sickness 12/26/2017   Family  history of osteoporosis 03/05/2019   Post-menopausal 03/05/2019   Atrophic vaginitis 06/03/2019   Basal cell carcinoma of left upper arm 10/21/2019   Hearing loss of right ear 11/24/2019   Tremor of right hand 02/26/2020   Chronic gastritis without bleeding 05/27/2020   Hiatal hernia 05/27/2020   Nausea 05/27/2020   History of endometrial ablation 10/28/2020   History of partial thyroidectomy 10/28/2020   Right-sided chest wall pain 11/28/2020   Irritation of left radial nerve 03/16/2021   Orthostatic hypotension 08/14/2021   No energy 11/29/2021   History of sleep apnea 11/29/2021   Strain of lumbar region 02/05/2022   Post-viral cough syndrome 05/30/2022   Resolved Ambulatory Problems    Diagnosis Date Noted   IFG (impaired fasting glucose) 09/13/2013   Backache 09/13/2013   Wheezing 09/13/2013   Swelling of limb 09/13/2013   Obesity, unspecified 09/13/2013   Low back pain 05/11/2014   Thyromegaly 02/13/2015   Subconjunctival hemorrhage of right eye 06/28/2015   Acoustic neuroma ([place]) 12/10/2014   Left ear hearing loss 10/14/2015   Costochondritis 03/21/2016   Irritable 10/17/2016   Thyroid adenoma 05/03/2015   Exposure to influenza 05/07/2018   Accidental fall 10/27/2018   Past Medical History:  Diagnosis Date   Asthma    Awareness under anesthesia    Balance problem    Chronic back pain    Complication of anesthesia    Constipation, chronic    Difficult airway    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   GERD (gastroesophageal  reflux disease)    History of diabetes mellitus    Obesity    Tendonitis, Achilles, left     ROS See HPI.    Objective:     BP 128/76   Pulse 76   Ht '5\' 8"'$  (1.727 m)   Wt 199 lb (90.3 kg)   LMP 06/18/2006 (Approximate)   SpO2 100%   BMI 30.26 kg/m  BP Readings from Last 3 Encounters:  05/30/22 128/76  05/18/22 122/82  05/14/22 131/73   Wt Readings from Last 3 Encounters:  05/30/22 199 lb (90.3 kg)  05/18/22 195 lb (88.5 kg)  02/05/22 199  lb (90.3 kg)      Physical Exam Constitutional:      Appearance: Normal appearance. She is obese.  HENT:     Head: Normocephalic.     Right Ear: Tympanic membrane normal.     Left Ear: Tympanic membrane normal.     Nose: Nose normal.     Mouth/Throat:     Mouth: Mucous membranes are moist.     Pharynx: No oropharyngeal exudate or posterior oropharyngeal erythema.  Eyes:     Conjunctiva/sclera: Conjunctivae normal.  Cardiovascular:     Rate and Rhythm: Normal rate and regular rhythm.  Pulmonary:     Effort: Pulmonary effort is normal.     Breath sounds: Normal breath sounds.  Musculoskeletal:     Cervical back: Neck supple. No tenderness.     Right lower leg: No edema.     Left lower leg: No edema.  Lymphadenopathy:     Cervical: No cervical adenopathy.  Neurological:     General: No focal deficit present.     Mental Status: She is alert and oriented to person, place, and time.  Psychiatric:        Mood and Affect: Mood normal.         Assessment & Plan:  [name] KitchenMarland KitchenEriyana was seen today for follow-up.  Diagnoses and all orders for this visit:  Attention deficit hyperactivity disorder (ADHD), predominantly inattentive type -     amphetamine-dextroamphetamine (ADDERALL XR) 30 MG 24 hr capsule; Take 1 capsule (30 mg total) by mouth daily. -     amphetamine-dextroamphetamine (ADDERALL XR) 30 MG 24 hr capsule; Take 1 capsule (30 mg total) by mouth daily. -     amphetamine-dextroamphetamine (ADDERALL XR) 30 MG 24 hr capsule; Take 1 capsule (30 mg total) by mouth daily.  Mild persistent asthma without complication -     albuterol (VENTOLIN HFA) 108 (90 Base) MCG/ACT inhaler; Inhale 2 puffs into the lungs every 6 (six) hours as needed for wheezing.  Chronic left-sided low back pain without sciatica -     celecoxib (CELEBREX) 200 MG capsule; Take 1 capsule (200 mg total) by mouth 2 (two) times daily.  Post-viral cough syndrome -     predniSONE (DELTASONE) 20 MG tablet; Take 3  tablets for 3 days, take 2 tablets for 3 days, take 1 tablet for 3 days, take 1/2 tablet for 4 days.   Adderall refilled for 3 months.  Ok to follow up in 6 months.   Restart celebrex daily.   No signs of bacterial infection.  Prednisone taper for post viral cough Continue albuterol as needed Follow up if not improving or worsening.   Not feeling 100 percent and being treated for post viral cough. Will not give flu shot today.    Iran Planas, PA-C

## 2022-05-31 ENCOUNTER — Other Ambulatory Visit: Payer: Self-pay | Admitting: Physician Assistant

## 2022-05-31 DIAGNOSIS — I10 Essential (primary) hypertension: Secondary | ICD-10-CM

## 2022-06-01 ENCOUNTER — Ambulatory Visit: Payer: BC Managed Care – PPO | Admitting: Physician Assistant

## 2022-06-04 ENCOUNTER — Encounter: Payer: Self-pay | Admitting: Physician Assistant

## 2022-06-19 ENCOUNTER — Encounter: Payer: Self-pay | Admitting: Family Medicine

## 2022-06-19 ENCOUNTER — Ambulatory Visit (INDEPENDENT_AMBULATORY_CARE_PROVIDER_SITE_OTHER): Payer: BC Managed Care – PPO | Admitting: Family Medicine

## 2022-06-19 VITALS — BP 130/86 | Ht 68.0 in | Wt 199.0 lb

## 2022-06-19 DIAGNOSIS — S39012A Strain of muscle, fascia and tendon of lower back, initial encounter: Secondary | ICD-10-CM

## 2022-06-19 MED ORDER — PREDNISONE 5 MG PO TABS: ORAL_TABLET | ORAL | 0 refills | Status: DC

## 2022-06-19 MED ORDER — HYDROCODONE-ACETAMINOPHEN 5-325 MG PO TABS: 1.0000 | ORAL_TABLET | Freq: Three times a day (TID) | ORAL | 0 refills | Status: DC | PRN

## 2022-06-19 MED ORDER — KETOROLAC TROMETHAMINE 30 MG/ML IJ SOLN
30.0000 mg | Freq: Once | INTRAMUSCULAR | Status: AC
  Administered 2022-06-19: 30 mg via INTRAMUSCULAR

## 2022-06-19 NOTE — Progress Notes (Signed)
  Cynthia [name] - 62 y.o. female MRN [id number]  Date of birth: [birth year]  SUBJECTIVE:  Including CC & ROS.  No chief complaint on file.   Cynthia [name] is a 62 y.o. female that is presenting with acute on chronic low back pain.  She is having left-sided lower back pain with no radicular component.  It is similar to her previous pain that she is experienced.  She did receive a steroid epidural injection in November and 2 rounds of prednisone in December.  She is transitioning to a new job in February.  She has an upcoming trip to [place] this weekend.   Review of Systems See HPI   HISTORY: Past Medical, Surgical, Social, and Family History Reviewed & Updated per EMR.   Pertinent Historical Findings include:  Past Medical History:  Diagnosis Date   Acoustic neuroma ([place])    ADD (attention deficit disorder)    and OCD-on Vyvanse   Asthma    w/out status asthmaticus   Awareness under anesthesia    Balance problem    after acoustic neuroma excision   Chronic back pain    Complication of anesthesia    unrecognized difficult intubation   Constipation, chronic    IBS, CIC   Difficult airway    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   Acustic Neuroma - Brain tumor    GERD (gastroesophageal reflux disease)    History of diabetes mellitus    was prediabetes, off metformin   Iron deficiency anemia, unspecified 02/18/2013   Obesity    296 lbs (highest weight in 2012)    Tendonitis, Achilles, left     Past Surgical History:  Procedure Laterality Date   ABLATION     and bladder sling   ACHILLES TENDON REPAIR     x2   ANAL FISSURE REPAIR  2012   BACK SURGERY     CHOLECYSTECTOMY     COLONOSCOPY WITH PROPOFOL N/A 01/20/2021   Procedure: COLONOSCOPY WITH PROPOFOL;  Surgeon: Carol Ada, MD;  Location: [facility];  Service: Endoscopy;  Laterality: N/A;   CRANIOTOMY  09/13/2015   with excision of acoustic neuroma   ESOPHAGOGASTRODUODENOSCOPY (EGD) WITH PROPOFOL N/A 07/22/2020   Procedure:  ESOPHAGOGASTRODUODENOSCOPY (EGD) WITH PROPOFOL;  Surgeon: Carol Ada, MD;  [facility];  Service: Endoscopy;  Laterality: N/A;   IMPLANTATION BONE ANCHORED HEARING AID     MOUTH SURGERY  2015   3 implants   OVARIAN CYST REMOVAL     x4   THYROID LOBECTOMY Right 04/28/2015   Partial    TOE FUSION Left      PHYSICAL EXAM:  VS: BP 130/86 (BP Location: Right Arm, Patient Position: Sitting)   Ht '5\' 8"'$  (1.727 m)   Wt 199 lb (90.3 kg)   LMP 06/18/2006 (Approximate)   BMI 30.26 kg/m  Physical Exam Gen: NAD, alert, cooperative with exam, well-appearing MSK:  Neurovascularly intact       ASSESSMENT & PLAN:   Strain of lumbar region Acutely occurring with symptoms more consistent with a spasm and strain at this time. -Counseled on home exercise therapy and supportive care. -IM Toradol. -Prednisone. Trying to hold off with her recent rounds of steroids -Norco. -Could consider physical therapy.

## 2022-06-19 NOTE — Assessment & Plan Note (Signed)
Acutely occurring with symptoms more consistent with a spasm and strain at this time. -Counseled on home exercise therapy and supportive care. -IM Toradol. -Prednisone. Trying to hold off with her recent rounds of steroids -Norco. -Could consider physical therapy.

## 2022-06-19 NOTE — Patient Instructions (Signed)
Good to see you Please try heat  Please try the exercises  Please only take the prednisone if you have to  Please use the pain medicine as needed  Please send me a message in MyChart with any questions or updates.  Please see me back in 4 weeks.   --Dr. Raeford Razor

## 2022-06-20 ENCOUNTER — Ambulatory Visit: Payer: BC Managed Care – PPO | Admitting: Family Medicine

## 2022-07-02 ENCOUNTER — Ambulatory Visit (INDEPENDENT_AMBULATORY_CARE_PROVIDER_SITE_OTHER): Payer: BC Managed Care – PPO

## 2022-07-02 ENCOUNTER — Encounter: Payer: Self-pay | Admitting: Physician Assistant

## 2022-07-02 ENCOUNTER — Ambulatory Visit (INDEPENDENT_AMBULATORY_CARE_PROVIDER_SITE_OTHER): Payer: BC Managed Care – PPO | Admitting: Physician Assistant

## 2022-07-02 VITALS — BP 127/69 | HR 80 | Ht 68.0 in | Wt 202.0 lb

## 2022-07-02 DIAGNOSIS — R052 Subacute cough: Secondary | ICD-10-CM

## 2022-07-02 LAB — POCT INFLUENZA A/B
Influenza A, POC: NEGATIVE
Influenza B, POC: NEGATIVE

## 2022-07-02 MED ORDER — BENZONATATE 200 MG PO CAPS: 200.0000 mg | ORAL_CAPSULE | Freq: Three times a day (TID) | ORAL | 0 refills | Status: DC | PRN

## 2022-07-02 MED ORDER — HYDROCOD POLI-CHLORPHE POLI ER 10-8 MG/5ML PO SUER: 5.0000 mL | Freq: Two times a day (BID) | ORAL | 0 refills | Status: DC | PRN

## 2022-07-02 NOTE — Progress Notes (Signed)
Acute Office Visit  Subjective:     Patient ID: Cynthia [name], female    DOB: 06-06-1961, 62 y.o.   MRN: [id number]  Chief Complaint  Patient presents with   Cough    Dry cough set since thanksgiving. Patient stated seen at urgent care back in December. Patient reported rib pain from cough    Cough Associated symptoms include a sore throat and shortness of breath. Pertinent negatives include no chest pain, chills, fever, hemoptysis, myalgias or wheezing.   Patient is in today for a cough. She has been coughing since Thanksgiving, and it had gotten better until this past weekend where she reports that the cough had gotten more frequent and intense. She describes it as a dry cough and she is not coughing up any sputum. She had gone to an urgent care in December for the cough, where she was prescribed Tessalon Perles which alleviated her symptoms. She reports that she had been traveling and has just gotten back from a trip to [place]. She tested negative at home for covid. Albuterol not really help with cough.   Review of Systems  Constitutional:  Positive for malaise/fatigue. Negative for chills and fever.  HENT:  Positive for sore throat. Negative for congestion and sinus pain.   Respiratory:  Positive for cough and shortness of breath. Negative for hemoptysis, sputum production and wheezing.   Cardiovascular:  Negative for chest pain.  Gastrointestinal:  Negative for abdominal pain and vomiting.  Musculoskeletal:  Negative for myalgias.         Objective:    BP 127/69   Pulse 80   Ht '5\' 8"'$  (1.727 m)   Wt 202 lb 0.6 oz (91.6 kg)   LMP 06/18/2006 (Approximate)   SpO2 100%   BMI 30.72 kg/m  BP Readings from Last 3 Encounters:  07/02/22 127/69  06/19/22 130/86  05/30/22 128/76   Wt Readings from Last 3 Encounters:  07/02/22 202 lb 0.6 oz (91.6 kg)  06/19/22 199 lb (90.3 kg)  05/30/22 199 lb (90.3 kg)    .[name] Kitchen Results for orders placed or performed in visit on 07/02/22  POCT  Influenza A/B  Result Value Ref Range   Influenza A, POC Negative Negative   Influenza B, POC Negative Negative     Physical Exam Constitutional:      General: She is not in acute distress.    Appearance: Normal appearance. She is not ill-appearing.  HENT:     Right Ear: Tympanic membrane, ear canal and external ear normal.     Left Ear: Tympanic membrane, ear canal and external ear normal.     Mouth/Throat:     Mouth: Mucous membranes are dry.     Pharynx: Posterior oropharyngeal erythema present. No oropharyngeal exudate.  Cardiovascular:     Rate and Rhythm: Normal rate and regular rhythm.     Heart sounds: Normal heart sounds.  Pulmonary:     Effort: Pulmonary effort is normal. No respiratory distress.     Breath sounds: Normal breath sounds. No wheezing.  Chest:     Chest wall: No tenderness.  Abdominal:     General: Bowel sounds are normal.     Palpations: Abdomen is soft.     Tenderness: There is no abdominal tenderness.  Neurological:     Mental Status: She is alert.         Assessment & Plan:   [name] KitchenMarland KitchenMackinzie was seen today for cough.  Diagnoses and all orders for this visit:  Subacute  cough -     POCT Influenza A/B -     DG Chest 2 View; Future -     chlorpheniramine-HYDROcodone (TUSSIONEX) 10-8 MG/5ML; Take 5 mLs by mouth every 12 (twelve) hours as needed for cough (cough, will cause drowsiness.). -     benzonatate (TESSALON) 200 MG capsule; Take 1 capsule (200 mg total) by mouth 3 (three) times daily as needed.  Other orders -     Cancel: POCT rapid strep A   Negative for strep and flu and covid Likely post viral cough and perhaps some acute viral infection or even temperature change that worsened it Vitals look great PE looks good No signs of bacterial infection CXR ordered Use prednisone that she has at home  Tessalon and tussionex given for cough Rest and hydrate Follow up as needed if symptoms persist or worsen.

## 2022-07-02 NOTE — Progress Notes (Signed)
Normal chest xray. Lungs look great. No concerns. Treatment plan stays the same.

## 2022-07-02 NOTE — Addendum Note (Signed)
Addended by: Donella Stade on: 07/02/2022 03:22 PM   Modules accepted: Orders

## 2022-07-02 NOTE — Progress Notes (Signed)
Resent tussionex to different pharmacy that has it in [place].

## 2022-07-16 ENCOUNTER — Ambulatory Visit: Payer: BC Managed Care – PPO | Admitting: Family Medicine

## 2022-07-23 ENCOUNTER — Other Ambulatory Visit: Payer: Self-pay | Admitting: Physician Assistant

## 2022-07-23 DIAGNOSIS — R454 Irritability and anger: Secondary | ICD-10-CM

## 2022-07-24 ENCOUNTER — Other Ambulatory Visit: Payer: Self-pay | Admitting: Physician Assistant

## 2022-07-24 DIAGNOSIS — R454 Irritability and anger: Secondary | ICD-10-CM

## 2022-08-27 ENCOUNTER — Other Ambulatory Visit: Payer: Self-pay | Admitting: Physician Assistant

## 2022-08-27 DIAGNOSIS — E063 Autoimmune thyroiditis: Secondary | ICD-10-CM

## 2022-08-29 ENCOUNTER — Ambulatory Visit: Payer: BC Managed Care – PPO | Admitting: Physician Assistant

## 2022-08-29 ENCOUNTER — Encounter: Payer: Self-pay | Admitting: Physician Assistant

## 2022-08-29 VITALS — BP 102/68 | HR 78 | Ht 68.0 in | Wt 200.0 lb

## 2022-08-29 DIAGNOSIS — M545 Low back pain, unspecified: Secondary | ICD-10-CM | POA: Diagnosis not present

## 2022-08-29 DIAGNOSIS — F900 Attention-deficit hyperactivity disorder, predominantly inattentive type: Secondary | ICD-10-CM | POA: Diagnosis not present

## 2022-08-29 DIAGNOSIS — E063 Autoimmune thyroiditis: Secondary | ICD-10-CM | POA: Diagnosis not present

## 2022-08-29 DIAGNOSIS — R454 Irritability and anger: Secondary | ICD-10-CM

## 2022-08-29 DIAGNOSIS — G8929 Other chronic pain: Secondary | ICD-10-CM

## 2022-08-29 DIAGNOSIS — H9191 Unspecified hearing loss, right ear: Secondary | ICD-10-CM

## 2022-08-29 MED ORDER — BUPROPION HCL ER (XL) 300 MG PO TB24: 300.0000 mg | ORAL_TABLET | Freq: Every day | ORAL | 1 refills | Status: DC

## 2022-08-29 MED ORDER — LEVOTHYROXINE SODIUM 75 MCG PO TABS: ORAL_TABLET | ORAL | 3 refills | Status: DC

## 2022-08-29 MED ORDER — CYCLOBENZAPRINE HCL 10 MG PO TABS: 10.0000 mg | ORAL_TABLET | Freq: Every day | ORAL | 1 refills | Status: DC

## 2022-08-29 MED ORDER — CELECOXIB 200 MG PO CAPS: 200.0000 mg | ORAL_CAPSULE | Freq: Two times a day (BID) | ORAL | 3 refills | Status: DC

## 2022-08-29 MED ORDER — AMPHETAMINE-DEXTROAMPHET ER 30 MG PO CP24: 30.0000 mg | ORAL_CAPSULE | Freq: Every day | ORAL | 0 refills | Status: DC

## 2022-08-29 NOTE — Progress Notes (Signed)
Established Patient Office Visit  Subjective   Patient ID: Cynthia [name], female    DOB: 16-Jul-1960  Age: 62 y.o. 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   Pt just recovered from right ear infection but her hearing has not come back. She has ENT appt today. This was really scary for her. She denies any current ear pain, fever, chills, sinus pressure.   She is doing well with medications and no concerns. Her focus is good. She loves her new job.   She has been more anxious with losing her hearing for days but overall mood is good.   .. 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[facility]) 12/06/2015   Deafness in left ear 12/06/2015   Chronic left-sided low back pain 09/19/2016   Irritable 10/17/2016   Fatty liver disease, nonalcoholic [id number]   Left renal mass 03/26/2017   Obesity (BMI 30.0-34.9) 03/26/2017   Compression of right radial nerve 06/26/2017   Motion sickness 12/26/2017   Family history of osteoporosis 03/05/2019   Post-menopausal 03/05/2019   Atrophic vaginitis 06/03/2019   Basal cell carcinoma of left upper arm 10/21/2019   Hearing loss of right ear 11/24/2019   Tremor of right hand 02/26/2020   Chronic gastritis without bleeding 05/27/2020   Hiatal hernia 05/27/2020   Nausea 05/27/2020   History of  endometrial ablation 10/28/2020   History of partial thyroidectomy 10/28/2020   Right-sided chest wall pain 11/28/2020   Irritation of left radial nerve 03/16/2021   Orthostatic hypotension 08/14/2021   No energy 11/29/2021   History of sleep apnea 11/29/2021   Strain of lumbar region 02/05/2022   Post-viral cough syndrome 05/30/2022   Resolved Ambulatory Problems    Diagnosis Date Noted   IFG (impaired fasting glucose) 09/13/2013   Backache 09/13/2013   Wheezing 09/13/2013   Swelling of limb 09/13/2013   Obesity, unspecified 09/13/2013   Low back pain 05/11/2014   Thyromegaly 02/13/2015   Subconjunctival hemorrhage of right eye 06/28/2015   Acoustic neuroma ([facility]) 12/10/2014   Left ear hearing loss 10/14/2015   Costochondritis 03/21/2016   Thyroid adenoma 05/03/2015   Exposure to influenza 05/07/2018   Accidental fall 10/27/2018   Past Medical History:  Diagnosis Date   Asthma    Awareness under anesthesia    Balance problem    Chronic back pain    Complication of anesthesia    Constipation, chronic    Difficult airway    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   GERD (gastroesophageal reflux disease)    History of diabetes mellitus    Obesity    Tendonitis, Achilles, left      ROS    Objective:     BP 102/68   Pulse 78   Ht '5\' 8"'$  (1.727 m)   Wt 200 lb (90.7 kg)   LMP 06/18/2006 (Approximate)   SpO2 98%   BMI 30.41  kg/m  BP Readings from Last 3 Encounters:  08/29/22 102/68  07/02/22 127/69  06/19/22 130/86   Wt Readings from Last 3 Encounters:  08/29/22 200 lb (90.7 kg)  07/02/22 202 lb 0.6 oz (91.6 kg)  06/19/22 199 lb (90.3 kg)  ..    08/29/2022    7:32 AM 08/29/2022    7:15 AM 07/02/2022   10:47 AM 05/30/2022    2:30 PM 12/21/2021    8:20 AM  Depression screen PHQ 2/9  Decreased Interest 0 0  0 0  Down, Depressed, Hopeless 0 0 0 0 0  PHQ - 2 Score 0 0 0 0 0  Altered sleeping 1      Tired, decreased energy 1      Change in appetite 1      Feeling bad or  failure about yourself  0      Trouble concentrating 1      Moving slowly or fidgety/restless 0      Suicidal thoughts 0      PHQ-9 Score 4      Difficult doing work/chores Not difficult at all       .[name] Kitchen    08/29/2022    7:33 AM 11/28/2020    9:51 AM 05/27/2020    8:19 AM 02/26/2020    8:45 AM  GAD 7 : Generalized Anxiety Score  Nervous, Anxious, on Edge 0 1 0 0  Control/stop worrying 0 0 0 0  Worry too much - different things 0 0 1 0  Trouble relaxing '1 1 1 1  '$ Restless '1 1 1 2  '$ Easily annoyed or irritable 0 '2 2 1  '$ Afraid - awful might happen 0 0 0 0  Total GAD 7 Score '2 5 5 4  '$ Anxiety Difficulty Not difficult at all Not difficult at all Not difficult at all Not difficult at all        Physical Exam Constitutional:      Appearance: Normal appearance. She is obese.  HENT:     Head: Normocephalic.     Right Ear: Ear canal normal. There is no impacted cerumen.     Left Ear: Tympanic membrane, ear canal and external ear normal. There is no impacted cerumen.     Ears:     Comments: Right TM with dried blood     Nose: Nose normal.  Eyes:     Conjunctiva/sclera: Conjunctivae normal.  Neck:     Vascular: No carotid bruit.  Cardiovascular:     Rate and Rhythm: Normal rate and regular rhythm.  Pulmonary:     Effort: Pulmonary effort is normal.  Musculoskeletal:     Cervical back: Normal range of motion and neck supple. No rigidity or tenderness.     Right lower leg: No edema.     Left lower leg: No edema.  Lymphadenopathy:     Cervical: No cervical adenopathy.  Neurological:     General: No focal deficit present.     Mental Status: She is alert and oriented to person, place, and time.  Psychiatric:        Mood and Affect: Mood normal.       The 10-year ASCVD risk score (Arnett DK, et al., 2019) is: 1.9%    Assessment & Plan:  [name] KitchenMarland KitchenFujie was seen today for follow-up.  Diagnoses and all orders for this visit:  Attention deficit hyperactivity disorder (ADHD),  predominantly inattentive type -     amphetamine-dextroamphetamine (ADDERALL XR) 30 MG 24 hr capsule; Take  1 capsule (30 mg total) by mouth daily. -     amphetamine-dextroamphetamine (ADDERALL XR) 30 MG 24 hr capsule; Take 1 capsule (30 mg total) by mouth daily. -     amphetamine-dextroamphetamine (ADDERALL XR) 30 MG 24 hr capsule; Take 1 capsule (30 mg total) by mouth daily.  Irritable -     buPROPion (WELLBUTRIN XL) 300 MG 24 hr tablet; Take 1 tablet (300 mg total) by mouth daily.  Chronic left-sided low back pain without sciatica -     celecoxib (CELEBREX) 200 MG capsule; Take 1 capsule (200 mg total) by mouth 2 (two) times daily. -     cyclobenzaprine (FLEXERIL) 10 MG tablet; Take 1 tablet (10 mg total) by mouth at bedtime.  Hashimoto's thyroiditis -     levothyroxine (SYNTHROID) 75 MCG tablet; TAKE 1 TABLET DAILY BEFORE BREAKFAST  Hearing loss of right ear, unspecified hearing loss type   Labs UTD.  Refills sent Follow up in 6 months ENT follow up on hearing loss in right ear. Reassured pt that infection appeared cleared but dried blood over TM.     Iran Planas, PA-C

## 2022-10-01 ENCOUNTER — Encounter: Payer: Self-pay | Admitting: *Deleted

## 2022-10-11 ENCOUNTER — Telehealth: Payer: Self-pay | Admitting: Physician Assistant

## 2022-10-11 DIAGNOSIS — E039 Hypothyroidism, unspecified: Secondary | ICD-10-CM

## 2022-10-11 NOTE — Telephone Encounter (Signed)
Patient is requesting Blood Test for thyroid

## 2022-10-18 ENCOUNTER — Other Ambulatory Visit: Payer: Self-pay | Admitting: Physician Assistant

## 2022-10-18 DIAGNOSIS — Z1231 Encounter for screening mammogram for malignant neoplasm of breast: Secondary | ICD-10-CM

## 2022-10-19 ENCOUNTER — Other Ambulatory Visit: Payer: Self-pay | Admitting: Student

## 2022-10-19 DIAGNOSIS — M5416 Radiculopathy, lumbar region: Secondary | ICD-10-CM

## 2022-10-23 LAB — TSH: TSH: 3.31 mIU/L (ref 0.40–4.50)

## 2022-10-23 LAB — T4, FREE: Free T4: 1.3 ng/dL (ref 0.8–1.8)

## 2022-10-23 LAB — T3, FREE: T3, Free: 2.5 pg/mL (ref 2.3–4.2)

## 2022-10-23 NOTE — Progress Notes (Signed)
Cynthia [name],   TSH showing a trend up some but your free levels have stayed exactly the same. Stay on same dose for now. Recheck in 6 months or sooner if having any hypo(low) thyroid symptoms.

## 2022-10-24 ENCOUNTER — Ambulatory Visit
Admission: RE | Admit: 2022-10-24 | Discharge: 2022-10-24 | Disposition: A | Discharge status: Home / Self Care | Payer: BC Managed Care – PPO | Source: Ambulatory Visit | Attending: Student | Admitting: Student

## 2022-10-24 DIAGNOSIS — M5416 Radiculopathy, lumbar region: Secondary | ICD-10-CM

## 2022-10-24 MED ORDER — IOPAMIDOL (ISOVUE-M 200) INJECTION 41%
1.0000 mL | Freq: Once | INTRAMUSCULAR | Status: AC
  Administered 2022-10-24: 1 mL via EPIDURAL

## 2022-10-24 MED ORDER — METHYLPREDNISOLONE ACETATE 40 MG/ML INJ SUSP (RADIOLOG
80.0000 mg | Freq: Once | INTRAMUSCULAR | Status: AC
  Administered 2022-10-24: 80 mg via EPIDURAL

## 2022-10-24 NOTE — Discharge Instructions (Signed)

## 2022-11-21 ENCOUNTER — Ambulatory Visit (INDEPENDENT_AMBULATORY_CARE_PROVIDER_SITE_OTHER): Payer: BC Managed Care – PPO

## 2022-11-21 DIAGNOSIS — Z1231 Encounter for screening mammogram for malignant neoplasm of breast: Secondary | ICD-10-CM | POA: Diagnosis not present

## 2022-11-23 NOTE — Progress Notes (Signed)
Normal mammogram. Follow up in 1 year.

## 2022-12-17 ENCOUNTER — Other Ambulatory Visit: Payer: Self-pay | Admitting: Physician Assistant

## 2022-12-17 DIAGNOSIS — F900 Attention-deficit hyperactivity disorder, predominantly inattentive type: Secondary | ICD-10-CM

## 2022-12-18 MED ORDER — AMPHETAMINE-DEXTROAMPHET ER 30 MG PO CP24
30.0000 mg | ORAL_CAPSULE | Freq: Every day | ORAL | 0 refills | Status: DC
Start: 2023-01-18 — End: 2023-09-06

## 2022-12-18 MED ORDER — AMPHETAMINE-DEXTROAMPHET ER 30 MG PO CP24
30.0000 mg | ORAL_CAPSULE | Freq: Every day | ORAL | 0 refills | Status: DC
Start: 2022-12-18 — End: 2023-03-08

## 2022-12-18 MED ORDER — AMPHETAMINE-DEXTROAMPHET ER 30 MG PO CP24
30.0000 mg | ORAL_CAPSULE | Freq: Every day | ORAL | 0 refills | Status: DC
Start: 2023-02-18 — End: 2023-09-06

## 2023-01-02 ENCOUNTER — Other Ambulatory Visit: Payer: Self-pay | Admitting: Neurosurgery

## 2023-01-02 DIAGNOSIS — M5412 Radiculopathy, cervical region: Secondary | ICD-10-CM

## 2023-01-02 DIAGNOSIS — M5440 Lumbago with sciatica, unspecified side: Secondary | ICD-10-CM

## 2023-01-20 ENCOUNTER — Ambulatory Visit
Admission: RE | Admit: 2023-01-20 | Discharge: 2023-01-20 | Disposition: A | Discharge status: Home / Self Care | Payer: BC Managed Care – PPO | Source: Ambulatory Visit | Attending: Neurosurgery | Admitting: Neurosurgery

## 2023-01-20 DIAGNOSIS — M5440 Lumbago with sciatica, unspecified side: Secondary | ICD-10-CM

## 2023-01-20 DIAGNOSIS — M5412 Radiculopathy, cervical region: Secondary | ICD-10-CM

## 2023-02-22 ENCOUNTER — Telehealth: Payer: Self-pay | Admitting: Physician Assistant

## 2023-02-22 ENCOUNTER — Encounter: Payer: Self-pay | Admitting: Physician Assistant

## 2023-02-22 MED ORDER — LISDEXAMFETAMINE DIMESYLATE 60 MG PO CAPS: 60.0000 mg | ORAL_CAPSULE | ORAL | 0 refills | Status: DC

## 2023-02-22 NOTE — Telephone Encounter (Addendum)
Patient called in stating that her pharmacy is out of Adderall, wants to know if she can get vyvanse instead. Also states that she is going tout of town later today Please advise.   [facility]  [address] [place], [place]

## 2023-02-22 NOTE — Telephone Encounter (Signed)
Sent vyvanse to pharmacy. 

## 2023-03-08 ENCOUNTER — Ambulatory Visit: Payer: BC Managed Care – PPO | Admitting: Physician Assistant

## 2023-03-08 ENCOUNTER — Encounter: Payer: Self-pay | Admitting: Physician Assistant

## 2023-03-08 VITALS — BP 116/54 | HR 76 | Ht 68.0 in | Wt 200.2 lb

## 2023-03-08 DIAGNOSIS — D508 Other iron deficiency anemias: Secondary | ICD-10-CM

## 2023-03-08 DIAGNOSIS — E063 Autoimmune thyroiditis: Secondary | ICD-10-CM

## 2023-03-08 DIAGNOSIS — G8929 Other chronic pain: Secondary | ICD-10-CM

## 2023-03-08 DIAGNOSIS — I10 Essential (primary) hypertension: Secondary | ICD-10-CM

## 2023-03-08 DIAGNOSIS — F900 Attention-deficit hyperactivity disorder, predominantly inattentive type: Secondary | ICD-10-CM | POA: Diagnosis not present

## 2023-03-08 DIAGNOSIS — M545 Low back pain, unspecified: Secondary | ICD-10-CM | POA: Diagnosis not present

## 2023-03-08 DIAGNOSIS — E6609 Other obesity due to excess calories: Secondary | ICD-10-CM

## 2023-03-08 DIAGNOSIS — Z8669 Personal history of other diseases of the nervous system and sense organs: Secondary | ICD-10-CM

## 2023-03-08 DIAGNOSIS — R5383 Other fatigue: Secondary | ICD-10-CM

## 2023-03-08 DIAGNOSIS — G478 Other sleep disorders: Secondary | ICD-10-CM

## 2023-03-08 DIAGNOSIS — Z23 Encounter for immunization: Secondary | ICD-10-CM

## 2023-03-08 DIAGNOSIS — R454 Irritability and anger: Secondary | ICD-10-CM

## 2023-03-08 DIAGNOSIS — Z780 Asymptomatic menopausal state: Secondary | ICD-10-CM

## 2023-03-08 MED ORDER — CELECOXIB 200 MG PO CAPS
200.0000 mg | ORAL_CAPSULE | Freq: Two times a day (BID) | ORAL | 3 refills | Status: DC
Start: 2023-03-08 — End: 2024-03-06

## 2023-03-08 MED ORDER — AMPHETAMINE-DEXTROAMPHET ER 30 MG PO CP24: 30.0000 mg | ORAL_CAPSULE | Freq: Every day | ORAL | 0 refills | Status: DC

## 2023-03-08 MED ORDER — BUPROPION HCL ER (XL) 300 MG PO TB24
300.0000 mg | ORAL_TABLET | Freq: Every day | ORAL | 1 refills | Status: DC
Start: 2023-03-08 — End: 2023-04-03

## 2023-03-08 MED ORDER — NALTREXONE-BUPROPION HCL ER 8-90 MG PO TB12
ORAL_TABLET | ORAL | 0 refills | Status: DC
Start: 2023-03-08 — End: 2023-09-06

## 2023-03-08 MED ORDER — AMPHETAMINE-DEXTROAMPHET ER 30 MG PO CP24
30.0000 mg | ORAL_CAPSULE | Freq: Every day | ORAL | 0 refills | Status: DC
Start: 2023-03-08 — End: 2023-05-29

## 2023-03-08 MED ORDER — AMPHETAMINE-DEXTROAMPHET ER 30 MG PO CP24
30.0000 mg | ORAL_CAPSULE | Freq: Every day | ORAL | 0 refills | Status: DC
Start: 2023-05-07 — End: 2023-09-06

## 2023-03-08 MED ORDER — TRIAMTERENE-HCTZ 37.5-25 MG PO TABS
1.0000 | ORAL_TABLET | Freq: Every day | ORAL | 3 refills | Status: DC
Start: 2023-03-08 — End: 2023-05-13

## 2023-03-08 NOTE — Progress Notes (Signed)
Established Patient Office Visit  Subjective   Patient ID: Cynthia [name], female    DOB: 1960-11-21  Age: 62 y.o. MRN: [id number]  Chief Complaint  Patient presents with   Medical Management of Chronic Issues    Medication f/u    HPI  Patient is a 62 year old female in today for medication refills and management of ADHD, hypothyroidism, increased fatigue, and trouble sleeping, Patient reports to taking her adderall daily and feels that she maintains good focus. Requests refills on medication today.   Patient does complain of increased fatigue over the past 3 months and trouble falling asleep. Patient denies taking her adderall later in the day or any diet changes. Patient states she does not take a multivitamin or B12 supplement and feels this may be contributing. Patient does have a history of OSA but no longer uses a CPAP due to an improvement in sleep from previous weight loss.   .. 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[facility]) 12/06/2015   Deafness in left ear 12/06/2015   Chronic left-sided low back pain 09/19/2016   Irritable 10/17/2016   Fatty liver disease, nonalcoholic 03/26/2017   Left renal mass 03/26/2017   Obesity (BMI 30.0-34.9) 03/26/2017   Compression of right radial nerve 06/26/2017   Motion sickness 12/26/2017   Family history of osteoporosis 03/05/2019   Post-menopausal 03/05/2019   Atrophic vaginitis 06/03/2019   Basal cell carcinoma of left  upper arm 10/21/2019   Hearing loss of right ear 11/24/2019   Tremor of right hand 02/26/2020   Chronic gastritis without bleeding 05/27/2020   Hiatal hernia 05/27/2020   Nausea 05/27/2020   History of endometrial ablation 10/28/2020   History of partial thyroidectomy 10/28/2020   Right-sided chest wall pain 11/28/2020   Irritation of left radial nerve 03/16/2021   Orthostatic hypotension 08/14/2021   No energy 11/29/2021   History of sleep apnea 11/29/2021   Strain of lumbar region 02/05/2022   Post-viral cough syndrome 05/30/2022   Class 1 obesity due to excess calories without serious comorbidity with body mass index (BMI) of 30.0 to 30.9 in adult 03/08/2023   Non-restorative sleep 03/08/2023   Resolved Ambulatory Problems    Diagnosis Date Noted   IFG (impaired fasting glucose) 09/13/2013   Backache 09/13/2013   Wheezing 09/13/2013   Swelling of limb 09/13/2013   Obesity, unspecified 09/13/2013   Low back pain 05/11/2014   Thyromegaly 02/13/2015   Subconjunctival hemorrhage of right eye 06/28/2015   Acoustic neuroma ([facility]) 12/10/2014   Left ear hearing loss 10/14/2015   Costochondritis 03/21/2016   Thyroid adenoma 05/03/2015   Exposure to influenza 05/07/2018   Accidental fall 10/27/2018   Past Medical History:  Diagnosis Date   Asthma    Awareness under anesthesia    Balance problem    Chronic back pain    Complication of anesthesia    Constipation, chronic    Difficult airway    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   GERD (gastroesophageal reflux disease)    History of diabetes mellitus  Obesity    Tendonitis, Achilles, left     ROS See HPI.    Objective:     BP (!) 116/54   Pulse 76   Ht 5\' 8"  (1.727 m)   Wt 200 lb 4 oz (90.8 kg)   LMP 06/18/2006 (Approximate)   SpO2 100%   BMI 30.45 kg/m  BP Readings from Last 3 Encounters:  03/08/23 (!) 116/54  10/24/22 129/83  08/29/22 102/68   Wt Readings from Last 3 Encounters:  03/08/23 200 lb 4 oz (90.8 kg)   08/29/22 200 lb (90.7 kg)  07/02/22 202 lb 0.6 oz (91.6 kg)      Physical Exam Constitutional:      Appearance: Normal appearance. She is obese.  HENT:     Head: Normocephalic.  Cardiovascular:     Rate and Rhythm: Normal rate and regular rhythm.     Pulses: Normal pulses.     Heart sounds: Normal heart sounds.  Pulmonary:     Effort: Pulmonary effort is normal.     Breath sounds: Normal breath sounds.  Musculoskeletal:     Right lower leg: No edema.     Left lower leg: No edema.  Neurological:     General: No focal deficit present.     Mental Status: She is alert and oriented to person, place, and time.  Psychiatric:        Mood and Affect: Mood normal.      The 10-year ASCVD risk score (Arnett DK, et al., 2019) is: 2.8%    Assessment & Plan:  [name] KitchenMarland KitchenLucy was seen today for medical management of chronic issues.  Diagnoses and all orders for this visit:  Iron deficiency anemia secondary to inadequate dietary iron intake -     CBC w/Diff/Platelet -     Fe+TIBC+Fer -     CMP14+EGFR  Irritable -     buPROPion (WELLBUTRIN XL) 300 MG 24 hr tablet; Take 1 tablet (300 mg total) by mouth daily. -     TSH + free T4 -     B12 and Folate Panel -     VITAMIN D 25 Hydroxy (Vit-D Deficiency, Fractures) -     CBC w/Diff/Platelet -     Fe+TIBC+Fer -     CMP14+EGFR  Attention deficit hyperactivity disorder (ADHD), predominantly inattentive type -     amphetamine-dextroamphetamine (ADDERALL XR) 30 MG 24 hr capsule; Take 1 capsule (30 mg total) by mouth daily. -     amphetamine-dextroamphetamine (ADDERALL XR) 30 MG 24 hr capsule; Take 1 capsule (30 mg total) by mouth daily. -     amphetamine-dextroamphetamine (ADDERALL XR) 30 MG 24 hr capsule; Take 1 capsule (30 mg total) by mouth daily.  Chronic left-sided low back pain without sciatica -     celecoxib (CELEBREX) 200 MG capsule; Take 1 capsule (200 mg total) by mouth 2 (two) times daily.  Essential (primary) hypertension -      triamterene-hydrochlorothiazide (MAXZIDE-25) 37.5-25 MG tablet; Take 1 tablet by mouth daily. -     CMP14+EGFR  Hashimoto's thyroiditis -     TSH + free T4  No energy -     TSH + free T4 -     B12 and Folate Panel -     VITAMIN D 25 Hydroxy (Vit-D Deficiency, Fractures) -     CBC w/Diff/Platelet -     Fe+TIBC+Fer -     CMP14+EGFR -     Home sleep test; Future  Post-menopausal -  TSH + free T4 -     B12 and Folate Panel -     VITAMIN D 25 Hydroxy (Vit-D Deficiency, Fractures) -     CBC w/Diff/Platelet -     Fe+TIBC+Fer -     CMP14+EGFR  Encounter for immunization  Non-restorative sleep -     Home sleep test; Future  History of sleep apnea -     Home sleep test; Future  Class 1 obesity due to excess calories without serious comorbidity with body mass index (BMI) of 30.0 to 30.9 in adult -     Naltrexone-buPROPion HCl ER 8-90 MG TB12; 1 tab daily for week 1, then 1 tab BID for week 2, then 2 tab PO qAM and 1 tab PO qPM for week 3, then 2 tabs BID. -     Home sleep test; Future  Need for influenza vaccination -     Flu vaccine trivalent PF, 6mos and older(Flulaval,Afluria,Fluarix,Fluzone)    Ordering labs today Refills sent Place order for at-home sleep study due to increased fatigue and history of OSA Checking B12, folate, iron, vitamin D, and TSH due to increased fatigue and decreased energy Will try Contrave for weight loss pending insurance approval. Sent to mail order pharmacy. Will need to stop wellbutrin 300mg  she is on now. Patient did not tolerate Ozempic well due to constipation.   Adderall refilled for 3 months Follow up in 6 months   Return in about 6 months (around 09/05/2023).    Tandy Gaw, PA-C

## 2023-03-08 NOTE — Therapy (Signed)
OUTPATIENT PHYSICAL THERAPY CERVICAL AND THORACOLUMBAR EVALUATION   Patient Name: Cynthia [name] MRN: [id number] DOB:1960/09/22, 62 y.o., female Today's Date: 03/13/2023   END OF SESSION:  PT End of Session - 03/13/23 0844     Visit Number 1    Date for PT Re-Evaluation 05/08/23    Authorization Type [employer]    PT Start Time [phone]    PT Stop Time 0935    PT Time Calculation (min) 51 min    Activity Tolerance Patient tolerated treatment well    Behavior During Therapy [facility] for tasks assessed/performed             Past Medical History:  Diagnosis Date   Acoustic neuroma (HCC)    ADD (attention deficit disorder)    and OCD-on Vyvanse   Asthma    w/out status asthmaticus   Awareness under anesthesia    Balance problem    after acoustic neuroma excision   Chronic back pain    Complication of anesthesia    unrecognized difficult intubation   Constipation, chronic    IBS, CIC   Difficult airway    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   Acustic Neuroma - Brain tumor    GERD (gastroesophageal reflux disease)    History of diabetes mellitus    was prediabetes, off metformin   Iron deficiency anemia, unspecified 02/18/2013   Obesity    296 lbs (highest weight in 2012)    Tendonitis, Achilles, left    Past Surgical History:  Procedure Laterality Date   ABLATION     and bladder sling   ACHILLES TENDON REPAIR     x2   ANAL FISSURE REPAIR  2012   BACK SURGERY     CHOLECYSTECTOMY     COLONOSCOPY WITH PROPOFOL N/A 01/20/2021   Procedure: COLONOSCOPY WITH PROPOFOL;  Surgeon: Jeani Hawking, MD;  Location: [facility];  Service: Endoscopy;  Laterality: N/A;   CRANIOTOMY  09/13/2015   with excision of acoustic neuroma   ESOPHAGOGASTRODUODENOSCOPY (EGD) WITH PROPOFOL N/A 07/22/2020   Procedure: ESOPHAGOGASTRODUODENOSCOPY (EGD) WITH PROPOFOL;  Surgeon: Jeani Hawking, MD;  Location: [facility];  Service: Endoscopy;  Laterality: N/A;   IMPLANTATION BONE ANCHORED HEARING AID     MOUTH SURGERY  2015   3  implants   OVARIAN CYST REMOVAL     x4   THYROID LOBECTOMY Right 04/28/2015   Partial    TOE FUSION Left    Patient Active Problem List   Diagnosis Date Noted   Class 1 obesity due to excess calories without serious comorbidity with body mass index (BMI) of 30.0 to 30.9 in adult 03/08/2023   Non-restorative sleep 03/08/2023   Post-viral cough syndrome 05/30/2022   Strain of lumbar region 02/05/2022   No energy 11/29/2021   History of sleep apnea 11/29/2021   Orthostatic hypotension 08/14/2021   Irritation of left radial nerve 03/16/2021   Right-sided chest wall pain 11/28/2020   History of endometrial ablation 10/28/2020   History of partial thyroidectomy 10/28/2020   Chronic gastritis without bleeding 05/27/2020   Hiatal hernia 05/27/2020   Nausea 05/27/2020   Tremor of right hand 02/26/2020   Hearing loss of right ear 11/24/2019   Basal cell carcinoma of left upper arm 10/21/2019   Atrophic vaginitis 06/03/2019   Family history of osteoporosis 03/05/2019   Post-menopausal 03/05/2019   Motion sickness 12/26/2017   Compression of right radial nerve 06/26/2017   Fatty liver disease, nonalcoholic 03/26/2017   Left renal mass 03/26/2017   Obesity (BMI 30.0-34.9)  03/26/2017   Irritable 10/17/2016   Chronic left-sided low back pain 09/19/2016   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HCC) 12/06/2015   Deafness in left ear 12/06/2015   Hashimoto's thyroiditis 06/14/2015   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   Hypokalemia 02/13/2015   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HCC) 02/11/2015   HSV infection 08/26/2014   ADD (attention deficit disorder) 05/11/2014   Chronic constipation 05/11/2014   Essential (primary) hypertension 05/11/2014   Gastro-esophageal reflux disease without esophagitis 05/11/2014   Asthma, mild persistent 05/11/2014   Interdigital neuralgia 04/14/2013   Iron deficiency anemia, unspecified 02/18/2013    PCP: Jomarie Longs, PA-C   REFERRING PROVIDER: Donalee Citrin, MD    REFERRING DIAG:  M54.40 (ICD-10-CM) - Lumbago with sciatica, unspecified side  M54.12 (ICD-10-CM) - Cervical radiculopathy at C6   THERAPY DIAG:  Radiculopathy, cervical region  Chronic bilateral low back pain with left-sided sciatica  Abnormal posture  Muscle weakness (generalized)  Other muscle spasm  RATIONALE FOR EVALUATION AND TREATMENT: Rehabilitation  ONSET DATE: ~6 months for neck pain, chronic LBP  NEXT MD VISIT: Unknown   SUBJECTIVE:                                                                                                                                                                                                         SUBJECTIVE STATEMENT: Pt reports the neck pain started ~6 months ago w/o known MOI. She notes frequent intermittent numbness and tingling onto her L arm to the radial side of her hand. She reports long h/o LBP with L sided sciatica for which she typically get injections every 4-5 months - feels like she is due for another injection. She reports episodes where her back "locks up" when trying to stand back up after bending over.  PAIN: Are you having pain? Yes: NPRS scale: 2-3/10 currently, at worst 5/10 Pain location: L>R neck, upper shoulder with intermittent L UE numbness and tingling to radial side of hand Pain description: tingly, occasional sharp, numb Aggravating factors: variable - sometimes with sitting, also when walking in the park Relieving factors: nothing  Are you having pain? Yes: NPRS scale: 2/10 currently, at worst 8/10 Pain location: L>R low back with sciatic pain and tingling into distal L LE Pain description: achy in low back, occasional sharp when when attempting to return to stand after bending over, sharp sciatic pain Aggravating factors: bending over, lifting, walking for sciatic pain Relieving factors: injections, Tiger Balm patches, heat, exercises from prior PT HEP  PERTINENT HISTORY:  Acoustic neuroma -  deafness in L ear, ADD,  asthma, balance problem (after acoustic neuroma), chronic back pain, GERD, DM, orthostatic hypotension, Hashimoto's thyroiditis  PRECAUTIONS: None  HAND DOMINANCE: Right  RED FLAGS: None  WEIGHT BEARING RESTRICTIONS: No  FALLS:  Has patient fallen in last 6 months? Yes. 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 Advanced degenerative foraminal impingement on the left at C5-6. 2. Moderate degenerative foraminal narrowing on the right at C6-7. 3. Diffusely patent spinal canal.  01/20/23 - MRI Lumbar spine: IMPRESSION: 1. Generalized lumbar spine degeneration greatest at L5-S1. No significant change when compared to 2022. 2. L5-S1 left foraminal impingement. Noncompressive bilateral foraminal narrowing at L2-3. 3. Diffusely patent spinal canal.  PATIENT SURVEYS:  Modified Oswestry 10 / 50 = 20.0 %  NDI 9 / 50 = 18.0 %  SCREENING FOR RED FLAGS: Bowel or bladder incontinence: No Spinal tumors: No Cauda equina syndrome: No Compression fracture: No Abdominal aneurysm: No  COGNITION: Overall cognitive status: Within functional limits for tasks assessed     SENSATION: WFL Intermittent L UE numbness and tingling, L LE sciatica and tingling  MUSCLE LENGTH: Hamstrings: Mild tight B ITB: Mild tight  B Piriformis: Mild tight B Hip IR: Mod/severe tight B Hip flexors: Mild/mod tight L>R Quads: WFL  POSTURE:  rounded shoulders, forward head, decreased lumbar lordosis, and increased thoracic kyphosis  PALPATION: TTP with increased muscle tension in L>R cervicothoracic and lumbar paraspinals, upper shoulder and periscapular muscles, as well as glutes/piriformis  CERVICAL ROM:   Active ROM Eval  Flexion 34 ^  Extension 44 - made tingling go away  Right lateral flexion 30  Left lateral flexion 34  Right rotation 63  Left rotation 51 - triggered tingling    (Blank rows = not tested, ^ - increased pain)  UPPER EXTREMITY ROM:  Active ROM Right eval Left eval  Shoulder flexion 142 124  Shoulder extension 49 42  Shoulder abduction 161 148  Shoulder adduction    Shoulder internal rotation FIR WNL FIR WNL  Shoulder external rotation FER T3 FER T1  6 (Blank rows = not tested)  UPPER EXTREMITY MMT:  MMT Right eval Left eval  Shoulder flexion 4+ 4  Shoulder extension 4+ 4  Shoulder abduction 4+ 4  Shoulder adduction    Shoulder internal rotation 4+ 4-  Shoulder external rotation 4 4-  Middle trapezius 4- 4-  Lower trapezius 3+ 3+  (Blank rows = not tested)  LUMBAR ROM:   Active  Eval  Flexion Hands to mid shins  Extension 25% limited  Right lateral flexion Hand to fibular head  Left lateral flexion Hand to lateral knee  Right rotation WFL  Left rotation WFL    (Blank rows = not tested)  LOWER EXTREMITY ROM:    B LE AROM grossly WFL  LOWER EXTREMITY MMT:    MMT Right eval Left  eval  Hip flexion 4 4-  Hip extension 4+ 4+  Hip abduction 4 3+  Hip adduction 4- 4  Hip internal rotation 5 4+  Hip external rotation 4 4-  Knee flexion 5 4+  Knee extension 5 4  Ankle dorsiflexion 5 5  Ankle plantarflexion    Ankle inversion    Ankle eversion      (Blank rows = not tested)  FUNCTIONAL TESTS:  Functional gait assessment: TBA   TODAY'S TREATMENT:   03/13/23 -  Eval only   PATIENT EDUCATION:  Education details: PT eval findings, anticipated POC, need for further assessment of FGA, and postural awareness  Person educated: Patient Education method: Explanation Education comprehension: verbalized understanding  HOME EXERCISE PROGRAM: TBD   ASSESSMENT:  CLINICAL IMPRESSION: Cloee Honegger is a 62 y.o. female who was referred to physical therapy for evaluation and treatment for cervical radiculopathy at C6 as well as low back pain with bilateral sciatica.  She reports onset of neck pain and cervical radiculopathy ~6 months ago, not too long after she changed jobs and started working from home.  She reports she works on the computer with a full desk set up including separate monitoring keyboard and feels like she has a good ergonomic set up.  LBP and sciatica is more chronic in nature but also seems to be worsening of late, with patient reporting episodes where her back will lock up on her, particularly when trying to return to standing from being bent over.  She typically manages her LBP and sciatica with regular injections every 4-5 months, but notes decreased response to most recent injection.  Current deficits include abnormal posture; increased muscle tension with TTP throughout paraspinal, periscapular and buttock muscles; limited cervical, shoulder and lumbar ROM; decreased flexibility; as well as core/postural and proximal units LE weakness.  She also notes worsening of her balance since she was diagnosed with an acoustic neuroma and has had 2 falls in the past 6 months, hence she will benefit from further balance testing with FGA in upcoming visits.  Tyeasha will benefit from skilled PT to address above deficits to improve mobility and activity tolerance with decreased pain interference.   OBJECTIVE IMPAIRMENTS: Abnormal gait, decreased activity tolerance, decreased balance, decreased coordination, decreased endurance, decreased knowledge of condition,  decreased mobility, difficulty walking, decreased ROM, decreased strength, dizziness, increased fascial restrictions, impaired perceived functional ability, increased muscle spasms, impaired flexibility, impaired sensation, impaired UE functional use, improper body mechanics, postural dysfunction, and pain.   ACTIVITY LIMITATIONS: carrying, lifting, bending, squatting, sleeping, transfers, bed mobility, reach over head, and locomotion level  PARTICIPATION LIMITATIONS: meal prep, cleaning, laundry, driving, shopping, community activity, and occupation  PERSONAL FACTORS: Fitness, Past/current experiences, Time since onset of injury/illness/exacerbation, and 3+ comorbidities: Acoustic neuroma - deafness in L ear, ADD, asthma, balance problem (after acoustic neuroma), chronic back pain, GERD, DM, orthostatic hypotension, Hashimoto's thyroiditis  are also affecting patient's functional outcome.   REHAB POTENTIAL: Good  CLINICAL DECISION MAKING: Evolving/moderate complexity  EVALUATION COMPLEXITY: Moderate   GOALS: Goals reviewed with patient? Yes  SHORT TERM GOALS: Target date: 04/10/2023  Patient will be independent with initial HEP to improve outcomes and carryover.  Baseline: TBD Goal status: INITIAL  2.  Patient will report reduced frequency and intensity and/or centralization of radicular symptoms.  Baseline: L UE radicular numbness and tingling into radial side of hand, L>R LE radicular pain and tingling into distal LE Goal status: INITIAL  3.  Complete FGA. Baseline: TBA Goal status:  INITIAL  LONG TERM GOALS: Target date: 05/08/2023  Patient will be independent with ongoing/advanced HEP for self-management at home.  Baseline:  Goal status: INITIAL  2.  Patient will report 50-75% improvement in neck and back pain as well as associated radicular pain/numbness/tingling to improve QOL.  Baseline: L>R neck & upper shoulder up to 5/10 with frequent L UE radicular numbness and  tingling into radial side of hand, LBP up to 8/10 with L>R LE radicular pain and tingling into distal LE Goal status: INITIAL  3.  Patient will demonstrate improved posture to decrease muscle imbalance. Baseline: Severely forward head and rounded shoulder posture with increased thoracic kyphosis and flattening/reversal of lumbar lordosis Goal status: INITIAL  4.  Patient to demonstrate ability to achieve and maintain good spinal alignment and body mechanics needed for daily activities. Baseline:  Goal status: INITIAL  5.  Patient will demonstrate functional pain free cervical ROM for safety with driving.  Baseline: Refer to above cervical ROM table Goal status: INITIAL  6.  Patient will demonstrate functional pain free lumbar ROM to perform ADLs.   Baseline: Refer to above lumbar ROM table Goal status: INITIAL  7.  Patient will demonstrate improved functional strength as demonstrated by overall B proximal UE and LE MMT >/= 4+/5. Baseline: Refer to above UE and LE MMT tables Goal status: INITIAL  8.  Patient will report </= 8% on modified Oswestry to demonstrate improved functional ability.  Baseline: 10 / 50 = 20.0 % Goal status: INITIAL   9.  Patient will report </= 5% on NDI to demonstrate improved functional ability.  Baseline: 9 / 50 = 18.0 % Goal status: INITIAL  10.  Patient to report ability to perform ADLs, household, and work-related tasks without limitation due to neck or back pain, radiculopathy, LOM or weakness. Baseline:  Goal status: INITIAL    PLAN:  PT FREQUENCY: 2x/week  PT DURATION: 8 weeks  PLANNED INTERVENTIONS: Therapeutic exercises, Therapeutic activity, Neuromuscular re-education, Balance training, Gait training, Patient/Family education, Self Care, Joint mobilization, DME instructions, Aquatic Therapy, Dry Needling, Electrical stimulation, Spinal manipulation, Spinal mobilization, Cryotherapy, Moist heat, Taping, Traction, Ultrasound, Manual therapy,  and Re-evaluation  PLAN FOR NEXT SESSION: Review exercises patient currently performing from low back HEP from prior PT episodes and update/modify as indicated providing current HEP handout; create initial HEP for gentle cervical stretching/ROM and postural training; posture and body mechanics education including review of proper desk setup; MT +/- DN to address abnormal muscle tension and pain; FGA assessment   Marry Guan, PT 03/13/2023, 2:23 PM

## 2023-03-08 NOTE — Patient Instructions (Signed)
Will order home sleep study Get labs today

## 2023-03-09 LAB — CMP14+EGFR
ALT: 13 IU/L (ref 0–32)
AST: 16 IU/L (ref 0–40)
Albumin: 4.5 g/dL (ref 3.8–4.9)
Alkaline Phosphatase: 60 IU/L (ref 44–121)
BUN/Creatinine Ratio: 19 (ref 12–28)
BUN: 20 mg/dL (ref 8–27)
Bilirubin Total: 0.9 mg/dL (ref 0.0–1.2)
CO2: 25 mmol/L (ref 20–29)
Calcium: 8.9 mg/dL (ref 8.7–10.3)
Chloride: 99 mmol/L (ref 96–106)
Creatinine, Ser: 1.07 mg/dL — ABNORMAL HIGH (ref 0.57–1.00)
Globulin, Total: 2.4 g/dL (ref 1.5–4.5)
Glucose: 85 mg/dL (ref 70–99)
Potassium: 2.9 mmol/L — ABNORMAL LOW (ref 3.5–5.2)
Sodium: 143 mmol/L (ref 134–144)
Total Protein: 6.9 g/dL (ref 6.0–8.5)
eGFR: 59 mL/min/{1.73_m2} — ABNORMAL LOW (ref >59)

## 2023-03-09 LAB — CBC WITH DIFFERENTIAL/PLATELET
Basophils Absolute: 0 10*3/uL (ref 0.0–0.2)
Basos: 0 %
EOS (ABSOLUTE): 0 10*3/uL (ref 0.0–0.4)
Eos: 1 %
Hematocrit: 40.1 % (ref 34.0–46.6)
Hemoglobin: 13.7 g/dL (ref 11.1–15.9)
Immature Grans (Abs): 0 10*3/uL (ref 0.0–0.1)
Immature Granulocytes: 0 %
Lymphocytes Absolute: 2.4 10*3/uL (ref 0.7–3.1)
Lymphs: 31 %
MCH: 29.8 pg (ref 26.6–33.0)
MCHC: 34.2 g/dL (ref 31.5–35.7)
MCV: 87 fL (ref 79–97)
Monocytes Absolute: 0.6 10*3/uL (ref 0.1–0.9)
Monocytes: 7 %
Neutrophils Absolute: 4.7 10*3/uL (ref 1.4–7.0)
Neutrophils: 61 %
Platelets: 202 10*3/uL (ref 150–450)
RBC: 4.59 x10E6/uL (ref 3.77–5.28)
RDW: 11.6 % — ABNORMAL LOW (ref 11.7–15.4)
WBC: 7.7 10*3/uL (ref 3.4–10.8)

## 2023-03-09 LAB — TSH+FREE T4
Free T4: 1.51 ng/dL (ref 0.82–1.77)
TSH: 7.82 u[IU]/mL — ABNORMAL HIGH (ref 0.450–4.500)

## 2023-03-09 LAB — IRON,TIBC AND FERRITIN PANEL
Ferritin: 25 ng/mL (ref 15–150)
Iron Saturation: 25 % (ref 15–55)
Iron: 78 ug/dL (ref 27–159)
Total Iron Binding Capacity: 313 ug/dL (ref 250–450)
UIBC: 235 ug/dL (ref 131–425)

## 2023-03-09 LAB — VITAMIN D 25 HYDROXY (VIT D DEFICIENCY, FRACTURES): Vit D, 25-Hydroxy: 27.4 ng/mL — ABNORMAL LOW (ref 30.0–100.0)

## 2023-03-09 LAB — B12 AND FOLATE PANEL
Folate: 10.2 ng/mL (ref >3.0)
Vitamin B-12: 421 pg/mL (ref 232–1245)

## 2023-03-11 ENCOUNTER — Other Ambulatory Visit: Payer: Self-pay | Admitting: Physician Assistant

## 2023-03-11 MED ORDER — LEVOTHYROXINE SODIUM 88 MCG PO TABS: 88.0000 ug | ORAL_TABLET | Freq: Every day | ORAL | 1 refills | Status: DC

## 2023-03-11 NOTE — Progress Notes (Signed)
Your potassium is too low. You need to be on a potassium supplement if this low. Need to recheck in 1 week to make sure this low.   Vitamin D low still. Add 2000 units and make sure taking with dairy for better absorption.  TSH is elevated showing that you are trending towards hypothyroid. We could increase your synthroid to see if you feel a little better.  Will send to to pharmacy.  Make sure staying hydrated. Kidney function has dropped a little.   Bmp to order for 1 week.

## 2023-03-13 ENCOUNTER — Other Ambulatory Visit: Payer: Self-pay

## 2023-03-13 ENCOUNTER — Encounter: Payer: Self-pay | Admitting: Physical Therapy

## 2023-03-13 ENCOUNTER — Ambulatory Visit: Payer: BC Managed Care – PPO | Attending: Neurosurgery | Admitting: Physical Therapy

## 2023-03-13 DIAGNOSIS — M6281 Muscle weakness (generalized): Secondary | ICD-10-CM | POA: Insufficient documentation

## 2023-03-13 DIAGNOSIS — G8929 Other chronic pain: Secondary | ICD-10-CM | POA: Insufficient documentation

## 2023-03-13 DIAGNOSIS — M5442 Lumbago with sciatica, left side: Secondary | ICD-10-CM | POA: Diagnosis present

## 2023-03-13 DIAGNOSIS — R293 Abnormal posture: Secondary | ICD-10-CM | POA: Insufficient documentation

## 2023-03-13 DIAGNOSIS — M5412 Radiculopathy, cervical region: Secondary | ICD-10-CM | POA: Diagnosis present

## 2023-03-13 DIAGNOSIS — M62838 Other muscle spasm: Secondary | ICD-10-CM | POA: Diagnosis present

## 2023-03-18 ENCOUNTER — Ambulatory Visit: Payer: BC Managed Care – PPO

## 2023-03-18 ENCOUNTER — Other Ambulatory Visit: Payer: Self-pay | Admitting: Physician Assistant

## 2023-03-18 ENCOUNTER — Telehealth: Payer: Self-pay

## 2023-03-18 DIAGNOSIS — R293 Abnormal posture: Secondary | ICD-10-CM

## 2023-03-18 DIAGNOSIS — M5412 Radiculopathy, cervical region: Secondary | ICD-10-CM

## 2023-03-18 DIAGNOSIS — M6281 Muscle weakness (generalized): Secondary | ICD-10-CM

## 2023-03-18 DIAGNOSIS — E876 Hypokalemia: Secondary | ICD-10-CM

## 2023-03-18 DIAGNOSIS — R944 Abnormal results of kidney function studies: Secondary | ICD-10-CM

## 2023-03-18 DIAGNOSIS — M62838 Other muscle spasm: Secondary | ICD-10-CM

## 2023-03-18 DIAGNOSIS — G8929 Other chronic pain: Secondary | ICD-10-CM

## 2023-03-18 NOTE — Therapy (Signed)
OUTPATIENT PHYSICAL THERAPY CERVICAL AND THORACOLUMBAR TREATMENT   Patient Name: Cynthia [name] MRN: [id number] DOB:24-Feb-1961, 62 y.o., female Today's Date: 03/18/2023   END OF SESSION:  PT End of Session - 03/18/23 7829     Visit Number 2    Date for PT Re-Evaluation 05/08/23    Authorization Type [employer]    PT Start Time 0806    PT Stop Time 0848    PT Time Calculation (min) 42 min    Activity Tolerance Patient tolerated treatment well    Behavior During Therapy [facility] for tasks assessed/performed              Past Medical History:  Diagnosis Date   Acoustic neuroma (HCC)    ADD (attention deficit disorder)    and OCD-on Vyvanse   Asthma    w/out status asthmaticus   Awareness under anesthesia    Balance problem    after acoustic neuroma excision   Chronic back pain    Complication of anesthesia    unrecognized difficult intubation   Constipation, chronic    IBS, CIC   Difficult airway    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   Acustic Neuroma - Brain tumor    GERD (gastroesophageal reflux disease)    History of diabetes mellitus    was prediabetes, off metformin   Iron deficiency anemia, unspecified 02/18/2013   Obesity    296 lbs (highest weight in 2012)    Tendonitis, Achilles, left    Past Surgical History:  Procedure Laterality Date   ABLATION     and bladder sling   ACHILLES TENDON REPAIR     x2   ANAL FISSURE REPAIR  2012   BACK SURGERY     CHOLECYSTECTOMY     COLONOSCOPY WITH PROPOFOL N/A 01/20/2021   Procedure: COLONOSCOPY WITH PROPOFOL;  Surgeon: Jeani Hawking, MD;  Location: [facility];  Service: Endoscopy;  Laterality: N/A;   CRANIOTOMY  09/13/2015   with excision of acoustic neuroma   ESOPHAGOGASTRODUODENOSCOPY (EGD) WITH PROPOFOL N/A 07/22/2020   Procedure: ESOPHAGOGASTRODUODENOSCOPY (EGD) WITH PROPOFOL;  Surgeon: Jeani Hawking, MD;  Location: [facility];  Service: Endoscopy;  Laterality: N/A;   IMPLANTATION BONE ANCHORED HEARING AID     MOUTH SURGERY  2015   3  implants   OVARIAN CYST REMOVAL     x4   THYROID LOBECTOMY Right 04/28/2015   Partial    TOE FUSION Left    Patient Active Problem List   Diagnosis Date Noted   Class 1 obesity due to excess calories without serious comorbidity with body mass index (BMI) of 30.0 to 30.9 in adult 03/08/2023   Non-restorative sleep 03/08/2023   Post-viral cough syndrome 05/30/2022   Strain of lumbar region 02/05/2022   No energy 11/29/2021   History of sleep apnea 11/29/2021   Orthostatic hypotension 08/14/2021   Irritation of left radial nerve 03/16/2021   Right-sided chest wall pain 11/28/2020   History of endometrial ablation 10/28/2020   History of partial thyroidectomy 10/28/2020   Chronic gastritis without bleeding 05/27/2020   Hiatal hernia 05/27/2020   Nausea 05/27/2020   Tremor of right hand 02/26/2020   Hearing loss of right ear 11/24/2019   Basal cell carcinoma of left upper arm 10/21/2019   Atrophic vaginitis 06/03/2019   Family history of osteoporosis 03/05/2019   Post-menopausal 03/05/2019   Motion sickness 12/26/2017   Compression of right radial nerve 06/26/2017   Fatty liver disease, nonalcoholic 03/26/2017   Left renal mass 03/26/2017   Obesity (BMI  30.0-34.9) 03/26/2017   Irritable 10/17/2016   Chronic left-sided low back pain 09/19/2016   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HCC) 12/06/2015   Deafness in left ear 12/06/2015   Hashimoto's thyroiditis 06/14/2015   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   Hypokalemia 02/13/2015   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HCC) 02/11/2015   HSV infection 08/26/2014   ADD (attention deficit disorder) 05/11/2014   Chronic constipation 05/11/2014   Essential (primary) hypertension 05/11/2014   Gastro-esophageal reflux disease without esophagitis 05/11/2014   Asthma, mild persistent 05/11/2014   Interdigital neuralgia 04/14/2013   Iron deficiency anemia, unspecified 02/18/2013    PCP: Jomarie Longs, PA-C   REFERRING PROVIDER: Donalee Citrin, MD    REFERRING DIAG:  M54.40 (ICD-10-CM) - Lumbago with sciatica, unspecified side  M54.12 (ICD-10-CM) - Cervical radiculopathy at C6   THERAPY DIAG:  Radiculopathy, cervical region  Chronic bilateral low back pain with left-sided sciatica  Abnormal posture  Muscle weakness (generalized)  Other muscle spasm  RATIONALE FOR EVALUATION AND TREATMENT: Rehabilitation  ONSET DATE: ~6 months for neck pain, chronic LBP  NEXT MD VISIT: Unknown   SUBJECTIVE:                                                                                                                                                                                                         SUBJECTIVE STATEMENT: Pt reports having numbness going down L UE and LE midl pain today.  PAIN: Are you having pain? Yes: NPRS scale: 3/10 currently, at worst 5/10 Pain location: L>R neck, upper shoulder with intermittent L UE numbness and tingling to radial side of hand Pain description: tingly, occasional sharp, numb Aggravating factors: variable - sometimes with sitting, also when walking in the park Relieving factors: nothing  Are you having pain? Yes: NPRS scale: 3/10 currently, at worst 8/10 Pain location: L>R low back with sciatic pain and tingling into distal L LE Pain description: achy in low back, occasional sharp when when attempting to return to stand after bending over, sharp sciatic pain Aggravating factors: bending over, lifting, walking for sciatic pain Relieving factors: injections, Tiger Balm patches, heat, exercises from prior PT HEP  PERTINENT HISTORY:  Acoustic neuroma - deafness in L ear, ADD, asthma, balance problem (after acoustic neuroma), chronic back pain, GERD, DM, orthostatic hypotension, Hashimoto's thyroiditis  PRECAUTIONS: None  HAND DOMINANCE: Right  RED FLAGS: None  WEIGHT BEARING RESTRICTIONS: No  FALLS:  Has patient fallen in last 6 months? Yes. 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 Advanced degenerative foraminal impingement on the left at C5-6. 2. Moderate degenerative foraminal narrowing on the right at C6-7. 3. Diffusely patent spinal canal.  01/20/23 - MRI Lumbar spine: IMPRESSION: 1. Generalized lumbar spine degeneration greatest at L5-S1. No significant change when compared to 2022. 2. L5-S1 left foraminal impingement. Noncompressive bilateral foraminal narrowing at L2-3. 3. Diffusely patent spinal canal.  PATIENT SURVEYS:  Modified Oswestry 10 / 50 = 20.0 %  NDI 9 / 50 = 18.0 %  SCREENING FOR RED FLAGS: Bowel or bladder incontinence: No Spinal tumors: No Cauda equina syndrome: No Compression fracture: No Abdominal aneurysm: No  COGNITION: Overall cognitive status: Within functional limits for tasks assessed     SENSATION: WFL Intermittent L UE numbness and tingling, L LE sciatica and tingling  MUSCLE LENGTH: Hamstrings: Mild tight B ITB: Mild tight B Piriformis: Mild tight B Hip IR: Mod/severe tight B Hip flexors: Mild/mod tight L>R Quads: WFL  POSTURE:  rounded shoulders, forward head, decreased lumbar lordosis, and increased thoracic kyphosis  PALPATION: TTP with increased muscle tension in L>R cervicothoracic and lumbar paraspinals, upper shoulder and periscapular muscles, as well as  glutes/piriformis  CERVICAL ROM:   Active ROM Eval  Flexion 34 ^  Extension 44 - made tingling go away  Right lateral flexion 30  Left lateral flexion 34  Right rotation 63  Left rotation 51 - triggered tingling    (Blank rows = not tested, ^ - increased pain)  UPPER EXTREMITY ROM:  Active ROM Right eval Left eval  Shoulder flexion 142 124  Shoulder extension 49 42  Shoulder abduction 161 148  Shoulder adduction    Shoulder internal rotation FIR WNL FIR WNL  Shoulder external rotation FER T3 FER T1  6 (Blank rows = not tested)  UPPER EXTREMITY MMT:  MMT Right eval Left eval  Shoulder flexion 4+ 4  Shoulder extension 4+ 4  Shoulder abduction 4+ 4  Shoulder adduction    Shoulder internal rotation 4+ 4-  Shoulder external rotation 4 4-  Middle trapezius 4- 4-  Lower trapezius 3+ 3+  (Blank rows = not tested)  LUMBAR ROM:   Active  Eval  Flexion Hands to mid shins  Extension 25% limited  Right lateral flexion Hand to fibular head  Left lateral flexion Hand to lateral knee  Right rotation WFL  Left rotation WFL    (Blank rows = not tested)  LOWER EXTREMITY ROM:    B LE AROM grossly WFL  LOWER EXTREMITY MMT:    MMT Right eval Left eval  Hip flexion 4 4-  Hip extension 4+ 4+  Hip abduction 4 3+  Hip adduction 4- 4  Hip internal rotation 5 4+  Hip external rotation 4 4-  Knee flexion 5 4+  Knee extension 5 4  Ankle dorsiflexion 5 5  Ankle plantarflexion    Ankle inversion    Ankle  eversion      (Blank rows = not tested)  FUNCTIONAL TESTS:  Functional gait assessment: TBA   TODAY'S TREATMENT:  03/18/23 Therapeutic Exercise: to improve strength and mobility.  Demo, verbal and tactile cues throughout for technique.  UBE L1.0 3 min each way Bent knee fallouts GTB 10x5" single Bridge with hip ABD x 10  Clamshell GTB x 10  Cervical extension pillowcase 10x3" Cervical rotation with pillowcase 10x3" Standing B ER RTB x 10  Standing horizontal ABD RTB x  10  Standing row and shld ext RTB 2x10 each Cat cow reviewed, prone hip extension reviewed  03/13/23 - Eval only   PATIENT EDUCATION:  Education details: HEP update see below  Person educated: Patient Education method: Explanation Education comprehension: verbalized understanding  HOME EXERCISE PROGRAM: Access Code: [email or web address] URL: [email or web address] Date: 03/18/2023 Prepared by: Verta Ellen  Exercises - Cervical Extension AROM with Strap  - 1 x daily - 7 x weekly - 2 sets - 10 reps - Seated Assisted Cervical Rotation with Towel  - 1 x daily - 7 x weekly - 2 sets - 10 reps - Shoulder External Rotation and Scapular Retraction with Resistance  - 1 x daily - 3 x weekly - 2 sets - 10 reps - Standing Shoulder Horizontal Abduction with Resistance  - 1 x daily - 3 x weekly - 2 sets - 10 reps   ASSESSMENT:  CLINICAL IMPRESSION: Pt demonstrated a good response to the progression of exercises. Showed good demonstration of her current HEP for low back. Requires cues with postural exercises especially ER to keep elbows to sides. Cues with prone hip extension to avoid rotating pelvis. Reports of pulling and muscle fatigue during session but no increased pain. [place] will benefit from skilled PT to address above deficits to improve mobility and activity tolerance with decreased pain interference.   OBJECTIVE IMPAIRMENTS: Abnormal gait, decreased activity tolerance, decreased balance, decreased coordination, decreased endurance, decreased knowledge of condition, decreased mobility, difficulty walking, decreased ROM, decreased strength, dizziness, increased fascial restrictions, impaired perceived functional ability, increased muscle spasms, impaired flexibility, impaired sensation, impaired UE functional use, improper body mechanics, postural dysfunction, and pain.   ACTIVITY LIMITATIONS: carrying, lifting, bending, squatting, sleeping, transfers, bed mobility, reach over head, and  locomotion level  PARTICIPATION LIMITATIONS: meal prep, cleaning, laundry, driving, shopping, community activity, and occupation  PERSONAL FACTORS: Fitness, Past/current experiences, Time since onset of injury/illness/exacerbation, and 3+ comorbidities: Acoustic neuroma - deafness in L ear, ADD, asthma, balance problem (after acoustic neuroma), chronic back pain, GERD, DM, orthostatic hypotension, Hashimoto's thyroiditis  are also affecting patient's functional outcome.   REHAB POTENTIAL: Good  CLINICAL DECISION MAKING: Evolving/moderate complexity  EVALUATION COMPLEXITY: Moderate   GOALS: Goals reviewed with patient? Yes  SHORT TERM GOALS: Target date: 04/10/2023  Patient will be independent with initial HEP to improve outcomes and carryover.  Baseline: TBD Goal status: IN PROGRESS  2.  Patient will report reduced frequency and intensity and/or centralization of radicular symptoms.  Baseline: L UE radicular numbness and tingling into radial side of hand, L>R LE radicular pain and tingling into distal LE Goal status: IN PROGRESS  3.  Complete FGA. Baseline: TBA Goal status: IN PROGRESS  LONG TERM GOALS: Target date: 05/08/2023  Patient will be independent with ongoing/advanced HEP for self-management at home.  Baseline:  Goal status: IN PROGRESS  2.  Patient will report 50-75% improvement in neck and back pain as well as associated radicular pain/numbness/tingling to improve QOL.  Baseline: L>R neck &  upper shoulder up to 5/10 with frequent L UE radicular numbness and tingling into radial side of hand, LBP up to 8/10 with L>R LE radicular pain and tingling into distal LE Goal status: IN PROGRESS  3.  Patient will demonstrate improved posture to decrease muscle imbalance. Baseline: Severely forward head and rounded shoulder posture with increased thoracic kyphosis and flattening/reversal of lumbar lordosis Goal status: IN PROGRESS  4.  Patient to demonstrate ability to  achieve and maintain good spinal alignment and body mechanics needed for daily activities. Baseline:  Goal status: IN PROGRESS  5.  Patient will demonstrate functional pain free cervical ROM for safety with driving.  Baseline: Refer to above cervical ROM table Goal status: IN PROGRESS  6.  Patient will demonstrate functional pain free lumbar ROM to perform ADLs.   Baseline: Refer to above lumbar ROM table Goal status: IN PROGRESS  7.  Patient will demonstrate improved functional strength as demonstrated by overall B proximal UE and LE MMT >/= 4+/5. Baseline: Refer to above UE and LE MMT tables Goal status: IN PROGRESS  8.  Patient will report </= 8% on modified Oswestry to demonstrate improved functional ability.  Baseline: 10 / 50 = 20.0 % Goal status: IN PROGRESS   9.  Patient will report </= 5% on NDI to demonstrate improved functional ability.  Baseline: 9 / 50 = 18.0 % Goal status: IN PROGRESS  10.  Patient to report ability to perform ADLs, household, and work-related tasks without limitation due to neck or back pain, radiculopathy, LOM or weakness. Baseline:  Goal status: IN PROGRESS    PLAN:  PT FREQUENCY: 2x/week  PT DURATION: 8 weeks  PLANNED INTERVENTIONS: Therapeutic exercises, Therapeutic activity, Neuromuscular re-education, Balance training, Gait training, Patient/Family education, Self Care, Joint mobilization, DME instructions, Aquatic Therapy, Dry Needling, Electrical stimulation, Spinal manipulation, Spinal mobilization, Cryotherapy, Moist heat, Taping, Traction, Ultrasound, Manual therapy, and Re-evaluation  PLAN FOR NEXT SESSION: Review HEP if needed; gentle cervical stretching/ROM and postural training; posture and body mechanics education including review of proper desk setup; MT +/- DN to address abnormal muscle tension and pain; FGA assessment   Darleene Cleaver, PTA 03/18/2023, 8:53 AM

## 2023-03-18 NOTE — Progress Notes (Signed)
Printed labs for recheck.

## 2023-03-18 NOTE — Telephone Encounter (Signed)
labs

## 2023-03-19 ENCOUNTER — Encounter: Payer: Self-pay | Admitting: Physician Assistant

## 2023-03-19 LAB — BASIC METABOLIC PANEL
BUN/Creatinine Ratio: 16 (ref 12–28)
BUN: 16 mg/dL (ref 8–27)
CO2: 23 mmol/L (ref 20–29)
Calcium: 8.7 mg/dL (ref 8.7–10.3)
Chloride: 102 mmol/L (ref 96–106)
Creatinine, Ser: 1.03 mg/dL — ABNORMAL HIGH (ref 0.57–1.00)
Glucose: 98 mg/dL (ref 70–99)
Potassium: 3.6 mmol/L (ref 3.5–5.2)
Sodium: 141 mmol/L (ref 134–144)
eGFR: 62 mL/min/{1.73_m2} (ref >59)

## 2023-03-19 NOTE — Progress Notes (Signed)
Kidney function improved and potassium in normal range! Looks good.

## 2023-03-21 ENCOUNTER — Telehealth: Payer: Self-pay | Admitting: Physician Assistant

## 2023-03-21 NOTE — Telephone Encounter (Signed)
Pharmacy is calling to do a follow up on  [facility] Naltrexone-Bupropion [facility] [address] [place] [address] [facility] [place] Phone number [phone]

## 2023-03-22 ENCOUNTER — Ambulatory Visit: Payer: BC Managed Care – PPO | Attending: Neurosurgery | Admitting: Physical Therapy

## 2023-03-22 ENCOUNTER — Encounter: Payer: Self-pay | Admitting: Physical Therapy

## 2023-03-22 DIAGNOSIS — R293 Abnormal posture: Secondary | ICD-10-CM | POA: Diagnosis present

## 2023-03-22 DIAGNOSIS — G8929 Other chronic pain: Secondary | ICD-10-CM | POA: Diagnosis present

## 2023-03-22 DIAGNOSIS — M6281 Muscle weakness (generalized): Secondary | ICD-10-CM | POA: Diagnosis present

## 2023-03-22 DIAGNOSIS — M62838 Other muscle spasm: Secondary | ICD-10-CM | POA: Diagnosis present

## 2023-03-22 DIAGNOSIS — M5412 Radiculopathy, cervical region: Secondary | ICD-10-CM | POA: Insufficient documentation

## 2023-03-22 DIAGNOSIS — M5442 Lumbago with sciatica, left side: Secondary | ICD-10-CM | POA: Diagnosis present

## 2023-03-22 NOTE — Therapy (Signed)
OUTPATIENT PHYSICAL THERAPY CERVICAL AND THORACOLUMBAR TREATMENT   Patient Name: Cynthia [name] MRN: [id number] DOB:03/03/1961, 62 y.o., female Today's Date: 03/22/2023   END OF SESSION:  PT End of Session - 03/22/23 0802     Visit Number 3    Date for PT Re-Evaluation 05/08/23    Authorization Type [employer]    PT Start Time 0802    PT Stop Time 0844    PT Time Calculation (min) 42 min    Activity Tolerance Patient tolerated treatment well    Behavior During Therapy [facility] for tasks assessed/performed               Past Medical History:  Diagnosis Date   Acoustic neuroma (HCC)    ADD (attention deficit disorder)    and OCD-on Vyvanse   Asthma    w/out status asthmaticus   Awareness under anesthesia    Balance problem    after acoustic neuroma excision   Chronic back pain    Complication of anesthesia    unrecognized difficult intubation   Constipation, chronic    IBS, CIC   Difficult airway    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   Acustic Neuroma - Brain tumor    GERD (gastroesophageal reflux disease)    History of diabetes mellitus    was prediabetes, off metformin   Iron deficiency anemia, unspecified 02/18/2013   Obesity    296 lbs (highest weight in 2012)    Tendonitis, Achilles, left    Past Surgical History:  Procedure Laterality Date   ABLATION     and bladder sling   ACHILLES TENDON REPAIR     x2   ANAL FISSURE REPAIR  2012   BACK SURGERY     CHOLECYSTECTOMY     COLONOSCOPY WITH PROPOFOL N/A 01/20/2021   Procedure: COLONOSCOPY WITH PROPOFOL;  Surgeon: Jeani Hawking, MD;  Location: [facility];  Service: Endoscopy;  Laterality: N/A;   CRANIOTOMY  09/13/2015   with excision of acoustic neuroma   ESOPHAGOGASTRODUODENOSCOPY (EGD) WITH PROPOFOL N/A 07/22/2020   Procedure: ESOPHAGOGASTRODUODENOSCOPY (EGD) WITH PROPOFOL;  Surgeon: Jeani Hawking, MD;  Location: [facility];  Service: Endoscopy;  Laterality: N/A;   IMPLANTATION BONE ANCHORED HEARING AID     MOUTH SURGERY  2015    3 implants   OVARIAN CYST REMOVAL     x4   THYROID LOBECTOMY Right 04/28/2015   Partial    TOE FUSION Left    Patient Active Problem List   Diagnosis Date Noted   Class 1 obesity due to excess calories without serious comorbidity with body mass index (BMI) of 30.0 to 30.9 in adult 03/08/2023   Non-restorative sleep 03/08/2023   Post-viral cough syndrome 05/30/2022   Strain of lumbar region 02/05/2022   No energy 11/29/2021   History of sleep apnea 11/29/2021   Orthostatic hypotension 08/14/2021   Irritation of left radial nerve 03/16/2021   Right-sided chest wall pain 11/28/2020   History of endometrial ablation 10/28/2020   History of partial thyroidectomy 10/28/2020   Chronic gastritis without bleeding 05/27/2020   Hiatal hernia 05/27/2020   Nausea 05/27/2020   Tremor of right hand 02/26/2020   Hearing loss of right ear 11/24/2019   Basal cell carcinoma of left upper arm 10/21/2019   Atrophic vaginitis 06/03/2019   Family history of osteoporosis 03/05/2019   Post-menopausal 03/05/2019   Motion sickness 12/26/2017   Compression of right radial nerve 06/26/2017   Fatty liver disease, nonalcoholic 03/26/2017   Left renal mass 03/26/2017   Obesity (  BMI 30.0-34.9) 03/26/2017   Irritable 10/17/2016   Chronic left-sided low back pain 09/19/2016   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HCC) 12/06/2015   Deafness in left ear 12/06/2015   Hashimoto's thyroiditis 06/14/2015   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   Hypokalemia 02/13/2015   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HCC) 02/11/2015   HSV infection 08/26/2014   ADD (attention deficit disorder) 05/11/2014   Chronic constipation 05/11/2014   Essential (primary) hypertension 05/11/2014   Gastro-esophageal reflux disease without esophagitis 05/11/2014   Asthma, mild persistent 05/11/2014   Interdigital neuralgia of foot 04/14/2013   Iron deficiency anemia, unspecified 02/18/2013    PCP: Jomarie Longs, PA-C   REFERRING PROVIDER: Donalee Citrin,  MD   REFERRING DIAG:  M54.40 (ICD-10-CM) - Lumbago with sciatica, unspecified side  M54.12 (ICD-10-CM) - Cervical radiculopathy at C6   THERAPY DIAG:  Radiculopathy, cervical region  Chronic bilateral low back pain with left-sided sciatica  Abnormal posture  Muscle weakness (generalized)  Other muscle spasm  RATIONALE FOR EVALUATION AND TREATMENT: Rehabilitation  ONSET DATE: ~6 months for neck pain, chronic LBP  NEXT MD VISIT: Unknown   SUBJECTIVE:                                                                                                                                                                                                         SUBJECTIVE STATEMENT: Pt reports mild L-sided pain and numbness going down L UE and LE.  PAIN: Are you having pain? Yes: NPRS scale: 3/10 Pain location: L>R neck, upper shoulder with intermittent L UE numbness and tingling to radial side of hand Pain description: tingly, occasional sharp, numb Aggravating factors: variable - sometimes with sitting, also when walking in the park Relieving factors: nothing  Are you having pain? Yes: NPRS scale: 3/10 Pain location: L>R low back with sciatic pain and tingling into distal L LE Pain description: achy in low back, occasional sharp when when attempting to return to stand after bending over, sharp sciatic pain Aggravating factors: bending over, lifting, walking for sciatic pain Relieving factors: injections, Tiger Balm patches, heat, exercises from prior PT HEP  PERTINENT HISTORY:  Acoustic neuroma - deafness in L ear, ADD, asthma, balance problem (after acoustic neuroma), chronic back pain, GERD, DM, orthostatic hypotension, Hashimoto's thyroiditis  PRECAUTIONS: None  HAND DOMINANCE: Right  RED FLAGS: None  WEIGHT BEARING RESTRICTIONS: No  FALLS:  Has patient fallen in last 6 months? Yes. 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 Advanced degenerative foraminal impingement on the left at C5-6. 2. Moderate degenerative foraminal narrowing on the right at C6-7. 3. Diffusely patent spinal canal.  01/20/23 - MRI Lumbar spine: IMPRESSION: 1. Generalized lumbar spine degeneration greatest at L5-S1. No significant change when compared to 2022. 2. L5-S1 left foraminal impingement. Noncompressive bilateral foraminal narrowing at L2-3. 3. Diffusely patent spinal canal.  PATIENT SURVEYS:  Modified Oswestry 10 / 50 = 20.0 %  NDI 9 / 50 = 18.0 %  SCREENING FOR RED FLAGS: Bowel or bladder incontinence: No Spinal tumors: No Cauda equina syndrome: No Compression fracture: No Abdominal aneurysm: No  COGNITION: Overall cognitive status: Within functional limits for tasks assessed     SENSATION: WFL Intermittent L UE numbness and tingling, L LE sciatica and tingling  MUSCLE LENGTH: Hamstrings: Mild tight B ITB: Mild tight B Piriformis: Mild tight B Hip IR: Mod/severe tight B Hip flexors: Mild/mod tight L>R Quads: WFL  POSTURE:  rounded shoulders, forward head, decreased lumbar lordosis, and increased thoracic kyphosis  PALPATION: TTP with increased muscle tension in L>R cervicothoracic and lumbar paraspinals, upper shoulder and periscapular muscles, as well as glutes/piriformis  CERVICAL ROM:    Active ROM Eval  Flexion 34 ^  Extension 44 - made tingling go away  Right lateral flexion 30  Left lateral flexion 34  Right rotation 63  Left rotation 51 - triggered tingling    (Blank rows = not tested, ^ - increased pain)  UPPER EXTREMITY ROM:  Active ROM Right eval Left eval  Shoulder flexion 142 124  Shoulder extension 49 42  Shoulder abduction 161 148  Shoulder adduction    Shoulder internal rotation FIR WNL FIR WNL  Shoulder external rotation FER T3 FER T1  6 (Blank rows = not tested)  UPPER EXTREMITY MMT:  MMT Right eval Left eval  Shoulder flexion 4+ 4  Shoulder extension 4+ 4  Shoulder abduction 4+ 4  Shoulder adduction    Shoulder internal rotation 4+ 4-  Shoulder external rotation 4 4-  Middle trapezius 4- 4-  Lower trapezius 3+ 3+  (Blank rows = not tested)  LUMBAR ROM:   Active  Eval  Flexion Hands to mid shins  Extension 25% limited  Right lateral flexion Hand to fibular head  Left lateral flexion Hand to lateral knee  Right rotation WFL  Left rotation WFL    (Blank rows = not tested)  LOWER EXTREMITY ROM:    B LE AROM grossly WFL  LOWER EXTREMITY MMT:    MMT Right eval Left eval  Hip flexion 4 4-  Hip extension 4+ 4+  Hip abduction 4 3+  Hip adduction 4- 4  Hip internal rotation 5 4+  Hip external rotation 4 4-  Knee flexion 5 4+  Knee extension 5 4  Ankle dorsiflexion 5 5  Ankle plantarflexion    Ankle inversion    Ankle eversion      (  Blank rows = not tested)  FUNCTIONAL TESTS:  Functional gait assessment: 21/30, 19-24 = medium risk fall (03/22/23)   TODAY'S TREATMENT:   03/22/23 THERAPEUTIC EXERCISE: to improve flexibility, strength and mobility.  Demonstration, verbal and tactile cues throughout for technique.  UBE - L1.5 x 6 min (3' each fwd & back) L hooklying figure-4 piriformis stretch x 30" L hooklying KTOS piriformis stretch x 30"  THERAPEUTIC ACTIVITIES: FGA = 21/30  MANUAL THERAPY: To promote normalized  muscle tension, improved flexibility, and reduced pain. Skilled palpation and monitoring of soft tissue during DN Trigger Point Dry-Needling  Treatment instructions: Expect mild to moderate muscle soreness. S/S of pneumothorax if dry needled over a lung field, and to seek immediate medical attention should they occur. Patient verbalized understanding of these instructions and education. Patient Consent Given: Yes Education handout provided: Yes Muscles treated: L glute medius and minimus Electrical stimulation performed: No Parameters: N/A Treatment response/outcome: Twitch Response Elicited and Palpable Increase in Muscle Length STM/DTM, manual TPR and pin & stretch to muscles addressed with DN   03/18/23 Therapeutic Exercise: to improve strength and mobility.  Demo, verbal and tactile cues throughout for technique.  UBE L1.0 3 min each way Bent knee fallouts GTB 10x5" single Bridge with hip ABD x 10  Clamshell GTB x 10  Cervical extension pillowcase 10x3" Cervical rotation with pillowcase 10x3" Standing B ER RTB x 10  Standing horizontal ABD RTB x 10  Standing row and shld ext RTB 2x10 each Cat cow reviewed, prone hip extension reviewed   03/13/23 - Eval only   PATIENT EDUCATION:  Education details: HEP update - glute/piriformis stretches, role of DN, and DN rational, procedure, outcomes, potential side effects, and recommended post-treatment exercises/activity   Person educated: Patient Education method: Explanation, Demonstration, Verbal cues, Handouts, and MedBridgeGO app updated Education comprehension: verbalized understanding  HOME EXERCISE PROGRAM: *Access Code: [email or web address] URL: [email or web address] Date: 03/22/2023 Prepared by: Glenetta Hew  Exercises - Hooklying Single Leg Bent Knee Fallouts with Resistance  - 1 x daily - 7 x weekly - 2 sets - 10 reps - 3 sec hold - Bridge with Hip Abduction and Resistance  - 1 x daily - 7 x weekly - 2 sets - 10 reps -  5 sec hold - Clam with Resistance  - 1 x daily - 7 x weekly - 2 sets - 10 reps - 3-5 sec hold - Cat Cow  - 2 x daily - 7 x weekly - 2 sets - 10 reps - 5 sec hold - Quadruped Alternating Leg Extensions  - 1 x daily - 7 x weekly - 2 sets - 10 reps - 3 sec hold - Cervical Extension AROM with Strap  - 1 x daily - 7 x weekly - 2 sets - 10 reps - Seated Assisted Cervical Rotation with Towel  - 1 x daily - 7 x weekly - 2 sets - 10 reps - Shoulder External Rotation and Scapular Retraction with Resistance  - 1 x daily - 3 x weekly - 2 sets - 10 reps - Standing Shoulder Horizontal Abduction with Resistance  - 1 x daily - 3 x weekly - 2 sets - 10 reps - Supine Figure 4 Piriformis Stretch (Mirrored)  - 2 x daily - 7 x weekly - 3 reps - 30 sec hold - Supine Piriformis Stretch with Foot on Ground (Mirrored)  - 2 x daily - 7 x weekly - 3 reps - 30 sec hold  Patient Education - Insurance risk surveyor  Dry Needling  *Patient using MedBridgeGO app   ASSESSMENT:  CLINICAL IMPRESSION: Completed FGA with score of 21/30 indicating a medium risk for falls, therefore LTG added to address this deficit and will plan to include balance activities within POC.  Josilyn reports continued left UE and LE radiculopathy today with significant tender TPs identified in L upper glutes which appeared amenable to dry needling.  After explanation of DN rational, procedures, outcomes and potential side effects, patient verbalized consent to DN treatment in conjunction with manual STM/DTM and TPR to reduce ttp/muscle tension. Muscles treated as indicated above. DN produced normal response with good twitches elicited resulting in palpable reduction in pain/ttp and muscle tension. Pt educated to expect mild to moderate muscle soreness for up to 24-48 hrs and instructed to continue prescribed HEP and current activity level with pt verbalizing understanding of these instructions.  MT followed by instruction in stretching for muscles addressed with DN,  with instructions added to HEP.  Also included instructions for her existing low back exercises so that all exercises are eval MedBridgeGO app.  Patient inquiring about instructions for some of the exercises performed last visit that were not included in the app, and explained that not all exercises performed during therapy sessions are expected to be duplicated at home.  Ronell will benefit from continued skilled PT to address ongoing deficits to improve mobility and activity tolerance with decreased pain interference.   OBJECTIVE IMPAIRMENTS: Abnormal gait, decreased activity tolerance, decreased balance, decreased coordination, decreased endurance, decreased knowledge of condition, decreased mobility, difficulty walking, decreased ROM, decreased strength, dizziness, increased fascial restrictions, impaired perceived functional ability, increased muscle spasms, impaired flexibility, impaired sensation, impaired UE functional use, improper body mechanics, postural dysfunction, and pain.   ACTIVITY LIMITATIONS: carrying, lifting, bending, squatting, sleeping, transfers, bed mobility, reach over head, and locomotion level  PARTICIPATION LIMITATIONS: meal prep, cleaning, laundry, driving, shopping, community activity, and occupation  PERSONAL FACTORS: Fitness, Past/current experiences, Time since onset of injury/illness/exacerbation, and 3+ comorbidities: Acoustic neuroma - deafness in L ear, ADD, asthma, balance problem (after acoustic neuroma), chronic back pain, GERD, DM, orthostatic hypotension, Hashimoto's thyroiditis  are also affecting patient's functional outcome.   REHAB POTENTIAL: Good  CLINICAL DECISION MAKING: Evolving/moderate complexity  EVALUATION COMPLEXITY: Moderate   GOALS: Goals reviewed with patient? Yes  SHORT TERM GOALS: Target date: 04/10/2023  Patient will be independent with initial HEP to improve outcomes and carryover.  Baseline: Initial HEP provided 03/18/2023 Goal  status: IN PROGRESS  2.  Patient will report reduced frequency and intensity and/or centralization of radicular symptoms.  Baseline: L UE radicular numbness and tingling into radial side of hand, L>R LE radicular pain and tingling into distal LE Goal status: IN PROGRESS  3.  Complete FGA. Baseline: TBA Goal status: MET  03/22/23  LONG TERM GOALS: Target date: 05/08/2023  Patient will be independent with ongoing/advanced HEP for self-management at home.  Baseline:  Goal status: IN PROGRESS  2.  Patient will report 50-75% improvement in neck and back pain as well as associated radicular pain/numbness/tingling to improve QOL.  Baseline: L>R neck & upper shoulder up to 5/10 with frequent L UE radicular numbness and tingling into radial side of hand, LBP up to 8/10 with L>R LE radicular pain and tingling into distal LE Goal status: IN PROGRESS  3.  Patient will demonstrate improved posture to decrease muscle imbalance. Baseline: Severely forward head and rounded shoulder posture with increased thoracic kyphosis and flattening/reversal of lumbar lordosis Goal status: IN PROGRESS  4.  Patient to demonstrate ability to achieve and maintain good spinal alignment and body mechanics needed for daily activities. Baseline:  Goal status: IN PROGRESS  5.  Patient will demonstrate functional pain free cervical ROM for safety with driving.  Baseline: Refer to above cervical ROM table Goal status: IN PROGRESS  6.  Patient will demonstrate functional pain free lumbar ROM to perform ADLs.   Baseline: Refer to above lumbar ROM table Goal status: IN PROGRESS  7.  Patient will demonstrate improved functional strength as demonstrated by overall B proximal UE and LE MMT >/= 4+/5. Baseline: Refer to above UE and LE MMT tables Goal status: IN PROGRESS  8.  Patient will report </= 8% on modified Oswestry to demonstrate improved functional ability.  Baseline: 10 / 50 = 20.0 % Goal status: IN PROGRESS    9.  Patient will report </= 5% on NDI to demonstrate improved functional ability.  Baseline: 9 / 50 = 18.0 % Goal status: IN PROGRESS  10.  Patient to report ability to perform ADLs, household, and work-related tasks without limitation due to neck or back pain, radiculopathy, LOM or weakness. Baseline:  Goal status: IN PROGRESS   11.  Patient will improve FGA score to >/= 25/30 to improve gait stability and reduce risk for falls. Baseline: 21/30 Goal status: INITIAL    PLAN:  PT FREQUENCY: 2x/week  PT DURATION: 8 weeks  PLANNED INTERVENTIONS: Therapeutic exercises, Therapeutic activity, Neuromuscular re-education, Balance training, Gait training, Patient/Family education, Self Care, Joint mobilization, DME instructions, Aquatic Therapy, Dry Needling, Electrical stimulation, Spinal manipulation, Spinal mobilization, Cryotherapy, Moist heat, Taping, Traction, Ultrasound, Manual therapy, and Re-evaluation  PLAN FOR NEXT SESSION: Assess response to DN; review HEP if needed; gentle cervical stretching/ROM and postural training; posture and body mechanics education including review of proper desk setup; MT +/- DN to address abnormal muscle tension and pain  Marry Guan, PT 03/22/2023, 9:21 AM

## 2023-03-22 NOTE — Telephone Encounter (Signed)
FYI

## 2023-03-26 ENCOUNTER — Encounter: Payer: Self-pay | Admitting: Physical Therapy

## 2023-03-26 ENCOUNTER — Telehealth: Payer: Self-pay | Admitting: Physician Assistant

## 2023-03-26 ENCOUNTER — Ambulatory Visit: Payer: BC Managed Care – PPO | Admitting: Physical Therapy

## 2023-03-26 DIAGNOSIS — M6281 Muscle weakness (generalized): Secondary | ICD-10-CM

## 2023-03-26 DIAGNOSIS — M62838 Other muscle spasm: Secondary | ICD-10-CM

## 2023-03-26 DIAGNOSIS — M5412 Radiculopathy, cervical region: Secondary | ICD-10-CM

## 2023-03-26 DIAGNOSIS — G8929 Other chronic pain: Secondary | ICD-10-CM

## 2023-03-26 DIAGNOSIS — R293 Abnormal posture: Secondary | ICD-10-CM

## 2023-03-26 NOTE — Therapy (Signed)
OUTPATIENT PHYSICAL THERAPY TREATMENT   Patient Name: Cynthia [name] MRN: [id number] DOB:05-26-1961, 62 y.o., female Today's Date: 03/26/2023   END OF SESSION:  PT End of Session - 03/26/23 0846     Visit Number 4    Date for PT Re-Evaluation 05/08/23    Authorization Type [employer]    PT Start Time [phone]    PT Stop Time 0930    PT Time Calculation (min) 44 min    Activity Tolerance Patient tolerated treatment well    Behavior During Therapy [facility] for tasks assessed/performed                Past Medical History:  Diagnosis Date   Acoustic neuroma (HCC)    ADD (attention deficit disorder)    and OCD-on Vyvanse   Asthma    w/out status asthmaticus   Awareness under anesthesia    Balance problem    after acoustic neuroma excision   Chronic back pain    Complication of anesthesia    unrecognized difficult intubation   Constipation, chronic    IBS, CIC   Difficult airway    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   Acustic Neuroma - Brain tumor    GERD (gastroesophageal reflux disease)    History of diabetes mellitus    was prediabetes, off metformin   Iron deficiency anemia, unspecified 02/18/2013   Obesity    296 lbs (highest weight in 2012)    Tendonitis, Achilles, left    Past Surgical History:  Procedure Laterality Date   ABLATION     and bladder sling   ACHILLES TENDON REPAIR     x2   ANAL FISSURE REPAIR  2012   BACK SURGERY     CHOLECYSTECTOMY     COLONOSCOPY WITH PROPOFOL N/A 01/20/2021   Procedure: COLONOSCOPY WITH PROPOFOL;  Surgeon: Jeani Hawking, MD;  Location: [facility];  Service: Endoscopy;  Laterality: N/A;   CRANIOTOMY  09/13/2015   with excision of acoustic neuroma   ESOPHAGOGASTRODUODENOSCOPY (EGD) WITH PROPOFOL N/A 07/22/2020   Procedure: ESOPHAGOGASTRODUODENOSCOPY (EGD) WITH PROPOFOL;  Surgeon: Jeani Hawking, MD;  Location: [facility];  Service: Endoscopy;  Laterality: N/A;   IMPLANTATION BONE ANCHORED HEARING AID     MOUTH SURGERY  2015   3 implants   OVARIAN  CYST REMOVAL     x4   THYROID LOBECTOMY Right 04/28/2015   Partial    TOE FUSION Left    Patient Active Problem List   Diagnosis Date Noted   Class 1 obesity due to excess calories without serious comorbidity with body mass index (BMI) of 30.0 to 30.9 in adult 03/08/2023   Non-restorative sleep 03/08/2023   Post-viral cough syndrome 05/30/2022   Strain of lumbar region 02/05/2022   No energy 11/29/2021   History of sleep apnea 11/29/2021   Orthostatic hypotension 08/14/2021   Irritation of left radial nerve 03/16/2021   Right-sided chest wall pain 11/28/2020   History of endometrial ablation 10/28/2020   History of partial thyroidectomy 10/28/2020   Chronic gastritis without bleeding 05/27/2020   Hiatal hernia 05/27/2020   Nausea 05/27/2020   Tremor of right hand 02/26/2020   Hearing loss of right ear 11/24/2019   Basal cell carcinoma of left upper arm 10/21/2019   Atrophic vaginitis 06/03/2019   Family history of osteoporosis 03/05/2019   Post-menopausal 03/05/2019   Motion sickness 12/26/2017   Compression of right radial nerve 06/26/2017   Fatty liver disease, nonalcoholic 03/26/2017   Left renal mass 03/26/2017   Obesity (BMI 30.0-34.9)  03/26/2017   Irritable 10/17/2016   Chronic left-sided low back pain 09/19/2016   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HCC) 12/06/2015   Deafness in left ear 12/06/2015   Hashimoto's thyroiditis 06/14/2015   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   Hypokalemia 02/13/2015   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HCC) 02/11/2015   HSV infection 08/26/2014   ADD (attention deficit disorder) 05/11/2014   Chronic constipation 05/11/2014   Essential (primary) hypertension 05/11/2014   Gastro-esophageal reflux disease without esophagitis 05/11/2014   Asthma, mild persistent 05/11/2014   Interdigital neuralgia of foot 04/14/2013   Iron deficiency anemia, unspecified 02/18/2013    PCP: Jomarie Longs, PA-C   REFERRING PROVIDER: Donalee Citrin, MD   REFERRING  DIAG:  M54.40 (ICD-10-CM) - Lumbago with sciatica, unspecified side  M54.12 (ICD-10-CM) - Cervical radiculopathy at C6   THERAPY DIAG:  Radiculopathy, cervical region  Chronic bilateral low back pain with left-sided sciatica  Abnormal posture  Muscle weakness (generalized)  Other muscle spasm  RATIONALE FOR EVALUATION AND TREATMENT: Rehabilitation  ONSET DATE: ~6 months for neck pain, chronic LBP  NEXT MD VISIT: Unknown   SUBJECTIVE:                                                                                                                                                                                                         SUBJECTIVE STATEMENT: Pt reports 2-3 days of relief from the LE radiculopathy following the DN.  PAIN: Are you having pain? Yes: NPRS scale: 2/10 Pain location: L>R neck, upper shoulder with intermittent L UE numbness and tingling to radial side of hand Pain description: tingly, occasional sharp, numb Aggravating factors: variable - sometimes with sitting, also when walking in the park Relieving factors: nothing  Are you having pain? Yes: NPRS scale: 4/10 Pain location: L>R low back with sciatic pain and tingling into distal L LE Pain description: achy in low back, occasional sharp when when attempting to return to stand after bending over, sharp sciatic pain Aggravating factors: bending over, lifting, walking for sciatic pain Relieving factors: injections, Tiger Balm patches, heat, exercises from prior PT HEP  PERTINENT HISTORY:  Acoustic neuroma - deafness in L ear, ADD, asthma, balance problem (after acoustic neuroma), chronic back pain, GERD, DM, orthostatic hypotension, Hashimoto's thyroiditis  PRECAUTIONS: None  HAND DOMINANCE: Right  RED FLAGS: None  WEIGHT BEARING RESTRICTIONS: No  FALLS:  Has patient fallen in last 6 months? Yes. 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 Advanced degenerative foraminal impingement on the left at C5-6. 2. Moderate degenerative foraminal narrowing on the right at C6-7. 3. Diffusely patent spinal canal.  01/20/23 - MRI Lumbar spine: IMPRESSION: 1. Generalized lumbar spine degeneration greatest at L5-S1. No significant change when compared to 2022. 2. L5-S1 left foraminal impingement. Noncompressive bilateral foraminal narrowing at L2-3. 3. Diffusely patent spinal canal.  PATIENT SURVEYS:  Modified Oswestry 10 / 50 = 20.0 %  NDI 9 / 50 = 18.0 %  SCREENING FOR RED FLAGS: Bowel or bladder incontinence: No Spinal tumors: No Cauda equina syndrome: No Compression fracture: No Abdominal aneurysm: No  COGNITION: Overall cognitive status: Within functional limits for tasks assessed     SENSATION: WFL Intermittent L UE numbness and tingling, L LE sciatica and tingling  MUSCLE LENGTH: Hamstrings: Mild tight B ITB: Mild tight B Piriformis: Mild tight B Hip IR: Mod/severe tight B Hip flexors: Mild/mod tight L>R Quads: WFL  POSTURE:  rounded shoulders, forward head, decreased lumbar lordosis, and increased thoracic kyphosis  PALPATION: TTP with increased muscle tension in L>R cervicothoracic and lumbar paraspinals, upper shoulder and periscapular muscles, as well as glutes/piriformis  CERVICAL ROM:   Active ROM Eval   Flexion 34 ^  Extension 44 - made tingling go away  Right lateral flexion 30  Left lateral flexion 34  Right rotation 63  Left rotation 51 - triggered tingling    (Blank rows = not tested, ^ - increased pain)  UPPER EXTREMITY ROM:  Active ROM Right eval Left eval  Shoulder flexion 142 124  Shoulder extension 49 42  Shoulder abduction 161 148  Shoulder adduction    Shoulder internal rotation FIR WNL FIR WNL  Shoulder external rotation FER T3 FER T1  6 (Blank rows = not tested)  UPPER EXTREMITY MMT:  MMT Right eval Left eval  Shoulder flexion 4+ 4  Shoulder extension 4+ 4  Shoulder abduction 4+ 4  Shoulder adduction    Shoulder internal rotation 4+ 4-  Shoulder external rotation 4 4-  Middle trapezius 4- 4-  Lower trapezius 3+ 3+  (Blank rows = not tested)  LUMBAR ROM:   Active  Eval  Flexion Hands to mid shins  Extension 25% limited  Right lateral flexion Hand to fibular head  Left lateral flexion Hand to lateral knee  Right rotation WFL  Left rotation WFL    (Blank rows = not tested)  LOWER EXTREMITY ROM:    B LE AROM grossly WFL  LOWER EXTREMITY MMT:    MMT Right eval Left eval  Hip flexion 4 4-  Hip extension 4+ 4+  Hip abduction 4 3+  Hip adduction 4- 4  Hip internal rotation 5 4+  Hip external rotation 4 4-  Knee flexion 5 4+  Knee extension 5 4  Ankle dorsiflexion 5 5  Ankle plantarflexion    Ankle inversion    Ankle eversion      (Blank  rows = not tested)  FUNCTIONAL TESTS:  Functional gait assessment: 21/30, 19-24 = medium risk fall (03/22/23)   TODAY'S TREATMENT:   03/26/23 THERAPEUTIC EXERCISE: to improve flexibility, strength and mobility.  Demonstration, verbal and tactile cues throughout for technique.  UBE - L2.5 x 6 min (3' each fwd & back) L hooklying figure-4 piriformis stretch 2 x 30" L hooklying KTOS piriformis stretch 2 x 30" Bridge + GTB alt hip ABD/ER x 10 S/L L GTB clam 10 x 3" STS + GTB hip ABD isometric x 10  MANUAL  THERAPY: To promote normalized muscle tension, improved flexibility, and reduced pain. Skilled palpation and monitoring of soft tissue during DN Trigger Point Dry-Needling  Treatment instructions: Expect mild to moderate muscle soreness. S/S of pneumothorax if dry needled over a lung field, and to seek immediate medical attention should they occur. Patient verbalized understanding of these instructions and education. Patient Consent Given: Yes Education handout provided: Previously provided Muscles treated: L glute medius & minimus, piriformis; L UT and subscapularis Electrical stimulation performed: No Parameters: N/A Treatment response/outcome: Twitch Response Elicited and Palpable Increase in Muscle Length STM/DTM, manual TPR and pin & stretch to muscles addressed with DN   03/22/23 THERAPEUTIC EXERCISE: to improve flexibility, strength and mobility.  Demonstration, verbal and tactile cues throughout for technique.  UBE - L1.5 x 6 min (3' each fwd & back) L hooklying figure-4 piriformis stretch x 30" L hooklying KTOS piriformis stretch x 30"  THERAPEUTIC ACTIVITIES: FGA = 21/30  MANUAL THERAPY: To promote normalized muscle tension, improved flexibility, and reduced pain. Skilled palpation and monitoring of soft tissue during DN Trigger Point Dry-Needling  Treatment instructions: Expect mild to moderate muscle soreness. S/S of pneumothorax if dry needled over a lung field, and to seek immediate medical attention should they occur. Patient verbalized understanding of these instructions and education. Patient Consent Given: Yes Education handout provided: Yes Muscles treated: L glute medius and minimus Electrical stimulation performed: No Parameters: N/A Treatment response/outcome: Twitch Response Elicited and Palpable Increase in Muscle Length STM/DTM, manual TPR and pin & stretch to muscles addressed with DN   03/18/23 Therapeutic Exercise: to improve strength and mobility.  Demo,  verbal and tactile cues throughout for technique.  UBE L1.0 3 min each way Bent knee fallouts GTB 10x5" single Bridge with hip ABD x 10  Clamshell GTB x 10  Cervical extension pillowcase 10x3" Cervical rotation with pillowcase 10x3" Standing B ER RTB x 10  Standing horizontal ABD RTB x 10  Standing row and shld ext RTB 2x10 each Cat cow reviewed, prone hip extension reviewed   03/13/23 - Eval only   PATIENT EDUCATION:  Education details: HEP update - glute/piriformis stretches, role of DN, and DN rational, procedure, outcomes, potential side effects, and recommended post-treatment exercises/activity   Person educated: Patient Education method: Explanation, Demonstration, Verbal cues, Handouts, and MedBridgeGO app updated Education comprehension: verbalized understanding  HOME EXERCISE PROGRAM: *Access Code: [email or web address] URL: [email or web address] Date: 03/22/2023 Prepared by: Glenetta Hew  Exercises - Hooklying Single Leg Bent Knee Fallouts with Resistance  - 1 x daily - 7 x weekly - 2 sets - 10 reps - 3 sec hold - Bridge with Hip Abduction and Resistance  - 1 x daily - 7 x weekly - 2 sets - 10 reps - 5 sec hold - Clam with Resistance  - 1 x daily - 7 x weekly - 2 sets - 10 reps - 3-5 sec hold - Cat Cow  - 2 x daily -  7 x weekly - 2 sets - 10 reps - 5 sec hold - Quadruped Alternating Leg Extensions  - 1 x daily - 7 x weekly - 2 sets - 10 reps - 3 sec hold - Cervical Extension AROM with Strap  - 1 x daily - 7 x weekly - 2 sets - 10 reps - Seated Assisted Cervical Rotation with Towel  - 1 x daily - 7 x weekly - 2 sets - 10 reps - Shoulder External Rotation and Scapular Retraction with Resistance  - 1 x daily - 3 x weekly - 2 sets - 10 reps - Standing Shoulder Horizontal Abduction with Resistance  - 1 x daily - 3 x weekly - 2 sets - 10 reps - Supine Figure 4 Piriformis Stretch (Mirrored)  - 2 x daily - 7 x weekly - 3 reps - 30 sec hold - Supine Piriformis Stretch with  Foot on Ground (Mirrored)  - 2 x daily - 7 x weekly - 3 reps - 30 sec hold  Patient Education - Trigger Point Dry Needling  *Patient using MedBridgeGO app   ASSESSMENT:  CLINICAL IMPRESSION: Larri reports the DN gave her a few days of relief from the LE radiculopathy but notes the pain is back this morning after not doing her usual walking yesterday.  She reports the initial HEP is going well and denies need for further review at this time.  Further tender TPs identified today in L glutes and piriformis which appeared amendable to DN. Performed MT incorporating DN to address the TPs with strong twitch responses elicited resulting in palpable reduction in muscle tension and TTP. MT followed by stretching and strengthening to further normalize muscle tension.  Pt noting assymetry during bridge + GTB hip abduction clam, therefore modified this exercise to perform clam motion alternating unilaterally while in bridge position.  Pt requesting to shift focus to her neck/shoulder at the end of the visit. TPs identified in in L UT and periscapular muscle which were also addressed with MT and DN, again eliciting good twitch responses.  Reminded pt to continue prescribed HEP and current activity level to further promote normalization of muscle tension.  Mariame will benefit from continued skilled PT to address ongoing deficits to improve mobility and activity tolerance with decreased pain interference.   OBJECTIVE IMPAIRMENTS: Abnormal gait, decreased activity tolerance, decreased balance, decreased coordination, decreased endurance, decreased knowledge of condition, decreased mobility, difficulty walking, decreased ROM, decreased strength, dizziness, increased fascial restrictions, impaired perceived functional ability, increased muscle spasms, impaired flexibility, impaired sensation, impaired UE functional use, improper body mechanics, postural dysfunction, and pain.   ACTIVITY LIMITATIONS: carrying, lifting,  bending, squatting, sleeping, transfers, bed mobility, reach over head, and locomotion level  PARTICIPATION LIMITATIONS: meal prep, cleaning, laundry, driving, shopping, community activity, and occupation  PERSONAL FACTORS: Fitness, Past/current experiences, Time since onset of injury/illness/exacerbation, and 3+ comorbidities: Acoustic neuroma - deafness in L ear, ADD, asthma, balance problem (after acoustic neuroma), chronic back pain, GERD, DM, orthostatic hypotension, Hashimoto's thyroiditis  are also affecting patient's functional outcome.   REHAB POTENTIAL: Good  CLINICAL DECISION MAKING: Evolving/moderate complexity  EVALUATION COMPLEXITY: Moderate   GOALS: Goals reviewed with patient? Yes  SHORT TERM GOALS: Target date: 04/10/2023  Patient will be independent with initial HEP to improve outcomes and carryover.  Baseline: Initial HEP provided 03/18/2023 Goal status: MET - 03/26/23   2.  Patient will report reduced frequency and intensity and/or centralization of radicular symptoms.  Baseline: L UE radicular numbness and tingling  into radial side of hand, L>R LE radicular pain and tingling into distal LE Goal status: IN PROGRESS  3.  Complete FGA. Baseline: TBA Goal status: MET  03/22/23  LONG TERM GOALS: Target date: 05/08/2023  Patient will be independent with ongoing/advanced HEP for self-management at home.  Baseline:  Goal status: IN PROGRESS  2.  Patient will report 50-75% improvement in neck and back pain as well as associated radicular pain/numbness/tingling to improve QOL.  Baseline: L>R neck & upper shoulder up to 5/10 with frequent L UE radicular numbness and tingling into radial side of hand, LBP up to 8/10 with L>R LE radicular pain and tingling into distal LE Goal status: IN PROGRESS  3.  Patient will demonstrate improved posture to decrease muscle imbalance. Baseline: Severely forward head and rounded shoulder posture with increased thoracic kyphosis and  flattening/reversal of lumbar lordosis Goal status: IN PROGRESS  4.  Patient to demonstrate ability to achieve and maintain good spinal alignment and body mechanics needed for daily activities. Baseline:  Goal status: IN PROGRESS  5.  Patient will demonstrate functional pain free cervical ROM for safety with driving.  Baseline: Refer to above cervical ROM table Goal status: IN PROGRESS  6.  Patient will demonstrate functional pain free lumbar ROM to perform ADLs.   Baseline: Refer to above lumbar ROM table Goal status: IN PROGRESS  7.  Patient will demonstrate improved functional strength as demonstrated by overall B proximal UE and LE MMT >/= 4+/5. Baseline: Refer to above UE and LE MMT tables Goal status: IN PROGRESS  8.  Patient will report </= 8% on modified Oswestry to demonstrate improved functional ability.  Baseline: 10 / 50 = 20.0 % Goal status: IN PROGRESS   9.  Patient will report </= 5% on NDI to demonstrate improved functional ability.  Baseline: 9 / 50 = 18.0 % Goal status: IN PROGRESS  10.  Patient to report ability to perform ADLs, household, and work-related tasks without limitation due to neck or back pain, radiculopathy, LOM or weakness. Baseline:  Goal status: IN PROGRESS   11.  Patient will improve FGA score to >/= 25/30 to improve gait stability and reduce risk for falls. Baseline: 21/30 Goal status: IN PROGRESS    PLAN:  PT FREQUENCY: 2x/week  PT DURATION: 8 weeks  PLANNED INTERVENTIONS: Therapeutic exercises, Therapeutic activity, Neuromuscular re-education, Balance training, Gait training, Patient/Family education, Self Care, Joint mobilization, DME instructions, Aquatic Therapy, Dry Needling, Electrical stimulation, Spinal manipulation, Spinal mobilization, Cryotherapy, Moist heat, Taping, Traction, Ultrasound, Manual therapy, and Re-evaluation  PLAN FOR NEXT SESSION:  Assess response to DN; review HEP if needed; gentle cervical stretching/ROM and  postural training; posture and body mechanics education including review of proper desk setup; MT +/- DN to address abnormal muscle tension and pain  Marry Guan, PT 03/26/2023, 9:40 AM

## 2023-03-26 NOTE — Telephone Encounter (Signed)
Sleep study orders, clinical notes, demographics, and copies of insurance cards have been faxed to [facility] at [phone]. Office will contact patient to complete online registration in order to ship home sleep study equipment.

## 2023-03-28 ENCOUNTER — Ambulatory Visit: Payer: BC Managed Care – PPO

## 2023-03-28 DIAGNOSIS — M6281 Muscle weakness (generalized): Secondary | ICD-10-CM

## 2023-03-28 DIAGNOSIS — G8929 Other chronic pain: Secondary | ICD-10-CM

## 2023-03-28 DIAGNOSIS — M5412 Radiculopathy, cervical region: Secondary | ICD-10-CM

## 2023-03-28 DIAGNOSIS — R293 Abnormal posture: Secondary | ICD-10-CM

## 2023-03-28 DIAGNOSIS — M62838 Other muscle spasm: Secondary | ICD-10-CM

## 2023-03-28 NOTE — Therapy (Signed)
OUTPATIENT PHYSICAL THERAPY TREATMENT   Patient Name: Eneida Bassi MRN: [id number] DOB:[birth year], 62 y.o., female Today's Date: 03/28/2023   END OF SESSION:  PT End of Session - 03/28/23 0906     Visit Number 5    Date for PT Re-Evaluation 05/08/23    Authorization Type [employer]    PT Start Time 0802    PT Stop Time 0848    PT Time Calculation (min) 46 min    Activity Tolerance Patient tolerated treatment well    Behavior During Therapy [facility] for tasks assessed/performed                 Past Medical History:  Diagnosis Date   Acoustic neuroma (HCC)    ADD (attention deficit disorder)    and OCD-on Vyvanse   Asthma    w/out status asthmaticus   Awareness under anesthesia    Balance problem    after acoustic neuroma excision   Chronic back pain    Complication of anesthesia    unrecognized difficult intubation   Constipation, chronic    IBS, CIC   Difficult airway    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   Acustic Neuroma - Brain tumor    GERD (gastroesophageal reflux disease)    History of diabetes mellitus    was prediabetes, off metformin   Iron deficiency anemia, unspecified 02/18/2013   Obesity    296 lbs (highest weight in 2012)    Tendonitis, Achilles, left    Past Surgical History:  Procedure Laterality Date   ABLATION     and bladder sling   ACHILLES TENDON REPAIR     x2   ANAL FISSURE REPAIR  2012   BACK SURGERY     CHOLECYSTECTOMY     COLONOSCOPY WITH PROPOFOL N/A 01/20/2021   Procedure: COLONOSCOPY WITH PROPOFOL;  Surgeon: Jeani Hawking, MD;  Location: [facility];  Service: Endoscopy;  Laterality: N/A;   CRANIOTOMY  09/13/2015   with excision of acoustic neuroma   ESOPHAGOGASTRODUODENOSCOPY (EGD) WITH PROPOFOL N/A 07/22/2020   Procedure: ESOPHAGOGASTRODUODENOSCOPY (EGD) WITH PROPOFOL;  Surgeon: Jeani Hawking, MD;  Location: [facility];  Service: Endoscopy;  Laterality: N/A;   IMPLANTATION BONE ANCHORED HEARING AID     MOUTH SURGERY  2015   3 implants   OVARIAN  CYST REMOVAL     x4   THYROID LOBECTOMY Right 04/28/2015   Partial    TOE FUSION Left    Patient Active Problem List   Diagnosis Date Noted   Class 1 obesity due to excess calories without serious comorbidity with body mass index (BMI) of 30.0 to 30.9 in adult 03/08/2023   Non-restorative sleep 03/08/2023   Post-viral cough syndrome 05/30/2022   Strain of lumbar region 02/05/2022   No energy 11/29/2021   History of sleep apnea 11/29/2021   Orthostatic hypotension 08/14/2021   Irritation of left radial nerve 03/16/2021   Right-sided chest wall pain 11/28/2020   History of endometrial ablation 10/28/2020   History of partial thyroidectomy 10/28/2020   Chronic gastritis without bleeding 05/27/2020   Hiatal hernia 05/27/2020   Nausea 05/27/2020   Tremor of right hand 02/26/2020   Hearing loss of right ear 11/24/2019   Basal cell carcinoma of left upper arm 10/21/2019   Atrophic vaginitis 06/03/2019   Family history of osteoporosis 03/05/2019   Post-menopausal 03/05/2019   Motion sickness 12/26/2017   Compression of right radial nerve 06/26/2017   Fatty liver disease, nonalcoholic 03/26/2017   Left renal mass 03/26/2017   Obesity (BMI  30.0-34.9) 03/26/2017   Irritable 10/17/2016   Chronic left-sided low back pain 09/19/2016   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HCC) 12/06/2015   Deafness in left ear 12/06/2015   Hashimoto's thyroiditis 06/14/2015   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   Hypokalemia 02/13/2015   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HCC) 02/11/2015   HSV infection 08/26/2014   ADD (attention deficit disorder) 05/11/2014   Chronic constipation 05/11/2014   Essential (primary) hypertension 05/11/2014   Gastro-esophageal reflux disease without esophagitis 05/11/2014   Asthma, mild persistent 05/11/2014   Interdigital neuralgia of foot 04/14/2013   Iron deficiency anemia, unspecified 02/18/2013    PCP: Jomarie Longs, PA-C   REFERRING PROVIDER: Donalee Citrin, MD   REFERRING  DIAG:  M54.40 (ICD-10-CM) - Lumbago with sciatica, unspecified side  M54.12 (ICD-10-CM) - Cervical radiculopathy at C6   THERAPY DIAG:  Radiculopathy, cervical region  Chronic bilateral low back pain with left-sided sciatica  Abnormal posture  Muscle weakness (generalized)  Other muscle spasm  RATIONALE FOR EVALUATION AND TREATMENT: Rehabilitation  ONSET DATE: ~6 months for neck pain, chronic LBP  NEXT MD VISIT: Unknown   SUBJECTIVE:                                                                                                                                                                                                         SUBJECTIVE STATEMENT: Pt reports great benefit from DN, neck pain is way better but she still gets random tingling in L UE down to fingers.  PAIN: Are you having pain? Yes: NPRS scale: 1/10 Pain location: L>R neck, upper shoulder with intermittent L UE numbness and tingling to radial side of hand Pain description: tingly, occasional sharp, numb Aggravating factors: variable - sometimes with sitting, also when walking in the park Relieving factors: nothing  Are you having pain? Yes: NPRS scale: 2/10 Pain location: L>R low back with sciatic pain and tingling into distal L LE Pain description: achy in low back, occasional sharp when when attempting to return to stand after bending over, sharp sciatic pain Aggravating factors: bending over, lifting, walking for sciatic pain Relieving factors: injections, Tiger Balm patches, heat, exercises from prior PT HEP  PERTINENT HISTORY:  Acoustic neuroma - deafness in L ear, ADD, asthma, balance problem (after acoustic neuroma), chronic back pain, GERD, DM, orthostatic hypotension, Hashimoto's thyroiditis  PRECAUTIONS: None  HAND DOMINANCE: Right  RED FLAGS: None  WEIGHT BEARING RESTRICTIONS: No  FALLS:  Has patient fallen in last 6 months? Yes. 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 Advanced degenerative foraminal impingement on the left at C5-6. 2. Moderate degenerative foraminal narrowing on the right at C6-7. 3. Diffusely patent spinal canal.  01/20/23 - MRI Lumbar spine: IMPRESSION: 1. Generalized lumbar spine degeneration greatest at L5-S1. No significant change when compared to 2022. 2. L5-S1 left foraminal impingement. Noncompressive bilateral foraminal narrowing at L2-3. 3. Diffusely patent spinal canal.  PATIENT SURVEYS:  Modified Oswestry 10 / 50 = 20.0 %  NDI 9 / 50 = 18.0 %  SCREENING FOR RED FLAGS: Bowel or bladder incontinence: No Spinal tumors: No Cauda equina syndrome: No Compression fracture: No Abdominal aneurysm: No  COGNITION: Overall cognitive status: Within functional limits for tasks assessed     SENSATION: WFL Intermittent L UE numbness and tingling, L LE sciatica and tingling  MUSCLE LENGTH: Hamstrings: Mild tight B ITB: Mild tight B Piriformis: Mild tight B Hip IR: Mod/severe tight B Hip flexors: Mild/mod tight L>R Quads: WFL  POSTURE:  rounded shoulders, forward head, decreased lumbar lordosis, and increased thoracic kyphosis  PALPATION: TTP with increased muscle tension in L>R cervicothoracic and lumbar paraspinals, upper shoulder and periscapular muscles, as well as  glutes/piriformis  CERVICAL ROM:   Active ROM Eval  Flexion 34 ^  Extension 44 - made tingling go away  Right lateral flexion 30  Left lateral flexion 34  Right rotation 63  Left rotation 51 - triggered tingling    (Blank rows = not tested, ^ - increased pain)  UPPER EXTREMITY ROM:  Active ROM Right eval Left eval  Shoulder flexion 142 124  Shoulder extension 49 42  Shoulder abduction 161 148  Shoulder adduction    Shoulder internal rotation FIR WNL FIR WNL  Shoulder external rotation FER T3 FER T1  6 (Blank rows = not tested)  UPPER EXTREMITY MMT:  MMT Right eval Left eval  Shoulder flexion 4+ 4  Shoulder extension 4+ 4  Shoulder abduction 4+ 4  Shoulder adduction    Shoulder internal rotation 4+ 4-  Shoulder external rotation 4 4-  Middle trapezius 4- 4-  Lower trapezius 3+ 3+  (Blank rows = not tested)  LUMBAR ROM:   Active  Eval  Flexion Hands to mid shins  Extension 25% limited  Right lateral flexion Hand to fibular head  Left lateral flexion Hand to lateral knee  Right rotation WFL  Left rotation WFL    (Blank rows = not tested)  LOWER EXTREMITY ROM:    B LE AROM grossly WFL  LOWER EXTREMITY MMT:    MMT Right eval Left eval  Hip flexion 4 4-  Hip extension 4+ 4+  Hip abduction 4 3+  Hip adduction 4- 4  Hip internal rotation 5 4+  Hip external rotation 4 4-  Knee flexion 5 4+  Knee extension 5 4  Ankle dorsiflexion 5 5  Ankle plantarflexion    Ankle inversion  Ankle eversion      (Blank rows = not tested)  FUNCTIONAL TESTS:  Functional gait assessment: 21/30, 19-24 = medium risk fall (03/22/23)   TODAY'S TREATMENT:  03/28/23 THERAPEUTIC EXERCISE: to improve flexibility, strength and mobility.  Demonstration, verbal and tactile cues throughout for technique.  UBE - L2.5 x 6 min (3' each fwd & back) HEP review: - Standing ER back to ball RTB x 10 - cues to avoid dropping  -Standing horizontal ABD RTB anchored in door x 10  Shld ext +  scap retraction RTB x 10 - cues to avoid shrugging shoulders Seated L piriformis stretch (hip hinge) 2x30" Supine wand shld flexion for thoracic ext x10; wand B arm horizontal ABD and ADD x 10 Seated wand shld flexion x 10  Seated rows 15# 2x10 low grips Leg press 20# 2x10 BLE Knee flexion 15# x 10 BLE  03/26/23 THERAPEUTIC EXERCISE: to improve flexibility, strength and mobility.  Demonstration, verbal and tactile cues throughout for technique.  UBE - L2.5 x 6 min (3' each fwd & back) L hooklying figure-4 piriformis stretch 2 x 30" L hooklying KTOS piriformis stretch 2 x 30" Bridge + GTB alt hip ABD/ER x 10 S/L L GTB clam 10 x 3" STS + GTB hip ABD isometric x 10  MANUAL THERAPY: To promote normalized muscle tension, improved flexibility, and reduced pain. Skilled palpation and monitoring of soft tissue during DN Trigger Point Dry-Needling  Treatment instructions: Expect mild to moderate muscle soreness. S/S of pneumothorax if dry needled over a lung field, and to seek immediate medical attention should they occur. Patient verbalized understanding of these instructions and education. Patient Consent Given: Yes Education handout provided: Previously provided Muscles treated: L glute medius & minimus, piriformis; L UT and subscapularis Electrical stimulation performed: No Parameters: N/A Treatment response/outcome: Twitch Response Elicited and Palpable Increase in Muscle Length STM/DTM, manual TPR and pin & stretch to muscles addressed with DN   03/22/23 THERAPEUTIC EXERCISE: to improve flexibility, strength and mobility.  Demonstration, verbal and tactile cues throughout for technique.  UBE - L1.5 x 6 min (3' each fwd & back) L hooklying figure-4 piriformis stretch x 30" L hooklying KTOS piriformis stretch x 30"  THERAPEUTIC ACTIVITIES: FGA = 21/30  MANUAL THERAPY: To promote normalized muscle tension, improved flexibility, and reduced pain. Skilled palpation and monitoring of  soft tissue during DN Trigger Point Dry-Needling  Treatment instructions: Expect mild to moderate muscle soreness. S/S of pneumothorax if dry needled over a lung field, and to seek immediate medical attention should they occur. Patient verbalized understanding of these instructions and education. Patient Consent Given: Yes Education handout provided: Yes Muscles treated: L glute medius and minimus Electrical stimulation performed: No Parameters: N/A Treatment response/outcome: Twitch Response Elicited and Palpable Increase in Muscle Length STM/DTM, manual TPR and pin & stretch to muscles addressed with DN   03/18/23 Therapeutic Exercise: to improve strength and mobility.  Demo, verbal and tactile cues throughout for technique.  UBE L1.0 3 min each way Bent knee fallouts GTB 10x5" single Bridge with hip ABD x 10  Clamshell GTB x 10  Cervical extension pillowcase 10x3" Cervical rotation with pillowcase 10x3" Standing B ER RTB x 10  Standing horizontal ABD RTB x 10  Standing row and shld ext RTB 2x10 each Cat cow reviewed, prone hip extension reviewed   03/13/23 - Eval only   PATIENT EDUCATION:  Education details: HEP update - glute/piriformis stretches, role of DN, and DN rational, procedure, outcomes, potential side  effects, and recommended post-treatment exercises/activity   Person educated: Patient Education method: Explanation, Demonstration, Verbal cues, Handouts, and MedBridgeGO app updated Education comprehension: verbalized understanding  HOME EXERCISE PROGRAM: *Access Code: [email or web address] URL: [email or web address] Date: 03/22/2023 Prepared by: Glenetta Hew  Exercises - Hooklying Single Leg Bent Knee Fallouts with Resistance  - 1 x daily - 7 x weekly - 2 sets - 10 reps - 3 sec hold - Bridge with Hip Abduction and Resistance  - 1 x daily - 7 x weekly - 2 sets - 10 reps - 5 sec hold - Clam with Resistance  - 1 x daily - 7 x weekly - 2 sets - 10 reps - 3-5 sec  hold - Cat Cow  - 2 x daily - 7 x weekly - 2 sets - 10 reps - 5 sec hold - Quadruped Alternating Leg Extensions  - 1 x daily - 7 x weekly - 2 sets - 10 reps - 3 sec hold - Cervical Extension AROM with Strap  - 1 x daily - 7 x weekly - 2 sets - 10 reps - Seated Assisted Cervical Rotation with Towel  - 1 x daily - 7 x weekly - 2 sets - 10 reps - Shoulder External Rotation and Scapular Retraction with Resistance  - 1 x daily - 3 x weekly - 2 sets - 10 reps - Standing Shoulder Horizontal Abduction with Resistance  - 1 x daily - 3 x weekly - 2 sets - 10 reps - Supine Figure 4 Piriformis Stretch (Mirrored)  - 2 x daily - 7 x weekly - 3 reps - 30 sec hold - Supine Piriformis Stretch with Foot on Ground (Mirrored)  - 2 x daily - 7 x weekly - 3 reps - 30 sec hold  Patient Education - Trigger Point Dry Needling  *Patient using MedBridgeGO app   ASSESSMENT:  CLINICAL IMPRESSION: Pt continues to report intermittent N/T in L UE randomly. She demonstrates kyphotic thoracic posture, so continued with postural strengthening and added more thoracic mobility exercises. She also wanted to review her postural strengthening exercises which she needed cues with these exercises. Reviewed gym machines for strengthening while reviewing safety and correct positioning.  Yvette will benefit from continued skilled PT to address ongoing deficits to improve mobility and activity tolerance with decreased pain interference.   OBJECTIVE IMPAIRMENTS: Abnormal gait, decreased activity tolerance, decreased balance, decreased coordination, decreased endurance, decreased knowledge of condition, decreased mobility, difficulty walking, decreased ROM, decreased strength, dizziness, increased fascial restrictions, impaired perceived functional ability, increased muscle spasms, impaired flexibility, impaired sensation, impaired UE functional use, improper body mechanics, postural dysfunction, and pain.   ACTIVITY LIMITATIONS: carrying,  lifting, bending, squatting, sleeping, transfers, bed mobility, reach over head, and locomotion level  PARTICIPATION LIMITATIONS: meal prep, cleaning, laundry, driving, shopping, community activity, and occupation  PERSONAL FACTORS: Fitness, Past/current experiences, Time since onset of injury/illness/exacerbation, and 3+ comorbidities: Acoustic neuroma - deafness in L ear, ADD, asthma, balance problem (after acoustic neuroma), chronic back pain, GERD, DM, orthostatic hypotension, Hashimoto's thyroiditis  are also affecting patient's functional outcome.   REHAB POTENTIAL: Good  CLINICAL DECISION MAKING: Evolving/moderate complexity  EVALUATION COMPLEXITY: Moderate   GOALS: Goals reviewed with patient? Yes  SHORT TERM GOALS: Target date: 04/10/2023  Patient will be independent with initial HEP to improve outcomes and carryover.  Baseline: Initial HEP provided 03/18/2023 Goal status: MET - 03/26/23   2.  Patient will report reduced frequency and intensity and/or centralization of radicular symptoms.  Baseline: L UE  radicular numbness and tingling into radial side of hand, L>R LE radicular pain and tingling into distal LE Goal status: IN PROGRESS- 03/28/23- Pt reports less frequency of N/T   3.  Complete FGA. Baseline: TBA Goal status: MET  03/22/23  LONG TERM GOALS: Target date: 05/08/2023  Patient will be independent with ongoing/advanced HEP for self-management at home.  Baseline:  Goal status: IN PROGRESS  2.  Patient will report 50-75% improvement in neck and back pain as well as associated radicular pain/numbness/tingling to improve QOL.  Baseline: L>R neck & upper shoulder up to 5/10 with frequent L UE radicular numbness and tingling into radial side of hand, LBP up to 8/10 with L>R LE radicular pain and tingling into distal LE Goal status: IN PROGRESS  3.  Patient will demonstrate improved posture to decrease muscle imbalance. Baseline: Severely forward head and rounded  shoulder posture with increased thoracic kyphosis and flattening/reversal of lumbar lordosis Goal status: IN PROGRESS  4.  Patient to demonstrate ability to achieve and maintain good spinal alignment and body mechanics needed for daily activities. Baseline:  Goal status: IN PROGRESS  5.  Patient will demonstrate functional pain free cervical ROM for safety with driving.  Baseline: Refer to above cervical ROM table Goal status: IN PROGRESS  6.  Patient will demonstrate functional pain free lumbar ROM to perform ADLs.   Baseline: Refer to above lumbar ROM table Goal status: IN PROGRESS  7.  Patient will demonstrate improved functional strength as demonstrated by overall B proximal UE and LE MMT >/= 4+/5. Baseline: Refer to above UE and LE MMT tables Goal status: IN PROGRESS  8.  Patient will report </= 8% on modified Oswestry to demonstrate improved functional ability.  Baseline: 10 / 50 = 20.0 % Goal status: IN PROGRESS   9.  Patient will report </= 5% on NDI to demonstrate improved functional ability.  Baseline: 9 / 50 = 18.0 % Goal status: IN PROGRESS  10.  Patient to report ability to perform ADLs, household, and work-related tasks without limitation due to neck or back pain, radiculopathy, LOM or weakness. Baseline:  Goal status: IN PROGRESS   11.  Patient will improve FGA score to >/= 25/30 to improve gait stability and reduce risk for falls. Baseline: 21/30 Goal status: IN PROGRESS    PLAN:  PT FREQUENCY: 2x/week  PT DURATION: 8 weeks  PLANNED INTERVENTIONS: Therapeutic exercises, Therapeutic activity, Neuromuscular re-education, Balance training, Gait training, Patient/Family education, Self Care, Joint mobilization, DME instructions, Aquatic Therapy, Dry Needling, Electrical stimulation, Spinal manipulation, Spinal mobilization, Cryotherapy, Moist heat, Taping, Traction, Ultrasound, Manual therapy, and Re-evaluation  PLAN FOR NEXT SESSION:  review HEP if needed;  gentle cervical stretching/ROM and postural training; posture and body mechanics education including review of proper desk setup; MT +/- DN to address abnormal muscle tension and pain  Nevia Henkin L Charlaine Utsey, PTA 03/28/2023, 9:07 AM

## 2023-04-01 ENCOUNTER — Ambulatory Visit: Payer: BC Managed Care – PPO | Admitting: Physical Therapy

## 2023-04-01 ENCOUNTER — Encounter: Payer: Self-pay | Admitting: Physical Therapy

## 2023-04-01 ENCOUNTER — Other Ambulatory Visit: Payer: Self-pay | Admitting: Physician Assistant

## 2023-04-01 DIAGNOSIS — G8929 Other chronic pain: Secondary | ICD-10-CM

## 2023-04-01 DIAGNOSIS — M5412 Radiculopathy, cervical region: Secondary | ICD-10-CM | POA: Diagnosis not present

## 2023-04-01 DIAGNOSIS — M6281 Muscle weakness (generalized): Secondary | ICD-10-CM

## 2023-04-01 DIAGNOSIS — M62838 Other muscle spasm: Secondary | ICD-10-CM

## 2023-04-01 DIAGNOSIS — R454 Irritability and anger: Secondary | ICD-10-CM

## 2023-04-01 DIAGNOSIS — R293 Abnormal posture: Secondary | ICD-10-CM

## 2023-04-01 NOTE — Therapy (Signed)
OUTPATIENT PHYSICAL THERAPY TREATMENT   Patient Name: Cynthia [name] MRN: [id number] DOB:Jan 16, 1961, 62 y.o., female Today's Date: 04/01/2023   END OF SESSION:  PT End of Session - 04/01/23 0804     Visit Number 6    Date for PT Re-Evaluation 05/08/23    Authorization Type BC[employer]    PT Start Time 0804    PT Stop Time 0844    PT Time Calculation (min) 40 min    Activity Tolerance Patient tolerated treatment well    Behavior During Therapy [facility] for tasks assessed/performed                  Past Medical History:  Diagnosis Date   Acoustic neuroma (HCC)    ADD (attention deficit disorder)    and OCD-on Vyvanse   Asthma    w/out status asthmaticus   Awareness under anesthesia    Balance problem    after acoustic neuroma excision   Chronic back pain    Complication of anesthesia    unrecognized difficult intubation   Constipation, chronic    IBS, CIC   Difficult airway    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   Acustic Neuroma - Brain tumor    GERD (gastroesophageal reflux disease)    History of diabetes mellitus    was prediabetes, off metformin   Iron deficiency anemia, unspecified 02/18/2013   Obesity    296 lbs (highest weight in 2012)    Tendonitis, Achilles, left    Past Surgical History:  Procedure Laterality Date   ABLATION     and bladder sling   ACHILLES TENDON REPAIR     x2   ANAL FISSURE REPAIR  2012   BACK SURGERY     CHOLECYSTECTOMY     COLONOSCOPY WITH PROPOFOL N/A 01/20/2021   Procedure: COLONOSCOPY WITH PROPOFOL;  Surgeon: Jeani Hawking, MD;  Location: [facility];  Service: Endoscopy;  Laterality: N/A;   CRANIOTOMY  09/13/2015   with excision of acoustic neuroma   ESOPHAGOGASTRODUODENOSCOPY (EGD) WITH PROPOFOL N/A 07/22/2020   Procedure: ESOPHAGOGASTRODUODENOSCOPY (EGD) WITH PROPOFOL;  Surgeon: Jeani Hawking, MD;  Location: [facility];  Service: Endoscopy;  Laterality: N/A;   IMPLANTATION BONE ANCHORED HEARING AID     MOUTH SURGERY  2015   3 implants    OVARIAN CYST REMOVAL     x4   THYROID LOBECTOMY Right 04/28/2015   Partial    TOE FUSION Left    Patient Active Problem List   Diagnosis Date Noted   Class 1 obesity due to excess calories without serious comorbidity with body mass index (BMI) of 30.0 to 30.9 in adult 03/08/2023   Non-restorative sleep 03/08/2023   Post-viral cough syndrome 05/30/2022   Strain of lumbar region 02/05/2022   No energy 11/29/2021   History of sleep apnea 11/29/2021   Orthostatic hypotension 08/14/2021   Irritation of left radial nerve 03/16/2021   Right-sided chest wall pain 11/28/2020   History of endometrial ablation 10/28/2020   History of partial thyroidectomy 10/28/2020   Chronic gastritis without bleeding 05/27/2020   Hiatal hernia 05/27/2020   Nausea 05/27/2020   Tremor of right hand 02/26/2020   Hearing loss of right ear 11/24/2019   Basal cell carcinoma of left upper arm 10/21/2019   Atrophic vaginitis 06/03/2019   Family history of osteoporosis 03/05/2019   Post-menopausal 03/05/2019   Motion sickness 12/26/2017   Compression of right radial nerve 06/26/2017   Fatty liver disease, nonalcoholic 03/26/2017   Left renal mass 03/26/2017   Obesity (  BMI 30.0-34.9) 03/26/2017   Irritable 10/17/2016   Chronic left-sided low back pain 09/19/2016   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HCC) 12/06/2015   Deafness in left ear 12/06/2015   Hashimoto's thyroiditis 06/14/2015   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   Hypokalemia 02/13/2015   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HCC) 02/11/2015   HSV infection 08/26/2014   ADD (attention deficit disorder) 05/11/2014   Chronic constipation 05/11/2014   Essential (primary) hypertension 05/11/2014   Gastro-esophageal reflux disease without esophagitis 05/11/2014   Asthma, mild persistent 05/11/2014   Interdigital neuralgia of foot 04/14/2013   Iron deficiency anemia, unspecified 02/18/2013    PCP: Jomarie Longs, PA-C   REFERRING PROVIDER: Donalee Citrin, MD    REFERRING DIAG:  M54.40 (ICD-10-CM) - Lumbago with sciatica, unspecified side  M54.12 (ICD-10-CM) - Cervical radiculopathy at C6   THERAPY DIAG:  Radiculopathy, cervical region  Chronic bilateral low back pain with left-sided sciatica  Abnormal posture  Muscle weakness (generalized)  Other muscle spasm  RATIONALE FOR EVALUATION AND TREATMENT: Rehabilitation  ONSET DATE: ~6 months for neck pain, chronic LBP  NEXT MD VISIT: Unknown   SUBJECTIVE:                                                                                                                                                                                                         SUBJECTIVE STATEMENT: Pt reports her neck remains better since the DN. The back is better as well but having more frequent LE radicular pain when standing.  PAIN: Are you having pain? Yes: NPRS scale: 0/10 Pain location: neck Pain description: occasional tingling into L UE Aggravating factors: variable - sometimes with sitting, also when walking in the park Relieving factors: nothing  Are you having pain? Yes: NPRS scale: 0/10 Pain location: low back with L sciatic pain and tingling into distal LE Pain description: minimal to no low back pain, sciatic pain when standing Aggravating factors: standing for sciatic pain Relieving factors: injections, Tiger Balm patches, heat, exercises from prior PT HEP  PERTINENT HISTORY:  Acoustic neuroma - deafness in L ear, ADD, asthma, balance problem (after acoustic neuroma), chronic back pain, GERD, DM, orthostatic hypotension, Hashimoto's thyroiditis  PRECAUTIONS: None  HAND DOMINANCE: Right  RED FLAGS: None  WEIGHT BEARING RESTRICTIONS: No  FALLS:  Has patient fallen in last 6 months? Yes. 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 Advanced degenerative foraminal impingement on the left at C5-6. 2. Moderate degenerative foraminal narrowing on the right at C6-7. 3. Diffusely patent spinal canal.  01/20/23 - MRI Lumbar spine: IMPRESSION: 1. Generalized lumbar spine degeneration greatest at L5-S1. No significant change when compared to 2022. 2. L5-S1 left foraminal impingement. Noncompressive bilateral foraminal narrowing at L2-3. 3. Diffusely patent spinal canal.  PATIENT SURVEYS:  Modified Oswestry 10 / 50 = 20.0 %  NDI 9 / 50 = 18.0 %  SCREENING FOR RED FLAGS: Bowel or bladder incontinence: No Spinal tumors: No Cauda equina syndrome: No Compression fracture: No Abdominal aneurysm: No  COGNITION: Overall cognitive status: Within functional limits for tasks assessed     SENSATION: WFL Intermittent L UE numbness and tingling, L LE sciatica and tingling  MUSCLE LENGTH: Hamstrings: Mild tight B ITB: Mild tight B Piriformis: Mild tight B Hip IR: Mod/severe tight B Hip flexors: Mild/mod tight L>R Quads: WFL  POSTURE:  rounded shoulders, forward head, decreased lumbar lordosis, and increased thoracic kyphosis  PALPATION: TTP with increased muscle tension in L>R cervicothoracic and lumbar paraspinals, upper shoulder and periscapular muscles, as well as glutes/piriformis  CERVICAL ROM:   Active ROM Eval  Flexion 34 ^  Extension 44 - made tingling go away  Right lateral flexion 30  Left lateral flexion 34   Right rotation 63  Left rotation 51 - triggered tingling    (Blank rows = not tested, ^ - increased pain)  UPPER EXTREMITY ROM:  Active ROM Right eval Left eval  Shoulder flexion 142 124  Shoulder extension 49 42  Shoulder abduction 161 148  Shoulder adduction    Shoulder internal rotation FIR WNL FIR WNL  Shoulder external rotation FER T3 FER T1  6 (Blank rows = not tested)  UPPER EXTREMITY MMT:  MMT Right eval Left eval  Shoulder flexion 4+ 4  Shoulder extension 4+ 4  Shoulder abduction 4+ 4  Shoulder adduction    Shoulder internal rotation 4+ 4-  Shoulder external rotation 4 4-  Middle trapezius 4- 4-  Lower trapezius 3+ 3+  (Blank rows = not tested)  LUMBAR ROM:   Active  Eval  Flexion Hands to mid shins  Extension 25% limited  Right lateral flexion Hand to fibular head  Left lateral flexion Hand to lateral knee  Right rotation WFL  Left rotation WFL    (Blank rows = not tested)  LOWER EXTREMITY ROM:    B LE AROM grossly WFL  LOWER EXTREMITY MMT:    MMT Right eval Left eval  Hip flexion 4 4-  Hip extension 4+ 4+  Hip abduction 4 3+  Hip adduction 4- 4  Hip internal rotation 5 4+  Hip external rotation 4 4-  Knee flexion 5 4+  Knee extension 5 4  Ankle dorsiflexion 5 5  Ankle plantarflexion    Ankle inversion    Ankle eversion      (Blank rows = not tested)  FUNCTIONAL TESTS:  Functional gait assessment: 21/30, 19-24 =  medium risk fall (03/22/23)   TODAY'S TREATMENT:   04/01/23 THERAPEUTIC EXERCISE: to improve flexibility, strength and mobility.  Demonstration, verbal and tactile cues throughout for technique.  UBE - L3.0 x 6 min (3' each fwd & back) L hooklying figure-4 piriformis stretch 2 x 30" L hooklying KTOS piriformis stretch 2 x 30" Quadruped fire hydrants 2 x 10 bil Standing L hip ABD isometric into ball on wall 10 x 5" Standing L hip ER isometric into ball on wall 10 x 5" Standing L glute medius stretch (figure-4 squat) with  counter support 3 x 15-20"  MANUAL THERAPY: To promote normalized muscle tension, improved flexibility, and reduced pain. Skilled palpation and monitoring of soft tissue during DN Trigger Point Dry-Needling  Treatment instructions: Expect mild to moderate muscle soreness.  Patient verbalized understanding of these instructions and education. Patient Consent Given: Yes Education handout provided: Previously provided Muscles treated: L glute medius & minimus Electrical stimulation performed: No Parameters: N/A Treatment response/outcome: Twitch Response Elicited and Palpable Increase in Muscle Length STM/DTM, manual TPR and pin & stretch to muscles addressed with DN   03/28/23 THERAPEUTIC EXERCISE: to improve flexibility, strength and mobility.  Demonstration, verbal and tactile cues throughout for technique.  UBE - L2.5 x 6 min (3' each fwd & back) HEP review: - Standing ER back to ball RTB x 10 - cues to avoid dropping  -Standing horizontal ABD RTB anchored in door x 10  Shld ext + scap retraction RTB x 10 - cues to avoid shrugging shoulders Seated L piriformis stretch (hip hinge) 2x30" Supine wand shld flexion for thoracic ext x10; wand B arm horizontal ABD and ADD x 10 Seated wand shld flexion x 10  Seated rows 15# 2x10 low grips Leg press 20# 2x10 BLE Knee flexion 15# x 10 BLE   03/26/23 THERAPEUTIC EXERCISE: to improve flexibility, strength and mobility.  Demonstration, verbal and tactile cues throughout for technique.  UBE - L2.5 x 6 min (3' each fwd & back) L hooklying figure-4 piriformis stretch 2 x 30" L hooklying KTOS piriformis stretch 2 x 30" Bridge + GTB alt hip ABD/ER x 10 S/L L GTB clam 10 x 3" STS + GTB hip ABD isometric x 10  MANUAL THERAPY: To promote normalized muscle tension, improved flexibility, and reduced pain. Skilled palpation and monitoring of soft tissue during DN Trigger Point Dry-Needling  Treatment instructions: Expect mild to moderate muscle  soreness.  Patient verbalized understanding of these instructions and education. Patient Consent Given: Yes Education handout provided: Previously provided Muscles treated: L glute medius & minimus, piriformis; L UT and subscapularis Electrical stimulation performed: No Parameters: N/A Treatment response/outcome: Twitch Response Elicited and Palpable Increase in Muscle Length STM/DTM, manual TPR and pin & stretch to muscles addressed with DN   PATIENT EDUCATION:  Education details: role of DN and DN rational, procedure, outcomes, potential side effects, and recommended post-treatment exercises/activity   Person educated: Patient Education method: Explanation, Verbal cues, and MedBridgeGO app updated Education comprehension: verbalized understanding  HOME EXERCISE PROGRAM: *Access Code: [email or web address] URL: [email or web address] Date: 03/22/2023 Prepared by: Glenetta Hew  Exercises - Hooklying Single Leg Bent Knee Fallouts with Resistance  - 1 x daily - 7 x weekly - 2 sets - 10 reps - 3 sec hold - Bridge with Hip Abduction and Resistance  - 1 x daily - 7 x weekly - 2 sets - 10 reps - 5 sec hold - Clam with Resistance  - 1 x daily - 7 x weekly -  2 sets - 10 reps - 3-5 sec hold - Cat Cow  - 2 x daily - 7 x weekly - 2 sets - 10 reps - 5 sec hold - Quadruped Alternating Leg Extensions  - 1 x daily - 7 x weekly - 2 sets - 10 reps - 3 sec hold - Cervical Extension AROM with Strap  - 1 x daily - 7 x weekly - 2 sets - 10 reps - Seated Assisted Cervical Rotation with Towel  - 1 x daily - 7 x weekly - 2 sets - 10 reps - Shoulder External Rotation and Scapular Retraction with Resistance  - 1 x daily - 3 x weekly - 2 sets - 10 reps - Standing Shoulder Horizontal Abduction with Resistance  - 1 x daily - 3 x weekly - 2 sets - 10 reps - Supine Figure 4 Piriformis Stretch (Mirrored)  - 2 x daily - 7 x weekly - 3 reps - 30 sec hold - Supine Piriformis Stretch with Foot on Ground (Mirrored)  - 2  x daily - 7 x weekly - 3 reps - 30 sec hold  Patient Education - Trigger Point Dry Needling  *Patient using MedBridgeGO app   ASSESSMENT:  CLINICAL IMPRESSION: Skylah reports good relief from last DN session with neck pain mostly resolved and only infrequent N/T into L UE. LBP also improved but more frequent L LE radicular pain, numbness and tingling noted recently. Radicular symptoms appearing to originate from the upper glutes with DN to glute medius and minimus able to reproduce her radicular symptoms.  Stretching and strengthening targeting glutes with emphasis on glute medius and minimus to further normalize muscle activation.  Pt noting increased effort necessary with strengthening on L.  Berklie will benefit from continued skilled PT to address ongoing deficits to improve mobility and activity tolerance with decreased pain interference.   OBJECTIVE IMPAIRMENTS: Abnormal gait, decreased activity tolerance, decreased balance, decreased coordination, decreased endurance, decreased knowledge of condition, decreased mobility, difficulty walking, decreased ROM, decreased strength, dizziness, increased fascial restrictions, impaired perceived functional ability, increased muscle spasms, impaired flexibility, impaired sensation, impaired UE functional use, improper body mechanics, postural dysfunction, and pain.   ACTIVITY LIMITATIONS: carrying, lifting, bending, squatting, sleeping, transfers, bed mobility, reach over head, and locomotion level  PARTICIPATION LIMITATIONS: meal prep, cleaning, laundry, driving, shopping, community activity, and occupation  PERSONAL FACTORS: Fitness, Past/current experiences, Time since onset of injury/illness/exacerbation, and 3+ comorbidities: Acoustic neuroma - deafness in L ear, ADD, asthma, balance problem (after acoustic neuroma), chronic back pain, GERD, DM, orthostatic hypotension, Hashimoto's thyroiditis  are also affecting patient's functional outcome.    REHAB POTENTIAL: Good  CLINICAL DECISION MAKING: Evolving/moderate complexity  EVALUATION COMPLEXITY: Moderate   GOALS: Goals reviewed with patient? Yes  SHORT TERM GOALS: Target date: 04/10/2023  Patient will be independent with initial HEP to improve outcomes and carryover.  Baseline: Initial HEP provided 03/18/2023 Goal status: MET - 03/26/23   2.  Patient will report reduced frequency and intensity and/or centralization of radicular symptoms.  Baseline: L UE radicular numbness and tingling into radial side of hand, L>R LE radicular pain and tingling into distal LE Goal status: IN PROGRESS - 03/28/23 - Pt reports less frequency of N/T   3.  Complete FGA. Baseline: TBA Goal status: MET  03/22/23  LONG TERM GOALS: Target date: 05/08/2023  Patient will be independent with ongoing/advanced HEP for self-management at home.  Baseline:  Goal status: IN PROGRESS  2.  Patient will report 50-75% improvement  in neck and back pain as well as associated radicular pain/numbness/tingling to improve QOL.  Baseline: L>R neck & upper shoulder up to 5/10 with frequent L UE radicular numbness and tingling into radial side of hand, LBP up to 8/10 with L>R LE radicular pain and tingling into distal LE Goal status: IN PROGRESS  3.  Patient will demonstrate improved posture to decrease muscle imbalance. Baseline: Severely forward head and rounded shoulder posture with increased thoracic kyphosis and flattening/reversal of lumbar lordosis Goal status: IN PROGRESS  4.  Patient to demonstrate ability to achieve and maintain good spinal alignment and body mechanics needed for daily activities. Baseline:  Goal status: IN PROGRESS  5.  Patient will demonstrate functional pain free cervical ROM for safety with driving.  Baseline: Refer to above cervical ROM table Goal status: IN PROGRESS  6.  Patient will demonstrate functional pain free lumbar ROM to perform ADLs.   Baseline: Refer to above  lumbar ROM table Goal status: IN PROGRESS  7.  Patient will demonstrate improved functional strength as demonstrated by overall B proximal UE and LE MMT >/= 4+/5. Baseline: Refer to above UE and LE MMT tables Goal status: IN PROGRESS  8.  Patient will report </= 8% on modified Oswestry to demonstrate improved functional ability.  Baseline: 10 / 50 = 20.0 % Goal status: IN PROGRESS   9.  Patient will report </= 5% on NDI to demonstrate improved functional ability.  Baseline: 9 / 50 = 18.0 % Goal status: IN PROGRESS  10.  Patient to report ability to perform ADLs, household, and work-related tasks without limitation due to neck or back pain, radiculopathy, LOM or weakness. Baseline:  Goal status: IN PROGRESS   11.  Patient will improve FGA score to >/= 25/30 to improve gait stability and reduce risk for falls. Baseline: 21/30 Goal status: IN PROGRESS    PLAN:  PT FREQUENCY: 2x/week  PT DURATION: 8 weeks  PLANNED INTERVENTIONS: Therapeutic exercises, Therapeutic activity, Neuromuscular re-education, Balance training, Gait training, Patient/Family education, Self Care, Joint mobilization, DME instructions, Aquatic Therapy, Dry Needling, Electrical stimulation, Spinal manipulation, Spinal mobilization, Cryotherapy, Moist heat, Taping, Traction, Ultrasound, Manual therapy, and Re-evaluation  PLAN FOR NEXT SESSION:  review HEP if needed; gentle cervical stretching/ROM and postural training; posture and body mechanics education including review of proper desk setup; MT +/- DN to address abnormal muscle tension and pain  Marry Guan, PT 04/01/2023, 8:46 AM

## 2023-04-02 ENCOUNTER — Other Ambulatory Visit: Payer: Self-pay | Admitting: Physician Assistant

## 2023-04-04 ENCOUNTER — Other Ambulatory Visit: Payer: Self-pay | Admitting: Physician Assistant

## 2023-04-04 ENCOUNTER — Ambulatory Visit: Payer: BC Managed Care – PPO

## 2023-04-04 DIAGNOSIS — M5412 Radiculopathy, cervical region: Secondary | ICD-10-CM

## 2023-04-04 DIAGNOSIS — G8929 Other chronic pain: Secondary | ICD-10-CM

## 2023-04-04 DIAGNOSIS — M6281 Muscle weakness (generalized): Secondary | ICD-10-CM

## 2023-04-04 DIAGNOSIS — E6609 Other obesity due to excess calories: Secondary | ICD-10-CM

## 2023-04-04 DIAGNOSIS — R293 Abnormal posture: Secondary | ICD-10-CM

## 2023-04-04 DIAGNOSIS — M62838 Other muscle spasm: Secondary | ICD-10-CM

## 2023-04-04 NOTE — Therapy (Signed)
OUTPATIENT PHYSICAL THERAPY TREATMENT   Patient Name: Cynthia [name] MRN: [id number] DOB:[birth year], 62 y.o., female Today's Date: 04/04/2023   END OF SESSION:  PT End of Session - 04/04/23 0811     Visit Number 7    Date for PT Re-Evaluation 05/08/23    Authorization Type [employer]    PT Start Time 0803    PT Stop Time 0846    PT Time Calculation (min) 43 min    Activity Tolerance Patient tolerated treatment well    Behavior During Therapy [facility] for tasks assessed/performed                   Past Medical History:  Diagnosis Date   Acoustic neuroma (HCC)    ADD (attention deficit disorder)    and OCD-on Vyvanse   Asthma    w/out status asthmaticus   Awareness under anesthesia    Balance problem    after acoustic neuroma excision   Chronic back pain    Complication of anesthesia    unrecognized difficult intubation   Constipation, chronic    IBS, CIC   Difficult airway    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   Acustic Neuroma - Brain tumor    GERD (gastroesophageal reflux disease)    History of diabetes mellitus    was prediabetes, off metformin   Iron deficiency anemia, unspecified 02/18/2013   Obesity    296 lbs (highest weight in 2012)    Tendonitis, Achilles, left    Past Surgical History:  Procedure Laterality Date   ABLATION     and bladder sling   ACHILLES TENDON REPAIR     x2   ANAL FISSURE REPAIR  2012   BACK SURGERY     CHOLECYSTECTOMY     COLONOSCOPY WITH PROPOFOL N/A 01/20/2021   Procedure: COLONOSCOPY WITH PROPOFOL;  Surgeon: Jeani Hawking, MD;  Location: [facility];  Service: Endoscopy;  Laterality: N/A;   CRANIOTOMY  09/13/2015   with excision of acoustic neuroma   ESOPHAGOGASTRODUODENOSCOPY (EGD) WITH PROPOFOL N/A 07/22/2020   Procedure: ESOPHAGOGASTRODUODENOSCOPY (EGD) WITH PROPOFOL;  Surgeon: Jeani Hawking, MD;  Location: [facility];  Service: Endoscopy;  Laterality: N/A;   IMPLANTATION BONE ANCHORED HEARING AID     MOUTH SURGERY  2015   3 implants    OVARIAN CYST REMOVAL     x4   THYROID LOBECTOMY Right 04/28/2015   Partial    TOE FUSION Left    Patient Active Problem List   Diagnosis Date Noted   Class 1 obesity due to excess calories without serious comorbidity with body mass index (BMI) of 30.0 to 30.9 in adult 03/08/2023   Non-restorative sleep 03/08/2023   Post-viral cough syndrome 05/30/2022   Strain of lumbar region 02/05/2022   No energy 11/29/2021   History of sleep apnea 11/29/2021   Orthostatic hypotension 08/14/2021   Irritation of left radial nerve 03/16/2021   Right-sided chest wall pain 11/28/2020   History of endometrial ablation 10/28/2020   History of partial thyroidectomy 10/28/2020   Chronic gastritis without bleeding 05/27/2020   Hiatal hernia 05/27/2020   Nausea 05/27/2020   Tremor of right hand 02/26/2020   Hearing loss of right ear 11/24/2019   Basal cell carcinoma of left upper arm 10/21/2019   Atrophic vaginitis 06/03/2019   Family history of osteoporosis 03/05/2019   Post-menopausal 03/05/2019   Motion sickness 12/26/2017   Compression of right radial nerve 06/26/2017   Fatty liver disease, nonalcoholic 03/26/2017   Left renal mass 03/26/2017  Obesity (BMI 30.0-34.9) 03/26/2017   Irritable 10/17/2016   Chronic left-sided low back pain 09/19/2016   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HCC) 12/06/2015   Deafness in left ear 12/06/2015   Hashimoto's thyroiditis 06/14/2015   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   Hypokalemia 02/13/2015   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HCC) 02/11/2015   HSV infection 08/26/2014   ADD (attention deficit disorder) 05/11/2014   Chronic constipation 05/11/2014   Essential (primary) hypertension 05/11/2014   Gastro-esophageal reflux disease without esophagitis 05/11/2014   Asthma, mild persistent 05/11/2014   Interdigital neuralgia of foot 04/14/2013   Iron deficiency anemia, unspecified 02/18/2013    PCP: Jomarie Longs, PA-C   REFERRING PROVIDER: Donalee Citrin, MD    REFERRING DIAG:  M54.40 (ICD-10-CM) - Lumbago with sciatica, unspecified side  M54.12 (ICD-10-CM) - Cervical radiculopathy at C6   THERAPY DIAG:  Radiculopathy, cervical region  Chronic bilateral low back pain with left-sided sciatica  Abnormal posture  Muscle weakness (generalized)  Other muscle spasm  RATIONALE FOR EVALUATION AND TREATMENT: Rehabilitation  ONSET DATE: ~6 months for neck pain, chronic LBP  NEXT MD VISIT: Unknown   SUBJECTIVE:                                                                                                                                                                                                         SUBJECTIVE STATEMENT: Pt reports her neck continues to improve but while standing for longer periods she notices that she still gets sciatic pain down LLE.  PAIN: Are you having pain? Yes: NPRS scale: 0/10 Pain location: neck Pain description: occasional tingling into L UE Aggravating factors: variable - sometimes with sitting, also when walking in the park Relieving factors: nothing  Are you having pain? Yes: NPRS scale: 5 when standing for longer periods/10 Pain location: low back with L sciatic pain and tingling into distal LE Pain description: minimal to no low back pain, sciatic pain when standing Aggravating factors: standing for sciatic pain Relieving factors: injections, Tiger Balm patches, heat, exercises from prior PT HEP  PERTINENT HISTORY:  Acoustic neuroma - deafness in L ear, ADD, asthma, balance problem (after acoustic neuroma), chronic back pain, GERD, DM, orthostatic hypotension, Hashimoto's thyroiditis  PRECAUTIONS: None  HAND DOMINANCE: Right  RED FLAGS: None  WEIGHT BEARING RESTRICTIONS: No  FALLS:  Has patient fallen in last 6 months? Yes. 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 Advanced degenerative foraminal impingement on the left at C5-6. 2. Moderate degenerative foraminal narrowing on the right at C6-7. 3. Diffusely patent spinal canal.  01/20/23 - MRI Lumbar spine: IMPRESSION: 1. Generalized lumbar spine degeneration greatest at L5-S1. No significant change when compared to 2022. 2. L5-S1 left foraminal impingement. Noncompressive bilateral foraminal narrowing at L2-3. 3. Diffusely patent spinal canal.  PATIENT SURVEYS:  Modified Oswestry 10 / 50 = 20.0 %  NDI 9 / 50 = 18.0 %  SCREENING FOR RED FLAGS: Bowel or bladder incontinence: No Spinal tumors: No Cauda equina syndrome: No Compression fracture: No Abdominal aneurysm: No  COGNITION: Overall cognitive status: Within functional limits for tasks assessed     SENSATION: WFL Intermittent L UE numbness and tingling, L LE sciatica and tingling  MUSCLE LENGTH: Hamstrings: Mild tight B ITB: Mild tight B Piriformis: Mild tight B Hip IR: Mod/severe tight B Hip flexors: Mild/mod tight L>R Quads: WFL  POSTURE:  rounded shoulders, forward head, decreased lumbar lordosis, and increased thoracic kyphosis  PALPATION: TTP with increased muscle tension in L>R cervicothoracic and lumbar paraspinals, upper shoulder and periscapular muscles, as well as glutes/piriformis  CERVICAL ROM:   Active ROM Eval  Flexion 34 ^  Extension 44 - made tingling go away  Right  lateral flexion 30  Left lateral flexion 34  Right rotation 63  Left rotation 51 - triggered tingling    (Blank rows = not tested, ^ - increased pain)  UPPER EXTREMITY ROM:  Active ROM Right eval Left eval  Shoulder flexion 142 124  Shoulder extension 49 42  Shoulder abduction 161 148  Shoulder adduction    Shoulder internal rotation FIR WNL FIR WNL  Shoulder external rotation FER T3 FER T1  6 (Blank rows = not tested)  UPPER EXTREMITY MMT:  MMT Right eval Left eval  Shoulder flexion 4+ 4  Shoulder extension 4+ 4  Shoulder abduction 4+ 4  Shoulder adduction    Shoulder internal rotation 4+ 4-  Shoulder external rotation 4 4-  Middle trapezius 4- 4-  Lower trapezius 3+ 3+  (Blank rows = not tested)  LUMBAR ROM:   Active  Eval 04/04/23  Flexion Hands to mid shins Hands to lower shins  Extension 25% limited 15% limited  Right lateral flexion Hand to fibular head Fibular head  Left lateral flexion Hand to lateral knee Lateral knee  Right rotation WFL   Left rotation WFL     (Blank rows = not tested)  LOWER EXTREMITY ROM:    B LE AROM grossly WFL  LOWER EXTREMITY MMT:    MMT Right eval Left eval  Hip flexion 4 4-  Hip extension 4+ 4+  Hip abduction 4 3+  Hip adduction 4- 4  Hip internal rotation 5 4+  Hip external rotation 4 4-  Knee flexion 5 4+  Knee extension 5 4  Ankle dorsiflexion 5 5  Ankle plantarflexion    Ankle inversion    Ankle eversion      (  Blank rows = not tested)  FUNCTIONAL TESTS:  Functional gait assessment: 21/30, 19-24 = medium risk fall (03/22/23)   TODAY'S TREATMENT:  04/04/23 THERAPEUTIC EXERCISE: to improve flexibility, strength and mobility.  Demonstration, verbal and tactile cues throughout for technique.  UBE - L2.0 x 6 min (3' each fwd & back) Bridge then clamshell GTB 3x10 Clamshells GTB 2x10 bil Prone press ups 10x3" Superman x 10 Lumbar AROM assessed Standing hip abduction, hip extension x 10 GTB at knees; hip flexion x  10 GTB at ankles Seated sciatic nerve glide (slump) Seated and standing gastrco stretch   04/01/23 THERAPEUTIC EXERCISE: to improve flexibility, strength and mobility.  Demonstration, verbal and tactile cues throughout for technique.  UBE - L3.0 x 6 min (3' each fwd & back) L hooklying figure-4 piriformis stretch 2 x 30" L hooklying KTOS piriformis stretch 2 x 30" Quadruped fire hydrants 2 x 10 bil Standing L hip ABD isometric into ball on wall 10 x 5" Standing L hip ER isometric into ball on wall 10 x 5" Standing L glute medius stretch (figure-4 squat) with counter support 3 x 15-20"  MANUAL THERAPY: To promote normalized muscle tension, improved flexibility, and reduced pain. Skilled palpation and monitoring of soft tissue during DN Trigger Point Dry-Needling  Treatment instructions: Expect mild to moderate muscle soreness.  Patient verbalized understanding of these instructions and education. Patient Consent Given: Yes Education handout provided: Previously provided Muscles treated: L glute medius & minimus Electrical stimulation performed: No Parameters: N/A Treatment response/outcome: Twitch Response Elicited and Palpable Increase in Muscle Length STM/DTM, manual TPR and pin & stretch to muscles addressed with DN   03/28/23 THERAPEUTIC EXERCISE: to improve flexibility, strength and mobility.  Demonstration, verbal and tactile cues throughout for technique.  UBE - L2.5 x 6 min (3' each fwd & back) HEP review: - Standing ER back to ball RTB x 10 - cues to avoid dropping  -Standing horizontal ABD RTB anchored in door x 10  Shld ext + scap retraction RTB x 10 - cues to avoid shrugging shoulders Seated L piriformis stretch (hip hinge) 2x30" Supine wand shld flexion for thoracic ext x10; wand B arm horizontal ABD and ADD x 10 Seated wand shld flexion x 10  Seated rows 15# 2x10 low grips Leg press 20# 2x10 BLE Knee flexion 15# x 10 BLE   03/26/23 THERAPEUTIC EXERCISE: to  improve flexibility, strength and mobility.  Demonstration, verbal and tactile cues throughout for technique.  UBE - L2.5 x 6 min (3' each fwd & back) L hooklying figure-4 piriformis stretch 2 x 30" L hooklying KTOS piriformis stretch 2 x 30" Bridge + GTB alt hip ABD/ER x 10 S/L L GTB clam 10 x 3" STS + GTB hip ABD isometric x 10  MANUAL THERAPY: To promote normalized muscle tension, improved flexibility, and reduced pain. Skilled palpation and monitoring of soft tissue during DN Trigger Point Dry-Needling  Treatment instructions: Expect mild to moderate muscle soreness.  Patient verbalized understanding of these instructions and education. Patient Consent Given: Yes Education handout provided: Previously provided Muscles treated: L glute medius & minimus, piriformis; L UT and subscapularis Electrical stimulation performed: No Parameters: N/A Treatment response/outcome: Twitch Response Elicited and Palpable Increase in Muscle Length STM/DTM, manual TPR and pin & stretch to muscles addressed with DN   PATIENT EDUCATION:  Education details: role of DN and DN rational, procedure, outcomes, potential side effects, and recommended post-treatment exercises/activity   Person educated: Patient Education method: Explanation, Verbal cues, and  MedBridgeGO app updated Education comprehension: verbalized understanding  HOME EXERCISE PROGRAM: *Access Code: [email or web address] URL: [email or web address] Date: 03/22/2023 Prepared by: Glenetta Hew  Exercises - Hooklying Single Leg Bent Knee Fallouts with Resistance  - 1 x daily - 7 x weekly - 2 sets - 10 reps - 3 sec hold - Bridge with Hip Abduction and Resistance  - 1 x daily - 7 x weekly - 2 sets - 10 reps - 5 sec hold - Clam with Resistance  - 1 x daily - 7 x weekly - 2 sets - 10 reps - 3-5 sec hold - Cat Cow  - 2 x daily - 7 x weekly - 2 sets - 10 reps - 5 sec hold - Quadruped Alternating Leg Extensions  - 1 x daily - 7 x weekly - 2 sets  - 10 reps - 3 sec hold - Cervical Extension AROM with Strap  - 1 x daily - 7 x weekly - 2 sets - 10 reps - Seated Assisted Cervical Rotation with Towel  - 1 x daily - 7 x weekly - 2 sets - 10 reps - Shoulder External Rotation and Scapular Retraction with Resistance  - 1 x daily - 3 x weekly - 2 sets - 10 reps - Standing Shoulder Horizontal Abduction with Resistance  - 1 x daily - 3 x weekly - 2 sets - 10 reps - Supine Figure 4 Piriformis Stretch (Mirrored)  - 2 x daily - 7 x weekly - 3 reps - 30 sec hold - Supine Piriformis Stretch with Foot on Ground (Mirrored)  - 2 x daily - 7 x weekly - 3 reps - 30 sec hold  Patient Education - Trigger Point Dry Needling  *Patient using MedBridgeGO app   ASSESSMENT:  CLINICAL IMPRESSION: Pt reports sciatic LBP with standing for 5-10 min. Emphasized extension based exercises today to reduce nerve tension and sciatic pain. Pt shows improvements in lumbar flexion and extension AROM. She c/o tightness in her L calf so we added a gastroc stretch to HEP.  Harmani will benefit from continued skilled PT to address ongoing deficits to improve mobility and activity tolerance with decreased pain interference.   OBJECTIVE IMPAIRMENTS: Abnormal gait, decreased activity tolerance, decreased balance, decreased coordination, decreased endurance, decreased knowledge of condition, decreased mobility, difficulty walking, decreased ROM, decreased strength, dizziness, increased fascial restrictions, impaired perceived functional ability, increased muscle spasms, impaired flexibility, impaired sensation, impaired UE functional use, improper body mechanics, postural dysfunction, and pain.   ACTIVITY LIMITATIONS: carrying, lifting, bending, squatting, sleeping, transfers, bed mobility, reach over head, and locomotion level  PARTICIPATION LIMITATIONS: meal prep, cleaning, laundry, driving, shopping, community activity, and occupation  PERSONAL FACTORS: Fitness, Past/current  experiences, Time since onset of injury/illness/exacerbation, and 3+ comorbidities: Acoustic neuroma - deafness in L ear, ADD, asthma, balance problem (after acoustic neuroma), chronic back pain, GERD, DM, orthostatic hypotension, Hashimoto's thyroiditis  are also affecting patient's functional outcome.   REHAB POTENTIAL: Good  CLINICAL DECISION MAKING: Evolving/moderate complexity  EVALUATION COMPLEXITY: Moderate   GOALS: Goals reviewed with patient? Yes  SHORT TERM GOALS: Target date: 04/10/2023  Patient will be independent with initial HEP to improve outcomes and carryover.  Baseline: Initial HEP provided 03/18/2023 Goal status: MET - 03/26/23   2.  Patient will report reduced frequency and intensity and/or centralization of radicular symptoms.  Baseline: L UE radicular numbness and tingling into radial side of hand, L>R LE radicular pain and tingling into distal LE Goal status: IN PROGRESS - 03/28/23 - Pt  reports less frequency of N/T   3.  Complete FGA. Baseline: TBA Goal status: MET  03/22/23  LONG TERM GOALS: Target date: 05/08/2023  Patient will be independent with ongoing/advanced HEP for self-management at home.  Baseline:  Goal status: IN PROGRESS  2.  Patient will report 50-75% improvement in neck and back pain as well as associated radicular pain/numbness/tingling to improve QOL.  Baseline: L>R neck & upper shoulder up to 5/10 with frequent L UE radicular numbness and tingling into radial side of hand, LBP up to 8/10 with L>R LE radicular pain and tingling into distal LE Goal status: IN PROGRESS  3.  Patient will demonstrate improved posture to decrease muscle imbalance. Baseline: Severely forward head and rounded shoulder posture with increased thoracic kyphosis and flattening/reversal of lumbar lordosis Goal status: IN PROGRESS  4.  Patient to demonstrate ability to achieve and maintain good spinal alignment and body mechanics needed for daily  activities. Baseline:  Goal status: IN PROGRESS  5.  Patient will demonstrate functional pain free cervical ROM for safety with driving.  Baseline: Refer to above cervical ROM table Goal status: IN PROGRESS  6.  Patient will demonstrate functional pain free lumbar ROM to perform ADLs.   Baseline: Refer to above lumbar ROM table Goal status: IN PROGRESS- 04/04/23 improvement with flex and ext  7.  Patient will demonstrate improved functional strength as demonstrated by overall B proximal UE and LE MMT >/= 4+/5. Baseline: Refer to above UE and LE MMT tables Goal status: IN PROGRESS  8.  Patient will report </= 8% on modified Oswestry to demonstrate improved functional ability.  Baseline: 10 / 50 = 20.0 % Goal status: IN PROGRESS   9.  Patient will report </= 5% on NDI to demonstrate improved functional ability.  Baseline: 9 / 50 = 18.0 % Goal status: IN PROGRESS  10.  Patient to report ability to perform ADLs, household, and work-related tasks without limitation due to neck or back pain, radiculopathy, LOM or weakness. Baseline:  Goal status: IN PROGRESS   11.  Patient will improve FGA score to >/= 25/30 to improve gait stability and reduce risk for falls. Baseline: 21/30 Goal status: IN PROGRESS    PLAN:  PT FREQUENCY: 2x/week  PT DURATION: 8 weeks  PLANNED INTERVENTIONS: Therapeutic exercises, Therapeutic activity, Neuromuscular re-education, Balance training, Gait training, Patient/Family education, Self Care, Joint mobilization, DME instructions, Aquatic Therapy, Dry Needling, Electrical stimulation, Spinal manipulation, Spinal mobilization, Cryotherapy, Moist heat, Taping, Traction, Ultrasound, Manual therapy, and Re-evaluation  PLAN FOR NEXT SESSION: review HEP if needed; gentle cervical stretching/ROM and postural training; posture and body mechanics education including review of proper desk setup; MT +/- DN to address abnormal muscle tension and pain  Espiridion Supinski L Phelan Goers,  PTA 04/04/2023, 8:58 AM

## 2023-04-05 MED ORDER — NALTREXONE-BUPROPION HCL ER 8-90 MG PO TB12: 2.0000 | ORAL_TABLET | Freq: Two times a day (BID) | ORAL | 2 refills | Status: DC

## 2023-04-08 ENCOUNTER — Encounter: Payer: Self-pay | Admitting: Physical Therapy

## 2023-04-08 ENCOUNTER — Ambulatory Visit: Payer: BC Managed Care – PPO | Admitting: Physical Therapy

## 2023-04-08 DIAGNOSIS — R293 Abnormal posture: Secondary | ICD-10-CM

## 2023-04-08 DIAGNOSIS — M5412 Radiculopathy, cervical region: Secondary | ICD-10-CM | POA: Diagnosis not present

## 2023-04-08 DIAGNOSIS — M62838 Other muscle spasm: Secondary | ICD-10-CM

## 2023-04-08 DIAGNOSIS — M6281 Muscle weakness (generalized): Secondary | ICD-10-CM

## 2023-04-08 DIAGNOSIS — G8929 Other chronic pain: Secondary | ICD-10-CM

## 2023-04-08 NOTE — Therapy (Signed)
OUTPATIENT PHYSICAL THERAPY TREATMENT   Patient Name: Cynthia [name] MRN: [id number] DOB:[birth year], 62 y.o., female Today's Date: 04/08/2023   END OF SESSION:  PT End of Session - 04/08/23 0804     Visit Number 8    Date for PT Re-Evaluation 05/08/23    Authorization Type [employer]    PT Start Time 0804    PT Stop Time 0846    PT Time Calculation (min) 42 min    Activity Tolerance Patient tolerated treatment well    Behavior During Therapy [facility] for tasks assessed/performed                    Past Medical History:  Diagnosis Date   Acoustic neuroma (HCC)    ADD (attention deficit disorder)    and OCD-on Vyvanse   Asthma    w/out status asthmaticus   Awareness under anesthesia    Balance problem    after acoustic neuroma excision   Chronic back pain    Complication of anesthesia    unrecognized difficult intubation   Constipation, chronic    IBS, CIC   Difficult airway    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   Acustic Neuroma - Brain tumor    GERD (gastroesophageal reflux disease)    History of diabetes mellitus    was prediabetes, off metformin   Iron deficiency anemia, unspecified 02/18/2013   Obesity    296 lbs (highest weight in 2012)    Tendonitis, Achilles, left    Past Surgical History:  Procedure Laterality Date   ABLATION     and bladder sling   ACHILLES TENDON REPAIR     x2   ANAL FISSURE REPAIR  2012   BACK SURGERY     CHOLECYSTECTOMY     COLONOSCOPY WITH PROPOFOL N/A 01/20/2021   Procedure: COLONOSCOPY WITH PROPOFOL;  Surgeon: Jeani Hawking, MD;  Location: [facility];  Service: Endoscopy;  Laterality: N/A;   CRANIOTOMY  09/13/2015   with excision of acoustic neuroma   ESOPHAGOGASTRODUODENOSCOPY (EGD) WITH PROPOFOL N/A 07/22/2020   Procedure: ESOPHAGOGASTRODUODENOSCOPY (EGD) WITH PROPOFOL;  Surgeon: Jeani Hawking, MD;  Location: [facility];  Service: Endoscopy;  Laterality: N/A;   IMPLANTATION BONE ANCHORED HEARING AID     MOUTH SURGERY  2015   3 implants    OVARIAN CYST REMOVAL     x4   THYROID LOBECTOMY Right 04/28/2015   Partial    TOE FUSION Left    Patient Active Problem List   Diagnosis Date Noted   Class 1 obesity due to excess calories without serious comorbidity with body mass index (BMI) of 30.0 to 30.9 in adult 03/08/2023   Non-restorative sleep 03/08/2023   Post-viral cough syndrome 05/30/2022   Strain of lumbar region 02/05/2022   No energy 11/29/2021   History of sleep apnea 11/29/2021   Orthostatic hypotension 08/14/2021   Irritation of left radial nerve 03/16/2021   Right-sided chest wall pain 11/28/2020   History of endometrial ablation 10/28/2020   History of partial thyroidectomy 10/28/2020   Chronic gastritis without bleeding 05/27/2020   Hiatal hernia 05/27/2020   Nausea 05/27/2020   Tremor of right hand 02/26/2020   Hearing loss of right ear 11/24/2019   Basal cell carcinoma of left upper arm 10/21/2019   Atrophic vaginitis 06/03/2019   Family history of osteoporosis 03/05/2019   Post-menopausal 03/05/2019   Motion sickness 12/26/2017   Compression of right radial nerve 06/26/2017   Fatty liver disease, nonalcoholic 03/26/2017   Left renal mass 03/26/2017  Obesity (BMI 30.0-34.9) 03/26/2017   Irritable 10/17/2016   Chronic left-sided low back pain 09/19/2016   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HCC) 12/06/2015   Deafness in left ear 12/06/2015   Hashimoto's thyroiditis 06/14/2015   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   Hypokalemia 02/13/2015   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HCC) 02/11/2015   HSV infection 08/26/2014   ADD (attention deficit disorder) 05/11/2014   Chronic constipation 05/11/2014   Essential (primary) hypertension 05/11/2014   Gastro-esophageal reflux disease without esophagitis 05/11/2014   Asthma, mild persistent 05/11/2014   Interdigital neuralgia of foot 04/14/2013   Iron deficiency anemia, unspecified 02/18/2013    PCP: Jomarie Longs, PA-C   REFERRING PROVIDER: Donalee Citrin, MD    REFERRING DIAG:  M54.40 (ICD-10-CM) - Lumbago with sciatica, unspecified side  M54.12 (ICD-10-CM) - Cervical radiculopathy at C6   THERAPY DIAG:  Radiculopathy, cervical region  Chronic bilateral low back pain with left-sided sciatica  Abnormal posture  Muscle weakness (generalized)  Other muscle spasm  RATIONALE FOR EVALUATION AND TREATMENT: Rehabilitation  ONSET DATE: ~6 months for neck pain, chronic LBP  NEXT MD VISIT: Unknown   SUBJECTIVE:                                                                                                                                                                                                         SUBJECTIVE STATEMENT: Pt reports her neck, arm and back feels better, but she continues to experience the sciatica when she stands for long periods of time.    PAIN: Are you having pain? Yes: NPRS scale: 0/10 Pain location: neck Pain description: occasional tingling into L UE Aggravating factors: variable - sometimes with sitting, also when walking in the park Relieving factors: nothing  Are you having pain? Yes: NPRS scale: when standing for longer periods 5-6/10 Pain location: L sciatic pain and tingling into distal LE Pain description: minimal to no low back pain, sciatic pain when standing Aggravating factors: standing for sciatic pain Relieving factors: injections, Tiger Balm patches, heat, exercises from prior PT HEP  PERTINENT HISTORY:  Acoustic neuroma - deafness in L ear, ADD, asthma, balance problem (after acoustic neuroma), chronic back pain, GERD, DM, orthostatic hypotension, Hashimoto's thyroiditis  PRECAUTIONS: None  HAND DOMINANCE: Right  RED FLAGS: None  WEIGHT BEARING RESTRICTIONS: No  FALLS:  Has patient fallen in last 6 months? Yes. 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 Advanced degenerative foraminal impingement on the left at C5-6. 2. Moderate degenerative foraminal narrowing on the right at C6-7. 3. Diffusely patent spinal canal.  01/20/23 - MRI Lumbar spine: IMPRESSION: 1. Generalized lumbar spine degeneration greatest at L5-S1. No significant change when compared to 2022. 2. L5-S1 left foraminal impingement. Noncompressive bilateral foraminal narrowing at L2-3. 3. Diffusely patent spinal canal.  PATIENT SURVEYS:  Modified Oswestry 10 / 50 = 20.0 %  NDI 9 / 50 = 18.0 %  SCREENING FOR RED FLAGS: Bowel or bladder incontinence: No Spinal tumors: No Cauda equina syndrome: No Compression fracture: No Abdominal aneurysm: No  COGNITION: Overall cognitive status: Within functional limits for tasks assessed     SENSATION: WFL Intermittent L UE numbness and tingling, L LE sciatica and tingling  MUSCLE LENGTH: Hamstrings: Mild tight B ITB: Mild tight B Piriformis: Mild tight B Hip IR: Mod/severe tight B Hip flexors: Mild/mod tight L>R Quads: WFL  POSTURE:  rounded shoulders, forward head, decreased lumbar lordosis, and increased thoracic kyphosis  PALPATION: TTP with increased muscle tension in L>R cervicothoracic and lumbar paraspinals, upper shoulder and periscapular muscles, as well as glutes/piriformis  CERVICAL ROM:   Active ROM Eval  Flexion 34 ^  Extension 44 - made tingling go away  Right  lateral flexion 30  Left lateral flexion 34  Right rotation 63  Left rotation 51 - triggered tingling    (Blank rows = not tested, ^ - increased pain)  UPPER EXTREMITY ROM:  Active ROM Right eval Left eval  Shoulder flexion 142 124  Shoulder extension 49 42  Shoulder abduction 161 148  Shoulder adduction    Shoulder internal rotation FIR WNL FIR WNL  Shoulder external rotation FER T3 FER T1  6 (Blank rows = not tested)  UPPER EXTREMITY MMT:  MMT Right eval Left eval  Shoulder flexion 4+ 4  Shoulder extension 4+ 4  Shoulder abduction 4+ 4  Shoulder adduction    Shoulder internal rotation 4+ 4-  Shoulder external rotation 4 4-  Middle trapezius 4- 4-  Lower trapezius 3+ 3+  (Blank rows = not tested)  LUMBAR ROM:   Active  Eval 04/04/23  Flexion Hands to mid shins Hands to lower shins  Extension 25% limited 15% limited  Right lateral flexion Hand to fibular head Fibular head  Left lateral flexion Hand to lateral knee Lateral knee  Right rotation WFL   Left rotation WFL     (Blank rows = not tested)  LOWER EXTREMITY ROM:    B LE AROM grossly WFL  LOWER EXTREMITY MMT:    MMT Right eval Left eval  Hip flexion 4 4-  Hip extension 4+ 4+  Hip abduction 4 3+  Hip adduction 4- 4  Hip internal rotation 5 4+  Hip external rotation 4 4-  Knee flexion 5 4+  Knee extension 5 4  Ankle dorsiflexion 5 5  Ankle plantarflexion    Ankle inversion    Ankle eversion      (  Blank rows = not tested)  FUNCTIONAL TESTS:  Functional gait assessment: 21/30, 19-24 = medium risk fall (03/22/23)   TODAY'S TREATMENT:   04/08/23 THERAPEUTIC EXERCISE: to improve flexibility, strength and mobility.  Demonstration, verbal and tactile cues throughout for technique.  UBE - L3.0 x 6 min (3' each fwd & back) L hooklying KTOS piriformis stretch 2 x 30" L hooklying figure-4 piriformis stretch 2 x 30" L seated KTOS piriformis stretch x 30" L seated hip hinge figure-4 piriformis stretch x  30" Standing L hip ABD isometric into ball on wall 10 x 5" Standing L hip ER isometric into ball on wall 10 x 5"  MANUAL THERAPY: To promote normalized muscle tension, improved flexibility, and reduced pain. Skilled palpation and monitoring of soft tissue during DN Trigger Point Dry-Needling  Treatment instructions: Expect mild to moderate muscle soreness. Patient verbalized understanding of these instructions and education. Patient Consent Given: Yes Education handout provided: Previously provided Muscles treated: L glute medius & minimus, medial & lateral piriformis Electrical stimulation performed: No Parameters: N/A Treatment response/outcome: Twitch Response Elicited and Palpable Increase in Muscle Length STM/DTM, manual TPR and pin & stretch to muscles addressed with DN   04/04/23 THERAPEUTIC EXERCISE: to improve flexibility, strength and mobility.  Demonstration, verbal and tactile cues throughout for technique.  UBE - L2.0 x 6 min (3' each fwd & back) Bridge then clamshell GTB 3x10 Clamshells GTB 2x10 bil Prone press ups 10x3" Superman x 10 Lumbar AROM assessed Standing hip abduction, hip extension x 10 GTB at knees; hip flexion x 10 GTB at ankles Seated sciatic nerve glide (slump) Seated and standing gastrco stretch    04/01/23 THERAPEUTIC EXERCISE: to improve flexibility, strength and mobility.  Demonstration, verbal and tactile cues throughout for technique.  UBE - L3.0 x 6 min (3' each fwd & back) L hooklying figure-4 piriformis stretch 2 x 30" L hooklying KTOS piriformis stretch 2 x 30" Quadruped fire hydrants 2 x 10 bil Standing L hip ABD isometric into ball on wall 10 x 5" Standing L hip ER isometric into ball on wall 10 x 5" Standing L glute medius stretch (figure-4 squat) with counter support 3 x 15-20"  MANUAL THERAPY: To promote normalized muscle tension, improved flexibility, and reduced pain. Skilled palpation and monitoring of soft tissue during  DN Trigger Point Dry-Needling  Treatment instructions: Expect mild to moderate muscle soreness.  Patient verbalized understanding of these instructions and education. Patient Consent Given: Yes Education handout provided: Previously provided Muscles treated: L glute medius & minimus Electrical stimulation performed: No Parameters: N/A Treatment response/outcome: Twitch Response Elicited and Palpable Increase in Muscle Length STM/DTM, manual TPR and pin & stretch to muscles addressed with DN   PATIENT EDUCATION:  Education details: role of DN, DN rational, procedure, outcomes, potential side effects, and recommended post-treatment exercises/activity, and continue HEP    Person educated: Patient Education method: Explanation, Verbal cues, and MedBridgeGO app updated Education comprehension: verbalized understanding  HOME EXERCISE PROGRAM: *Access Code: [email or web address] URL: [email or web address] Date: 03/22/2023 Prepared by: Glenetta Hew  Exercises - Hooklying Single Leg Bent Knee Fallouts with Resistance  - 1 x daily - 7 x weekly - 2 sets - 10 reps - 3 sec hold - Bridge with Hip Abduction and Resistance  - 1 x daily - 7 x weekly - 2 sets - 10 reps - 5 sec hold - Clam with Resistance  - 1 x daily - 7 x weekly - 2 sets - 10 reps - 3-5 sec hold -  Cat Cow  - 2 x daily - 7 x weekly - 2 sets - 10 reps - 5 sec hold - Quadruped Alternating Leg Extensions  - 1 x daily - 7 x weekly - 2 sets - 10 reps - 3 sec hold - Cervical Extension AROM with Strap  - 1 x daily - 7 x weekly - 2 sets - 10 reps - Seated Assisted Cervical Rotation with Towel  - 1 x daily - 7 x weekly - 2 sets - 10 reps - Shoulder External Rotation and Scapular Retraction with Resistance  - 1 x daily - 3 x weekly - 2 sets - 10 reps - Standing Shoulder Horizontal Abduction with Resistance  - 1 x daily - 3 x weekly - 2 sets - 10 reps - Supine Figure 4 Piriformis Stretch (Mirrored)  - 2 x daily - 7 x weekly - 3 reps - 30 sec  hold - Supine Piriformis Stretch with Foot on Ground (Mirrored)  - 2 x daily - 7 x weekly - 3 reps - 30 sec hold  Patient Education - Trigger Point Dry Needling  *Patient using MedBridgeGO app   ASSESSMENT:  CLINICAL IMPRESSION: Noralynn reports 50% improvement in L UE and 100% improvement in low back, but L sciatica seems worse with prolonged standing although better when walking.  L LE radicular symptoms appearing to continue to originate from the glutes & piriformis with DN to these muscles able to reproduce her radicular symptoms. Continued stretching and strengthening targeting treated muscles to further normalize muscle activation.  Pt instructed in seated alternatives for the glute and piriformis stretches for her to use prior to walking for exercise.  She was also instructed in seated self-STM for piriformis with tennis ball in chair seat.  Neave will benefit from continued skilled PT to address ongoing deficits to improve mobility and activity tolerance with decreased pain interference.   OBJECTIVE IMPAIRMENTS: Abnormal gait, decreased activity tolerance, decreased balance, decreased coordination, decreased endurance, decreased knowledge of condition, decreased mobility, difficulty walking, decreased ROM, decreased strength, dizziness, increased fascial restrictions, impaired perceived functional ability, increased muscle spasms, impaired flexibility, impaired sensation, impaired UE functional use, improper body mechanics, postural dysfunction, and pain.   ACTIVITY LIMITATIONS: carrying, lifting, bending, squatting, sleeping, transfers, bed mobility, reach over head, and locomotion level  PARTICIPATION LIMITATIONS: meal prep, cleaning, laundry, driving, shopping, community activity, and occupation  PERSONAL FACTORS: Fitness, Past/current experiences, Time since onset of injury/illness/exacerbation, and 3+ comorbidities: Acoustic neuroma - deafness in L ear, ADD, asthma, balance problem  (after acoustic neuroma), chronic back pain, GERD, DM, orthostatic hypotension, Hashimoto's thyroiditis  are also affecting patient's functional outcome.   REHAB POTENTIAL: Good  CLINICAL DECISION MAKING: Evolving/moderate complexity  EVALUATION COMPLEXITY: Moderate   GOALS: Goals reviewed with patient? Yes  SHORT TERM GOALS: Target date: 04/10/2023  Patient will be independent with initial HEP to improve outcomes and carryover.  Baseline: Initial HEP provided 03/18/2023 Goal status: MET - 03/26/23   2.  Patient will report reduced frequency and intensity and/or centralization of radicular symptoms.  Baseline: L UE radicular numbness and tingling into radial side of hand, L>R LE radicular pain and tingling into distal LE Goal status: PARTIALLY MET - 04/08/23 - Met for UE, but continued L sciatica when standing for long periods   3.  Complete FGA. Baseline: TBA Goal status: MET - 03/22/23  LONG TERM GOALS: Target date: 05/08/2023  Patient will be independent with ongoing/advanced HEP for self-management at home.  Baseline:  Goal  status: IN PROGRESS  2.  Patient will report 50-75% improvement in neck and back pain as well as associated radicular pain/numbness/tingling to improve QOL.  Baseline: L>R neck & upper shoulder up to 5/10 with frequent L UE radicular numbness and tingling into radial side of hand, LBP up to 8/10 with L>R LE radicular pain and tingling into distal LE Goal status: IN PROGRESS - 04/08/23 - 50% improvement in L UE, 100% in low back but L sciatica seems worse  3.  Patient will demonstrate improved posture to decrease muscle imbalance. Baseline: Severely forward head and rounded shoulder posture with increased thoracic kyphosis and flattening/reversal of lumbar lordosis Goal status: IN PROGRESS  4.  Patient to demonstrate ability to achieve and maintain good spinal alignment and body mechanics needed for daily activities. Baseline:  Goal status: IN  PROGRESS  5.  Patient will demonstrate functional pain free cervical ROM for safety with driving.  Baseline: Refer to above cervical ROM table Goal status: IN PROGRESS  6.  Patient will demonstrate functional pain free lumbar ROM to perform ADLs.   Baseline: Refer to above lumbar ROM table Goal status: IN PROGRESS- 04/04/23 improvement with flex and ext  7.  Patient will demonstrate improved functional strength as demonstrated by overall B proximal UE and LE MMT >/= 4+/5. Baseline: Refer to above UE and LE MMT tables Goal status: IN PROGRESS  8.  Patient will report </= 8% on modified Oswestry to demonstrate improved functional ability.  Baseline: 10 / 50 = 20.0 % Goal status: IN PROGRESS   9.  Patient will report </= 5% on NDI to demonstrate improved functional ability.  Baseline: 9 / 50 = 18.0 % Goal status: IN PROGRESS  10.  Patient to report ability to perform ADLs, household, and work-related tasks without limitation due to neck or back pain, radiculopathy, LOM or weakness. Baseline:  Goal status: IN PROGRESS   11.  Patient will improve FGA score to >/= 25/30 to improve gait stability and reduce risk for falls. Baseline: 21/30 Goal status: IN PROGRESS    PLAN:  PT FREQUENCY: 2x/week  PT DURATION: 8 weeks  PLANNED INTERVENTIONS: Therapeutic exercises, Therapeutic activity, Neuromuscular re-education, Balance training, Gait training, Patient/Family education, Self Care, Joint mobilization, DME instructions, Aquatic Therapy, Dry Needling, Electrical stimulation, Spinal manipulation, Spinal mobilization, Cryotherapy, Moist heat, Taping, Traction, Ultrasound, Manual therapy, and Re-evaluation  PLAN FOR NEXT SESSION: reassess LE strength; review HEP if needed; gentle cervical stretching/ROM and postural training; posture and body mechanics education including review of proper desk setup; MT +/- DN to address abnormal muscle tension and pain  Marry Guan, PT 04/08/2023,  10:21 AM

## 2023-04-11 ENCOUNTER — Ambulatory Visit: Payer: BC Managed Care – PPO

## 2023-04-11 DIAGNOSIS — M6281 Muscle weakness (generalized): Secondary | ICD-10-CM

## 2023-04-11 DIAGNOSIS — G8929 Other chronic pain: Secondary | ICD-10-CM

## 2023-04-11 DIAGNOSIS — M5412 Radiculopathy, cervical region: Secondary | ICD-10-CM | POA: Diagnosis not present

## 2023-04-11 DIAGNOSIS — M62838 Other muscle spasm: Secondary | ICD-10-CM

## 2023-04-11 DIAGNOSIS — R293 Abnormal posture: Secondary | ICD-10-CM

## 2023-04-11 NOTE — Therapy (Signed)
OUTPATIENT PHYSICAL THERAPY TREATMENT   Patient Name: Cynthia [name] MRN: [id number] DOB:11-02-1960, 62 y.o., female Today's Date: 04/11/2023   END OF SESSION:  PT End of Session - 04/11/23 0832     Visit Number 9    Date for PT Re-Evaluation 05/08/23    Authorization Type [employer]    PT Start Time 0802    PT Stop Time 0844    PT Time Calculation (min) 42 min    Activity Tolerance Patient tolerated treatment well    Behavior During Therapy [facility] for tasks assessed/performed                     Past Medical History:  Diagnosis Date   Acoustic neuroma (HCC)    ADD (attention deficit disorder)    and OCD-on Vyvanse   Asthma    w/out status asthmaticus   Awareness under anesthesia    Balance problem    after acoustic neuroma excision   Chronic back pain    Complication of anesthesia    unrecognized difficult intubation   Constipation, chronic    IBS, CIC   Difficult airway    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   Acustic Neuroma - Brain tumor    GERD (gastroesophageal reflux disease)    History of diabetes mellitus    was prediabetes, off metformin   Iron deficiency anemia, unspecified 02/18/2013   Obesity    296 lbs (highest weight in 2012)    Tendonitis, Achilles, left    Past Surgical History:  Procedure Laterality Date   ABLATION     and bladder sling   ACHILLES TENDON REPAIR     x2   ANAL FISSURE REPAIR  2012   BACK SURGERY     CHOLECYSTECTOMY     COLONOSCOPY WITH PROPOFOL N/A 01/20/2021   Procedure: COLONOSCOPY WITH PROPOFOL;  Surgeon: Jeani Hawking, MD;  Location: [facility];  Service: Endoscopy;  Laterality: N/A;   CRANIOTOMY  09/13/2015   with excision of acoustic neuroma   ESOPHAGOGASTRODUODENOSCOPY (EGD) WITH PROPOFOL N/A 07/22/2020   Procedure: ESOPHAGOGASTRODUODENOSCOPY (EGD) WITH PROPOFOL;  Surgeon: Jeani Hawking, MD;  Location: [facility];  Service: Endoscopy;  Laterality: N/A;   IMPLANTATION BONE ANCHORED HEARING AID     MOUTH SURGERY  2015   3 implants    OVARIAN CYST REMOVAL     x4   THYROID LOBECTOMY Right 04/28/2015   Partial    TOE FUSION Left    Patient Active Problem List   Diagnosis Date Noted   Class 1 obesity due to excess calories without serious comorbidity with body mass index (BMI) of 30.0 to 30.9 in adult 03/08/2023   Non-restorative sleep 03/08/2023   Post-viral cough syndrome 05/30/2022   Strain of lumbar region 02/05/2022   No energy 11/29/2021   History of sleep apnea 11/29/2021   Orthostatic hypotension 08/14/2021   Irritation of left radial nerve 03/16/2021   Right-sided chest wall pain 11/28/2020   History of endometrial ablation 10/28/2020   History of partial thyroidectomy 10/28/2020   Chronic gastritis without bleeding 05/27/2020   Hiatal hernia 05/27/2020   Nausea 05/27/2020   Tremor of right hand 02/26/2020   Hearing loss of right ear 11/24/2019   Basal cell carcinoma of left upper arm 10/21/2019   Atrophic vaginitis 06/03/2019   Family history of osteoporosis 03/05/2019   Post-menopausal 03/05/2019   Motion sickness 12/26/2017   Compression of right radial nerve 06/26/2017   Fatty liver disease, nonalcoholic 03/26/2017   Left renal mass 03/26/2017  Obesity (BMI 30.0-34.9) 03/26/2017   Irritable 10/17/2016   Chronic left-sided low back pain 09/19/2016   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HCC) 12/06/2015   Deafness in left ear 12/06/2015   Hashimoto's thyroiditis 06/14/2015   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   Hypokalemia 02/13/2015   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HCC) 02/11/2015   HSV infection 08/26/2014   ADD (attention deficit disorder) 05/11/2014   Chronic constipation 05/11/2014   Essential (primary) hypertension 05/11/2014   Gastro-esophageal reflux disease without esophagitis 05/11/2014   Asthma, mild persistent 05/11/2014   Interdigital neuralgia of foot 04/14/2013   Iron deficiency anemia, unspecified 02/18/2013    PCP: Jomarie Longs, PA-C   REFERRING PROVIDER: Donalee Citrin, MD    REFERRING DIAG:  M54.40 (ICD-10-CM) - Lumbago with sciatica, unspecified side  M54.12 (ICD-10-CM) - Cervical radiculopathy at C6   THERAPY DIAG:  Radiculopathy, cervical region  Chronic bilateral low back pain with left-sided sciatica  Abnormal posture  Muscle weakness (generalized)  Other muscle spasm  RATIONALE FOR EVALUATION AND TREATMENT: Rehabilitation  ONSET DATE: ~6 months for neck pain, chronic LBP  NEXT MD VISIT: Unknown   SUBJECTIVE:                                                                                                                                                                                                         SUBJECTIVE STATEMENT: Pt reports improved sciatic pain when standing, even though DN was "painful". Now her pain is only 2/10 when standing for long periods.  PAIN: Are you having pain? Yes: NPRS scale: 0/10 Pain location: neck Pain description: occasional tingling into L UE Aggravating factors: variable - sometimes with sitting, also when walking in the park Relieving factors: nothing  Are you having pain? Yes: NPRS scale: when standing for longer periods 2/10 Pain location: L sciatic pain and tingling into distal LE Pain description: minimal to no low back pain, sciatic pain when standing Aggravating factors: standing for sciatic pain Relieving factors: injections, Tiger Balm patches, heat, exercises from prior PT HEP  PERTINENT HISTORY:  Acoustic neuroma - deafness in L ear, ADD, asthma, balance problem (after acoustic neuroma), chronic back pain, GERD, DM, orthostatic hypotension, Hashimoto's thyroiditis  PRECAUTIONS: None  HAND DOMINANCE: Right  RED FLAGS: None  WEIGHT BEARING RESTRICTIONS: No  FALLS:  Has patient fallen in last 6 months? Yes. 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 Advanced degenerative foraminal impingement on the left at C5-6. 2. Moderate degenerative foraminal narrowing on the right at C6-7. 3. Diffusely patent spinal canal.  01/20/23 - MRI Lumbar spine: IMPRESSION: 1. Generalized lumbar spine degeneration greatest at L5-S1. No significant change when compared to 2022. 2. L5-S1 left foraminal impingement. Noncompressive bilateral foraminal narrowing at L2-3. 3. Diffusely patent spinal canal.  PATIENT SURVEYS:  Modified Oswestry 10 / 50 = 20.0 %  NDI 9 / 50 = 18.0 %  SCREENING FOR RED FLAGS: Bowel or bladder incontinence: No Spinal tumors: No Cauda equina syndrome: No Compression fracture: No Abdominal aneurysm: No  COGNITION: Overall cognitive status: Within functional limits for tasks assessed     SENSATION: WFL Intermittent L UE numbness and tingling, L LE sciatica and tingling  MUSCLE LENGTH: Hamstrings: Mild tight B ITB: Mild tight B Piriformis: Mild tight B Hip IR: Mod/severe tight B Hip flexors: Mild/mod tight L>R Quads: WFL  POSTURE:  rounded shoulders, forward head, decreased lumbar lordosis, and increased thoracic kyphosis  PALPATION: TTP with increased muscle tension in L>R cervicothoracic and lumbar paraspinals, upper shoulder and periscapular muscles, as well as glutes/piriformis  CERVICAL ROM:   Active ROM Eval  Flexion 34 ^  Extension 44 - made tingling go away  Right  lateral flexion 30  Left lateral flexion 34  Right rotation 63  Left rotation 51 - triggered tingling    (Blank rows = not tested, ^ - increased pain)  UPPER EXTREMITY ROM:  Active ROM Right eval Left eval  Shoulder flexion 142 124  Shoulder extension 49 42  Shoulder abduction 161 148  Shoulder adduction    Shoulder internal rotation FIR WNL FIR WNL  Shoulder external rotation FER T3 FER T1  6 (Blank rows = not tested)  UPPER EXTREMITY MMT:  MMT Right eval Left eval  Shoulder flexion 4+ 4  Shoulder extension 4+ 4  Shoulder abduction 4+ 4  Shoulder adduction    Shoulder internal rotation 4+ 4-  Shoulder external rotation 4 4-  Middle trapezius 4- 4-  Lower trapezius 3+ 3+  (Blank rows = not tested)  LUMBAR ROM:   Active  Eval 04/04/23  Flexion Hands to mid shins Hands to lower shins  Extension 25% limited 15% limited  Right lateral flexion Hand to fibular head Fibular head  Left lateral flexion Hand to lateral knee Lateral knee  Right rotation [facility]   Left rotation WFL     (Blank rows = not tested)  LOWER EXTREMITY ROM:    B LE AROM grossly [facility]  LOWER EXTREMITY MMT:    MMT Right eval Left eval R 04/11/23 L 04/11/23  Hip flexion 4 4- 4+ 4+  Hip extension 4+ 4+ 4+ 4+  Hip abduction 4 3+ 4+ 4-  Hip adduction 4- 4 4 4   Hip internal rotation 5 4+ 5 4+  Hip external rotation 4 4- 4+ 4  Knee flexion 5 4+    Knee extension 5 4 5  4+  Ankle dorsiflexion  5 5    Ankle plantarflexion      Ankle inversion      Ankle eversion        (Blank rows = not tested)  FUNCTIONAL TESTS:  Functional gait assessment: 21/30, 19-24 = medium risk fall (03/22/23)   TODAY'S TREATMENT:  04/11/23 THERAPEUTIC EXERCISE: to improve flexibility, strength and mobility.  Demonstration, verbal and tactile cues throughout for technique.  UBE - L3.0 x 6 min (3' each fwd & back) S/L R/L hip abduction x 10  S/L R/L clamshells Blue TB 2 x 10  Bridge with clamshell with blue TB 2 x 10   Standing hip hike from 6' step x 10 bil MMT for LE  04/08/23 THERAPEUTIC EXERCISE: to improve flexibility, strength and mobility.  Demonstration, verbal and tactile cues throughout for technique.  UBE - L3.0 x 6 min (3' each fwd & back) L hooklying KTOS piriformis stretch 2 x 30" L hooklying figure-4 piriformis stretch 2 x 30" L seated KTOS piriformis stretch x 30" L seated hip hinge figure-4 piriformis stretch x 30" Standing L hip ABD isometric into ball on wall 10 x 5" Standing L hip ER isometric into ball on wall 10 x 5"  MANUAL THERAPY: To promote normalized muscle tension, improved flexibility, and reduced pain. Skilled palpation and monitoring of soft tissue during DN Trigger Point Dry-Needling  Treatment instructions: Expect mild to moderate muscle soreness. Patient verbalized understanding of these instructions and education. Patient Consent Given: Yes Education handout provided: Previously provided Muscles treated: L glute medius & minimus, medial & lateral piriformis Electrical stimulation performed: No Parameters: N/A Treatment response/outcome: Twitch Response Elicited and Palpable Increase in Muscle Length STM/DTM, manual TPR and pin & stretch to muscles addressed with DN   04/04/23 THERAPEUTIC EXERCISE: to improve flexibility, strength and mobility.  Demonstration, verbal and tactile cues throughout for technique.  UBE - L2.0 x 6 min (3' each fwd & back) Bridge then clamshell GTB 3x10 Clamshells GTB 2x10 bil Prone press ups 10x3" Superman x 10 Lumbar AROM assessed Standing hip abduction, hip extension x 10 GTB at knees; hip flexion x 10 GTB at ankles Seated sciatic nerve glide (slump) Seated and standing gastrco stretch    04/01/23 THERAPEUTIC EXERCISE: to improve flexibility, strength and mobility.  Demonstration, verbal and tactile cues throughout for technique.  UBE - L3.0 x 6 min (3' each fwd & back) L hooklying figure-4 piriformis stretch 2 x 30" L  hooklying KTOS piriformis stretch 2 x 30" Quadruped fire hydrants 2 x 10 bil Standing L hip ABD isometric into ball on wall 10 x 5" Standing L hip ER isometric into ball on wall 10 x 5" Standing L glute medius stretch (figure-4 squat) with counter support 3 x 15-20"  MANUAL THERAPY: To promote normalized muscle tension, improved flexibility, and reduced pain. Skilled palpation and monitoring of soft tissue during DN Trigger Point Dry-Needling  Treatment instructions: Expect mild to moderate muscle soreness.  Patient verbalized understanding of these instructions and education. Patient Consent Given: Yes Education handout provided: Previously provided Muscles treated: L glute medius & minimus Electrical stimulation performed: No Parameters: N/A Treatment response/outcome: Twitch Response Elicited and Palpable Increase in Muscle Length STM/DTM, manual TPR and pin & stretch to muscles addressed with DN   PATIENT EDUCATION:  Education details: role of DN, DN rational, procedure, outcomes, potential side effects, and recommended post-treatment exercises/activity, and continue HEP    Person educated: Patient Education method: Explanation, Verbal cues, and MedBridgeGO app updated Education comprehension:  verbalized understanding  HOME EXERCISE PROGRAM: *Access Code: [email or web address] URL: [email or web address] Date: 04/11/2023 Prepared by: Verta Ellen  Exercises - Hooklying Single Leg Bent Knee Fallouts with Resistance  - 1 x daily - 7 x weekly - 2 sets - 10 reps - 3 sec hold - Bridge with Hip Abduction and Resistance  - 1 x daily - 7 x weekly - 2 sets - 10 reps - 5 sec hold - Clam with Resistance  - 1 x daily - 7 x weekly - 2 sets - 10 reps - 3-5 sec hold - Cat Cow  - 2 x daily - 7 x weekly - 2 sets - 10 reps - 5 sec hold - Quadruped Alternating Leg Extensions  - 1 x daily - 7 x weekly - 2 sets - 10 reps - 3 sec hold - Cervical Extension AROM with Strap  - 1 x daily - 7 x weekly  - 2 sets - 10 reps - Seated Assisted Cervical Rotation with Towel  - 1 x daily - 7 x weekly - 2 sets - 10 reps - Shoulder External Rotation and Scapular Retraction with Resistance  - 1 x daily - 3 x weekly - 2 sets - 10 reps - Standing Shoulder Horizontal Abduction with Resistance  - 1 x daily - 3 x weekly - 2 sets - 10 reps - Supine Figure 4 Piriformis Stretch (Mirrored)  - 2 x daily - 7 x weekly - 3 reps - 30 sec hold - Supine Piriformis Stretch with Foot on Ground (Mirrored)  - 2 x daily - 7 x weekly - 3 reps - 30 sec hold - Gastroc Stretch on Step  - 2 x daily - 7 x weekly - 2 sets - 30 sec hold - Sidelying Hip Abduction  - 1 x daily - 3 x weekly - 2 sets - 10 reps - Hip Hiking on Step  - 1 x daily - 3 x weekly - 2 sets - 10 reps  Patient Education - Trigger Point Dry Needling  *Patient using MedBridgeGO app   ASSESSMENT:  CLINICAL IMPRESSION: Pt reports improvement in standing tolerance with less sciatic pain. Strength test reveals improvement mostly but weakness for the most part in glute med and min. Strengthening focused on these deficits with HEP updates made accordingly. Progress is being made toward LTG #7. Cynthia [name] will benefit from continued skilled PT to address ongoing deficits to improve mobility and activity tolerance with decreased pain interference.   OBJECTIVE IMPAIRMENTS: Abnormal gait, decreased activity tolerance, decreased balance, decreased coordination, decreased endurance, decreased knowledge of condition, decreased mobility, difficulty walking, decreased ROM, decreased strength, dizziness, increased fascial restrictions, impaired perceived functional ability, increased muscle spasms, impaired flexibility, impaired sensation, impaired UE functional use, improper body mechanics, postural dysfunction, and pain.   ACTIVITY LIMITATIONS: carrying, lifting, bending, squatting, sleeping, transfers, bed mobility, reach over head, and locomotion level  PARTICIPATION  LIMITATIONS: meal prep, cleaning, laundry, driving, shopping, community activity, and occupation  PERSONAL FACTORS: Fitness, Past/current experiences, Time since onset of injury/illness/exacerbation, and 3+ comorbidities: Acoustic neuroma - deafness in L ear, ADD, asthma, balance problem (after acoustic neuroma), chronic back pain, GERD, DM, orthostatic hypotension, Hashimoto's thyroiditis  are also affecting patient's functional outcome.   REHAB POTENTIAL: Good  CLINICAL DECISION MAKING: Evolving/moderate complexity  EVALUATION COMPLEXITY: Moderate   GOALS: Goals reviewed with patient? Yes  SHORT TERM GOALS: Target date: 04/10/2023  Patient will be independent with initial HEP to improve outcomes and carryover.  Baseline: Initial HEP provided  03/18/2023 Goal status: MET - 03/26/23   2.  Patient will report reduced frequency and intensity and/or centralization of radicular symptoms.  Baseline: L UE radicular numbness and tingling into radial side of hand, L>R LE radicular pain and tingling into distal LE Goal status: PARTIALLY MET - 04/08/23 - Met for UE, but continued L sciatica when standing for long periods   3.  Complete FGA. Baseline: TBA Goal status: MET - 03/22/23  LONG TERM GOALS: Target date: 05/08/2023  Patient will be independent with ongoing/advanced HEP for self-management at home.  Baseline:  Goal status: IN PROGRESS  2.  Patient will report 50-75% improvement in neck and back pain as well as associated radicular pain/numbness/tingling to improve QOL.  Baseline: L>R neck & upper shoulder up to 5/10 with frequent L UE radicular numbness and tingling into radial side of hand, LBP up to 8/10 with L>R LE radicular pain and tingling into distal LE Goal status: IN PROGRESS - 04/08/23 - 50% improvement in L UE, 100% in low back but L sciatica seems worse  3.  Patient will demonstrate improved posture to decrease muscle imbalance. Baseline: Severely forward head and rounded  shoulder posture with increased thoracic kyphosis and flattening/reversal of lumbar lordosis Goal status: IN PROGRESS  4.  Patient to demonstrate ability to achieve and maintain good spinal alignment and body mechanics needed for daily activities. Baseline:  Goal status: IN PROGRESS  5.  Patient will demonstrate functional pain free cervical ROM for safety with driving.  Baseline: Refer to above cervical ROM table Goal status: IN PROGRESS  6.  Patient will demonstrate functional pain free lumbar ROM to perform ADLs.   Baseline: Refer to above lumbar ROM table Goal status: IN PROGRESS- 04/04/23 improvement with flex and ext  7.  Patient will demonstrate improved functional strength as demonstrated by overall B proximal UE and LE MMT >/= 4+/5. Baseline: Refer to above UE and LE MMT tables Goal status: IN PROGRESS- 04/11/23 refer to MMT table  8.  Patient will report </= 8% on modified Oswestry to demonstrate improved functional ability.  Baseline: 10 / 50 = 20.0 % Goal status: IN PROGRESS   9.  Patient will report </= 5% on NDI to demonstrate improved functional ability.  Baseline: 9 / 50 = 18.0 % Goal status: IN PROGRESS  10.  Patient to report ability to perform ADLs, household, and work-related tasks without limitation due to neck or back pain, radiculopathy, LOM or weakness. Baseline:  Goal status: IN PROGRESS   11.  Patient will improve FGA score to >/= 25/30 to improve gait stability and reduce risk for falls. Baseline: 21/30 Goal status: IN PROGRESS    PLAN:  PT FREQUENCY: 2x/week  PT DURATION: 8 weeks  PLANNED INTERVENTIONS: Therapeutic exercises, Therapeutic activity, Neuromuscular re-education, Balance training, Gait training, Patient/Family education, Self Care, Joint mobilization, DME instructions, Aquatic Therapy, Dry Needling, Electrical stimulation, Spinal manipulation, Spinal mobilization, Cryotherapy, Moist heat, Taping, Traction, Ultrasound, Manual therapy,  and Re-evaluation  PLAN FOR NEXT SESSION: review HEP if needed; gentle cervical stretching/ROM and postural training; posture and body mechanics education including review of proper desk setup; MT +/- DN to address abnormal muscle tension and pain  Davey Limas L Marirose Deveney, PTA 04/11/2023, 8:46 AM

## 2023-04-15 ENCOUNTER — Encounter: Payer: Self-pay | Admitting: Physical Therapy

## 2023-04-15 ENCOUNTER — Ambulatory Visit: Payer: BC Managed Care – PPO | Admitting: Physical Therapy

## 2023-04-15 DIAGNOSIS — R293 Abnormal posture: Secondary | ICD-10-CM

## 2023-04-15 DIAGNOSIS — M6281 Muscle weakness (generalized): Secondary | ICD-10-CM

## 2023-04-15 DIAGNOSIS — M5412 Radiculopathy, cervical region: Secondary | ICD-10-CM

## 2023-04-15 DIAGNOSIS — G8929 Other chronic pain: Secondary | ICD-10-CM

## 2023-04-15 DIAGNOSIS — M62838 Other muscle spasm: Secondary | ICD-10-CM

## 2023-04-15 NOTE — Therapy (Signed)
OUTPATIENT PHYSICAL THERAPY TREATMENT   Patient Name: Cynthia [name] MRN: [id number] DOB:Feb 10, 1961, 62 y.o., female Today's Date: 04/15/2023   END OF SESSION:  PT End of Session - 04/15/23 0802     Visit Number 10    Date for PT Re-Evaluation 05/08/23    Authorization Type BCBS    PT Start Time 0802    PT Stop Time 0844    PT Time Calculation (min) 42 min    Activity Tolerance Patient tolerated treatment well    Behavior During Therapy [facility] for tasks assessed/performed                      Past Medical History:  Diagnosis Date   Acoustic neuroma (HCC)    ADD (attention deficit disorder)    and OCD-on Vyvanse   Asthma    w/out status asthmaticus   Awareness under anesthesia    Balance problem    after acoustic neuroma excision   Chronic back pain    Complication of anesthesia    unrecognized difficult intubation   Constipation, chronic    IBS, CIC   Difficult airway    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   Acustic Neuroma - Brain tumor    GERD (gastroesophageal reflux disease)    History of diabetes mellitus    was prediabetes, off metformin   Iron deficiency anemia, unspecified 02/18/2013   Obesity    296 lbs (highest weight in 2012)    Tendonitis, Achilles, left    Past Surgical History:  Procedure Laterality Date   ABLATION     and bladder sling   ACHILLES TENDON REPAIR     x2   ANAL FISSURE REPAIR  2012   BACK SURGERY     CHOLECYSTECTOMY     COLONOSCOPY WITH PROPOFOL N/A 01/20/2021   Procedure: COLONOSCOPY WITH PROPOFOL;  Surgeon: Jeani Hawking, MD;  Location: [facility];  Service: Endoscopy;  Laterality: N/A;   CRANIOTOMY  09/13/2015   with excision of acoustic neuroma   ESOPHAGOGASTRODUODENOSCOPY (EGD) WITH PROPOFOL N/A 07/22/2020   Procedure: ESOPHAGOGASTRODUODENOSCOPY (EGD) WITH PROPOFOL;  Surgeon: Jeani Hawking, MD;  Location: [facility];  Service: Endoscopy;  Laterality: N/A;   IMPLANTATION BONE ANCHORED HEARING AID     MOUTH SURGERY  2015   3 implants    OVARIAN CYST REMOVAL     x4   THYROID LOBECTOMY Right 04/28/2015   Partial    TOE FUSION Left    Patient Active Problem List   Diagnosis Date Noted   Class 1 obesity due to excess calories without serious comorbidity with body mass index (BMI) of 30.0 to 30.9 in adult 03/08/2023   Non-restorative sleep 03/08/2023   Post-viral cough syndrome 05/30/2022   Strain of lumbar region 02/05/2022   No energy 11/29/2021   History of sleep apnea 11/29/2021   Orthostatic hypotension 08/14/2021   Irritation of left radial nerve 03/16/2021   Right-sided chest wall pain 11/28/2020   History of endometrial ablation 10/28/2020   History of partial thyroidectomy 10/28/2020   Chronic gastritis without bleeding 05/27/2020   Hiatal hernia 05/27/2020   Nausea 05/27/2020   Tremor of right hand 02/26/2020   Hearing loss of right ear 11/24/2019   Basal cell carcinoma of left upper arm 10/21/2019   Atrophic vaginitis 06/03/2019   Family history of osteoporosis 03/05/2019   Post-menopausal 03/05/2019   Motion sickness 12/26/2017   Compression of right radial nerve 06/26/2017   Fatty liver disease, nonalcoholic 03/26/2017   Left renal mass  03/26/2017   Obesity (BMI 30.0-34.9) 03/26/2017   Irritable 10/17/2016   Chronic left-sided low back pain 09/19/2016   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HCC) 12/06/2015   Deafness in left ear 12/06/2015   Hashimoto's thyroiditis 06/14/2015   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   Hypokalemia 02/13/2015   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HCC) 02/11/2015   HSV infection 08/26/2014   ADD (attention deficit disorder) 05/11/2014   Chronic constipation 05/11/2014   Essential (primary) hypertension 05/11/2014   Gastro-esophageal reflux disease without esophagitis 05/11/2014   Asthma, mild persistent 05/11/2014   Interdigital neuralgia of foot 04/14/2013   Iron deficiency anemia, unspecified 02/18/2013    PCP: Jomarie Longs, PA-C   REFERRING PROVIDER: Donalee Citrin, MD    REFERRING DIAG:  M54.40 (ICD-10-CM) - Lumbago with sciatica, unspecified side  M54.12 (ICD-10-CM) - Cervical radiculopathy at C6   THERAPY DIAG:  Radiculopathy, cervical region  Chronic bilateral low back pain with left-sided sciatica  Abnormal posture  Muscle weakness (generalized)  Other muscle spasm  RATIONALE FOR EVALUATION AND TREATMENT: Rehabilitation  ONSET DATE: ~6 months for neck pain, chronic LBP  NEXT MD VISIT: Unknown   SUBJECTIVE:                                                                                                                                                                                                         SUBJECTIVE STATEMENT: Pt reports she is feeling pretty good - hasn't used a tiger balm patch in weeks.  Only remaining issue is with the sciatica when walking on concrete - really bothered her while shopping at [facility] but not usually as bad when walking outside.  PAIN: Are you having pain? Yes: NPRS scale: 0/10 Pain location: neck Pain description: occasional tingling into L UE Aggravating factors: variable - sometimes with sitting, also when walking in the park Relieving factors: nothing  Are you having pain? Yes: NPRS scale: when standing for longer periods 2/10 Pain location: L sciatic pain and tingling into distal LE Pain description: minimal to no low back pain, sciatic pain when standing Aggravating factors: standing or walking on concrete for sciatic pain Relieving factors: injections, Tiger Balm patches, heat, exercises from prior PT HEP  PERTINENT HISTORY:  Acoustic neuroma - deafness in L ear, ADD, asthma, balance problem (after acoustic neuroma), chronic back pain, GERD, DM, orthostatic hypotension, Hashimoto's thyroiditis  PRECAUTIONS: None  HAND DOMINANCE: Right  RED FLAGS: None  WEIGHT BEARING RESTRICTIONS: No  FALLS:  Has patient fallen in last 6 months? Yes. 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 Advanced degenerative foraminal impingement on the left at C5-6. 2. Moderate degenerative foraminal narrowing on the right at C6-7. 3. Diffusely patent spinal canal.  01/20/23 - MRI Lumbar spine: IMPRESSION: 1. Generalized lumbar spine degeneration greatest at L5-S1. No significant change when compared to 2022. 2. L5-S1 left foraminal impingement. Noncompressive bilateral foraminal narrowing at L2-3. 3. Diffusely patent spinal canal.  PATIENT SURVEYS:  Modified Oswestry 10 / 50 = 20.0 %  NDI 9 / 50 = 18.0 %  SCREENING FOR RED FLAGS: Bowel or bladder incontinence: No Spinal tumors: No Cauda equina syndrome: No Compression fracture: No Abdominal aneurysm: No  COGNITION: Overall cognitive status: Within functional limits for tasks assessed     SENSATION: WFL Intermittent L UE numbness and tingling, L LE sciatica and tingling  MUSCLE LENGTH: Hamstrings: Mild tight B ITB: Mild tight B Piriformis: Mild tight B Hip IR: Mod/severe tight B Hip flexors: Mild/mod tight L>R Quads: WFL  POSTURE:  rounded shoulders, forward head, decreased lumbar lordosis, and increased thoracic kyphosis  PALPATION: TTP with increased muscle tension in L>R cervicothoracic and lumbar paraspinals, upper shoulder and periscapular muscles, as well as  glutes/piriformis  CERVICAL ROM:   Active ROM Eval 04/15/23  Flexion 34 ^ 51  Extension 44 - made tingling go away 50  Right lateral flexion 30 35  Left lateral flexion 34 40  Right rotation 63 78  Left rotation 51 - triggered tingling 72    (Blank rows = not tested, ^ - increased pain)  UPPER EXTREMITY ROM:  Active ROM Right eval Left eval R 04/15/23 L 04/15/23  Shoulder flexion 142 124 145 144  Shoulder extension 49 42 49 52  Shoulder abduction 161 148 172 165  Shoulder adduction      Shoulder internal rotation FIR WNL FIR WNL FIR WNL FIR WNL  Shoulder external rotation FER T3 FER T1 FER T3 FER T2   (Blank rows = not tested)  UPPER EXTREMITY MMT:  MMT Right eval Left eval R 04/15/23 L 04/15/23  Shoulder flexion 4+ 4 5 5   Shoulder extension 4+ 4 5 5   Shoulder abduction 4+ 4 5 5   Shoulder adduction      Shoulder internal rotation 4+ 4- 5 5  Shoulder external rotation 4 4- 4+ 4+  Middle trapezius 4- 4- 4+ 4+  Lower trapezius 3+ 3+ 4 4  (Blank rows = not tested)  LUMBAR ROM:   Active  Eval 04/04/23  Flexion Hands to mid shins Hands to lower shins  Extension 25% limited 15% limited  Right lateral flexion Hand to fibular head Fibular head  Left lateral flexion Hand to lateral knee Lateral knee  Right rotation WFL   Left rotation WFL     (Blank rows = not tested)  LOWER EXTREMITY ROM:    B LE AROM grossly WFL  LOWER  EXTREMITY MMT:    MMT Right eval Left eval R 04/11/23 L 04/11/23  Hip flexion 4 4- 4+ 4+  Hip extension 4+ 4+ 4+ 4+  Hip abduction 4 3+ 4+ 4-  Hip adduction 4- 4 4 4   Hip internal rotation 5 4+ 5 4+  Hip external rotation 4 4- 4+ 4  Knee flexion 5 4+    Knee extension 5 4 5  4+  Ankle dorsiflexion 5 5    Ankle plantarflexion      Ankle inversion      Ankle eversion        (Blank rows = not tested)  FUNCTIONAL TESTS:  Functional gait assessment: 21/30, 19-24 = medium risk fall (03/22/23)   TODAY'S TREATMENT:   04/15/23 THERAPEUTIC  EXERCISE: to improve flexibility, strength and mobility.  Demonstration, verbal and tactile cues throughout for technique.  NuStep - L6 x 6 min (UE/LE) Seated L sciatic nerve glide/flossing x 10 Hooklying L sciatic nerve glide/flossing x 10  MANUAL THERAPY: To promote normalized muscle tension, improved flexibility, and reduced pain. Skilled palpation and monitoring of soft tissue during DN Trigger Point Dry-Needling  Treatment instructions: Expect mild to moderate muscle soreness. S/S of pneumothorax if dry needled over a lung field, and to seek immediate medical attention should they occur. Patient verbalized understanding of these instructions and education. Patient Consent Given: Yes Education handout provided: Previously provided Muscles treated: L medial & lateral glute medius, glute minimus Electrical stimulation performed: No Parameters: N/A Treatment response/outcome: Twitch Response Elicited and Palpable Increase in Muscle Length STM/DTM, manual TPR and pin & stretch to muscles addressed with DN  THERAPEUTIC ACTIVITIES: Cervical & shoulder ROM assessment UE MMT Goal assessment   04/11/23 THERAPEUTIC EXERCISE: to improve flexibility, strength and mobility.  Demonstration, verbal and tactile cues throughout for technique.  UBE - L3.0 x 6 min (3' each fwd & back) S/L R/L hip abduction x 10  S/L R/L clamshells Blue TB 2 x 10  Bridge with clamshell with blue TB 2 x 10  Standing hip hike from 6' step x 10 bil MMT for LE   04/08/23 THERAPEUTIC EXERCISE: to improve flexibility, strength and mobility.  Demonstration, verbal and tactile cues throughout for technique.  UBE - L3.0 x 6 min (3' each fwd & back) L hooklying KTOS piriformis stretch 2 x 30" L hooklying figure-4 piriformis stretch 2 x 30" L seated KTOS piriformis stretch x 30" L seated hip hinge figure-4 piriformis stretch x 30" Standing L hip ABD isometric into ball on wall 10 x 5" Standing L hip ER isometric into  ball on wall 10 x 5"  MANUAL THERAPY: To promote normalized muscle tension, improved flexibility, and reduced pain. Skilled palpation and monitoring of soft tissue during DN Trigger Point Dry-Needling  Treatment instructions: Expect mild to moderate muscle soreness. Patient verbalized understanding of these instructions and education. Patient Consent Given: Yes Education handout provided: Previously provided Muscles treated: L glute medius & minimus, medial & lateral piriformis Electrical stimulation performed: No Parameters: N/A Treatment response/outcome: Twitch Response Elicited and Palpable Increase in Muscle Length STM/DTM, manual TPR and pin & stretch to muscles addressed with DN   PATIENT EDUCATION:  Education details: progress with PT, ongoing PT POC, continue with current HEP, role of DN, and DN rational, procedure, outcomes, potential side effects, and recommended post-treatment exercises/activity   Person educated: Patient Education method: Explanation Education comprehension: verbalized understanding  HOME EXERCISE PROGRAM: *Access Code: [email or web address] URL: [email or web address] Date: 04/11/2023 Prepared by: Verta Ellen  Exercises - Hooklying Single Leg Bent Knee Fallouts with Resistance  - 1 x daily - 7 x weekly - 2 sets - 10 reps - 3 sec hold - Bridge with Hip Abduction and Resistance  - 1 x daily - 7 x weekly - 2 sets - 10 reps - 5 sec hold - Clam with Resistance  - 1 x daily - 7 x weekly - 2 sets - 10 reps - 3-5 sec hold - Cat Cow  - 2 x daily - 7 x weekly - 2 sets - 10 reps - 5 sec hold - Quadruped Alternating Leg Extensions  - 1 x daily - 7 x weekly - 2 sets - 10 reps - 3 sec hold - Cervical Extension AROM with Strap  - 1 x daily - 7 x weekly - 2 sets - 10 reps - Seated Assisted Cervical Rotation with Towel  - 1 x daily - 7 x weekly - 2 sets - 10 reps - Shoulder External Rotation and Scapular Retraction with Resistance  - 1 x daily - 3 x weekly - 2 sets  - 10 reps - Standing Shoulder Horizontal Abduction with Resistance  - 1 x daily - 3 x weekly - 2 sets - 10 reps - Supine Figure 4 Piriformis Stretch (Mirrored)  - 2 x daily - 7 x weekly - 3 reps - 30 sec hold - Supine Piriformis Stretch with Foot on Ground (Mirrored)  - 2 x daily - 7 x weekly - 3 reps - 30 sec hold - Gastroc Stretch on Step  - 2 x daily - 7 x weekly - 2 sets - 30 sec hold - Sidelying Hip Abduction  - 1 x daily - 3 x weekly - 2 sets - 10 reps - Hip Hiking on Step  - 1 x daily - 3 x weekly - 2 sets - 10 reps  Patient Education - Trigger Point Dry Needling  *Patient using MedBridgeGO app   ASSESSMENT:  CLINICAL IMPRESSION: Nadyia reports continued issues with the sciatica when walking on concrete but otherwise her pain has been much better, not requiring any recent use of the Tiger Balm patches.  Neck and shoulder pain now resolved with resolution of normal neck and shoulder ROM and improved B shoulder strength with only slight lowe trap weakness still evident. DN continues to produce good twitch responses with reproduction of her sciatic-type pain.  Audriana is progressing well toward her goals and will benefit from continued skilled PT to address ongoing LE weakness and L sciatic pain deficits to improve mobility and activity tolerance with decreased pain interference.   OBJECTIVE IMPAIRMENTS: Abnormal gait, decreased activity tolerance, decreased balance, decreased coordination, decreased endurance, decreased knowledge of condition, decreased mobility, difficulty walking, decreased ROM, decreased strength, dizziness, increased fascial restrictions, impaired perceived functional ability, increased muscle spasms, impaired flexibility, impaired sensation, impaired UE functional use, improper body mechanics, postural dysfunction, and pain.   ACTIVITY LIMITATIONS: carrying, lifting, bending, squatting, sleeping, transfers, bed mobility, reach over head, and locomotion  level  PARTICIPATION LIMITATIONS: meal prep, cleaning, laundry, driving, shopping, community activity, and occupation  PERSONAL FACTORS: Fitness, Past/current experiences, Time since onset of injury/illness/exacerbation, and 3+ comorbidities: Acoustic neuroma - deafness in L ear, ADD, asthma, balance problem (after acoustic neuroma), chronic back pain, GERD, DM, orthostatic hypotension, Hashimoto's thyroiditis  are also affecting patient's functional outcome.   REHAB POTENTIAL: Good  CLINICAL DECISION MAKING: Evolving/moderate complexity  EVALUATION COMPLEXITY: Moderate   GOALS: Goals reviewed with patient? Yes  SHORT  TERM GOALS: Target date: 04/10/2023  Patient will be independent with initial HEP to improve outcomes and carryover.  Baseline: Initial HEP provided 03/18/2023 Goal status: MET - 03/26/23   2.  Patient will report reduced frequency and intensity and/or centralization of radicular symptoms.  Baseline: L UE radicular numbness and tingling into radial side of hand, L>R LE radicular pain and tingling into distal LE Goal status: PARTIALLY MET - 04/15/23 - Met for UE, but continued L sciatica when standing for long periods or walking on concrete  3.  Complete FGA. Baseline: TBA Goal status: MET - 03/22/23  LONG TERM GOALS: Target date: 05/08/2023  Patient will be independent with ongoing/advanced HEP for self-management at home.  Baseline:  Goal status: PARTIALLY MET - 04/15/23 - met for current HEP  2.  Patient will report 50-75% improvement in neck and back pain as well as associated radicular pain/numbness/tingling to improve QOL.  Baseline: L>R neck & upper shoulder up to 5/10 with frequent L UE radicular numbness and tingling into radial side of hand, LBP up to 8/10 with L>R LE radicular pain and tingling into distal LE Goal status: PARTIALLY MET - 04/08/23 - 50% improvement in L UE, 100% in low back but L sciatica seems worse  3.  Patient will demonstrate improved  posture to decrease muscle imbalance. Baseline: Severely forward head and rounded shoulder posture with increased thoracic kyphosis and flattening/reversal of lumbar lordosis Goal status: PARTIALLY MET - 04/15/23 - still requires occasional cues to correct but able to achieve good alignment   4.  Patient to demonstrate ability to achieve and maintain good spinal alignment and body mechanics needed for daily activities. Baseline:  Goal status: IN PROGRESS  5.  Patient will demonstrate functional pain free cervical ROM for safety with driving.  Baseline: Refer to above cervical ROM table Goal status: MET - 04/15/23  6.  Patient will demonstrate functional pain free lumbar ROM to perform ADLs.   Baseline: Refer to above lumbar ROM table Goal status: IN PROGRESS - 04/04/23 improvement with flex and ext  7.  Patient will demonstrate improved functional strength as demonstrated by overall B proximal UE and LE MMT >/= 4+/5. Baseline: Refer to above UE and LE MMT tables Goal status: IN PROGRESS - 04/11/23 refer to MMT table  8.  Patient will report </= 8% on modified Oswestry to demonstrate improved functional ability.  Baseline: 10 / 50 = 20.0 % Goal status: IN PROGRESS   9.  Patient will report </= 5% on NDI to demonstrate improved functional ability.  Baseline: 9 / 50 = 18.0 % Goal status: IN PROGRESS  10.  Patient to report ability to perform ADLs, household, and work-related tasks without limitation due to neck or back pain, radiculopathy, LOM or weakness. Baseline:  Goal status: PARTIALLY MET - 04/15/23 - only remaining limitation is with prolonged standing/walking on concrete (limits shopping tolerance)  11.  Patient will improve FGA score to >/= 25/30 to improve gait stability and reduce risk for falls. Baseline: 21/30 Goal status: IN PROGRESS    PLAN:  PT FREQUENCY: 2x/week  PT DURATION: 8 weeks  PLANNED INTERVENTIONS: Therapeutic exercises, Therapeutic activity,  Neuromuscular re-education, Balance training, Gait training, Patient/Family education, Self Care, Joint mobilization, DME instructions, Aquatic Therapy, Dry Needling, Electrical stimulation, Spinal manipulation, Spinal mobilization, Cryotherapy, Moist heat, Taping, Traction, Ultrasound, Manual therapy, and Re-evaluation  PLAN FOR NEXT SESSION:  LE nerve glides; lumbopelvic/hip strengthening;  postural training/strengthening; review HEP if needed; posture and body mechanics  education including review of proper desk setup; MT +/- DN to address abnormal muscle tension and pain  Marry Guan, PT 04/15/2023, 8:53 AM

## 2023-04-18 ENCOUNTER — Ambulatory Visit: Payer: BC Managed Care – PPO

## 2023-04-18 DIAGNOSIS — M6281 Muscle weakness (generalized): Secondary | ICD-10-CM

## 2023-04-18 DIAGNOSIS — M5412 Radiculopathy, cervical region: Secondary | ICD-10-CM | POA: Diagnosis not present

## 2023-04-18 DIAGNOSIS — R293 Abnormal posture: Secondary | ICD-10-CM

## 2023-04-18 DIAGNOSIS — G8929 Other chronic pain: Secondary | ICD-10-CM

## 2023-04-18 DIAGNOSIS — M62838 Other muscle spasm: Secondary | ICD-10-CM

## 2023-04-18 NOTE — Therapy (Signed)
OUTPATIENT PHYSICAL THERAPY TREATMENT   Patient Name: Cynthia [name] MRN: [id number] DOB:December 01, 1960, 62 y.o., female Today's Date: 04/18/2023   END OF SESSION:  PT End of Session - 04/18/23 0844     Visit Number 11    Date for PT Re-Evaluation 05/08/23    Authorization Type [employer]    PT Start Time 0803    PT Stop Time 0844    PT Time Calculation (min) 41 min    Activity Tolerance Patient tolerated treatment well    Behavior During Therapy [facility] for tasks assessed/performed                       Past Medical History:  Diagnosis Date   Acoustic neuroma (HCC)    ADD (attention deficit disorder)    and OCD-on Vyvanse   Asthma    w/out status asthmaticus   Awareness under anesthesia    Balance problem    after acoustic neuroma excision   Chronic back pain    Complication of anesthesia    unrecognized difficult intubation   Constipation, chronic    IBS, CIC   Difficult airway    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   Acustic Neuroma - Brain tumor    GERD (gastroesophageal reflux disease)    History of diabetes mellitus    was prediabetes, off metformin   Iron deficiency anemia, unspecified 02/18/2013   Obesity    296 lbs (highest weight in 2012)    Tendonitis, Achilles, left    Past Surgical History:  Procedure Laterality Date   ABLATION     and bladder sling   ACHILLES TENDON REPAIR     x2   ANAL FISSURE REPAIR  2012   BACK SURGERY     CHOLECYSTECTOMY     COLONOSCOPY WITH PROPOFOL N/A 01/20/2021   Procedure: COLONOSCOPY WITH PROPOFOL;  Surgeon: Jeani Hawking, MD;  Location: [facility];  Service: Endoscopy;  Laterality: N/A;   CRANIOTOMY  09/13/2015   with excision of acoustic neuroma   ESOPHAGOGASTRODUODENOSCOPY (EGD) WITH PROPOFOL N/A 07/22/2020   Procedure: ESOPHAGOGASTRODUODENOSCOPY (EGD) WITH PROPOFOL;  Surgeon: Jeani Hawking, MD;  Location: [facility];  Service: Endoscopy;  Laterality: N/A;   IMPLANTATION BONE ANCHORED HEARING AID     MOUTH SURGERY  2015   3  implants   OVARIAN CYST REMOVAL     x4   THYROID LOBECTOMY Right 04/28/2015   Partial    TOE FUSION Left    Patient Active Problem List   Diagnosis Date Noted   Class 1 obesity due to excess calories without serious comorbidity with body mass index (BMI) of 30.0 to 30.9 in adult 03/08/2023   Non-restorative sleep 03/08/2023   Post-viral cough syndrome 05/30/2022   Strain of lumbar region 02/05/2022   No energy 11/29/2021   History of sleep apnea 11/29/2021   Orthostatic hypotension 08/14/2021   Irritation of left radial nerve 03/16/2021   Right-sided chest wall pain 11/28/2020   History of endometrial ablation 10/28/2020   History of partial thyroidectomy 10/28/2020   Chronic gastritis without bleeding 05/27/2020   Hiatal hernia 05/27/2020   Nausea 05/27/2020   Tremor of right hand 02/26/2020   Hearing loss of right ear 11/24/2019   Basal cell carcinoma of left upper arm 10/21/2019   Atrophic vaginitis 06/03/2019   Family history of osteoporosis 03/05/2019   Post-menopausal 03/05/2019   Motion sickness 12/26/2017   Compression of right radial nerve 06/26/2017   Fatty liver disease, nonalcoholic 03/26/2017   Left renal  mass 03/26/2017   Obesity (BMI 30.0-34.9) 03/26/2017   Irritable 10/17/2016   Chronic left-sided low back pain 09/19/2016   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HCC) 12/06/2015   Deafness in left ear 12/06/2015   Hashimoto's thyroiditis 06/14/2015   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   Hypokalemia 02/13/2015   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HCC) 02/11/2015   HSV infection 08/26/2014   ADD (attention deficit disorder) 05/11/2014   Chronic constipation 05/11/2014   Essential (primary) hypertension 05/11/2014   Gastro-esophageal reflux disease without esophagitis 05/11/2014   Asthma, mild persistent 05/11/2014   Interdigital neuralgia of foot 04/14/2013   Iron deficiency anemia, unspecified 02/18/2013    PCP: Jomarie Longs, PA-C   REFERRING PROVIDER: Donalee Citrin,  MD   REFERRING DIAG:  M54.40 (ICD-10-CM) - Lumbago with sciatica, unspecified side  M54.12 (ICD-10-CM) - Cervical radiculopathy at C6   THERAPY DIAG:  Radiculopathy, cervical region  Chronic bilateral low back pain with left-sided sciatica  Abnormal posture  Muscle weakness (generalized)  Other muscle spasm  RATIONALE FOR EVALUATION AND TREATMENT: Rehabilitation  ONSET DATE: ~6 months for neck pain, chronic LBP  NEXT MD VISIT: Unknown   SUBJECTIVE:                                                                                                                                                                                                         SUBJECTIVE STATEMENT: Pt reports biggest issue is the sciatic pain it 8/10 when standing.  PAIN: Are you having pain? Yes: NPRS scale: 0/10 Pain location: neck Pain description: occasional tingling into L UE Aggravating factors: variable - sometimes with sitting, also when walking in the park Relieving factors: nothing  Are you having pain? Yes: NPRS scale: when standing for longer periods 2/10 Pain location: L sciatic pain and tingling into distal LE Pain description: minimal to no low back pain, sciatic pain when standing Aggravating factors: standing or walking on concrete for sciatic pain Relieving factors: injections, Tiger Balm patches, heat, exercises from prior PT HEP  PERTINENT HISTORY:  Acoustic neuroma - deafness in L ear, ADD, asthma, balance problem (after acoustic neuroma), chronic back pain, GERD, DM, orthostatic hypotension, Hashimoto's thyroiditis  PRECAUTIONS: None  HAND DOMINANCE: Right  RED FLAGS: None  WEIGHT BEARING RESTRICTIONS: No  FALLS:  Has patient fallen in last 6 months? Yes. 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 Advanced degenerative foraminal impingement on the left at C5-6. 2. Moderate degenerative foraminal narrowing on the right at C6-7. 3. Diffusely patent spinal canal.  01/20/23 - MRI Lumbar spine: IMPRESSION: 1. Generalized lumbar spine degeneration greatest at L5-S1. No significant change when compared to 2022. 2. L5-S1 left foraminal impingement. Noncompressive bilateral foraminal narrowing at L2-3. 3. Diffusely patent spinal canal.  PATIENT SURVEYS:  Modified Oswestry 10 / 50 = 20.0 %  NDI 9 / 50 = 18.0 %  SCREENING FOR RED FLAGS: Bowel or bladder incontinence: No Spinal tumors: No Cauda equina syndrome: No Compression fracture: No Abdominal aneurysm: No  COGNITION: Overall cognitive status: Within functional limits for tasks assessed     SENSATION: WFL Intermittent L UE numbness and tingling, L LE sciatica and tingling  MUSCLE LENGTH: Hamstrings: Mild tight B ITB: Mild tight B Piriformis: Mild tight B Hip IR: Mod/severe tight B Hip flexors: Mild/mod tight L>R Quads: WFL  POSTURE:  rounded shoulders, forward head, decreased lumbar lordosis, and increased thoracic kyphosis  PALPATION: TTP with increased muscle tension in L>R cervicothoracic and lumbar paraspinals, upper shoulder and periscapular muscles, as well as glutes/piriformis  CERVICAL ROM:   Active ROM Eval 04/15/23  Flexion 34 ^ 51  Extension 44 - made tingling go away 50  Right lateral flexion 30 35  Left lateral flexion 34  40  Right rotation 63 78  Left rotation 51 - triggered tingling 72    (Blank rows = not tested, ^ - increased pain)  UPPER EXTREMITY ROM:  Active ROM Right eval Left eval R 04/15/23 L 04/15/23  Shoulder flexion 142 124 145 144  Shoulder extension 49 42 49 52  Shoulder abduction 161 148 172 165  Shoulder adduction      Shoulder internal rotation FIR WNL FIR WNL FIR WNL FIR WNL  Shoulder external rotation FER T3 FER T1 FER T3 FER T2   (Blank rows = not tested)  UPPER EXTREMITY MMT:  MMT Right eval Left eval R 04/15/23 L 04/15/23  Shoulder flexion 4+ 4 5 5   Shoulder extension 4+ 4 5 5   Shoulder abduction 4+ 4 5 5   Shoulder adduction      Shoulder internal rotation 4+ 4- 5 5  Shoulder external rotation 4 4- 4+ 4+  Middle trapezius 4- 4- 4+ 4+  Lower trapezius 3+ 3+ 4 4  (Blank rows = not tested)  LUMBAR ROM:   Active  Eval 04/04/23  Flexion Hands to mid shins Hands to lower shins  Extension 25% limited 15% limited  Right lateral flexion Hand to fibular head Fibular head  Left lateral flexion Hand to lateral knee Lateral knee  Right rotation WFL   Left rotation WFL     (Blank rows = not tested)  LOWER EXTREMITY ROM:    B LE AROM grossly [facility]  LOWER EXTREMITY MMT:    MMT Right eval Left eval R 04/11/23 L 04/11/23  Hip flexion 4 4- 4+ 4+  Hip extension 4+ 4+ 4+ 4+  Hip abduction  4 3+ 4+ 4-  Hip adduction 4- 4 4 4   Hip internal rotation 5 4+ 5 4+  Hip external rotation 4 4- 4+ 4  Knee flexion 5 4+    Knee extension 5 4 5  4+  Ankle dorsiflexion 5 5    Ankle plantarflexion      Ankle inversion      Ankle eversion        (Blank rows = not tested)  FUNCTIONAL TESTS:  Functional gait assessment: 21/30, 19-24 = medium risk fall (03/22/23)   TODAY'S TREATMENT:  04/18/23 THERAPEUTIC EXERCISE: to improve flexibility, strength and mobility.  Demonstration, verbal and tactile cues throughout for technique.  Rec Bike L2x40min Seated L sciatic nerve glide 2x10 Supine  sciatic nerve glide with strap 2x10 Prone press ups x 10; POE x 30 seconds Prone hip extension x 10 bil Seated R/L piriformis stretch 2x30"    MANUAL THERAPY: To promote normalized muscle tension, improved flexibility, and reduced pain. IASTM with massage gun to L piriformis  04/15/23 THERAPEUTIC EXERCISE: to improve flexibility, strength and mobility.  Demonstration, verbal and tactile cues throughout for technique.  NuStep - L6 x 6 min (UE/LE) Seated L sciatic nerve glide/flossing x 10 Hooklying L sciatic nerve glide/flossing x 10  MANUAL THERAPY: To promote normalized muscle tension, improved flexibility, and reduced pain. Skilled palpation and monitoring of soft tissue during DN Trigger Point Dry-Needling  Treatment instructions: Expect mild to moderate muscle soreness. S/S of pneumothorax if dry needled over a lung field, and to seek immediate medical attention should they occur. Patient verbalized understanding of these instructions and education. Patient Consent Given: Yes Education handout provided: Previously provided Muscles treated: L medial & lateral glute medius, glute minimus Electrical stimulation performed: No Parameters: N/A Treatment response/outcome: Twitch Response Elicited and Palpable Increase in Muscle Length STM/DTM, manual TPR and pin & stretch to muscles addressed with DN  THERAPEUTIC ACTIVITIES: Cervical & shoulder ROM assessment UE MMT Goal assessment   04/11/23 THERAPEUTIC EXERCISE: to improve flexibility, strength and mobility.  Demonstration, verbal and tactile cues throughout for technique.  UBE - L3.0 x 6 min (3' each fwd & back) S/L R/L hip abduction x 10  S/L R/L clamshells Blue TB 2 x 10  Bridge with clamshell with blue TB 2 x 10  Standing hip hike from 6' step x 10 bil MMT for LE   04/08/23 THERAPEUTIC EXERCISE: to improve flexibility, strength and mobility.  Demonstration, verbal and tactile cues throughout for technique.  UBE - L3.0 x  6 min (3' each fwd & back) L hooklying KTOS piriformis stretch 2 x 30" L hooklying figure-4 piriformis stretch 2 x 30" L seated KTOS piriformis stretch x 30" L seated hip hinge figure-4 piriformis stretch x 30" Standing L hip ABD isometric into ball on wall 10 x 5" Standing L hip ER isometric into ball on wall 10 x 5"  MANUAL THERAPY: To promote normalized muscle tension, improved flexibility, and reduced pain. Skilled palpation and monitoring of soft tissue during DN Trigger Point Dry-Needling  Treatment instructions: Expect mild to moderate muscle soreness. Patient verbalized understanding of these instructions and education. Patient Consent Given: Yes Education handout provided: Previously provided Muscles treated: L glute medius & minimus, medial & lateral piriformis Electrical stimulation performed: No Parameters: N/A Treatment response/outcome: Twitch Response Elicited and Palpable Increase in Muscle Length STM/DTM, manual TPR and pin & stretch to muscles addressed with DN   PATIENT EDUCATION:  Education details: progress with PT, ongoing PT POC, continue  with current HEP, role of DN, and DN rational, procedure, outcomes, potential side effects, and recommended post-treatment exercises/activity   Person educated: Patient Education method: Explanation Education comprehension: verbalized understanding  HOME EXERCISE PROGRAM: *Access Code: [email or web address] URL: [email or web address] Date: 04/18/2023 Prepared by: Verta Ellen  Exercises - Hooklying Single Leg Bent Knee Fallouts with Resistance  - 1 x daily - 7 x weekly - 2 sets - 10 reps - 3 sec hold - Bridge with Hip Abduction and Resistance  - 1 x daily - 7 x weekly - 2 sets - 10 reps - 5 sec hold - Clam with Resistance  - 1 x daily - 7 x weekly - 2 sets - 10 reps - 3-5 sec hold - Cat Cow  - 2 x daily - 7 x weekly - 2 sets - 10 reps - 5 sec hold - Quadruped Alternating Leg Extensions  - 1 x daily - 7 x weekly - 2 sets  - 10 reps - 3 sec hold - Cervical Extension AROM with Strap  - 1 x daily - 7 x weekly - 2 sets - 10 reps - Seated Assisted Cervical Rotation with Towel  - 1 x daily - 7 x weekly - 2 sets - 10 reps - Shoulder External Rotation and Scapular Retraction with Resistance  - 1 x daily - 3 x weekly - 2 sets - 10 reps - Standing Shoulder Horizontal Abduction with Resistance  - 1 x daily - 3 x weekly - 2 sets - 10 reps - Supine Figure 4 Piriformis Stretch (Mirrored)  - 2 x daily - 7 x weekly - 3 reps - 30 sec hold - Supine Piriformis Stretch with Foot on Ground (Mirrored)  - 2 x daily - 7 x weekly - 3 reps - 30 sec hold - Gastroc Stretch on Step  - 2 x daily - 7 x weekly - 2 sets - 30 sec hold - Sidelying Hip Abduction  - 1 x daily - 3 x weekly - 2 sets - 10 reps - Hip Hiking on Step  - 1 x daily - 3 x weekly - 2 sets - 10 reps - Seated Sciatic Tensioner  - 1 x daily - 7 x weekly - 2- sets - 10 reps - Supine Sciatic Nerve Mobilization with Strap  - 1 x daily - 7 x weekly - 2-3 sets - 10 reps  Patient Education - Trigger Point Dry Needling  *Patient using [email or web address] app   ASSESSMENT:  CLINICAL IMPRESSION: Pt continues to report flare up of L sciatic pain. Focused on nerve glides, stretching, and MT to address nerve/muscle tension and pain. Palpable TrPs noted in the L piriformis muscle today, after MT less tension and tenderness noted. Nerve glides also added to HEP Miona is progressing well toward her goals and will benefit from continued skilled PT to address ongoing LE weakness and L sciatic pain deficits to improve mobility and activity tolerance with decreased pain interference.   OBJECTIVE IMPAIRMENTS: Abnormal gait, decreased activity tolerance, decreased balance, decreased coordination, decreased endurance, decreased knowledge of condition, decreased mobility, difficulty walking, decreased ROM, decreased strength, dizziness, increased fascial restrictions, impaired perceived functional ability,  increased muscle spasms, impaired flexibility, impaired sensation, impaired UE functional use, improper body mechanics, postural dysfunction, and pain.   ACTIVITY LIMITATIONS: carrying, lifting, bending, squatting, sleeping, transfers, bed mobility, reach over head, and locomotion level  PARTICIPATION LIMITATIONS: meal prep, cleaning, laundry, driving, shopping, community activity, and occupation  PERSONAL FACTORS: Fitness, Past/current experiences, Time since  onset of injury/illness/exacerbation, and 3+ comorbidities: Acoustic neuroma - deafness in L ear, ADD, asthma, balance problem (after acoustic neuroma), chronic back pain, GERD, DM, orthostatic hypotension, Hashimoto's thyroiditis  are also affecting patient's functional outcome.   REHAB POTENTIAL: Good  CLINICAL DECISION MAKING: Evolving/moderate complexity  EVALUATION COMPLEXITY: Moderate   GOALS: Goals reviewed with patient? Yes  SHORT TERM GOALS: Target date: 04/10/2023  Patient will be independent with initial HEP to improve outcomes and carryover.  Baseline: Initial HEP provided 03/18/2023 Goal status: MET - 03/26/23   2.  Patient will report reduced frequency and intensity and/or centralization of radicular symptoms.  Baseline: L UE radicular numbness and tingling into radial side of hand, L>R LE radicular pain and tingling into distal LE Goal status: PARTIALLY MET - 04/15/23 - Met for UE, but continued L sciatica when standing for long periods or walking on concrete  3.  Complete FGA. Baseline: TBA Goal status: MET - 03/22/23  LONG TERM GOALS: Target date: 05/08/2023  Patient will be independent with ongoing/advanced HEP for self-management at home.  Baseline:  Goal status: PARTIALLY MET - 04/15/23 - met for current HEP  2.  Patient will report 50-75% improvement in neck and back pain as well as associated radicular pain/numbness/tingling to improve QOL.  Baseline: L>R neck & upper shoulder up to 5/10 with frequent L  UE radicular numbness and tingling into radial side of hand, LBP up to 8/10 with L>R LE radicular pain and tingling into distal LE Goal status: PARTIALLY MET - 04/08/23 - 50% improvement in L UE, 100% in low back but L sciatica seems worse  3.  Patient will demonstrate improved posture to decrease muscle imbalance. Baseline: Severely forward head and rounded shoulder posture with increased thoracic kyphosis and flattening/reversal of lumbar lordosis Goal status: PARTIALLY MET - 04/15/23 - still requires occasional cues to correct but able to achieve good alignment   4.  Patient to demonstrate ability to achieve and maintain good spinal alignment and body mechanics needed for daily activities. Baseline:  Goal status: IN PROGRESS  5.  Patient will demonstrate functional pain free cervical ROM for safety with driving.  Baseline: Refer to above cervical ROM table Goal status: MET - 04/15/23  6.  Patient will demonstrate functional pain free lumbar ROM to perform ADLs.   Baseline: Refer to above lumbar ROM table Goal status: IN PROGRESS - 04/04/23 improvement with flex and ext  7.  Patient will demonstrate improved functional strength as demonstrated by overall B proximal UE and LE MMT >/= 4+/5. Baseline: Refer to above UE and LE MMT tables Goal status: IN PROGRESS - 04/11/23 refer to MMT table  8.  Patient will report </= 8% on modified Oswestry to demonstrate improved functional ability.  Baseline: 10 / 50 = 20.0 % Goal status: IN PROGRESS   9.  Patient will report </= 5% on NDI to demonstrate improved functional ability.  Baseline: 9 / 50 = 18.0 % Goal status: IN PROGRESS  10.  Patient to report ability to perform ADLs, household, and work-related tasks without limitation due to neck or back pain, radiculopathy, LOM or weakness. Baseline:  Goal status: PARTIALLY MET - 04/15/23 - only remaining limitation is with prolonged standing/walking on concrete (limits shopping tolerance)  11.   Patient will improve FGA score to >/= 25/30 to improve gait stability and reduce risk for falls. Baseline: 21/30 Goal status: IN PROGRESS    PLAN:  PT FREQUENCY: 2x/week  PT DURATION: 8 weeks  PLANNED  INTERVENTIONS: Therapeutic exercises, Therapeutic activity, Neuromuscular re-education, Balance training, Gait training, Patient/Family education, Self Care, Joint mobilization, DME instructions, Aquatic Therapy, Dry Needling, Electrical stimulation, Spinal manipulation, Spinal mobilization, Cryotherapy, Moist heat, Taping, Traction, Ultrasound, Manual therapy, and Re-evaluation  PLAN FOR NEXT SESSION:  LE nerve glides; continue addressing muscle tension in piriformis; lumbopelvic/hip strengthening;  postural training/strengthening; review HEP if needed; posture and body mechanics education including review of proper desk setup; MT +/- DN to address abnormal muscle tension and pain  Colton Engdahl L Mahayla Haddaway, PTA 04/18/2023, 8:47 AM

## 2023-04-19 NOTE — Telephone Encounter (Addendum)
Initiated Prior authorization ZOX:[id number] [id number]  er tablets Via: Covermymeds Case/Key:[id number] Status: approved  as of 04/19/23 Reason:Authorization Expiration Date: 04/17/2024  Notified Pt via: Mychart

## 2023-04-22 ENCOUNTER — Other Ambulatory Visit: Payer: Self-pay | Admitting: Student

## 2023-04-22 ENCOUNTER — Ambulatory Visit: Payer: BC Managed Care – PPO | Attending: Neurosurgery | Admitting: Physical Therapy

## 2023-04-22 ENCOUNTER — Encounter: Payer: Self-pay | Admitting: Physical Therapy

## 2023-04-22 DIAGNOSIS — M5442 Lumbago with sciatica, left side: Secondary | ICD-10-CM | POA: Insufficient documentation

## 2023-04-22 DIAGNOSIS — G8929 Other chronic pain: Secondary | ICD-10-CM | POA: Diagnosis present

## 2023-04-22 DIAGNOSIS — M62838 Other muscle spasm: Secondary | ICD-10-CM | POA: Diagnosis present

## 2023-04-22 DIAGNOSIS — M6281 Muscle weakness (generalized): Secondary | ICD-10-CM | POA: Diagnosis present

## 2023-04-22 DIAGNOSIS — M5412 Radiculopathy, cervical region: Secondary | ICD-10-CM | POA: Insufficient documentation

## 2023-04-22 DIAGNOSIS — R293 Abnormal posture: Secondary | ICD-10-CM | POA: Insufficient documentation

## 2023-04-22 DIAGNOSIS — M545 Low back pain, unspecified: Secondary | ICD-10-CM

## 2023-04-22 NOTE — Therapy (Unsigned)
OUTPATIENT PHYSICAL THERAPY TREATMENT   Patient Name: Cynthia [name] MRN: [id number] DOB:1961/02/10, 62 y.o., female Today's Date: 04/22/2023   END OF SESSION:  PT End of Session - 04/22/23 0805     Visit Number 12    Date for PT Re-Evaluation 05/08/23    Authorization Type [employer]    PT Start Time 0805    PT Stop Time 0846    PT Time Calculation (min) 41 min    Activity Tolerance Patient tolerated treatment well    Behavior During Therapy [facility] for tasks assessed/performed                       Past Medical History:  Diagnosis Date   Acoustic neuroma (HCC)    ADD (attention deficit disorder)    and OCD-on Vyvanse   Asthma    w/out status asthmaticus   Awareness under anesthesia    Balance problem    after acoustic neuroma excision   Chronic back pain    Complication of anesthesia    unrecognized difficult intubation   Constipation, chronic    IBS, CIC   Difficult airway    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   Acustic Neuroma - Brain tumor    GERD (gastroesophageal reflux disease)    History of diabetes mellitus    was prediabetes, off metformin   Iron deficiency anemia, unspecified 02/18/2013   Obesity    296 lbs (highest weight in 2012)    Tendonitis, Achilles, left    Past Surgical History:  Procedure Laterality Date   ABLATION     and bladder sling   ACHILLES TENDON REPAIR     x2   ANAL FISSURE REPAIR  2012   BACK SURGERY     CHOLECYSTECTOMY     COLONOSCOPY WITH PROPOFOL N/A 01/20/2021   Procedure: COLONOSCOPY WITH PROPOFOL;  Surgeon: Jeani Hawking, MD;  Location: [facility];  Service: Endoscopy;  Laterality: N/A;   CRANIOTOMY  09/13/2015   with excision of acoustic neuroma   ESOPHAGOGASTRODUODENOSCOPY (EGD) WITH PROPOFOL N/A 07/22/2020   Procedure: ESOPHAGOGASTRODUODENOSCOPY (EGD) WITH PROPOFOL;  Surgeon: Jeani Hawking, MD;  Location: [facility];  Service: Endoscopy;  Laterality: N/A;   IMPLANTATION BONE ANCHORED HEARING AID     MOUTH SURGERY  2015   3 implants    OVARIAN CYST REMOVAL     x4   THYROID LOBECTOMY Right 04/28/2015   Partial    TOE FUSION Left    Patient Active Problem List   Diagnosis Date Noted   Class 1 obesity due to excess calories without serious comorbidity with body mass index (BMI) of 30.0 to 30.9 in adult 03/08/2023   Non-restorative sleep 03/08/2023   Post-viral cough syndrome 05/30/2022   Strain of lumbar region 02/05/2022   No energy 11/29/2021   History of sleep apnea 11/29/2021   Orthostatic hypotension 08/14/2021   Irritation of left radial nerve 03/16/2021   Right-sided chest wall pain 11/28/2020   History of endometrial ablation 10/28/2020   History of partial thyroidectomy 10/28/2020   Chronic gastritis without bleeding 05/27/2020   Hiatal hernia 05/27/2020   Nausea 05/27/2020   Tremor of right hand 02/26/2020   Hearing loss of right ear 11/24/2019   Basal cell carcinoma of left upper arm 10/21/2019   Atrophic vaginitis 06/03/2019   Family history of osteoporosis 03/05/2019   Post-menopausal 03/05/2019   Motion sickness 12/26/2017   Compression of right radial nerve 06/26/2017   Fatty liver disease, nonalcoholic 03/26/2017   Left renal  mass 03/26/2017   Obesity (BMI 30.0-34.9) 03/26/2017   Irritable 10/17/2016   Chronic left-sided low back pain 09/19/2016   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HCC) 12/06/2015   Deafness in left ear 12/06/2015   Hashimoto's thyroiditis 06/14/2015   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   Hypokalemia 02/13/2015   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HCC) 02/11/2015   HSV infection 08/26/2014   ADD (attention deficit disorder) 05/11/2014   Chronic constipation 05/11/2014   Essential (primary) hypertension 05/11/2014   Gastro-esophageal reflux disease without esophagitis 05/11/2014   Asthma, mild persistent 05/11/2014   Interdigital neuralgia of foot 04/14/2013   Iron deficiency anemia, unspecified 02/18/2013    PCP: Jomarie Longs, PA-C   REFERRING PROVIDER: Donalee Citrin, MD    REFERRING DIAG:  M54.40 (ICD-10-CM) - Lumbago with sciatica, unspecified side  M54.12 (ICD-10-CM) - Cervical radiculopathy at C6   THERAPY DIAG:  Radiculopathy, cervical region  Chronic bilateral low back pain with left-sided sciatica  Abnormal posture  Muscle weakness (generalized)  Other muscle spasm  RATIONALE FOR EVALUATION AND TREATMENT: Rehabilitation  ONSET DATE: ~6 months for neck pain, chronic LBP  NEXT MD VISIT: Unknown   SUBJECTIVE:                                                                                                                                                                                                         SUBJECTIVE STATEMENT: Pt reports no change with the nerve glides. Only pain is the sciatica with standing.  PAIN: Are you having pain? Yes: NPRS scale: 0/10 Pain location: neck Pain description: occasional tingling into L UE Aggravating factors: variable - sometimes with sitting, also when walking in the park Relieving factors: nothing  Are you having pain? Yes: NPRS scale: when standing for longer periods 2/10 Pain location: L sciatic pain and tingling into distal LE Pain description: minimal to no low back pain, sciatic pain when standing Aggravating factors: standing or walking on concrete for sciatic pain Relieving factors: injections, Tiger Balm patches, heat, exercises from prior PT HEP  PERTINENT HISTORY:  Acoustic neuroma - deafness in L ear, ADD, asthma, balance problem (after acoustic neuroma), chronic back pain, GERD, DM, orthostatic hypotension, Hashimoto's thyroiditis  PRECAUTIONS: None  HAND DOMINANCE: Right  RED FLAGS: None  WEIGHT BEARING RESTRICTIONS: No  FALLS:  Has patient fallen in last 6 months? Yes. 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 Advanced degenerative foraminal impingement on the left at C5-6. 2. Moderate degenerative foraminal narrowing on the right at C6-7. 3. Diffusely patent spinal canal.  01/20/23 - MRI Lumbar spine: IMPRESSION: 1. Generalized lumbar spine degeneration greatest at L5-S1. No significant change when compared to 2022. 2. L5-S1 left foraminal impingement. Noncompressive bilateral foraminal narrowing at L2-3. 3. Diffusely patent spinal canal.  PATIENT SURVEYS:  Modified Oswestry 10 / 50 = 20.0 %  NDI 9 / 50 = 18.0 %  SCREENING FOR RED FLAGS: Bowel or bladder incontinence: No Spinal tumors: No Cauda equina syndrome: No Compression fracture: No Abdominal aneurysm: No  COGNITION: Overall cognitive status: Within functional limits for tasks assessed     SENSATION: WFL Intermittent L UE numbness and tingling, L LE sciatica and tingling  MUSCLE LENGTH: Hamstrings: Mild tight B ITB: Mild tight B Piriformis: Mild tight B Hip IR: Mod/severe tight B Hip flexors: Mild/mod tight L>R Quads: WFL  POSTURE:  rounded shoulders, forward head, decreased lumbar lordosis, and increased thoracic kyphosis  PALPATION: TTP with increased muscle tension in L>R cervicothoracic and lumbar paraspinals, upper shoulder and periscapular muscles, as well as glutes/piriformis  CERVICAL ROM:   Active ROM Eval 04/15/23  Flexion 34 ^ 51  Extension 44 - made tingling go away 50  Right lateral flexion 30 35  Left  lateral flexion 34 40  Right rotation 63 78  Left rotation 51 - triggered tingling 72    (Blank rows = not tested, ^ - increased pain)  UPPER EXTREMITY ROM:  Active ROM Right eval Left eval R 04/15/23 L 04/15/23  Shoulder flexion 142 124 145 144  Shoulder extension 49 42 49 52  Shoulder abduction 161 148 172 165  Shoulder adduction      Shoulder internal rotation FIR WNL FIR WNL FIR WNL FIR WNL  Shoulder external rotation FER T3 FER T1 FER T3 FER T2   (Blank rows = not tested)  UPPER EXTREMITY MMT:  MMT Right eval Left eval R 04/15/23 L 04/15/23  Shoulder flexion 4+ 4 5 5   Shoulder extension 4+ 4 5 5   Shoulder abduction 4+ 4 5 5   Shoulder adduction      Shoulder internal rotation 4+ 4- 5 5  Shoulder external rotation 4 4- 4+ 4+  Middle trapezius 4- 4- 4+ 4+  Lower trapezius 3+ 3+ 4 4  (Blank rows = not tested)  LUMBAR ROM:   Active  Eval 04/04/23  Flexion Hands to mid shins Hands to lower shins  Extension 25% limited 15% limited  Right lateral flexion Hand to fibular head Fibular head  Left lateral flexion Hand to lateral knee Lateral knee  Right rotation WFL   Left rotation WFL     (Blank rows = not tested)  LOWER EXTREMITY ROM:    B LE AROM grossly [facility]  LOWER EXTREMITY MMT:    MMT Right eval Left eval R 04/11/23 L 04/11/23  Hip flexion 4 4- 4+ 4+  Hip extension 4+ 4+ 4+ 4+  Hip abduction 4 3+ 4+ 4-  Hip adduction 4- 4 4 4   Hip internal rotation 5 4+ 5 4+  Hip external rotation 4 4- 4+ 4  Knee flexion 5 4+    Knee extension 5 4 5  4+  Ankle dorsiflexion 5 5    Ankle plantarflexion      Ankle inversion      Ankle eversion        (Blank rows = not tested)  FUNCTIONAL TESTS:  Functional gait assessment: 21/30, 19-24 = medium risk fall (03/22/23)   TODAY'S TREATMENT:   04/22/23 THERAPEUTIC EXERCISE: to improve flexibility, strength and mobility.  Demonstration, verbal and tactile cues throughout for technique.  NuStep - L5 x 6 min (UE/LE) Standing  lateral lean L ITB stretch with counter support x 30" Standing lateral lean L ITB stretch into wall with elbow support on wall x 30" - pt noting better stretch than latter Supine cross-body L ITB stretch with strap 3 x 30" - pt reporting best stretch with this version Quadruped alt R/L hip ABD & ER 3 x 10 S/L L hip ABD + slight extension 2 x 10 Standing 45 L hip ABD + extension with looped RTB at ankles 2 x 10 Standing L hip ABD + ER 2 x 10   04/18/23 THERAPEUTIC EXERCISE: to improve flexibility, strength and mobility.  Demonstration, verbal and tactile cues throughout for technique.  Rec Bike L2x47min Seated L sciatic nerve glide 2x10 Supine sciatic nerve glide with strap 2x10 Prone press ups x 10; POE x 30 seconds Prone hip extension x 10 bil Seated R/L piriformis stretch 2x30"    MANUAL THERAPY: To promote normalized muscle tension, improved flexibility, and reduced pain. IASTM with massage gun to L piriformis   04/15/23 THERAPEUTIC EXERCISE: to improve flexibility, strength and mobility.  Demonstration, verbal and tactile cues throughout for technique.  NuStep - L6 x 6 min (UE/LE) Seated L sciatic nerve glide/flossing x 10 Hooklying L sciatic nerve glide/flossing x 10  MANUAL THERAPY: To promote normalized muscle tension, improved flexibility, and reduced pain. Skilled palpation and monitoring of soft tissue during DN Trigger Point Dry-Needling  Treatment instructions: Expect mild to moderate muscle soreness. S/S of pneumothorax if dry needled over a lung field, and to seek immediate medical attention should they occur. Patient verbalized understanding of these instructions and education. Patient Consent Given: Yes Education handout provided: Previously provided Muscles treated: L medial & lateral glute medius, glute minimus Electrical stimulation performed: No Parameters: N/A Treatment response/outcome: Twitch Response Elicited and Palpable Increase in Muscle  Length STM/DTM, manual TPR and pin & stretch to muscles addressed with DN  THERAPEUTIC ACTIVITIES: Cervical & shoulder ROM assessment UE MMT Goal assessment   PATIENT EDUCATION:  Education details: HEP update - ITB stretches and progression of hip ABD/ER strengthening    Person educated: Patient Education method: Explanation, Demonstration, Verbal cues, and MedBridgeGO app updated Education comprehension: verbalized understanding, returned demonstration, verbal cues required, and needs further education  HOME EXERCISE PROGRAM: *Access Code: [email or web address] URL: [email or web address] Date: 04/22/2023 Prepared by: Glenetta Hew  Exercises - Bridge with Hip Abduction and Resistance  - 1 x daily - 3 x weekly - 2 sets - 10 reps - 5 sec hold - Clam with Resistance  - 1 x daily - 3 x weekly - 2 sets - 10 reps - 3-5 sec hold - Cat Cow  - 2 x daily - 7 x weekly - 2 sets - 10 reps - 5 sec hold -  Quadruped Alternating Leg Extensions  - 1 x daily - 3 x weekly - 2 sets - 10 reps - 3 sec hold - Cervical Extension AROM with Strap  - 1 x daily - 7 x weekly - 2 sets - 10 reps - Seated Assisted Cervical Rotation with Towel  - 1 x daily - 7 x weekly - 2 sets - 10 reps - Supine Figure 4 Piriformis Stretch (Mirrored)  - 2 x daily - 7 x weekly - 3 reps - 30 sec hold - Supine Piriformis Stretch with Foot on Ground (Mirrored)  - 2 x daily - 7 x weekly - 3 reps - 30 sec hold - Gastroc Stretch on Step  - 2 x daily - 7 x weekly - 2 sets - 30 sec hold - Hip Hiking on Step  - 1 x daily - 3 x weekly - 2 sets - 10 reps - Seated Sciatic Tensioner  - 1 x daily - 7 x weekly - 2- sets - 10 reps - Supine Sciatic Nerve Mobilization with Strap  - 1 x daily - 7 x weekly - 2-3 sets - 10 reps - Supine Iliotibial Band Stretch with Strap (Mirrored)  - 2-3 x daily - 7 x weekly - 3 reps - 30 sec hold - Sidelying Hip Abduction and Extension with Loop Band  - 1 x daily - 3 x weekly - 2 sets - 10 reps - 3 sec hold -  Quadruped Hip Abduction and External Rotation  - 1 x daily - 3 x weekly - 2 sets - 10 reps - 3 sec hold - Frogger  - 1 x daily - 3 x weekly - 2 sets - 10 reps - 3 sec hold  Patient Education - Trigger Point Dry Needling  *Patient using MedBridgeGO app   ASSESSMENT:  CLINICAL IMPRESSION: Cynthia [name] reports her pain is now typically only the L sciatic pain which is only an issue with prolonged standing or walking.  Further assessment revealing pain more consistent with L ITB distribution and appears to originate more from her glutes than TFL.  She also demonstrates localized TTP over L greater trochanter, potentially consistent with greater trochanteric bursitis consistent with ITB syndrome.  Instructed her in several variations of ITB stretches with best stretch noted in supine cross-body ITB stretch with strap. Stretching followed by continued strengthening to address lateral hip weakness, esp in glute medius and hip ER.  She may benefit from ionto patch to L greater trochanter, therefore order sent to referring provider to add iontophoresis to POC.  Cynthia [name] is progressing well toward her goals and will benefit from continued skilled PT to address ongoing LE weakness and L sciatic/ITB pain deficits to improve mobility and activity tolerance with decreased pain interference.   OBJECTIVE IMPAIRMENTS: Abnormal gait, decreased activity tolerance, decreased balance, decreased coordination, decreased endurance, decreased knowledge of condition, decreased mobility, difficulty walking, decreased ROM, decreased strength, dizziness, increased fascial restrictions, impaired perceived functional ability, increased muscle spasms, impaired flexibility, impaired sensation, impaired UE functional use, improper body mechanics, postural dysfunction, and pain.   ACTIVITY LIMITATIONS: carrying, lifting, bending, squatting, sleeping, transfers, bed mobility, reach over head, and locomotion level  PARTICIPATION LIMITATIONS:  meal prep, cleaning, laundry, driving, shopping, community activity, and occupation  PERSONAL FACTORS: Fitness, Past/current experiences, Time since onset of injury/illness/exacerbation, and 3+ comorbidities: Acoustic neuroma - deafness in L ear, ADD, asthma, balance problem (after acoustic neuroma), chronic back pain, GERD, DM, orthostatic hypotension, Hashimoto's thyroiditis  are also affecting patient's functional  outcome.   REHAB POTENTIAL: Good  CLINICAL DECISION MAKING: Evolving/moderate complexity  EVALUATION COMPLEXITY: Moderate   GOALS: Goals reviewed with patient? Yes  SHORT TERM GOALS: Target date: 04/10/2023  Patient will be independent with initial HEP to improve outcomes and carryover.  Baseline: Initial HEP provided 03/18/2023 Goal status: MET - 03/26/23   2.  Patient will report reduced frequency and intensity and/or centralization of radicular symptoms.  Baseline: L UE radicular numbness and tingling into radial side of hand, L>R LE radicular pain and tingling into distal LE Goal status: PARTIALLY MET - 04/15/23 - Met for UE, but continued L sciatica when standing for long periods or walking on concrete  3.  Complete FGA. Baseline: TBA Goal status: MET - 03/22/23  LONG TERM GOALS: Target date: 05/08/2023  Patient will be independent with ongoing/advanced HEP for self-management at home.  Baseline:  Goal status: PARTIALLY MET - 04/15/23 - met for current HEP  2.  Patient will report 50-75% improvement in neck and back pain as well as associated radicular pain/numbness/tingling to improve QOL.  Baseline: L>R neck & upper shoulder up to 5/10 with frequent L UE radicular numbness and tingling into radial side of hand, LBP up to 8/10 with L>R LE radicular pain and tingling into distal LE Goal status: PARTIALLY MET - 04/08/23 - 50% improvement in L UE, 100% in low back but L sciatica seems worse  3.  Patient will demonstrate improved posture to decrease muscle  imbalance. Baseline: Severely forward head and rounded shoulder posture with increased thoracic kyphosis and flattening/reversal of lumbar lordosis Goal status: PARTIALLY MET - 04/15/23 - still requires occasional cues to correct but able to achieve good alignment   4.  Patient to demonstrate ability to achieve and maintain good spinal alignment and body mechanics needed for daily activities. Baseline:  Goal status: IN PROGRESS  5.  Patient will demonstrate functional pain free cervical ROM for safety with driving.  Baseline: Refer to above cervical ROM table Goal status: MET - 04/15/23  6.  Patient will demonstrate functional pain free lumbar ROM to perform ADLs.   Baseline: Refer to above lumbar ROM table Goal status: IN PROGRESS - 04/04/23 improvement with flex and ext  7.  Patient will demonstrate improved functional strength as demonstrated by overall B proximal UE and LE MMT >/= 4+/5. Baseline: Refer to above UE and LE MMT tables Goal status: IN PROGRESS - 04/11/23 refer to MMT table  8.  Patient will report </= 8% on modified Oswestry to demonstrate improved functional ability.  Baseline: 10 / 50 = 20.0 % Goal status: IN PROGRESS   9.  Patient will report </= 5% on NDI to demonstrate improved functional ability.  Baseline: 9 / 50 = 18.0 % Goal status: IN PROGRESS  10.  Patient to report ability to perform ADLs, household, and work-related tasks without limitation due to neck or back pain, radiculopathy, LOM or weakness. Baseline:  Goal status: PARTIALLY MET - 04/15/23 - only remaining limitation is with prolonged standing/walking on concrete (limits shopping tolerance)  11.  Patient will improve FGA score to >/= 25/30 to improve gait stability and reduce risk for falls. Baseline: 21/30 Goal status: IN PROGRESS    PLAN:  PT FREQUENCY: 2x/week  PT DURATION: 8 weeks  PLANNED INTERVENTIONS: Therapeutic exercises, Therapeutic activity, Neuromuscular re-education, Balance  training, Gait training, Patient/Family education, Self Care, Joint mobilization, DME instructions, Aquatic Therapy, Dry Needling, Electrical stimulation, Spinal manipulation, Spinal mobilization, Cryotherapy, Moist heat, Taping, Traction, Ultrasound,  Manual therapy, and Re-evaluation  PLAN FOR NEXT SESSION:  Ionto patch to L greater trochanter if order signed by MD; LE nerve glides; continue addressing muscle tension in piriformis & glutes; lumbopelvic/hip strengthening - emphasis on hip ABD and ER; review/update HEP PRN as pt nearing end of POC; MT +/- DN to address abnormal muscle tension and pain; will assess for need for recert versus transition to HEP on 05/08/23  Marry Guan, PT 04/22/2023, 4:41 PM

## 2023-04-24 NOTE — Discharge Instructions (Signed)

## 2023-04-25 ENCOUNTER — Ambulatory Visit
Admission: RE | Admit: 2023-04-25 | Discharge: 2023-04-25 | Disposition: A | Discharge status: Home / Self Care | Payer: BC Managed Care – PPO | Source: Ambulatory Visit | Attending: Student | Admitting: Student

## 2023-04-25 DIAGNOSIS — M545 Low back pain, unspecified: Secondary | ICD-10-CM

## 2023-04-25 MED ORDER — IOPAMIDOL (ISOVUE-M 200) INJECTION 41%
1.0000 mL | Freq: Once | INTRAMUSCULAR | Status: AC
  Administered 2023-04-25: 1 mL via EPIDURAL

## 2023-04-25 MED ORDER — METHYLPREDNISOLONE ACETATE 40 MG/ML INJ SUSP (RADIOLOG
80.0000 mg | Freq: Once | INTRAMUSCULAR | Status: AC
  Administered 2023-04-25: 80 mg via EPIDURAL

## 2023-04-26 ENCOUNTER — Encounter: Payer: Self-pay | Admitting: Physician Assistant

## 2023-04-29 ENCOUNTER — Encounter: Payer: Self-pay | Admitting: Physical Therapy

## 2023-04-29 ENCOUNTER — Ambulatory Visit: Payer: BC Managed Care – PPO | Admitting: Physical Therapy

## 2023-04-29 DIAGNOSIS — M62838 Other muscle spasm: Secondary | ICD-10-CM

## 2023-04-29 DIAGNOSIS — R293 Abnormal posture: Secondary | ICD-10-CM

## 2023-04-29 DIAGNOSIS — M6281 Muscle weakness (generalized): Secondary | ICD-10-CM

## 2023-04-29 DIAGNOSIS — M5412 Radiculopathy, cervical region: Secondary | ICD-10-CM

## 2023-04-29 DIAGNOSIS — G8929 Other chronic pain: Secondary | ICD-10-CM

## 2023-04-29 NOTE — Therapy (Signed)
OUTPATIENT PHYSICAL THERAPY TREATMENT/DISCHARGE   Patient Name: Cynthia [name] MRN: [id number] DOB:[birth year], 62 y.o., female Today's Date: 04/29/2023   PHYSICAL THERAPY DISCHARGE SUMMARY  Visits from Start of Care: 13  Current functional level related to goals / functional outcomes: See below    Remaining deficits: See below    Education / Equipment: See below    Patient agrees to discharge. Patient goals were partially met. Patient is being discharged due to the patient's request.    END OF SESSION:  PT End of Session - 04/29/23 0826     Visit Number 13    Date for PT Re-Evaluation 05/08/23    Authorization Type BCBS    PT Start Time 0802    PT Stop Time 0842    PT Time Calculation (min) 40 min    Activity Tolerance Patient tolerated treatment well    Behavior During Therapy [facility] for tasks assessed/performed                        Past Medical History:  Diagnosis Date   Acoustic neuroma (HCC)    ADD (attention deficit disorder)    and OCD-on Vyvanse   Asthma    w/out status asthmaticus   Awareness under anesthesia    Balance problem    after acoustic neuroma excision   Chronic back pain    Complication of anesthesia    unrecognized difficult intubation   Constipation, chronic    IBS, CIC   Difficult airway    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   Acustic Neuroma - Brain tumor    GERD (gastroesophageal reflux disease)    History of diabetes mellitus    was prediabetes, off metformin   Iron deficiency anemia, unspecified 02/18/2013   Obesity    296 lbs (highest weight in 2012)    Tendonitis, Achilles, left    Past Surgical History:  Procedure Laterality Date   ABLATION     and bladder sling   ACHILLES TENDON REPAIR     x2   ANAL FISSURE REPAIR  2012   BACK SURGERY     CHOLECYSTECTOMY     COLONOSCOPY WITH PROPOFOL N/A 01/20/2021   Procedure: COLONOSCOPY WITH PROPOFOL;  Surgeon: Jeani Hawking, MD;  Location: [facility];  Service: Endoscopy;   Laterality: N/A;   CRANIOTOMY  09/13/2015   with excision of acoustic neuroma   ESOPHAGOGASTRODUODENOSCOPY (EGD) WITH PROPOFOL N/A 07/22/2020   Procedure: ESOPHAGOGASTRODUODENOSCOPY (EGD) WITH PROPOFOL;  Surgeon: Jeani Hawking, MD;  Location: [facility];  Service: Endoscopy;  Laterality: N/A;   IMPLANTATION BONE ANCHORED HEARING AID     MOUTH SURGERY  2015   3 implants   OVARIAN CYST REMOVAL     x4   THYROID LOBECTOMY Right 04/28/2015   Partial    TOE FUSION Left    Patient Active Problem List   Diagnosis Date Noted   Class 1 obesity due to excess calories without serious comorbidity with body mass index (BMI) of 30.0 to 30.9 in adult 03/08/2023   Non-restorative sleep 03/08/2023   Post-viral cough syndrome 05/30/2022   Strain of lumbar region 02/05/2022   No energy 11/29/2021   History of sleep apnea 11/29/2021   Orthostatic hypotension 08/14/2021   Irritation of left radial nerve 03/16/2021   Right-sided chest wall pain 11/28/2020   History of endometrial ablation 10/28/2020   History of partial thyroidectomy 10/28/2020   Chronic gastritis without bleeding 05/27/2020   Hiatal hernia 05/27/2020   Nausea 05/27/2020  Tremor of right hand 02/26/2020   Hearing loss of right ear 11/24/2019   Basal cell carcinoma of left upper arm 10/21/2019   Atrophic vaginitis 06/03/2019   Family history of osteoporosis 03/05/2019   Post-menopausal 03/05/2019   Motion sickness 12/26/2017   Compression of right radial nerve 06/26/2017   Fatty liver disease, nonalcoholic 03/26/2017   Left renal mass 03/26/2017   Obesity (BMI 30.0-34.9) 03/26/2017   Irritable 10/17/2016   Chronic left-sided low back pain 09/19/2016   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HCC) 12/06/2015   Deafness in left ear 12/06/2015   Hashimoto's thyroiditis 06/14/2015   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   Hypokalemia 02/13/2015   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HCC) 02/11/2015   HSV infection 08/26/2014   ADD (attention deficit  disorder) 05/11/2014   Chronic constipation 05/11/2014   Essential (primary) hypertension 05/11/2014   Gastro-esophageal reflux disease without esophagitis 05/11/2014   Asthma, mild persistent 05/11/2014   Interdigital neuralgia of foot 04/14/2013   Iron deficiency anemia, unspecified 02/18/2013    PCP: Jomarie Longs, PA-C   REFERRING PROVIDER: Donalee Citrin, MD   REFERRING DIAG:  M54.40 (ICD-10-CM) - Lumbago with sciatica, unspecified side  M54.12 (ICD-10-CM) - Cervical radiculopathy at C6   THERAPY DIAG:  Radiculopathy, cervical region  Chronic bilateral low back pain with left-sided sciatica  Abnormal posture  Muscle weakness (generalized)  Other muscle spasm  RATIONALE FOR EVALUATION AND TREATMENT: Rehabilitation  ONSET DATE: ~6 months for neck pain, chronic LBP  NEXT MD VISIT: Unknown   SUBJECTIVE:                                                                                                                                                                                                         SUBJECTIVE STATEMENT:  Had a shot for my sciatica last Thursday it was great. Feeling good now. Dry needling helped my back. Shoulder has been feeling good. Would like to focus more on my neck more this morning. Tingling is better, right is worse today and more tight than the left. Back to the gym now, doing elliptical or TM for cardio and mild weights sometimes.   PAIN: Are you having pain? No 0/10    PERTINENT HISTORY:  Acoustic neuroma - deafness in L ear, ADD, asthma, balance problem (after acoustic neuroma), chronic back pain, GERD, DM, orthostatic hypotension, Hashimoto's thyroiditis  PRECAUTIONS: None  HAND DOMINANCE: Right  RED FLAGS: None  WEIGHT BEARING RESTRICTIONS: No  FALLS:  Has patient fallen in last 6 months? Yes. 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 Advanced degenerative foraminal impingement on the left at C5-6. 2. Moderate degenerative foraminal narrowing on the right at C6-7. 3. Diffusely patent spinal canal.  01/20/23 - MRI Lumbar spine: IMPRESSION: 1. Generalized lumbar spine degeneration greatest at L5-S1. No significant change when compared to 2022. 2. L5-S1 left foraminal impingement. Noncompressive bilateral foraminal narrowing at L2-3. 3. Diffusely patent spinal canal.  PATIENT SURVEYS:  Modified Oswestry 10 / 50 = 20.0 %; 04/29/23- 3/50  NDI 9 / 50 = 18.0 %; 04/29/23 5/50  SCREENING FOR RED FLAGS: Bowel or bladder incontinence: No Spinal tumors: No Cauda equina syndrome: No Compression fracture: No Abdominal aneurysm: No  COGNITION: Overall cognitive status: Within functional limits for tasks assessed     SENSATION: [facility] Intermittent L UE numbness and tingling, L LE sciatica and tingling  MUSCLE LENGTH: Hamstrings: Mild tight B; 04/29/23 mild tightness B  ITB: Mild tight B; 04/29/23- DNT  Piriformis: Mild tight B; 04/29/23- mild tightness B  Hip IR: Mod/severe tight B; 04/29/23- DNT  Hip flexors: Mild/mod tight L>R; 04/29/23- Mod limitation Quads: WFL; 11/24- Mod limitation   POSTURE:  rounded shoulders, forward head, decreased lumbar lordosis, and increased thoracic kyphosis  PALPATION: TTP with increased muscle tension in L>R cervicothoracic  and lumbar paraspinals, upper shoulder and periscapular muscles, as well as glutes/piriformis  CERVICAL ROM:   Active ROM Eval 04/15/23 04/29/23  Flexion 34 ^ 51 62*  Extension 44 - made tingling go away 50 35*  Right lateral flexion 30 35 30*  Left lateral flexion 34 40 40*  Right rotation 63 78 80*  Left rotation 51 - triggered tingling 72 80*    (Blank rows = not tested, ^ - increased pain)  UPPER EXTREMITY ROM:  Active ROM Right eval Left eval R 04/15/23 L 04/15/23  Shoulder flexion 142 124 145 144  Shoulder extension 49 42 49 52  Shoulder abduction 161 148 172 165  Shoulder adduction      Shoulder internal rotation FIR WNL FIR WNL FIR WNL FIR WNL  Shoulder external rotation FER T3 FER T1 FER T3 FER T2   (Blank rows = not tested)  UPPER EXTREMITY MMT:  MMT Right eval Left eval R 04/15/23 L 04/15/23  Shoulder flexion 4+ 4 5 5   Shoulder extension 4+ 4 5 5   Shoulder abduction 4+ 4 5 5   Shoulder adduction      Shoulder internal rotation 4+ 4- 5 5  Shoulder external rotation 4 4- 4+ 4+  Middle trapezius 4- 4- 4+ 4+  Lower trapezius 3+ 3+ 4 4  (Blank rows = not tested)  LUMBAR ROM:   Active  Eval 04/04/23 04/29/23  Flexion Hands to mid shins Hands to lower shins WNL   Extension 25% limited 15% limited WNL   Right lateral flexion Hand to fibular head Fibular head WNL  Left lateral flexion Hand to lateral knee Lateral knee WNL   Right rotation [facility]    Left rotation WFL      (Blank rows = not tested)  LOWER EXTREMITY ROM:    B LE AROM grossly [facility]  LOWER EXTREMITY MMT:    MMT Right eval Left eval R 04/11/23 L 04/11/23 R 04/29/23 L 04/29/23  Hip flexion 4 4- 4+ 4+ 5 5  Hip extension 4+ 4+ 4+ 4+ 4 4+  Hip abduction 4 3+ 4+ 4- 5 5  Hip adduction 4- 4 4 4     Hip internal rotation 5 4+ 5 4+    Hip external rotation 4 4- 4+ 4    Knee flexion 5 4+   5 5  Knee extension 5 4 5  4+ 5 5  Ankle dorsiflexion 5 5      Ankle plantarflexion        Ankle inversion         Ankle eversion          (Blank rows = not tested)  FUNCTIONAL TESTS:  Functional gait assessment: 21/30, 19-24 = medium risk fall (03/22/23)   TODAY'S TREATMENT:    04/29/23  TherEx  Nustep L4x6 minutes for w/u and tissue perfusion  Updated cervical ROM measures Chin tucks x12  Chin tucks + rotation x10 B Chin tucks + lateral flexion x10 B  Education about DC today, HEP updates, goal review, will need updated MD cert to return if any new issues arise.       04/22/23 THERAPEUTIC EXERCISE: to improve flexibility, strength and mobility.  Demonstration, verbal and tactile cues throughout for technique.  NuStep - L5 x 6 min (UE/LE) Standing lateral lean L ITB stretch with counter support x 30" Standing lateral lean L ITB stretch into wall with elbow support on wall x 30" - pt noting better stretch than latter Supine cross-body L ITB stretch with strap 3 x 30" - pt reporting best stretch with this version Quadruped alt R/L hip ABD & ER 3 x 10 S/L L hip ABD + slight extension 2 x 10 Standing 45 L hip ABD + extension with looped RTB at ankles 2 x 10 Standing L hip ABD + ER 2 x 10   04/18/23 THERAPEUTIC EXERCISE: to improve flexibility, strength and mobility.  Demonstration, verbal and tactile cues throughout for technique.  Rec Bike L2x77min Seated L sciatic nerve glide 2x10 Supine sciatic nerve glide with strap 2x10 Prone press ups x 10; POE x 30 seconds Prone hip extension x 10 bil Seated R/L piriformis stretch 2x30"    MANUAL THERAPY: To promote normalized muscle tension, improved flexibility, and reduced pain. IASTM with massage gun to L piriformis   04/15/23 THERAPEUTIC EXERCISE: to improve flexibility, strength and mobility.  Demonstration, verbal and tactile cues throughout for technique.  NuStep - L6 x 6 min (UE/LE) Seated L sciatic nerve glide/flossing x 10 Hooklying L sciatic nerve glide/flossing x 10  MANUAL THERAPY: To promote normalized muscle tension,  improved flexibility, and reduced pain. Skilled palpation and monitoring of soft tissue during DN Trigger Point Dry-Needling  Treatment instructions: Expect mild to moderate muscle soreness. S/S of pneumothorax if dry needled over a lung field, and to seek immediate medical attention should they occur. Patient verbalized understanding of these instructions and education. Patient Consent Given: Yes Education handout provided: Previously provided Muscles treated: L medial & lateral glute medius, glute minimus Electrical stimulation performed: No Parameters: N/A Treatment response/outcome: Twitch Response Elicited and Palpable  Increase in Muscle Length STM/DTM, manual TPR and pin & stretch to muscles addressed with DN  THERAPEUTIC ACTIVITIES: Cervical & shoulder ROM assessment UE MMT Goal assessment   PATIENT EDUCATION:  Education details: HEP update - ITB stretches and progression of hip ABD/ER strengthening    Person educated: Patient Education method: Explanation, Demonstration, Verbal cues, and MedBridgeGO app updated Education comprehension: verbalized understanding, returned demonstration, verbal cues required, and needs further education  HOME EXERCISE PROGRAM: Access Code: [email or web address] URL: [email or web address] Date: 04/29/2023 Prepared by: Nedra Hai  Exercises - Bridge with Hip Abduction and Resistance  - 1 x daily - 3 x weekly - 2 sets - 10 reps - 5 sec hold - Clam with Resistance  - 1 x daily - 3 x weekly - 2 sets - 10 reps - 3-5 sec hold - Cat Cow  - 2 x daily - 7 x weekly - 2 sets - 10 reps - 5 sec hold - Quadruped Alternating Leg Extensions  - 1 x daily - 3 x weekly - 2 sets - 10 reps - 3 sec hold - Cervical Extension AROM with Strap  - 1 x daily - 7 x weekly - 2 sets - 10 reps - Seated Assisted Cervical Rotation with Towel  - 1 x daily - 7 x weekly - 2 sets - 10 reps - Supine Figure 4 Piriformis Stretch (Mirrored)  - 2 x daily - 7 x weekly - 3 reps - 30  sec hold - Supine Piriformis Stretch with Foot on Ground (Mirrored)  - 2 x daily - 7 x weekly - 3 reps - 30 sec hold - Gastroc Stretch on Step  - 2 x daily - 7 x weekly - 2 sets - 30 sec hold - Hip Hiking on Step  - 1 x daily - 3 x weekly - 2 sets - 10 reps - Seated Sciatic Tensioner  - 1 x daily - 7 x weekly - 2- sets - 10 reps - Supine Sciatic Nerve Mobilization with Strap  - 1 x daily - 7 x weekly - 2-3 sets - 10 reps - Supine Iliotibial Band Stretch with Strap (Mirrored)  - 2-3 x daily - 7 x weekly - 3 reps - 30 sec hold - Sidelying Hip Abduction and Extension with Loop Band  - 1 x daily - 3 x weekly - 2 sets - 10 reps - 3 sec hold - Quadruped Hip Abduction and External Rotation  - 1 x daily - 3 x weekly - 2 sets - 10 reps - 3 sec hold - Frogger  - 1 x daily - 3 x weekly - 2 sets - 10 reps - 3 sec hold - Seated Cervical Retraction  - 1 x daily - 7 x weekly - 3 sets - 10 reps - Seated Cervical Retraction and Rotation  - 1 x daily - 7 x weekly - 3 sets - 10 reps - Standing Cervical Retraction with Sidebending  - 1 x daily - 7 x weekly - 3 sets - 10 reps - Scapular Retraction with Resistance  - 1 x daily - 7 x weekly - 3 sets - 10 reps - Shoulder extension with resistance - Neutral  - 1 x daily - 7 x weekly - 3 sets - 10 reps  Patient Education - Trigger Point Dry Needling  ASSESSMENT:  CLINICAL IMPRESSION:  Pt arrives today doing OK, had shot for sciatic pain and legs/back are really feeling good this morning. Requested we focus on neck  pain, also updated objectives for DC. Requesting DC today, really feeling good and like she has met her desired goals with PT. HEP updates provided as appropriate.       OBJECTIVE IMPAIRMENTS: Abnormal gait, decreased activity tolerance, decreased balance, decreased coordination, decreased endurance, decreased knowledge of condition, decreased mobility, difficulty walking, decreased ROM, decreased strength, dizziness, increased fascial restrictions,  impaired perceived functional ability, increased muscle spasms, impaired flexibility, impaired sensation, impaired UE functional use, improper body mechanics, postural dysfunction, and pain.   ACTIVITY LIMITATIONS: carrying, lifting, bending, squatting, sleeping, transfers, bed mobility, reach over head, and locomotion level  PARTICIPATION LIMITATIONS: meal prep, cleaning, laundry, driving, shopping, community activity, and occupation  PERSONAL FACTORS: Fitness, Past/current experiences, Time since onset of injury/illness/exacerbation, and 3+ comorbidities: Acoustic neuroma - deafness in L ear, ADD, asthma, balance problem (after acoustic neuroma), chronic back pain, GERD, DM, orthostatic hypotension, Hashimoto's thyroiditis  are also affecting patient's functional outcome.   REHAB POTENTIAL: Good  CLINICAL DECISION MAKING: Evolving/moderate complexity  EVALUATION COMPLEXITY: Moderate   GOALS: Goals reviewed with patient? Yes  SHORT TERM GOALS: Target date: 04/10/2023  Patient will be independent with initial HEP to improve outcomes and carryover.  Baseline: Initial HEP provided 03/18/2023 Goal status: MET - 03/26/23   2.  Patient will report reduced frequency and intensity and/or centralization of radicular symptoms.  Baseline: L UE radicular numbness and tingling into radial side of hand, L>R LE radicular pain and tingling into distal LE Goal status: PARTIALLY MET - 04/29/23- LE sx resolved, UE symptoms intermittent   3.  Complete FGA. Baseline: TBA Goal status: MET - 03/22/23  LONG TERM GOALS: Target date: 05/08/2023  Patient will be independent with ongoing/advanced HEP for self-management at home.  Baseline:  Goal status:  MET - 04/29/23  2.  Patient will report 50-75% improvement in neck and back pain as well as associated radicular pain/numbness/tingling to improve QOL.  Baseline: L>R neck & upper shoulder up to 5/10 with frequent L UE radicular numbness and tingling into  radial side of hand, LBP up to 8/10 with L>R LE radicular pain and tingling into distal LE Goal status:  MET - 04/29/23  3.  Patient will demonstrate improved posture to decrease muscle imbalance. Baseline: Severely forward head and rounded shoulder posture with increased thoracic kyphosis and flattening/reversal of lumbar lordosis Goal status: PARTIALLY MET - 04/29/23 - still requires occasional cues to correct but able to achieve good alignment   4.  Patient to demonstrate ability to achieve and maintain good spinal alignment and body mechanics needed for daily activities. Baseline:  Goal status: PARTIALLY MET 04/29/23  5.  Patient will demonstrate functional pain free cervical ROM for safety with driving.  Baseline: Refer to above cervical ROM table Goal status: MET - 04/15/23  6.  Patient will demonstrate functional pain free lumbar ROM to perform ADLs.   Baseline: Refer to above lumbar ROM table Goal status: MET - 04/29/23  7.  Patient will demonstrate improved functional strength as demonstrated by overall B proximal UE and LE MMT >/= 4+/5. Baseline: Refer to above UE and LE MMT tables Goal status: MET - 05/12/23  8.  Patient will report </= 8% on modified Oswestry to demonstrate improved functional ability.  Baseline: 10 / 50 = 20.0 % Goal status: NOT MET  04/29/23  9.  Patient will report </= 5% on NDI to demonstrate improved functional ability.  Baseline: 9 / 50 = 18.0 % Goal status: MET 04/29/23  10.  Patient  to report ability to perform ADLs, household, and work-related tasks without limitation due to neck or back pain, radiculopathy, LOM or weakness. Baseline:  Goal status:  MET - 05/12/23  11.  Patient will improve FGA score to >/= 25/30 to improve gait stability and reduce risk for falls. Baseline: 21/30 Goal status: NOT MET 04/29/23    PLAN:  PT FREQUENCY: 2x/week  PT DURATION: 8 weeks  PLANNED INTERVENTIONS: Therapeutic exercises, Therapeutic activity,  Neuromuscular re-education, Balance training, Gait training, Patient/Family education, Self Care, Joint mobilization, DME instructions, Aquatic Therapy, Dry Needling, Electrical stimulation, Spinal manipulation, Spinal mobilization, Cryotherapy, Moist heat, Taping, Traction, Ultrasound, Manual therapy, and Re-evaluation  PLAN FOR NEXT SESSION:  DC today per pt request   Nedra Hai, PT, DPT 04/29/23 8:56 AM

## 2023-05-01 ENCOUNTER — Other Ambulatory Visit: Payer: Self-pay | Admitting: Physician Assistant

## 2023-05-01 DIAGNOSIS — M545 Low back pain, unspecified: Secondary | ICD-10-CM

## 2023-05-08 ENCOUNTER — Encounter: Payer: BC Managed Care – PPO | Admitting: Physical Therapy

## 2023-05-13 ENCOUNTER — Other Ambulatory Visit: Payer: Self-pay | Admitting: Physician Assistant

## 2023-05-13 DIAGNOSIS — I10 Essential (primary) hypertension: Secondary | ICD-10-CM

## 2023-05-29 ENCOUNTER — Other Ambulatory Visit: Payer: Self-pay | Admitting: Physician Assistant

## 2023-05-29 DIAGNOSIS — F900 Attention-deficit hyperactivity disorder, predominantly inattentive type: Secondary | ICD-10-CM

## 2023-05-30 NOTE — Telephone Encounter (Signed)
Per patient -   Doing fine with medicine.   Need the next three refills that will get me to my 6 month checkup in March!     Last OV: 03/08/23 Next OV: 09/06/23 Last RF: 05/07/23

## 2023-05-31 MED ORDER — AMPHETAMINE-DEXTROAMPHET ER 30 MG PO CP24
30.0000 mg | ORAL_CAPSULE | Freq: Every day | ORAL | 0 refills | Status: DC
Start: 2023-05-31 — End: 2023-09-06

## 2023-05-31 MED ORDER — AMPHETAMINE-DEXTROAMPHET ER 30 MG PO CP24
30.0000 mg | ORAL_CAPSULE | Freq: Every day | ORAL | 0 refills | Status: DC
Start: 2023-06-30 — End: 2023-09-06

## 2023-05-31 MED ORDER — AMPHETAMINE-DEXTROAMPHET ER 30 MG PO CP24
30.0000 mg | ORAL_CAPSULE | Freq: Every day | ORAL | 0 refills | Status: DC
Start: 2023-07-30 — End: 2023-09-06

## 2023-06-05 ENCOUNTER — Ambulatory Visit: Payer: Self-pay | Admitting: Physician Assistant

## 2023-06-05 ENCOUNTER — Encounter: Payer: Self-pay | Admitting: Physician Assistant

## 2023-06-05 DIAGNOSIS — G4733 Obstructive sleep apnea (adult) (pediatric): Secondary | ICD-10-CM | POA: Insufficient documentation

## 2023-06-05 NOTE — Telephone Encounter (Signed)
Copied from CRM [id number]. Topic: Clinical - Lab/Test Results >> Jun 05, 2023  3:08 PM Carloyn Manner C wrote: Reason for CRM: Patient is calling back a missed call that stated to give a call back for her test results. Transferring to Nurse Triage. Reason for Disposition . [1] Follow-up call to recent contact AND [2] information only call, no triage required  Answer Assessment - Initial Assessment Questions 1. REASON FOR CALL or QUESTION: "What is your reason for calling today?" or "How can I best help you?" or "What question do you have that I can help answer?"     Calling about lab results, per note in chart pt was scheduled for VV with PCP to discuss sleep study results.  Protocols used: Information Only Call - No Triage-A-AH

## 2023-06-06 NOTE — Telephone Encounter (Signed)
Patient scheduled for 06/14/23 to discuss home sleep study results.

## 2023-06-06 NOTE — Telephone Encounter (Signed)
Patient states she did not call to request any lab results.

## 2023-06-14 ENCOUNTER — Telehealth (INDEPENDENT_AMBULATORY_CARE_PROVIDER_SITE_OTHER): Payer: BC Managed Care – PPO | Admitting: Physician Assistant

## 2023-06-14 VITALS — Temp 97.7°F | Ht 68.0 in | Wt 192.0 lb

## 2023-06-14 DIAGNOSIS — G4730 Sleep apnea, unspecified: Secondary | ICD-10-CM | POA: Diagnosis not present

## 2023-06-14 DIAGNOSIS — R944 Abnormal results of kidney function studies: Secondary | ICD-10-CM | POA: Diagnosis not present

## 2023-06-14 DIAGNOSIS — E663 Overweight: Secondary | ICD-10-CM | POA: Insufficient documentation

## 2023-06-14 DIAGNOSIS — Z8639 Personal history of other endocrine, nutritional and metabolic disease: Secondary | ICD-10-CM

## 2023-06-14 DIAGNOSIS — E876 Hypokalemia: Secondary | ICD-10-CM

## 2023-06-14 DIAGNOSIS — E063 Autoimmune thyroiditis: Secondary | ICD-10-CM

## 2023-06-14 MED ORDER — NALTREXONE-BUPROPION HCL ER 8-90 MG PO TB12: 2.0000 | ORAL_TABLET | Freq: Two times a day (BID) | ORAL | 2 refills | Status: DC

## 2023-06-14 NOTE — Progress Notes (Signed)
Virtual Visit via Video Note  I connected with Cynthia [name] on 06/14/23 at  9:10 AM EST by a video enabled telemedicine application and verified that I am speaking with the correct person using two identifiers.  Location: Patient: home Provider: clinic  .[name] KitchenParticipating in visit:  Patient: Cynthia [name] Provider: Tandy Gaw PA-C   I discussed the limitations of evaluation and management by telemedicine and the availability of in person appointments. The patient expressed understanding and agreed to proceed.  History of Present Illness: Pt calls into the clinic to go over sleep study.   She is doing well with contrave. She tolerates it well and lost 10lbs over the last month. She is happy with her results.     Observations/Objective: No acute distress Normal mood and appearance   Assessment and Plan: [name] KitchenMarland KitchenLadedra was seen today for medical management of chronic issues.  Diagnoses and all orders for this visit:  Mild sleep apnea -     Naltrexone-buPROPion HCl ER 8-90 MG TB12; Take 2 tablets by mouth 2 (two) times daily.  Overweight (BMI 25.0-29.9) -     Naltrexone-buPROPion HCl ER 8-90 MG TB12; Take 2 tablets by mouth 2 (two) times daily.  Decreased GFR -     CMP14+EGFR  Hypokalemia -     CMP14+EGFR  Hashimoto's thyroiditis -     TSH + free T4  History of obesity -     Naltrexone-buPROPion HCl ER 8-90 MG TB12; Take 2 tablets by mouth 2 (two) times daily.    Discussed sleep study results of Mild Sleep apnea with 22 apneas and 55 hypopneas. No O2 saturation below 88 percent.  Pt has lost 10lbs over the last month on contrave and has a history of being 300lbs and not needing CPAP any more Would like to work on weight loss and will discuss with dentist about oral appliance.  Refilled contrave Needs labs to follow up on GFR and thyroid  Follow up in 3 months   Follow Up Instructions:    I discussed the assessment and treatment plan with the patient. The patient was provided  an opportunity to ask questions and all were answered. The patient agreed with the plan and demonstrated an understanding of the instructions.   The patient was advised to call back or seek an in-person evaluation if the symptoms worsen or if the condition fails to improve as anticipated.  Tandy Gaw, PA-C

## 2023-06-19 LAB — CMP14+EGFR
ALT: 16 [IU]/L (ref 0–32)
AST: 18 [IU]/L (ref 0–40)
Albumin: 4.4 g/dL (ref 3.8–4.9)
Alkaline Phosphatase: 69 [IU]/L (ref 44–121)
BUN/Creatinine Ratio: 17 (ref 12–28)
BUN: 19 mg/dL (ref 8–27)
Bilirubin Total: 0.7 mg/dL (ref 0.0–1.2)
CO2: 25 mmol/L (ref 20–29)
Calcium: 8.7 mg/dL (ref 8.7–10.3)
Chloride: 101 mmol/L (ref 96–106)
Creatinine, Ser: 1.09 mg/dL — ABNORMAL HIGH (ref 0.57–1.00)
Globulin, Total: 2 g/dL (ref 1.5–4.5)
Glucose: 87 mg/dL (ref 70–99)
Potassium: 3.8 mmol/L (ref 3.5–5.2)
Sodium: 142 mmol/L (ref 134–144)
Total Protein: 6.4 g/dL (ref 6.0–8.5)
eGFR: 58 mL/min/{1.73_m2} — ABNORMAL LOW (ref >59)

## 2023-06-19 LAB — TSH+FREE T4
Free T4: 1.61 ng/dL (ref 0.82–1.77)
TSH: 3.05 u[IU]/mL (ref 0.450–4.500)

## 2023-06-21 ENCOUNTER — Encounter: Payer: Self-pay | Admitting: Physician Assistant

## 2023-06-21 DIAGNOSIS — N183 Chronic kidney disease, stage 3 unspecified: Secondary | ICD-10-CM | POA: Insufficient documentation

## 2023-06-26 ENCOUNTER — Other Ambulatory Visit (HOSPITAL_COMMUNITY)
Admission: RE | Admit: 2023-06-26 | Discharge: 2023-06-26 | Disposition: A | Discharge status: Home / Self Care | Payer: BC Managed Care – PPO | Source: Ambulatory Visit | Attending: Obstetrics & Gynecology | Admitting: Obstetrics & Gynecology

## 2023-06-26 ENCOUNTER — Ambulatory Visit (HOSPITAL_BASED_OUTPATIENT_CLINIC_OR_DEPARTMENT_OTHER): Payer: BC Managed Care – PPO | Admitting: Obstetrics & Gynecology

## 2023-06-26 ENCOUNTER — Encounter (HOSPITAL_BASED_OUTPATIENT_CLINIC_OR_DEPARTMENT_OTHER): Payer: Self-pay | Admitting: Obstetrics & Gynecology

## 2023-06-26 VITALS — BP 130/50 | HR 72 | Ht 67.0 in | Wt 199.0 lb

## 2023-06-26 DIAGNOSIS — Z01419 Encounter for gynecological examination (general) (routine) without abnormal findings: Secondary | ICD-10-CM

## 2023-06-26 DIAGNOSIS — Z780 Asymptomatic menopausal state: Secondary | ICD-10-CM | POA: Diagnosis not present

## 2023-06-26 DIAGNOSIS — Z124 Encounter for screening for malignant neoplasm of cervix: Secondary | ICD-10-CM | POA: Diagnosis present

## 2023-06-26 DIAGNOSIS — A6004 Herpesviral vulvovaginitis: Secondary | ICD-10-CM

## 2023-06-26 MED ORDER — VALACYCLOVIR HCL 500 MG PO TABS: ORAL_TABLET | ORAL | 1 refills | Status: DC

## 2023-06-26 NOTE — Progress Notes (Signed)
 63 y.o. G0P0000 Married White or Caucasian female here for annual exam.  Doing well.  Having some back issues and had some issues with left arm numbness.  She is doing PT and having dry needling and this really helped.  She exercises regularly as well.  Her back is stable so this is good.  Denies vaginal bleeding.  Does need valtrex  RF.  Has needed some this past year.  Patient's last menstrual period was 06/18/2006 (approximate).          Sexually active: Yes.    Smoker:  no  Health Maintenance: Pap:  10/28/2020 Negative History of abnormal Pap:  no MMG:  11/21/2022 Negative Colonoscopy:  01/20/2021, follow up 10 years BMD:   2021. Normal. Screening Labs: done with PCP   reports that she has never smoked. She has never used smokeless tobacco. She reports current alcohol use of about 1.0 - 2.0 standard drink of alcohol per week. She reports that she does not use drugs.  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   Acustic Neuroma - Brain tumor    GERD (gastroesophageal reflux disease)    History of diabetes mellitus    was prediabetes, off metformin   Iron deficiency anemia, unspecified 02/18/2013   Obesity    296 lbs (highest weight in 2012)    Tendonitis, Achilles, left     Past Surgical History:  Procedure Laterality Date   ABLATION     and bladder sling   ACHILLES TENDON REPAIR     x2   ANAL FISSURE REPAIR  2012   BACK SURGERY     CHOLECYSTECTOMY     COLONOSCOPY WITH PROPOFOL  N/A 01/20/2021   Procedure: COLONOSCOPY WITH PROPOFOL ;  Surgeon: Rollin Dover, MD;  Location: [facility];  Service: Endoscopy;   Laterality: N/A;   CRANIOTOMY  09/13/2015   with excision of acoustic neuroma   ESOPHAGOGASTRODUODENOSCOPY (EGD) WITH PROPOFOL  N/A 07/22/2020   Procedure: ESOPHAGOGASTRODUODENOSCOPY (EGD) WITH PROPOFOL ;  Surgeon: Rollin Dover, MD;  Location: [facility];  Service: Endoscopy;  Laterality: N/A;   IMPLANTATION BONE ANCHORED HEARING AID     MOUTH SURGERY  2015   3 implants   OVARIAN CYST REMOVAL     x4   THYROID  LOBECTOMY Right 04/28/2015   Partial    TOE FUSION Left     Medications:  reviewed in EPIC   Family History  Problem Relation Age of Onset   Thyroid  disease Mother    Heart Problems Mother        vavle issue   Dystonia Mother    Hypercholesterolemia Mother    Heart attack Father    Cancer Father        esophageal   Heart disease Father    Lung cancer Paternal Grandfather    Heart disease Paternal Grandfather    Heart disease Maternal Grandfather    Heart disease Paternal Grandmother    Heart disease Other        paternal and maternal side   Cancer Brother  esophageal/stomach cancer    ROS: Constitutional: negative Genitourinary:negative  Exam:   BP (!) 130/50 (BP Location: Left Arm, Patient Position: Sitting, Cuff Size: Large)   Pulse 72   Ht 5' 7 (1.702 m)   Wt 199 lb (90.3 kg)   LMP 06/18/2006 (Approximate)   BMI 31.17 kg/m   Height: 5' 7 (170.2 cm)  General appearance: alert, cooperative and appears stated age Head: Normocephalic, without obvious abnormality, atraumatic Neck: no adenopathy, supple, symmetrical, trachea midline and thyroid  normal to inspection and palpation Lungs: clear to auscultation bilaterally Breasts: normal appearance, no masses or tenderness Heart: regular rate and rhythm Abdomen: soft, non-tender; bowel sounds normal; no masses,  no organomegaly Extremities: extremities normal, atraumatic, no cyanosis or edema Skin: Skin color, texture, turgor normal. No rashes or lesions Lymph nodes: Cervical, supraclavicular, and  axillary nodes normal. No abnormal inguinal nodes palpated Neurologic: Grossly normal   Pelvic: External genitalia:  no lesions              Urethra:  normal appearing urethra with no masses, tenderness or lesions              Bartholins and Skenes: normal                 Vagina: normal appearing vagina with normal color and no discharge, no lesions              Cervix: no lesions              Pap taken: Yes.   Bimanual Exam:  Uterus:  normal size, contour, position, consistency, mobility, non-tender              Adnexa: normal adnexa and no mass, fullness, tenderness               Rectovaginal: Confirms               Anus:  normal sphincter tone, no lesions  Chaperone, Bascom Kotyk, CMA, was present for exam.  Assessment/Plan: 1. Well woman exam with routine gynecological exam (Primary) - Pap smear obtained today - Mammogram 11/21/2022 - Colonoscopy 01/20/2021, follow up 10 years - Bone mineral density 2021.  Normal.  Repeat next year. - lab work done with PCP, Vermell Bologna - vaccines reviewed/updated  2. Cervical cancer screening - Cytology - PAP( [facility])  3. Postmenopausal - not on HRT  4. Herpes simplex vulvovaginitis - valACYclovir  (VALTREX ) 500 MG tablet; With new symptoms, take 1 tab BID x 3 days  Dispense: 30 tablet; Refill: 1

## 2023-06-28 LAB — CYTOLOGY - PAP
Comment: NEGATIVE
Diagnosis: NEGATIVE
High risk HPV: NEGATIVE

## 2023-07-09 ENCOUNTER — Other Ambulatory Visit (HOSPITAL_BASED_OUTPATIENT_CLINIC_OR_DEPARTMENT_OTHER): Payer: Self-pay | Admitting: Obstetrics & Gynecology

## 2023-07-09 DIAGNOSIS — A6004 Herpesviral vulvovaginitis: Secondary | ICD-10-CM

## 2023-08-03 ENCOUNTER — Other Ambulatory Visit: Payer: Self-pay | Admitting: Physician Assistant

## 2023-09-06 ENCOUNTER — Ambulatory Visit: Payer: BC Managed Care – PPO | Admitting: Physician Assistant

## 2023-09-06 ENCOUNTER — Encounter: Payer: Self-pay | Admitting: Physician Assistant

## 2023-09-06 VITALS — BP 102/69 | HR 87 | Ht 65.0 in | Wt 195.8 lb

## 2023-09-06 DIAGNOSIS — I10 Essential (primary) hypertension: Secondary | ICD-10-CM | POA: Diagnosis not present

## 2023-09-06 DIAGNOSIS — F900 Attention-deficit hyperactivity disorder, predominantly inattentive type: Secondary | ICD-10-CM

## 2023-09-06 DIAGNOSIS — N1831 Chronic kidney disease, stage 3a: Secondary | ICD-10-CM | POA: Diagnosis not present

## 2023-09-06 DIAGNOSIS — E063 Autoimmune thyroiditis: Secondary | ICD-10-CM | POA: Diagnosis not present

## 2023-09-06 DIAGNOSIS — R944 Abnormal results of kidney function studies: Secondary | ICD-10-CM

## 2023-09-06 MED ORDER — AMPHETAMINE-DEXTROAMPHET ER 30 MG PO CP24
30.0000 mg | ORAL_CAPSULE | Freq: Every day | ORAL | 0 refills | Status: DC
Start: 2023-10-06 — End: 2023-12-06

## 2023-09-06 MED ORDER — DAPAGLIFLOZIN PROPANEDIOL 10 MG PO TABS: 10.0000 mg | ORAL_TABLET | Freq: Every day | ORAL | 0 refills | Status: DC

## 2023-09-06 MED ORDER — LEVOTHYROXINE SODIUM 88 MCG PO TABS: 88.0000 ug | ORAL_TABLET | Freq: Every day | ORAL | 1 refills | Status: DC

## 2023-09-06 MED ORDER — AMPHETAMINE-DEXTROAMPHET ER 30 MG PO CP24: 30.0000 mg | ORAL_CAPSULE | Freq: Every day | ORAL | 0 refills | Status: DC

## 2023-09-06 MED ORDER — TRIAMTERENE-HCTZ 37.5-25 MG PO TABS: 1.0000 | ORAL_TABLET | Freq: Every day | ORAL | 3 refills | Status: DC

## 2023-09-06 NOTE — Patient Instructions (Signed)
 Dapagliflozin Tablets What is this medication? DAPAGLIFLOZIN (DAP a gli FLOE zin) treats type 2 diabetes. It works by helping your kidneys remove sugar (glucose) from your blood through the urine, which decreases your blood sugar. It may also be used to lower the risk of worsening disease and death caused by kidney disease and heart failure. It works by helping your kidneys remove salt (sodium) from your blood through the urine. This decreases the amount of work the kidneys and heart have to do. Changes to diet and exercise are often combined with this medication. This medicine may be used for other purposes; ask your health care provider or pharmacist if you have questions. COMMON BRAND NAME(S): Marcelline Deist What should I tell my care team before I take this medication? They need to know if you have any of these conditions: Change in diet, eating less Changes to your insulin dose Dehydration Diet low in salt Frequently drink alcohol Having surgery History of diabetic ketoacidosis (DKA) History of genital infections History of pancreatitis or pancreas problems History of urinary tract infections (UTI) Kidney disease Liver disease Serious infection Trouble passing urine Type 1 diabetes An unusual or allergic reaction to dapagliflozin, other medications, foods, dyes, or preservatives Pregnant or trying to get pregnant Breastfeeding How should I use this medication? Take this medication by mouth with water. Take it as directed on the prescription label at the same time every day. You can take it with or without food. If it upsets your stomach, take it with food. Keep taking it unless your care team tells you to stop. A special MedGuide will be given to you by the pharmacist with each prescription and refill. Be sure to read this information carefully each time. Talk to your care team about the use of this medication in children. While it may be prescribed for children as young as 10 years for  selected conditions, precautions do apply. Overdosage: If you think you have taken too much of this medicine contact a poison control center or emergency room at once. NOTE: This medicine is only for you. Do not share this medicine with others. What if I miss a dose? If you miss a dose, take it as soon as you can. If it is almost time for your next dose, take only that dose. Do not take double or extra doses. What may interact with this medication? Lithium Sulfonylureas, such as glimepiride, glipizide, glyburide This list may not describe all possible interactions. Give your health care provider a list of all the medicines, herbs, non-prescription drugs, or dietary supplements you use. Also tell them if you smoke, drink alcohol, or use illegal drugs. Some items may interact with your medicine. What should I watch for while using this medication? Visit your care team for regular checks on your progress. Tell your care team if your symptoms do not start to get better or if they get worse. You may need blood work done while you are taking this medication. Your care team will monitor your HbA1C (A1C). This test shows what your average blood sugar (glucose) level was over the past 2 to 3 months. Know the symptoms of low blood sugar and know how to treat it. Always carry a source of quick sugar with you. Examples include hard sugar candy or glucose tablets. Make sure others know that you can choke if you eat or drink if your blood sugar is too low and you are unable to care for yourself. Get medical help at once. Tell your  care team if you have high blood sugar. Your medication dose may change if your body is under stress. Some types of stress that may affect your blood sugar include fever, infection, and surgery. This medication may increase the risk of diabetic ketoacidosis (DKA), a serious condition in which high ketone levels make your blood too acidic. Your care team may ask you to check for ketones in  your urine or blood while you are taking this medication. If you develop nausea, vomiting, stomach pain, unusual weakness or fatigue, or trouble breathing, stop taking this medication and call your care team right away. Check with your care team if you have severe diarrhea, nausea, and vomiting, or if you sweat a lot. The loss of too much body fluid may make it dangerous for you to take this medication. Wear a medical ID bracelet or chain. Carry a card that describes your condition. List the medications and doses you take on the card. What side effects may I notice from receiving this medication? Side effects that you should report to your care team as soon as possible: Allergic reactions--skin rash, itching, hives, swelling of the face, lips, tongue, or throat Dehydration--increased thirst, dry mouth, feeling faint or lightheaded, headache, dark yellow or brown urine Diabetic ketoacidosis (DKA)--increased thirst or amount of urine, dry mouth, fatigue, fruity odor to breath, trouble breathing, stomach pain, nausea, vomiting Genital yeast infection--redness, swelling, pain, or itchiness, odor, thick or lumpy discharge New pain or tenderness, change in skin color, sores or ulcers, infection of the leg or foot Infection or redness, swelling, tenderness, or pain in the genitals, or area from the genitals to the back of the rectum Urinary tract infection (UTI)--burning when passing urine, passing frequent small amounts of urine, bloody or cloudy urine, pain in the lower back or sides This list may not describe all possible side effects. Call your doctor for medical advice about side effects. You may report side effects to FDA at 1-800-FDA-1088. Where should I keep my medication? Keep out of the reach of children and pets. Store at room temperature between 20 and 25 degrees C (68 and 77 degrees F). Get rid of any unused medication after the expiration date. To get rid of medications that are no longer  needed or have expired: Take the medication to a medication take-back program. Check with your pharmacy or law enforcement to find a location. If you cannot return the medication, check the label or package insert to see if the medication should be thrown out in the garbage or flushed down the toilet. If you are not sure, ask your care team. If it is safe to put it in the trash, take the medication out of the container. Mix the medication with cat litter, dirt, coffee grounds, or other unwanted substance. Seal the mixture in a bag or container. Put it in the trash. NOTE: This sheet is a summary. It may not cover all possible information. If you have questions about this medicine, talk to your doctor, pharmacist, or health care provider.  2024 Elsevier/Gold Standard (2022-12-03 00:00:00)

## 2023-09-07 LAB — BMP8+EGFR
BUN/Creatinine Ratio: 14 (ref 12–28)
BUN: 16 mg/dL (ref 8–27)
CO2: 24 mmol/L (ref 20–29)
Calcium: 9 mg/dL (ref 8.7–10.3)
Chloride: 100 mmol/L (ref 96–106)
Creatinine, Ser: 1.16 mg/dL — ABNORMAL HIGH (ref 0.57–1.00)
Glucose: 78 mg/dL (ref 70–99)
Potassium: 3.5 mmol/L (ref 3.5–5.2)
Sodium: 142 mmol/L (ref 134–144)
eGFR: 54 mL/min/{1.73_m2} — ABNORMAL LOW (ref >59)

## 2023-09-08 ENCOUNTER — Encounter: Payer: Self-pay | Admitting: Physician Assistant

## 2023-09-08 NOTE — Progress Notes (Signed)
 Established Patient Office Visit  Subjective   Patient ID: Cynthia [name], female    DOB: [birth year]  Age: 63 y.o. MRN: [id number]  Chief Complaint  Patient presents with   Medical Management of Chronic Issues    Adderall f/u also would like to discuss bloodwork for her kidney function    HPI Pt is a 63 yo female with hypothyroidism, HTN, ADHD, CKD 3 who presents to the clinic for medication refills.   Pt is doing well. Mood is good. Denies any CP, palpitations, headaches or vision changes. Pt is staying focused and work is going well. She is compliant with medications. She is concerned about decline in GFR. She would like to discuss what to do about it.   Review of Systems  All other systems reviewed and are negative.     Objective:     BP 102/69 (BP Location: Left Arm, Patient Position: Sitting, Cuff Size: Large)   Pulse 87   Ht 5\' 5"  (1.651 m)   Wt 195 lb 12 oz (88.8 kg)   LMP 06/18/2006 (Approximate)   SpO2 96%   BMI 32.57 kg/m  BP Readings from Last 3 Encounters:  09/06/23 102/69  06/26/23 (!) 130/50  04/25/23 (!) 148/87   Wt Readings from Last 3 Encounters:  09/06/23 195 lb 12 oz (88.8 kg)  06/26/23 199 lb (90.3 kg)  06/14/23 192 lb (87.1 kg)      Physical Exam Constitutional:      Appearance: Normal appearance. She is obese.  HENT:     Head: Normocephalic.  Cardiovascular:     Rate and Rhythm: Normal rate and regular rhythm.     Pulses: Normal pulses.     Heart sounds: Normal heart sounds.  Pulmonary:     Effort: Pulmonary effort is normal.     Breath sounds: Normal breath sounds.  Musculoskeletal:     Right lower leg: No edema.     Left lower leg: No edema.  Neurological:     General: No focal deficit present.     Mental Status: She is alert and oriented to person, place, and time.  Psychiatric:        Mood and Affect: Mood normal.        The 10-year ASCVD risk score (Arnett DK, et al., 2019) is: 2.2%    Assessment & Plan:  [name] KitchenMarland KitchenKris was  seen today for medical management of chronic issues.  Diagnoses and all orders for this visit:  Attention deficit hyperactivity disorder (ADHD), predominantly inattentive type -     amphetamine-dextroamphetamine (ADDERALL XR) 30 MG 24 hr capsule; Take 1 capsule (30 mg total) by mouth daily. -     amphetamine-dextroamphetamine (ADDERALL XR) 30 MG 24 hr capsule; Take 1 capsule (30 mg total) by mouth daily. -     amphetamine-dextroamphetamine (ADDERALL XR) 30 MG 24 hr capsule; Take 1 capsule (30 mg total) by mouth daily.  Essential (primary) hypertension -     triamterene-hydrochlorothiazide (MAXZIDE-25) 37.5-25 MG tablet; Take 1 tablet by mouth daily. -     BMP8+eGFR  Stage 3a chronic kidney disease (HCC) -     BMP8+eGFR -     dapagliflozin propanediol (FARXIGA) 10 MG TABS tablet; Take 1 tablet (10 mg total) by mouth daily.  Hashimoto's thyroiditis -     levothyroxine (SYNTHROID) 88 MCG tablet; Take 1 tablet (88 mcg total) by mouth daily.   Vitals look great.  Synthroid refilled based on 05/2023 labs.  Adderall refilled.  Maxide refilled.  Discuss  GFR, wax and wane CKD 3a Recheck BMP today Encouraged farxiga for protection Discussed keeping good BP control, making sugar staying hydrated, avoid diabetes.  Follow up in 3 months    Return in about 3 months (around 12/07/2023).    Tandy Gaw, PA-C

## 2023-09-09 ENCOUNTER — Encounter: Payer: Self-pay | Admitting: Physician Assistant

## 2023-09-09 NOTE — Progress Notes (Signed)
 GFR is 54 down from 58. I would start farxiga to help prevent any further kidney function loss. Recheck in 3 months.

## 2023-09-10 ENCOUNTER — Other Ambulatory Visit (HOSPITAL_COMMUNITY): Payer: Self-pay

## 2023-09-11 ENCOUNTER — Other Ambulatory Visit (HOSPITAL_COMMUNITY): Payer: Self-pay

## 2023-09-11 ENCOUNTER — Telehealth: Payer: Self-pay

## 2023-09-11 NOTE — Telephone Encounter (Signed)
 E2C2 agent transferred a call from [facility] prior [facility] department. Per ES Rep, Marcelline Deist is approved. Case # [id number]. Valid from 08/12/23 - 09/10/24. Patient notified via MyChart msg.

## 2023-09-11 NOTE — Telephone Encounter (Signed)
 Pharmacy Patient Advocate Encounter  Received notification from [facility] that Prior Authorization for Farxiga 10 mg tablets has been APPROVED from 09/06/23 to 09/05/24. Unable to obtain price due to refill too soon rejection, last fill date 0321/25 next available fill date05/27/25   PA #/Case ID/Reference #: --

## 2023-10-25 ENCOUNTER — Telehealth: Admitting: Physician Assistant

## 2023-10-25 ENCOUNTER — Ambulatory Visit: Payer: Self-pay

## 2023-10-25 DIAGNOSIS — N898 Other specified noninflammatory disorders of vagina: Secondary | ICD-10-CM

## 2023-10-25 MED ORDER — FLUCONAZOLE 150 MG PO TABS
150.0000 mg | ORAL_TABLET | Freq: Every day | ORAL | 0 refills | Status: DC
Start: 2023-10-25 — End: 2023-10-25

## 2023-10-25 MED ORDER — FLUCONAZOLE 150 MG PO TABS: 150.0000 mg | ORAL_TABLET | Freq: Every day | ORAL | 0 refills | Status: DC

## 2023-10-25 NOTE — Addendum Note (Signed)
 Addended by: Araceli Knight on: 10/25/2023 03:46 PM   Modules accepted: Orders

## 2023-10-25 NOTE — Telephone Encounter (Signed)
 Chief Complaint: Vaginal discharge Symptoms: itching Frequency: Onset this week Pertinent Negatives: Patient denies other symptoms Disposition: [] ED /[] Urgent Care (no appt availability in office) / [] Appointment(In office/virtual)/ [x]  [place] [facility]/ [] Home Care/ [] Refused Recommended Disposition /[] [place] [facility]/ []  Follow-up with PCP Additional Notes: Patient says she was told that Farxiga  may cause a yeast infection and she's been dealing with it longer. Advised no availability with PCP or any provider until next week in person or virtually. Advised virtual UC, she agreed, scheduled today.   Copied from CRM [id number]. Topic: Clinical - Medication Question >> Oct 25, 2023 11:25 AM Wynona Hedger wrote: Reason for CRM: Patient would like Dr. Lindaann Requena to send in her a prescription for medication for a yeast infection, Patient is having white discharge and itching. Patient can be reached at [phone] Reason for Disposition  [1] Symptoms of a "yeast infection" (i.e., itchy, white discharge, not bad smelling) AND [2] not improved > 3 days following Care Advice  Answer Assessment - Initial Assessment Questions 1. DISCHARGE: "Describe the discharge." (e.g., white, yellow, green, gray, foamy, cottage cheese-like)     White 2. ODOR: "Is there a bad odor?"     No 3. ONSET: "When did the discharge begin?"     This week 4. RASH: "Is there a rash in the genital area?" If Yes, ask: "Describe it." (e.g., redness, blisters, sores, bumps)     No 5. ABDOMEN PAIN: "Are you having any abdomen pain?" If Yes, ask: "What does it feel like? " (e.g., crampy, dull, intermittent, constant)      No 6. ABDOMEN PAIN SEVERITY: If present, ask: "How bad is it?" (e.g., Scale 1-10; mild, moderate, or severe)   - MILD (1-3): Doesn't interfere with normal activities, abdomen soft and not tender to touch.    - MODERATE (4-7): Interferes with normal activities or awakens from sleep, abdomen tender to touch.     - SEVERE (8-10): Excruciating pain, doubled over, unable to do any normal activities. (R/O peritonitis)      No 7. CAUSE: "What do you think is causing the discharge?" "Have you had the same problem before? What happened then?"     Yeast infection 8. OTHER SYMPTOMS: "Do you have any other symptoms?" (e.g., fever, itching, vaginal bleeding, pain with urination, injury to genital area, vaginal foreign body)     Itching  Protocols used: Vaginal Discharge-A-AH

## 2023-10-25 NOTE — Progress Notes (Signed)
 PCP has already treated patient.   Appt not needed.  No Charge.

## 2023-10-25 NOTE — Telephone Encounter (Signed)
 Patient advised.

## 2023-12-06 ENCOUNTER — Ambulatory Visit: Admitting: Physician Assistant

## 2023-12-06 VITALS — BP 116/74 | HR 74 | Ht 65.0 in | Wt 195.0 lb

## 2023-12-06 DIAGNOSIS — E063 Autoimmune thyroiditis: Secondary | ICD-10-CM

## 2023-12-06 DIAGNOSIS — G4730 Sleep apnea, unspecified: Secondary | ICD-10-CM

## 2023-12-06 DIAGNOSIS — N1831 Chronic kidney disease, stage 3a: Secondary | ICD-10-CM | POA: Diagnosis not present

## 2023-12-06 DIAGNOSIS — I10 Essential (primary) hypertension: Secondary | ICD-10-CM | POA: Diagnosis not present

## 2023-12-06 DIAGNOSIS — F900 Attention-deficit hyperactivity disorder, predominantly inattentive type: Secondary | ICD-10-CM | POA: Diagnosis not present

## 2023-12-06 DIAGNOSIS — N760 Acute vaginitis: Secondary | ICD-10-CM

## 2023-12-06 MED ORDER — AMPHETAMINE-DEXTROAMPHET ER 30 MG PO CP24: 30.0000 mg | ORAL_CAPSULE | Freq: Every day | ORAL | 0 refills | Status: DC

## 2023-12-06 MED ORDER — FLUCONAZOLE 150 MG PO TABS: 150.0000 mg | ORAL_TABLET | Freq: Once | ORAL | 0 refills | Status: AC

## 2023-12-06 NOTE — Progress Notes (Signed)
   Established Patient Office Visit  Subjective   Patient ID: Cynthia [name], female    DOB: 04-21-1961  Age: 63 y.o. MRN: [id number]  Chief Complaint  Patient presents with   Medical Management of Chronic Issues    3 mo fup adhd    HPI Charlann is in today for a follow up on ADHD medication. She states she is doing well overall on it but sometimes she feels like it doesn't work. She does not want to adjust dose or switch to another medication. She has tried Vyvanse  in the past which didn't work. She is sleeping and eating well.   She was also placed on Farxiga  3 months ago for CKD. She has had one yeast infection already and thinks she may have another one starting right now. She has noticed increased discharge but denies itching or odor. She denies frequency, urgency, or burning with urination.   ROS    Objective:     BP 116/74   Pulse 74   Ht 5' 5 (1.651 m)   Wt 88.5 kg   LMP 06/18/2006 (Approximate)   SpO2 99%   BMI 32.45 kg/m  {Vitals History (Optional):23777}  Physical Exam   No results found for any visits on 12/06/23.  {Labs (Optional):23779}  The 10-year ASCVD risk score (Arnett DK, et al., 2019) is: 2.8%    Assessment & Plan:   Problem List Items Addressed This Visit       Cardiovascular and Mediastinum   Essential (primary) hypertension - Primary     Respiratory   Mild sleep apnea     Endocrine   Hashimoto's thyroiditis (Chronic)     Genitourinary   CKD (chronic kidney disease) stage 3, GFR 30-59 ml/min (HCC)     Other   ADD (attention deficit disorder)   Continue on Adderall and Farxiga  Sent diflucan  for yeast infection encouraged to wear breathable cotton underwear and change clothes shortly after exercise or sweating Labs for GFR and TSH done today  Follow up in 3 months  BP looks good today continue on Maxzide   No follow-ups on file.    Maylene Spear, Student-PA

## 2023-12-07 LAB — BMP8+EGFR
BUN/Creatinine Ratio: 13 (ref 12–28)
BUN: 15 mg/dL (ref 8–27)
CO2: 24 mmol/L (ref 20–29)
Calcium: 8.9 mg/dL (ref 8.7–10.3)
Chloride: 98 mmol/L (ref 96–106)
Creatinine, Ser: 1.15 mg/dL — ABNORMAL HIGH (ref 0.57–1.00)
Glucose: 91 mg/dL (ref 70–99)
Potassium: 3.1 mmol/L — ABNORMAL LOW (ref 3.5–5.2)
Sodium: 139 mmol/L (ref 134–144)
eGFR: 55 mL/min/{1.73_m2} — ABNORMAL LOW (ref >59)

## 2023-12-07 LAB — TSH+FREE T4
Free T4: 1.63 ng/dL (ref 0.82–1.77)
TSH: 3.5 u[IU]/mL (ref 0.450–4.500)

## 2023-12-09 ENCOUNTER — Encounter: Payer: Self-pay | Admitting: Physician Assistant

## 2023-12-09 ENCOUNTER — Ambulatory Visit: Payer: Self-pay | Admitting: Physician Assistant

## 2023-12-09 DIAGNOSIS — N760 Acute vaginitis: Secondary | ICD-10-CM | POA: Insufficient documentation

## 2023-12-09 DIAGNOSIS — E876 Hypokalemia: Secondary | ICD-10-CM

## 2023-12-09 NOTE — Progress Notes (Signed)
 Tonna,   Thyroid  looks great.  Kidney function stable.  Potassium low which is different. Recheck this week to make sure correct reading if so we need to consider replacement.

## 2023-12-10 ENCOUNTER — Other Ambulatory Visit: Payer: Self-pay | Admitting: Physician Assistant

## 2023-12-10 DIAGNOSIS — N1831 Chronic kidney disease, stage 3a: Secondary | ICD-10-CM

## 2023-12-11 NOTE — Progress Notes (Signed)
Just potassium

## 2023-12-17 LAB — POTASSIUM: Potassium: 3.6 mmol/L (ref 3.5–5.2)

## 2023-12-17 NOTE — Progress Notes (Signed)
 HI Aubrei, potassium looks much better!!

## 2023-12-27 ENCOUNTER — Other Ambulatory Visit: Payer: Self-pay | Admitting: Student

## 2023-12-27 DIAGNOSIS — M5440 Lumbago with sciatica, unspecified side: Secondary | ICD-10-CM

## 2024-01-08 NOTE — Discharge Instructions (Signed)

## 2024-01-09 ENCOUNTER — Ambulatory Visit
Admission: RE | Admit: 2024-01-09 | Discharge: 2024-01-09 | Disposition: A | Discharge status: Home / Self Care | Source: Ambulatory Visit | Attending: Student | Admitting: Student

## 2024-01-09 DIAGNOSIS — M5440 Lumbago with sciatica, unspecified side: Secondary | ICD-10-CM

## 2024-01-09 MED ORDER — METHYLPREDNISOLONE ACETATE 40 MG/ML INJ SUSP (RADIOLOG
80.0000 mg | Freq: Once | INTRAMUSCULAR | Status: AC
  Administered 2024-01-09: 80 mg via EPIDURAL

## 2024-01-09 MED ORDER — IOPAMIDOL (ISOVUE-M 200) INJECTION 41%
1.0000 mL | Freq: Once | INTRAMUSCULAR | Status: AC
  Administered 2024-01-09: 1 mL via EPIDURAL

## 2024-02-18 ENCOUNTER — Encounter: Payer: Self-pay | Admitting: Sports Medicine

## 2024-03-03 ENCOUNTER — Encounter: Payer: Self-pay | Admitting: Physician Assistant

## 2024-03-06 ENCOUNTER — Ambulatory Visit: Admitting: Physician Assistant

## 2024-03-06 ENCOUNTER — Encounter: Payer: Self-pay | Admitting: Physician Assistant

## 2024-03-06 VITALS — BP 116/74 | HR 82 | Temp 98.2°F | Ht 65.0 in | Wt 191.0 lb

## 2024-03-06 DIAGNOSIS — R6889 Other general symptoms and signs: Secondary | ICD-10-CM | POA: Diagnosis not present

## 2024-03-06 DIAGNOSIS — N1831 Chronic kidney disease, stage 3a: Secondary | ICD-10-CM | POA: Diagnosis not present

## 2024-03-06 DIAGNOSIS — E876 Hypokalemia: Secondary | ICD-10-CM

## 2024-03-06 DIAGNOSIS — M545 Low back pain, unspecified: Secondary | ICD-10-CM

## 2024-03-06 DIAGNOSIS — E559 Vitamin D deficiency, unspecified: Secondary | ICD-10-CM | POA: Diagnosis not present

## 2024-03-06 DIAGNOSIS — U071 COVID-19: Secondary | ICD-10-CM | POA: Diagnosis not present

## 2024-03-06 DIAGNOSIS — G8929 Other chronic pain: Secondary | ICD-10-CM

## 2024-03-06 DIAGNOSIS — F900 Attention-deficit hyperactivity disorder, predominantly inattentive type: Secondary | ICD-10-CM

## 2024-03-06 DIAGNOSIS — I10 Essential (primary) hypertension: Secondary | ICD-10-CM

## 2024-03-06 DIAGNOSIS — R454 Irritability and anger: Secondary | ICD-10-CM

## 2024-03-06 DIAGNOSIS — E063 Autoimmune thyroiditis: Secondary | ICD-10-CM

## 2024-03-06 LAB — POC COVID19 BINAXNOW: SARS Coronavirus 2 Ag: POSITIVE — AB

## 2024-03-06 LAB — POCT INFLUENZA A/B
Influenza A, POC: NEGATIVE
Influenza B, POC: NEGATIVE

## 2024-03-06 MED ORDER — NIRMATRELVIR/RITONAVIR (PAXLOVID)TABLET: 3.0000 | ORAL_TABLET | Freq: Two times a day (BID) | ORAL | 0 refills | Status: AC

## 2024-03-06 MED ORDER — AMPHETAMINE-DEXTROAMPHET ER 30 MG PO CP24: 30.0000 mg | ORAL_CAPSULE | Freq: Every day | ORAL | 0 refills | Status: AC

## 2024-03-06 MED ORDER — HYDROCOD POLI-CHLORPHE POLI ER 10-8 MG/5ML PO SUER: 5.0000 mL | Freq: Two times a day (BID) | ORAL | 0 refills | Status: DC | PRN

## 2024-03-06 MED ORDER — LEVOTHYROXINE SODIUM 88 MCG PO TABS: 88.0000 ug | ORAL_TABLET | Freq: Every day | ORAL | 1 refills | Status: AC

## 2024-03-06 MED ORDER — BUPROPION HCL ER (XL) 300 MG PO TB24: 300.0000 mg | ORAL_TABLET | Freq: Every day | ORAL | 3 refills | Status: DC

## 2024-03-06 MED ORDER — CELECOXIB 200 MG PO CAPS: 200.0000 mg | ORAL_CAPSULE | Freq: Two times a day (BID) | ORAL | 3 refills | Status: AC

## 2024-03-06 NOTE — Progress Notes (Signed)
 Established Patient Office Visit  Subjective   Patient ID: Cynthia [name], female    DOB: [birth year]  Age: 63 y.o. MRN: [id number]  Chief Complaint  Patient presents with   Follow-up    Hypertension    Cough    Onset 2-3 days ago   Nasal Congestion    Onset Monday    Sore Throat    HPI Discussed the use of AI scribe software for clinical note transcription with the patient, who gave verbal consent to proceed.  History of Present Illness Cynthia [name] is a 63 year old female who presents for medication refills and symptoms of an upper respiratory infection.  Upper respiratory symptoms - Sore throat, rhinorrhea, cough, and nasal congestion for the past five days - Dry cough began last night, requiring use of asthma inhaler - No fever - Using Mucinex for symptom relief - No other systemic symptoms  Attention deficit hyperactivity disorder (adhd) management - Requires refill of Adderall prescription - Stable on current medication regimen  Electrolyte abnormality - Recent blood work showed slightly low potassium levels - Currently taking triamterene /hydrochlorothiazide - Diet has been suboptimal due to recent illness  Vitamin d  deficiency - Low vitamin D  levels despite taking 2000 IU daily  Recent travel and associated events - Returned from a trip to [place] 18 days ago - No illness among travel companions, including immunocompromised brother - Lost a hearing aid during the trip  Musculoskeletal injury - Sustained a broken toe prior to travel - Placed in a walking boot for the injury -she has ongoing chronic back pain that she uses celebrex  for, needs refills.    ROS See HPI.    Objective:     BP 116/74 (BP Location: Left Arm, Patient Position: Sitting, Cuff Size: Normal)   Pulse 82   Temp 98.2 F (36.8 C) (Oral)   Ht 5' 5 (1.651 m)   Wt 191 lb (86.6 kg)   LMP 06/18/2006 (Approximate)   SpO2 98%   BMI 31.78 kg/m  BP Readings from Last 3 Encounters:   03/06/24 116/74  01/09/24 (!) 144/55  12/06/23 116/74   Wt Readings from Last 3 Encounters:  03/06/24 191 lb (86.6 kg)  12/06/23 195 lb (88.5 kg)  09/06/23 195 lb 12 oz (88.8 kg)    .SABRA Results for orders placed or performed in visit on 03/06/24  POC COVID-19   Collection Time: 03/06/24  9:21 AM  Result Value Ref Range   SARS Coronavirus 2 Ag Positive (A) Negative  POCT Influenza A/B   Collection Time: 03/06/24  9:22 AM  Result Value Ref Range   Influenza A, POC Negative Negative   Influenza B, POC Negative Negative     Physical Exam Constitutional:      Appearance: She is ill-appearing.  HENT:     Head: Normocephalic.     Right Ear: Ear canal normal. A middle ear effusion is present.     Left Ear: Ear canal normal. A middle ear effusion is present.     Nose: Congestion present.     Mouth/Throat:     Mouth: Mucous membranes are moist.     Pharynx: Uvula midline. Uvula swelling present. No oropharyngeal exudate.     Tonsils: No tonsillar exudate or tonsillar abscesses.  Cardiovascular:     Rate and Rhythm: Normal rate and regular rhythm.  Pulmonary:     Effort: Pulmonary effort is normal.     Breath sounds: Normal breath sounds.  Neurological:  Mental Status: She is alert.  Psychiatric:        Mood and Affect: Mood normal.        The 10-year ASCVD risk score (Arnett DK, et al., 2019) is: 3.2%    Assessment & Plan:  SABRASABRALoris was seen today for follow-up, cough, nasal congestion and sore throat.  Diagnoses and all orders for this visit:  COVID-19 virus infection -     nirmatrelvir /ritonavir  (PAXLOVID ) 20 x 150 MG & 10 x 100MG  TABS; Take 3 tablets by mouth 2 (two) times daily for 5 days.  Hypokalemia -     CMP14+EGFR  Vitamin D  deficiency  Stage 3a chronic kidney disease (HCC) -     CMP14+EGFR  Essential (primary) hypertension -     CMP14+EGFR  Attention deficit hyperactivity disorder (ADHD), predominantly inattentive type -      amphetamine -dextroamphetamine (ADDERALL XR) 30 MG 24 hr capsule; Take 1 capsule (30 mg total) by mouth daily. -     amphetamine -dextroamphetamine (ADDERALL XR) 30 MG 24 hr capsule; Take 1 capsule (30 mg total) by mouth daily. -     amphetamine -dextroamphetamine (ADDERALL XR) 30 MG 24 hr capsule; Take 1 capsule (30 mg total) by mouth daily.  Irritable -     buPROPion  (WELLBUTRIN  XL) 300 MG 24 hr tablet; Take 1 tablet (300 mg total) by mouth daily.  Hashimoto's thyroiditis -     levothyroxine  (SYNTHROID ) 88 MCG tablet; Take 1 tablet (88 mcg total) by mouth daily.  Chronic left-sided low back pain without sciatica -     celecoxib  (CELEBREX ) 200 MG capsule; Take 1 capsule (200 mg total) by mouth 2 (two) times daily.  Flu-like symptoms -     POCT Influenza A/B -     POC COVID-19  Other orders -     chlorpheniramine-HYDROcodone  (TUSSIONEX) 10-8 MG/5ML; Take 5 mLs by mouth every 12 (twelve) hours as needed.   Assessment and Plan Assessment & Plan Attention-deficit hyperactivity disorder (ADHD) ADHD is well-managed with current medication regimen. - Refill Adderall prescription.  COVID-19 infection Acute COVID-19 infection confirmed. Symptoms include sore throat, runny nose, dry cough, and congestion. No fever. Concern for potential worsening due to asthma. - Prescribe Paxlovid  for COVID-19 treatment. - Prescribe cough syrup for symptom relief. - Advise rest and hydration. - Continue Mucinex for symptom management. - Advise husband to get tested for COVID-19, especially before visiting vulnerable family members.  Hypokalemia Recent lab results indicate low potassium levels. Triamterene  hydrochlorothiazide and recent illness with poor dietary intake may contribute. Discussed supplementation options; she prefers over-the-counter. - Recommend over-the-counter potassium supplement. - Order repeat potassium level check in two weeks.  Vitamin D  deficiency Persistent deficiency despite 2000  IU daily supplementation. Discussed increasing dose and absorption strategies. - Increase vitamin D  supplementation to 5000 IU daily. - Advise taking vitamin D  with a fat-containing meal to enhance absorption. - Consider switching to vitamin D3 K2 supplement for better absorption.  Asthma Asthma is well-controlled but requires inhaler use due to recent dry cough from COVID-19 infection. - Ensure asthma inhaler is available for use as needed.  CKD-3a -needs labs to monitor  Chronic low back pain -refilled celebrex      Return in about 6 months (around 09/03/2024).    Kanya Potteiger, PA-C

## 2024-03-06 NOTE — Patient Instructions (Signed)
 COVID-19: What to Know COVID-19 is an infection caused by a virus called SARS-CoV-2. This type of virus is called a coronavirus. People with COVID-19 may: Have few to no symptoms. Have mild to moderate symptoms that affect their lungs and breathing. Get very sick. What are the causes?  COVID-19 is caused by a virus. This virus may be in the air as droplets or on surfaces. It can spread from an infected person when they cough, sneeze, speak, sing, or breathe. You may become infected if: You breathe in the infected droplets in the air. You touch an object that has the virus on it. What increases the risk? You are at risk of getting COVID-19 if you have been around someone with the infection. You may be more likely to get very sick if: You are 57 years old or older. You have certain medical conditions, such as: Heart disease. Diabetes. Long-term respiratory disease. Cancer. Pregnancy. You are immunocompromised. This means your body can't fight infections easily. You have a disability that makes it hard for you to move around, you have trouble moving, or you can't move at all. What are the signs or symptoms? People may have different symptoms from COVID-19. The symptoms can also be mild to very bad. They often show up in 5-6 days after being infected. But, they can take up to 14 days to appear. Common symptoms are: Cough. Feeling tired. New loss of taste or smell. Fever. Less common symptoms are: Sore throat. Headache. Body or muscle aches. Diarrhea. A skin rash or fingers or toes that are a different color than usual. Red or irritated eyes. Sometimes, COVID-19 does not cause symptoms. How is this diagnosed? COVID-19 can be diagnosed with tests done in the lab or at home. Fluid from your nose, mouth, or lungs will be used to check for the virus. How is this treated? Treatment for COVID-19 depends on how sick you are. Mild symptoms can be treated at home with rest, fluids, and  over-the-counter medicines. very bad symptoms may be treated in a hospital intensive care unit (ICU). If you have symptoms and are at risk of getting very sick, you may be given a medicine that fights viruses. This medicine is called an antiviral. How is this prevented? To protect yourself from COVID-19: Know your risk factors. Get vaccinated. If your body can't fight infections easily, talk to your provider about treatment to help prevent COVID-19. Stay at least about 3 feet (1 meter) away from other people. Wear mask that fits well when: You can't stay at a distance from people. You're in a place with not a lot of air flow. Try to be in open spaces with good air flow when you are in public. Wash your hands often or use an alcohol-based hand sanitizer. Cover your nose and mouth when you cough or sneeze. If you think you have COVID-19 or have been around someone who has it, stay home and away from other people as told by your provider or health officials. Where to find more information To learn more: Go to [employer].no Click Health Topics. Type COVID-19 in the search box. Go to VisitDestination.com.br Click Health Topics. Then click All Topics. Type COVID-19 in the search box. Get help right away if: You have trouble breathing or get short of breath. You have pain or pressure in your chest. You're feeling confused. These symptoms may be an emergency. Get help right away. Call 911. Do not wait to see if the symptoms will go away.  Do not drive yourself to the hospital. This information is not intended to replace advice given to you by your health care provider. Make sure you discuss any questions you have with your health care provider. Document Revised: 03/07/2023 Document Reviewed: 02/27/2023 Elsevier Patient Education  2025 [employer].

## 2024-03-25 ENCOUNTER — Other Ambulatory Visit: Payer: Self-pay | Admitting: Physician Assistant

## 2024-03-25 DIAGNOSIS — R454 Irritability and anger: Secondary | ICD-10-CM

## 2024-03-31 ENCOUNTER — Encounter (HOSPITAL_BASED_OUTPATIENT_CLINIC_OR_DEPARTMENT_OTHER): Payer: Self-pay | Admitting: Obstetrics & Gynecology

## 2024-03-31 ENCOUNTER — Ambulatory Visit (HOSPITAL_BASED_OUTPATIENT_CLINIC_OR_DEPARTMENT_OTHER): Admitting: Obstetrics & Gynecology

## 2024-03-31 VITALS — BP 119/77 | HR 82 | Wt 192.0 lb

## 2024-03-31 DIAGNOSIS — N9089 Other specified noninflammatory disorders of vulva and perineum: Secondary | ICD-10-CM

## 2024-03-31 NOTE — Progress Notes (Signed)
   GYNECOLOGY  VISIT  CC:   Follow-up (Pelvic pain and bump on vagina. )   HPI: 63 y.o. G0P0000 Married White or Caucasian female here for concerns about lesion on vulva that was larger earlier this week.  No drainage.  There was tenderness.  Not sure if anything needs to be done at this point.  Patient's last menstrual period was 06/18/2006 (approximate).   MEDS:  reviewed in EPIC  ALLERGIES: Ozempic (0.25 or 0.5 mg-dose) [semaglutide (0.25 or 0.5mg -dos)], Mobic [meloxicam], and Multivitamins  SH:  married, non smoker  Review of Systems  Constitutional: Negative.   Genitourinary: Negative.     PHYSICAL EXAMINATION:    BP 119/77 (BP Location: Left Arm, Patient Position: Sitting, Cuff Size: Large)   Pulse 82   Wt 192 lb (87.1 kg)   LMP 06/18/2006 (Approximate)   SpO2 100%   BMI 31.95 kg/m     General appearance: alert, cooperative and appears stated age Lymph:  no inguinal LAD noted  Pelvic: External genitalia:  mildly erythematous lesion on right external labia majora, non tender with evidence of healing, no fluctuance              Urethra:  normal appearing urethra with no masses, tenderness or lesions              Bartholins and Skenes: normal     Chaperone was present for exam.  Assessment/Plan: 1. Vulvar lesion (Primary) - no evidence today that antibiotics are needed as this is improving.  Recommended continued monitoring and to call with any new questions/concerns/worsening symptoms.

## 2024-04-15 ENCOUNTER — Ambulatory Visit: Admitting: Physician Assistant

## 2024-04-15 VITALS — BP 107/87 | HR 80 | Ht 65.0 in | Wt 195.0 lb

## 2024-04-15 DIAGNOSIS — R42 Dizziness and giddiness: Secondary | ICD-10-CM | POA: Diagnosis not present

## 2024-04-15 DIAGNOSIS — Z23 Encounter for immunization: Secondary | ICD-10-CM

## 2024-04-15 DIAGNOSIS — D333 Benign neoplasm of cranial nerves: Secondary | ICD-10-CM

## 2024-04-15 DIAGNOSIS — M5136 Other intervertebral disc degeneration, lumbar region with discogenic back pain only: Secondary | ICD-10-CM | POA: Diagnosis not present

## 2024-04-15 DIAGNOSIS — Z86018 Personal history of other benign neoplasm: Secondary | ICD-10-CM | POA: Diagnosis not present

## 2024-04-15 MED ORDER — KETOROLAC TROMETHAMINE 60 MG/2ML IM SOLN
60.0000 mg | Freq: Once | INTRAMUSCULAR | Status: AC
  Administered 2024-04-15: 60 mg via INTRAMUSCULAR

## 2024-04-15 NOTE — Patient Instructions (Signed)
 [facility] referral Missy for massage therapy Start using tens unit

## 2024-04-17 ENCOUNTER — Encounter: Payer: Self-pay | Admitting: Physician Assistant

## 2024-04-17 NOTE — Progress Notes (Signed)
 Acute Office Visit  Subjective:     Patient ID: Cynthia [name], female    DOB: September 06, 1960, 63 y.o.   MRN: [id number]  Chief Complaint  Patient presents with   Back Pain    HPI .SABRADiscussed the use of AI scribe software for clinical note transcription with the patient, who gave verbal consent to proceed.  History of Present Illness Cynthia [name] is a 63 year old female with significant lumbar disc degeneration who presents with worsening low back pain.  Low back pain - Worsening low back pain since last Friday after bending over to pick up her 18-pound dog - Pain described as a pulling sensation localized to the left side of the lower back - No radiation of pain down the leg - Pain occurs periodically - Difficulty straightening up due to pain - History of significant lumbar disc degeneration - Previous lumbar spine injections - Last lumbar spine MRI in 2024 - Current management includes Flexeril , Celebrex , topical patches, and heating pad with some relief - No use of tramadol  for pain management  Vertigo - History of vertigo following Paxlovid  treatment for COVID-19 - Vertigo triggered by bending over - Underwent Epley maneuver twice - Participating in vestibular physical therapy - Concern for possible regrowth of acoustic neuroma - Unable to find satisfactory local ENT; has appointment at [facility] in February - Uses meclizine  for vertigo as needed    ROS See HPI.     Objective:    BP 107/87   Pulse 80   Ht 5' 5 (1.651 m)   Wt 195 lb (88.5 kg)   LMP 06/18/2006 (Approximate)   SpO2 99%   BMI 32.45 kg/m  BP Readings from Last 3 Encounters:  04/15/24 107/87  03/31/24 119/77  03/06/24 116/74   Wt Readings from Last 3 Encounters:  04/15/24 195 lb (88.5 kg)  03/31/24 192 lb (87.1 kg)  03/06/24 191 lb (86.6 kg)      Physical Exam Constitutional:      Appearance: Normal appearance. She is obese.  HENT:     Head: Normocephalic.     Right Ear: Tympanic membrane,  ear canal and external ear normal. There is no impacted cerumen.     Left Ear: Tympanic membrane, ear canal and external ear normal. There is no impacted cerumen.     Nose: Nose normal.  Cardiovascular:     Rate and Rhythm: Normal rate and regular rhythm.  Pulmonary:     Effort: Pulmonary effort is normal.     Breath sounds: Normal breath sounds.  Musculoskeletal:     Right lower leg: No edema.     Left lower leg: No edema.     Comments: No lumbar spinal tenderness to palpation.  Tenderness to palpation of the left paraspinal muscles to palpation.  5/5 lower extremity strength Negative SLR, bilaterally.   Neurological:     General: No focal deficit present.     Mental Status: She is alert and oriented to person, place, and time.  Psychiatric:        Mood and Affect: Mood normal.         Assessment & Plan:  SABRASABRAShandricka was seen today for back pain.  Diagnoses and all orders for this visit:  Degeneration of intervertebral disc of lumbar region with discogenic back pain -     ketorolac  (TORADOL ) injection 60 mg  Immunization due -     Flu vaccine trivalent PF, 6mos and older(Flulaval,Afluria,Fluarix,Fluzone)  History of acoustic neuroma -  Ambulatory referral to ENT  Vertigo -     Ambulatory referral to ENT   Assessment & Plan Chronic left-sided low back pain due to lumbar intervertebral disc degeneration No red flags today Worsening pain after bending, localized without radiation, indicating muscular involvement. Previous MRI showed no significant spinal canal encroachment. - Administer Toradol  injection for acute pain. - Refer to massage therapist Missy Renuart for muscle tightness relief. - Encourage use of TENS unit before stretching exercises. - Advise continuation of Flexeril  as needed. - Recommend stretching exercises, particularly child's pose.  Left acoustic neuroma with worsening dizziness - new referral for ENT - continue vestibular therapy - continue  antivert  as needed  Encounter for immunization Due for flu shot with no contraindications. - Administer flu shot.      Return if symptoms worsen or fail to improve.  [name], PA-C

## 2024-05-06 ENCOUNTER — Other Ambulatory Visit: Payer: Self-pay | Admitting: Physician Assistant

## 2024-05-06 DIAGNOSIS — I10 Essential (primary) hypertension: Secondary | ICD-10-CM

## 2024-05-10 ENCOUNTER — Other Ambulatory Visit: Payer: Self-pay | Admitting: Physician Assistant

## 2024-05-10 DIAGNOSIS — G8929 Other chronic pain: Secondary | ICD-10-CM

## 2024-06-30 ENCOUNTER — Encounter: Payer: Self-pay | Admitting: Physician Assistant

## 2024-06-30 ENCOUNTER — Ambulatory Visit: Payer: Self-pay | Admitting: Physician Assistant

## 2024-06-30 ENCOUNTER — Ambulatory Visit: Admitting: Physician Assistant

## 2024-06-30 ENCOUNTER — Ambulatory Visit

## 2024-06-30 VITALS — BP 135/80 | HR 76 | Ht 65.0 in | Wt 196.0 lb

## 2024-06-30 DIAGNOSIS — R5383 Other fatigue: Secondary | ICD-10-CM | POA: Diagnosis not present

## 2024-06-30 DIAGNOSIS — Z8781 Personal history of (healed) traumatic fracture: Secondary | ICD-10-CM | POA: Insufficient documentation

## 2024-06-30 DIAGNOSIS — S022XXA Fracture of nasal bones, initial encounter for closed fracture: Secondary | ICD-10-CM | POA: Diagnosis not present

## 2024-06-30 DIAGNOSIS — E063 Autoimmune thyroiditis: Secondary | ICD-10-CM | POA: Diagnosis not present

## 2024-06-30 DIAGNOSIS — D508 Other iron deficiency anemias: Secondary | ICD-10-CM | POA: Diagnosis not present

## 2024-06-30 DIAGNOSIS — E559 Vitamin D deficiency, unspecified: Secondary | ICD-10-CM | POA: Insufficient documentation

## 2024-06-30 DIAGNOSIS — S0992XA Unspecified injury of nose, initial encounter: Secondary | ICD-10-CM | POA: Insufficient documentation

## 2024-06-30 DIAGNOSIS — E876 Hypokalemia: Secondary | ICD-10-CM

## 2024-06-30 NOTE — Progress Notes (Signed)
 Non-displaced fracture of your nose! It may ache and hurt for a little while but no other intervention is needed.

## 2024-06-30 NOTE — Progress Notes (Unsigned)
 "  Acute Office Visit  Subjective:     Patient ID: Cynthia [name], female    DOB: Oct 02, 1960, 64 y.o.   MRN: [id number]  Chief Complaint  Patient presents with   Facial Injury    HPI Discussed the use of AI scribe software for clinical note transcription with the patient, who gave verbal consent to proceed.  History of Present Illness Cynthia [name] is a 64 year old female who presents with nasal trauma and concerns about thyroid  function.  Nasal trauma - Sustained nasal trauma after bending head down during a workout, causing glasses to strike nose about one week ago - Immediate pain and tearing followed by significant swelling the next day - Intermittent epistaxis noted after injury - Swelling exhibited a yellowish discoloration at one point - No difficulty breathing - History of nasal fracture in youth requiring hospitalization and surgical intervention  Thyroid  dysfunction symptoms - Increased fatigue - Subjective sensation of feeling 'weird again' - Increased hair loss compared to baseline - Uncertainty if hair loss is related to thyroid  dysfunction  Vertiginous sensations and acoustic neuroma history - History of acoustic neuroma - Persistent abnormal sensation when turning head, associated with previous vertigo episodes - Upcoming evaluation at [facility] for further assessment of symptoms      ROS See HPI.      Objective:    BP (!) 156/96   Pulse 76   Ht 5' 5 (1.651 m)   Wt 196 lb (88.9 kg)   LMP 06/18/2006   SpO2 97%   BMI 32.62 kg/m  BP Readings from Last 3 Encounters:  06/30/24 (!) 156/96  04/15/24 107/87  03/31/24 119/77   Wt Readings from Last 3 Encounters:  06/30/24 196 lb (88.9 kg)  04/15/24 195 lb (88.5 kg)  03/31/24 192 lb (87.1 kg)      Physical Exam Constitutional:      Appearance: Normal appearance.  HENT:     Head:     Comments: Right nasal bridge with superficial abrasion and surrounding redness with tenderness to palpation. Swelling  minimal.     Nose:     Comments: Swollen right sided turbinates.  Cardiovascular:     Rate and Rhythm: Normal rate.  Pulmonary:     Effort: Pulmonary effort is normal.  Musculoskeletal:     Cervical back: No tenderness.  Lymphadenopathy:     Cervical: No cervical adenopathy.  Neurological:     General: No focal deficit present.     Mental Status: She is alert and oriented to person, place, and time.  Psychiatric:        Mood and Affect: Mood normal.          Assessment & Plan:  .Cynthia [name] was seen today for facial injury.  Diagnoses and all orders for this visit:  Injury of nose, initial encounter -     DG Nasal Bones; Future  History of fracture of nasal bone -     DG Nasal Bones; Future  Hashimoto's thyroiditis -     TSH + free T4  Iron deficiency anemia secondary to inadequate dietary iron intake -     CBC w/Diff/Platelet -     Fe+TIBC+Fer  Hypokalemia -     CMP14+EGFR  No energy -     TSH + free T4 -     CMP14+EGFR -     CBC w/Diff/Platelet -     Fe+TIBC+Fer -     VITAMIN D  25 Hydroxy (Vit-D Deficiency, Fractures) -  B12 and Folate Panel  Vitamin D  deficiency -     VITAMIN D  25 Hydroxy (Vit-D Deficiency, Fractures)    Assessment & Plan Nasal injury with possible fracture Nasal injury with swelling and epistaxis. Possible hairline fracture suspected. X-ray warranted for confirmation. - Ordered nasal bridge x-ray and confirmed nasal non displaced fracture.  - Advised use of ice packs to reduce swelling. - Instructed to avoid picking at the nose to prevent infection. - Continue tylenol  and ibuprofen for pain control - Reassured that no intervention is needed  Hashimotos and on medication/Hx of IDA and vitamin D  deficiency Fatigue and increased hair loss suggest thyroid  dysfunction. - Ordered thyroid  function tests and vitamin levels.  - Ordered hemoglobin and potassium levels.  Acoustic neuroma with vertigo Persistent vertigo and dizziness,  especially with head movement. - Continue with scheduled appointment on February 10th.    Caige Almeda, PA-C   "

## 2024-06-30 NOTE — Patient Instructions (Signed)
 Broken Bone in the Nose (Nasal Fracture): What to Know A fracture is a break in a bone. A nasal fracture is a broken nose. Pegg breaks don't need treatment. They often heal on their own in about a month. Serious breaks may need treatment. Sometimes, surgery is needed. What are the causes? A broken nose is usually caused by a direct hit to the nose. This often occurs from: Playing a contact sport. Being in a car accident. Falling. Getting punched in the face. What are the signs or symptoms? Pain. Swelling of the nose. Bleeding from the nose. Bruises around the nose or eyes, including black eyes. The nose having a crooked shape. How is this diagnosed? A broken nose may be diagnosed based on a physical exam. During the exam, the health care provider will: Gently feel the nose for signs of broken bones. Look inside the nostrils to check for swelling on the septum, which is the dividing wall between the nostrils. An X-ray of the nose may be taken. Sometimes, an X-ray may not show a broken nose even if there is one. X-rays or a CT scan may be taken again 1-5 days later after the swelling has gone down. How is this treated? Treatment depends on how bad the injury is. Becka breaks may heal on their own. They often don't need treatment. For more serious breaks that have caused bones to move out of position, treatment may include: Numbing the nose area with medicines and moving the bones back into position without surgery. Your provider may be able to do this in their office. Surgery. If this is needed, it will be done after the swelling is gone. Follow these instructions at home: Activity Ask what things are safe for you to do at home. Ask when you can go back to work or school. Do not play contact sports for 3-4 weeks or as told by your provider. Managing pain and swelling  Use ice or an ice pack as told. Place a towel between your skin and the ice. Leave the ice on for 20 minutes, 2-3  times a day. If your skin turns red, take off the ice right away to prevent skin damage. The risk of damage is higher if you can't feel pain, heat, or cold. General instructions  Take medicines only as told. If your nose bleeds, sit up and lean slightly forward while you gently squeeze your nose shut for 10 minutes. Try to avoid blowing your nose. Contact a health care provider if: You have more pain or very bad pain. You keep having nosebleeds. The shape of your nose doesn't return to normal after 5 days. You have pus coming out of your nose. Get help right away if: Your nose bleeds for more than 20 minutes. You have clear fluid draining out of your nose. You have a swelling on the inside of your nose that doesn't get better. You have trouble moving your eyes. You keep throwing up. These symptoms may be an emergency. Call 911 right away. Do not wait to see if the symptoms will go away. Do not drive yourself to the hospital. This information is not intended to replace advice given to you by your health care provider. Make sure you discuss any questions you have with your health care provider. Document Revised: 08/15/2023 Document Reviewed: 08/15/2023 Elsevier Patient Education  2025 [employer].

## 2024-07-01 ENCOUNTER — Encounter: Payer: Self-pay | Admitting: Physician Assistant

## 2024-07-01 DIAGNOSIS — S022XXA Fracture of nasal bones, initial encounter for closed fracture: Secondary | ICD-10-CM | POA: Insufficient documentation

## 2024-07-01 LAB — CBC WITH DIFFERENTIAL/PLATELET
Basophils Absolute: 0 x10E3/uL (ref 0.0–0.2)
Basos: 0 %
EOS (ABSOLUTE): 0.2 x10E3/uL (ref 0.0–0.4)
Eos: 3 %
Hematocrit: 42 % (ref 34.0–46.6)
Hemoglobin: 14.4 g/dL (ref 11.1–15.9)
Immature Grans (Abs): 0 x10E3/uL (ref 0.0–0.1)
Immature Granulocytes: 0 %
Lymphocytes Absolute: 1.4 x10E3/uL (ref 0.7–3.1)
Lymphs: 24 %
MCH: 29.2 pg (ref 26.6–33.0)
MCHC: 34.3 g/dL (ref 31.5–35.7)
MCV: 85 fL (ref 79–97)
Monocytes Absolute: 0.4 x10E3/uL (ref 0.1–0.9)
Monocytes: 6 %
Neutrophils Absolute: 4.1 x10E3/uL (ref 1.4–7.0)
Neutrophils: 66 %
Platelets: 200 x10E3/uL (ref 150–450)
RBC: 4.93 x10E6/uL (ref 3.77–5.28)
RDW: 12.3 % (ref 11.7–15.4)
WBC: 6.1 x10E3/uL (ref 3.4–10.8)

## 2024-07-01 LAB — CMP14+EGFR
ALT: 16 IU/L (ref 0–32)
AST: 23 IU/L (ref 0–40)
Albumin: 4.6 g/dL (ref 3.9–4.9)
Alkaline Phosphatase: 75 IU/L (ref 49–135)
BUN/Creatinine Ratio: 15 (ref 12–28)
BUN: 16 mg/dL (ref 8–27)
Bilirubin Total: 0.7 mg/dL (ref 0.0–1.2)
CO2: 23 mmol/L (ref 20–29)
Calcium: 9.2 mg/dL (ref 8.7–10.3)
Chloride: 101 mmol/L (ref 96–106)
Creatinine, Ser: 1.06 mg/dL — ABNORMAL HIGH (ref 0.57–1.00)
Globulin, Total: 2.3 g/dL (ref 1.5–4.5)
Glucose: 87 mg/dL (ref 70–99)
Potassium: 3.7 mmol/L (ref 3.5–5.2)
Sodium: 141 mmol/L (ref 134–144)
Total Protein: 6.9 g/dL (ref 6.0–8.5)
eGFR: 60 mL/min/1.73

## 2024-07-01 LAB — B12 AND FOLATE PANEL
Folate: 13.4 ng/mL
Vitamin B-12: 464 pg/mL (ref 232–1245)

## 2024-07-01 LAB — VITAMIN D 25 HYDROXY (VIT D DEFICIENCY, FRACTURES): Vit D, 25-Hydroxy: 43.1 ng/mL (ref 30.0–100.0)

## 2024-07-01 LAB — IRON,TIBC AND FERRITIN PANEL
Ferritin: 17 ng/mL (ref 15–150)
Iron Saturation: 10 % — ABNORMAL LOW (ref 15–55)
Iron: 33 ug/dL (ref 27–139)
Total Iron Binding Capacity: 342 ug/dL (ref 250–450)
UIBC: 309 ug/dL (ref 118–369)

## 2024-07-01 LAB — TSH+FREE T4
Free T4: 1.67 ng/dL (ref 0.82–1.77)
TSH: 4.01 u[IU]/mL (ref 0.450–4.500)

## 2024-07-01 MED ORDER — FUSION PLUS PO CAPS: 1.0000 | ORAL_CAPSULE | Freq: Every day | ORAL | 11 refills | Status: AC

## 2024-07-01 NOTE — Progress Notes (Signed)
 Cynthia [name],   B12 and folate normal ranges.  Vitamin d  looks good.  Normal hemoglobin.  Normal thyroid .  GFR stable around 60.  Iron stores and serum iron and iron saturation are on the low end. Which could be contributing to fatigue. Are you taking any iron supplements right now?

## 2024-07-05 ENCOUNTER — Encounter: Payer: Self-pay | Admitting: Physician Assistant

## 2024-07-05 DIAGNOSIS — F900 Attention-deficit hyperactivity disorder, predominantly inattentive type: Secondary | ICD-10-CM

## 2024-07-06 MED ORDER — AMPHETAMINE-DEXTROAMPHET ER 30 MG PO CP24: 30.0000 mg | ORAL_CAPSULE | Freq: Every day | ORAL | 0 refills | Status: AC

## 2024-07-06 NOTE — Telephone Encounter (Signed)
 Requesting rx rf of Adderall XR 30mg   Last written 05/05/2024 Last OV 06/30/2024 Upcoming appt 09/04/2024

## 2024-09-04 ENCOUNTER — Ambulatory Visit: Admitting: Physician Assistant
